# Patient Record
Sex: Male | Born: 1935
Health system: Southern US, Community
[De-identification: ages and names within clinical notes are randomized; demographics above are authoritative.]

## PROBLEM LIST (undated history)

## (undated) DIAGNOSIS — M1990 Unspecified osteoarthritis, unspecified site: Secondary | ICD-10-CM

## (undated) DIAGNOSIS — I495 Sick sinus syndrome: Secondary | ICD-10-CM

## (undated) DIAGNOSIS — IMO0002 Reserved for concepts with insufficient information to code with codable children: Secondary | ICD-10-CM

## (undated) DIAGNOSIS — I809 Phlebitis and thrombophlebitis of unspecified site: Secondary | ICD-10-CM

## (undated) DIAGNOSIS — R351 Nocturia: Secondary | ICD-10-CM

## (undated) DIAGNOSIS — I4719 Other supraventricular tachycardia: Secondary | ICD-10-CM

## (undated) DIAGNOSIS — M4800 Spinal stenosis, site unspecified: Secondary | ICD-10-CM

## (undated) DIAGNOSIS — M419 Scoliosis, unspecified: Secondary | ICD-10-CM

## (undated) DIAGNOSIS — I471 Supraventricular tachycardia: Secondary | ICD-10-CM

## (undated) DIAGNOSIS — I480 Paroxysmal atrial fibrillation: Secondary | ICD-10-CM

## (undated) DIAGNOSIS — K219 Gastro-esophageal reflux disease without esophagitis: Secondary | ICD-10-CM

## (undated) HISTORY — DX: Nocturia: R35.1

## (undated) HISTORY — DX: Phlebitis and thrombophlebitis of unspecified site: I80.9

## (undated) HISTORY — DX: Spinal stenosis, site unspecified: M48.00

## (undated) HISTORY — DX: Gastro-esophageal reflux disease without esophagitis: K21.9

## (undated) HISTORY — DX: Reserved for concepts with insufficient information to code with codable children: IMO0002

## (undated) HISTORY — DX: Scoliosis, unspecified: M41.9

## (undated) HISTORY — DX: Paroxysmal atrial fibrillation: I48.0

## (undated) HISTORY — DX: Sick sinus syndrome: I49.5

## (undated) HISTORY — DX: Other supraventricular tachycardia: I47.19

## (undated) HISTORY — DX: Supraventricular tachycardia: I47.1

## (undated) HISTORY — DX: Unspecified osteoarthritis, unspecified site: M19.90

---

## 1990-09-08 HISTORY — PX: INGUINAL HERNIA REPAIR: SHX194

## 2006-04-21 ENCOUNTER — Inpatient Hospital Stay: Payer: Self-pay | Admitting: General Practice

## 2006-04-26 ENCOUNTER — Encounter: Payer: Self-pay | Admitting: Internal Medicine

## 2006-05-01 ENCOUNTER — Emergency Department: Payer: Self-pay | Admitting: Emergency Medicine

## 2006-05-09 ENCOUNTER — Encounter: Payer: Self-pay | Admitting: Internal Medicine

## 2006-06-08 ENCOUNTER — Encounter: Payer: Self-pay | Admitting: Internal Medicine

## 2006-07-09 ENCOUNTER — Encounter: Payer: Self-pay | Admitting: Internal Medicine

## 2007-07-29 ENCOUNTER — Emergency Department (HOSPITAL_COMMUNITY): Admission: EM | Admit: 2007-07-29 | Discharge: 2007-07-29 | Payer: Self-pay | Admitting: Emergency Medicine

## 2010-04-05 ENCOUNTER — Ambulatory Visit: Payer: Self-pay | Admitting: Cardiology

## 2010-04-06 ENCOUNTER — Inpatient Hospital Stay (HOSPITAL_COMMUNITY): Admission: EM | Admit: 2010-04-06 | Discharge: 2010-04-10 | Payer: Self-pay | Admitting: Emergency Medicine

## 2010-04-07 ENCOUNTER — Encounter: Payer: Self-pay | Admitting: Cardiology

## 2010-04-08 ENCOUNTER — Ambulatory Visit: Payer: Self-pay | Admitting: Cardiology

## 2010-04-08 HISTORY — PX: PACEMAKER INSERTION: SHX728

## 2010-04-22 ENCOUNTER — Ambulatory Visit: Payer: Self-pay | Admitting: Cardiology

## 2010-05-24 ENCOUNTER — Ambulatory Visit: Payer: Self-pay | Admitting: Cardiovascular Disease

## 2010-07-06 ENCOUNTER — Encounter: Payer: Self-pay | Admitting: Internal Medicine

## 2010-08-07 DIAGNOSIS — I495 Sick sinus syndrome: Secondary | ICD-10-CM | POA: Insufficient documentation

## 2010-08-07 DIAGNOSIS — I4891 Unspecified atrial fibrillation: Secondary | ICD-10-CM | POA: Insufficient documentation

## 2010-08-27 ENCOUNTER — Ambulatory Visit: Payer: Self-pay | Admitting: Cardiology

## 2010-10-08 NOTE — Miscellaneous (Signed)
Summary: Device preload  Clinical Lists Changes  Observations: Added new observation of PPM INDICATN: Sick sinus syndrome (03-26-2010 11:57) Added new observation of MAGNET RTE: BOL 85 ERI 65 (03-26-2010 11:57) Added new observation of PPMLEADSTAT2: active (03-26-2010 11:57) Added new observation of PPMLEADSER2: [id number] (03-26-2010 11:57) Added new observation of PPMLEADMOD2: 5086  (03-26-2010 11:57) Added new observation of PPMLEADDOI2: 04/09/2010  (03-26-2010 11:57) Added new observation of PPMLEADLOC2: RV  (03-26-2010 11:57) Added new observation of PPMLEADSTAT1: active  (03-26-2010 11:57) Added new observation of PPMLEADSER1: [id number]  (03-26-2010 11:57) Added new observation of PPMLEADMOD1: 5086  (03-26-2010 11:57) Added new observation of PPMLEADDOI1: 04/09/2010  (03-26-2010 11:57) Added new observation of PPMLEADLOC1: RA  (03-26-2010 11:57) Added new observation of PPM DOI: 04/09/2010  (03-26-2010 11:57) Added new observation of PPM SERL#: [id number]  (03-26-2010 11:57) Added new observation of PPM MODL#: [id number]  (03-26-2010 02:54) Added new observation of PACEMAKERMFG: Medtronic  (03-26-2010 11:57) Added new observation of PPM IMP MD: Duffy Rhody Tennant,MD  (03-26-2010 11:57) Added new observation of PPM REFER MD: Rolla Plate  (03-26-2010 11:57) Added new observation of PACEMAKER MD: Hillis Range, MD  (03-26-2010 11:57)      PPM Specifications Following MD:  Hillis Range, MD     Referring MD:  Rolla Plate PPM Vendor:  Medtronic     PPM Model Number:  [id number]     PPM Serial Number:  [id number] PPM DOI:  04/09/2010     PPM Implanting MD:  Rolla Plate  Lead 1    Location: RA     DOI: 04/09/2010     Model #: [id number]     Serial #: [id number]     Status: active Lead 2    Location: RV     DOI: 04/09/2010     Model #: [id number]     Serial #: [id number]     Status: active  Magnet Response Rate:  BOL 85 ERI 65  Indications:  Sick sinus syndrome

## 2010-11-06 ENCOUNTER — Encounter: Payer: Self-pay | Admitting: Internal Medicine

## 2010-11-06 ENCOUNTER — Other Ambulatory Visit: Payer: Self-pay | Admitting: Internal Medicine

## 2010-11-06 ENCOUNTER — Encounter (INDEPENDENT_AMBULATORY_CARE_PROVIDER_SITE_OTHER): Payer: Medicare Other | Admitting: Internal Medicine

## 2010-11-06 ENCOUNTER — Ambulatory Visit (INDEPENDENT_AMBULATORY_CARE_PROVIDER_SITE_OTHER)
Admission: RE | Admit: 2010-11-06 | Discharge: 2010-11-06 | Disposition: A | Discharge status: Home / Self Care | Payer: Medicare Other | Source: Ambulatory Visit | Attending: Internal Medicine | Admitting: Internal Medicine

## 2010-11-06 DIAGNOSIS — T82897A Other specified complication of cardiac prosthetic devices, implants and grafts, initial encounter: Secondary | ICD-10-CM

## 2010-11-06 DIAGNOSIS — I495 Sick sinus syndrome: Secondary | ICD-10-CM

## 2010-11-06 DIAGNOSIS — I10 Essential (primary) hypertension: Secondary | ICD-10-CM | POA: Insufficient documentation

## 2010-11-06 DIAGNOSIS — I4892 Unspecified atrial flutter: Secondary | ICD-10-CM

## 2010-11-07 ENCOUNTER — Encounter: Payer: Self-pay | Admitting: Internal Medicine

## 2010-11-14 NOTE — Assessment & Plan Note (Signed)
Summary: PC2/GD/PER PT CALL/GD/RS FROM BUMPLIST/MB/KL   Visit Type:  Initial Consult Referring Provider:  Dr Deborah Chalk   History of Present Illness: [name] is a pleasant 77 yo WM with a h/o paroxysmal atrial fibrillation, atrial tachycardia, and tachy/brady syndrome s/p PPM (MDT) by Dr Deborah Chalk 04/08/10 who presents today to establish care in the EP clinic. He initially presented 8/11 with atrial tachycardia 120s with subsequent sinus rhythm with heart rates in the 50s.  It was felt that rate control for atrial tachycardia and atrial fibrillation required pacemaker backup.  He therefore underwent PPM implantation.   He was then scheduled to see me 08/07/10 but did not show up for his appointment.  He now presents without new complaints.  His primary concern remains chronic back pain which he attributes to spinal stenosis.  He denies chest pain, SOB, palpitations, dizziness, presyncope, or syncope.   Problems Prior to Update: 1)  Atrial Fibrillation  (ICD-427.31) 2)  Bradycardia-tachycardia Syndrome  (ICD-427.81)  Current Medications (verified): 1)  Multi Vtamin .... Twice Daily 2)  Vitamin C .... Twice Daily 3)  Vitamin D .... Once Daily 4)  Vitamin E .... Once Daily 5)  Colchew 10 .... Twice Daily  Allergies (verified): No Known Drug Allergies  Past History:  Past Medical History: PAROXYSMAL ATRIAL FIBRILLATION (ICD-427.31) ATRIAL TACHYCARDIA BRADYCARDIA-TACHYCARDIA SYNDROME (ICD-427.81) SPINAL STENOSIS DJD/DDD Scoliosis Phlebitis GERD Nocturia  Past Surgical History: R inguinal hernia surgery 1992 PPM (MDT Revo) implanted by Dr Deborah Chalk 04/08/10  Family History: cancer  Social History: Retired Optician, dispensing. Tobacco Use - No.  Alcohol Use - no Drug Use - no  Review of Systems       All systems are reviewed and negative except as listed in the HPI.   Vital Signs:  Patient profile:   77 year old male Height:      69 inches Weight:      167.50 pounds BMI:      24.82 Pulse rate:   60 / minute BP sitting:   142 / 70  (left arm) Cuff size:   regular  Vitals Entered By: Micki Riley CNA (November 06, 2010 12:45 PM)  Physical Exam  General:  Well developed, well nourished, in no acute distress. Head:  normocephalic and atraumatic Eyes:  PERRLA/EOM intact; conjunctiva and lids normal. Mouth:  Teeth, gums and palate normal. Oral mucosa normal. Neck:  supple Chest Wall:  R sided pacemaker pocket is well healed Lungs:  Clear bilaterally to auscultation and percussion. Heart:  RRR, no m/r/g Abdomen:  Bowel sounds positive; abdomen soft and non-tender without masses, organomegaly, or hernias noted. No hepatosplenomegaly. Msk:  Back normal, normal gait. Muscle strength and tone normal. Extremities:  No clubbing or cyanosis. Neurologic:  Alert and oriented x 3. Skin:  Intact without lesions or rashes. Psych:  Normal affect.   EKG  Procedure date:  11/06/2010  Findings:      A paced rhythm 60 bpm, PR 216, QRS 92, Qtc 460, LVH nonspecific ST/T changes  PPM Specifications Following MD:  Donald Range, MD     Referring MD:  Rolla Plate PPM Vendor:  Medtronic     PPM Model Number:  [id number]     PPM Serial Number:  [id number] PPM DOI:  04/09/2010     PPM Implanting MD:  Rolla Plate  Lead 1    Location: RA     DOI: 04/09/2010     Model #: [id number]     Serial #: [id number]     Status:  active Lead 2    Location: RV     DOI: 04/09/2010     Model #: [id number]     Serial #: [id number]     Status: active  Magnet Response Rate:  BOL 85 ERI 65  Indications:  Sick sinus syndrome  MD Comments:  see scanned report  Impression & Recommendations:  Problem # 1:  BRADYCARDIA-TACHYCARDIA SYNDROME (ICD-427.81) The patient is s/p PPM for tachy/brady syndrome.  He had atrial tachycardia (120s) with subsequent sinus rhythm and HR 50s 04/08/10 prompting pacemaker implantation.  He does not appear to have a history of AV block.  By ekg today, his AV conduction  appears to be preserved.  Unfortunately, his ventricular lead has dislodged, resulting in poor R wave sensing and an inability to capture when pacing from the V lead.  At this point, the patient would like to avoid reoperation if able.  I have therefore reprogrammed him AAIR today.  We will obtain a CXR to evaluate his RV lead position.  IF the lead position is stable, then I think that we should keep him AAIR and avoid lead revision if possible.  I have discussed the patient's situation with Dr Deborah Chalk who agrees to this initial approach.  See scanned pacemaker report  Problem # 2:  ATRIAL FIBRILLATION (ICD-427.31) maintaining sinus rhythm, he has had short episodes of atrial tachycardia (<1%) pt not felt by Dr Deborah Chalk to be a candidate for coumadin Qtc is stable on sotalol today  no change  Problem # 3:  ESSENTIAL HYPERTENSION, BENIGN (ICD-401.1) stable no changes today  Other Orders: T-2 View CXR (71020TC)  Patient Instructions: 1)  Your physician wants you to follow-up in: 2 months with Dr Jacquiline Doe will receive a reminder letter in the mail two months in advance. If you don't receive a letter, please call our office to schedule the follow-up appointment. 2)  CXR at [facility] today

## 2010-11-23 LAB — CBC
Hemoglobin: 11.8 g/dL — ABNORMAL LOW (ref 13.0–17.0)
Hemoglobin: 12.8 g/dL — ABNORMAL LOW (ref 13.0–17.0)
MCH: 34.2 pg — ABNORMAL HIGH (ref 26.0–34.0)
MCH: 34.3 pg — ABNORMAL HIGH (ref 26.0–34.0)
MCHC: 33.8 g/dL (ref 30.0–36.0)
MCV: 101.5 fL — ABNORMAL HIGH (ref 78.0–100.0)
RBC: 3.46 MIL/uL — ABNORMAL LOW (ref 4.22–5.81)
RBC: 3.73 MIL/uL — ABNORMAL LOW (ref 4.22–5.81)
RDW: 13.3 % (ref 11.5–15.5)
RDW: 13.7 % (ref 11.5–15.5)
WBC: 7.8 10*3/uL (ref 4.0–10.5)

## 2010-11-23 LAB — DIFFERENTIAL
Basophils Relative: 1 % (ref 0–1)
Eosinophils Absolute: 0.2 10*3/uL (ref 0.0–0.7)
Eosinophils Relative: 3 % (ref 0–5)
Lymphs Abs: 2.1 10*3/uL (ref 0.7–4.0)
Monocytes Absolute: 1 10*3/uL (ref 0.1–1.0)
Neutro Abs: 4.4 10*3/uL (ref 1.7–7.7)

## 2010-11-23 LAB — COMPREHENSIVE METABOLIC PANEL
Albumin: 3.5 g/dL (ref 3.5–5.2)
Alkaline Phosphatase: 68 U/L (ref 39–117)
BUN: 19 mg/dL (ref 6–23)
Calcium: 9.5 mg/dL (ref 8.4–10.5)
Creatinine, Ser: 0.82 mg/dL (ref 0.4–1.5)
GFR calc non Af Amer: >60 mL/min (ref >60)
Potassium: 3.7 mEq/L (ref 3.5–5.1)
Total Bilirubin: 0.5 mg/dL (ref 0.3–1.2)
Total Protein: 6.2 g/dL (ref 6.0–8.3)

## 2010-11-23 LAB — POCT CARDIAC MARKERS
Myoglobin, poc: 86 ng/mL (ref 12–200)
Troponin i, poc: <0.05 ng/mL (ref 0.00–0.09)

## 2010-11-23 LAB — POCT I-STAT, CHEM 8
Calcium, Ion: 1.25 mmol/L (ref 1.12–1.32)
Glucose, Bld: 106 mg/dL — ABNORMAL HIGH (ref 70–99)
Potassium: 3.9 mEq/L (ref 3.5–5.1)

## 2010-11-26 NOTE — Cardiovascular Report (Signed)
Summary: Office Visit   Office Visit   Imported By: Roderic Ovens 11/18/2010 14:31:07  _____________________________________________________________________  External Attachment:    Type:   Image     Comment:   External Document

## 2011-01-04 ENCOUNTER — Encounter: Payer: Self-pay | Admitting: Internal Medicine

## 2011-01-06 ENCOUNTER — Encounter: Payer: Self-pay | Admitting: Internal Medicine

## 2011-01-06 ENCOUNTER — Ambulatory Visit (INDEPENDENT_AMBULATORY_CARE_PROVIDER_SITE_OTHER): Payer: Medicare Other | Admitting: Internal Medicine

## 2011-01-06 DIAGNOSIS — I10 Essential (primary) hypertension: Secondary | ICD-10-CM

## 2011-01-06 DIAGNOSIS — I495 Sick sinus syndrome: Secondary | ICD-10-CM

## 2011-01-06 DIAGNOSIS — I4891 Unspecified atrial fibrillation: Secondary | ICD-10-CM

## 2011-01-06 NOTE — Assessment & Plan Note (Signed)
Controlled He takes ASA for stroke prevention.  He declines coumadin at this time.  He is willing to consider coumadin if his afib burden increases.

## 2011-01-06 NOTE — Assessment & Plan Note (Signed)
Stable s/p PPM His RV lead has dislodged, though he has no symptoms of perforation.  His pacing indication was sinus node dysfunction with tachy/brady syndrome.  His AV conduction is intact.  He has done well programmed AAIR.  We will therefore (per pts request) plan to avoid pacemaker lead revision longterm.

## 2011-01-06 NOTE — Assessment & Plan Note (Signed)
At goal. No changes.  

## 2011-01-06 NOTE — Patient Instructions (Signed)
Your physician wants you to follow-up in: 6 months in the device clinic You will receive a reminder letter in the mail two months in advance. If you don't receive a letter, please call our office to schedule the follow-up appointment.  

## 2011-01-06 NOTE — Progress Notes (Signed)
The patient presents today for routine electrophysiology followup.  Since last being seen in our clinic, the patient reports doing very well.  Today, he denies symptoms of palpitations, chest pain, shortness of breath, orthopnea, PND, lower extremity edema, dizziness, presyncope, syncope, or neurologic sequela.  The patient feels that he is tolerating medications without difficulties and is otherwise without complaint today.   Past Medical History  Diagnosis Date  . PAF (paroxysmal atrial fibrillation)   . Atrial tachycardia   . Tachycardia-bradycardia syndrome     s/p PPM by ST 04/08/10, RV lead has dislodged but  appears stable AAIR  . Spinal stenosis   . DJD (degenerative joint disease)   . DDD (degenerative disc disease)   . Scoliosis   . Phlebitis   . GERD (gastroesophageal reflux disease)   . Nocturia    Past Surgical History  Procedure Date  . Inguinal hernia repair 1992  . Pacemaker insertion 04/08/10    Medtronic - Revo - implanted by Dr. Deborah Chalk 04/08/10  RV lead has dislodged, though pt is stable AAIR    Current Outpatient Prescriptions  Medication Sig Dispense Refill  . aspirin 81 MG tablet Take 81 mg by mouth daily.        . Coenzyme Q10 (CO Q 10 PO) Take by mouth 2 (two) times daily.        . Multiple Vitamin (MULTIVITAMIN) tablet Take 1 tablet by mouth 2 (two) times daily.        . NON FORMULARY daily. Vitamin C - Twice Daily      . NON FORMULARY Vitamin D - Once Daily       . NON FORMULARY Vitamin E - Once Daily       . DISCONTD: NON FORMULARY COLCHEW 10 - Twice daily        No Known Allergies  History   Social History  . Marital Status: Married    Spouse Name: N/A    Number of Children: N/A  . Years of Education: N/A   Occupational History  . Retired Optician, dispensing    Social History Main Topics  . Smoking status: Never Smoker   . Smokeless tobacco: Not on file  . Alcohol Use: No  . Drug Use: No  . Sexually Active: Not on file   Other Topics Concern  . Not  on file   Social History Narrative   Retired Optician, dispensing.    Family History  Problem Relation Age of Onset  . Cancer     Physical Exam: Filed Vitals:   01/06/11 1442  BP: 120/70  Pulse: 76  Height: 5\' 10"  (1.778 m)  Weight: 167 lb (75.751 kg)    GEN- The patient is well appearing, alert and oriented x 3 today.   Head- normocephalic, atraumatic Eyes-  Sclera clear, conjunctiva pink Ears- hearing intact Oropharynx- clear Neck- supple, no JVP Lymph- no cervical lymphadenopathy Lungs- Clear to ausculation bilaterally, normal work of breathing Chest- pacemaker pocket is well healed Heart- Regular rate and rhythm, no murmurs, rubs or gallops, PMI not laterally displaced GI- soft, NT, ND, + BS Extremities- no clubbing, cyanosis, or edema MS- no significant deformity or atrophy Skin- no rash or lesion Psych- euthymic mood, full affect Neuro- strength and sensation are intact  Pacemaker interrogation- reviewed in detail today,  See PACEART report  Assessment and Plan:

## 2011-05-17 ENCOUNTER — Other Ambulatory Visit: Payer: Self-pay | Admitting: Cardiology

## 2011-05-19 NOTE — Telephone Encounter (Signed)
Refilled Meds from fax  

## 2011-06-12 ENCOUNTER — Other Ambulatory Visit: Payer: Self-pay | Admitting: Cardiology

## 2011-08-22 ENCOUNTER — Ambulatory Visit (INDEPENDENT_AMBULATORY_CARE_PROVIDER_SITE_OTHER): Payer: Medicare Other | Admitting: Internal Medicine

## 2011-08-22 ENCOUNTER — Encounter: Payer: Self-pay | Admitting: Internal Medicine

## 2011-08-22 DIAGNOSIS — I495 Sick sinus syndrome: Secondary | ICD-10-CM

## 2011-08-22 DIAGNOSIS — I4891 Unspecified atrial fibrillation: Secondary | ICD-10-CM

## 2011-08-22 LAB — PACEMAKER DEVICE OBSERVATION
ATRIAL PACING PM: 92.8
BATTERY VOLTAGE: 3 V
BMOD-0002RA: 7
BMOD-0003RA: 30
BMOD-0004RA: 5
BRDY-0002RA: 60 {beats}/min
BRDY-0004RA: 130 {beats}/min

## 2011-08-22 NOTE — Assessment & Plan Note (Signed)
Controlled He takes ASA for stroke prevention.  He declines coumadin, xarelto, and pradaxa. at this time.

## 2011-08-22 NOTE — Patient Instructions (Signed)
Your physician wants you to follow-up in: 6 months with Dr. Allred. You will receive a reminder letter in the mail two months in advance. If you don't receive a letter, please call our office to schedule the follow-up appointment.  

## 2011-08-22 NOTE — Progress Notes (Signed)
The patient presents today for routine electrophysiology followup.  Since last being seen in our clinic, the patient reports doing very well.  He remains very active within his church. Today, he denies symptoms of palpitations, chest pain, shortness of breath, orthopnea, PND, lower extremity edema, dizziness, presyncope, syncope, or neurologic sequela.  The patient feels that he is tolerating medications without difficulties and is otherwise without complaint today.   Past Medical History  Diagnosis Date  . PAF (paroxysmal atrial fibrillation)   . Atrial tachycardia   . Tachycardia-bradycardia syndrome     s/p PPM by ST 04/08/10, RV lead has dislodged but  appears stable AAIR  . Spinal stenosis   . DJD (degenerative joint disease)   . DDD (degenerative disc disease)   . Scoliosis   . Phlebitis   . GERD (gastroesophageal reflux disease)   . Nocturia    Past Surgical History  Procedure Date  . Inguinal hernia repair 1992  . Pacemaker insertion 04/08/10    Medtronic - Revo - implanted by Dr. Deborah Chalk 04/08/10  RV lead has dislodged, though pt is stable AAIR    Current Outpatient Prescriptions  Medication Sig Dispense Refill  . aspirin 81 MG tablet Take 81 mg by mouth daily.        . Coenzyme Q10 (CO Q 10 PO) Take 100 mg by mouth 2 (two) times daily.       . fish oil-omega-3 fatty acids 1000 MG capsule Take 2 g by mouth daily.        Marland Kitchen ibuprofen (ADVIL,MOTRIN) 800 MG tablet Take 800 mg by mouth 2 (two) times daily.        . Multiple Vitamin (MULTIVITAMIN) tablet Take 1 tablet by mouth 2 (two) times daily.        . NON FORMULARY daily. Vitamin C - Twice Daily      . NON FORMULARY Vitamin D - Once Daily       . NON FORMULARY Vitamin E - Once Daily       . sotalol (BETAPACE) 160 MG tablet TAKE 1 TABLET TWICE DAILY  180 tablet  3    No Known Allergies  History   Social History  . Marital Status: Married    Spouse Name: N/A    Number of Children: N/A  . Years of Education: N/A    Occupational History  . Retired Optician, dispensing    Social History Main Topics  . Smoking status: Never Smoker   . Smokeless tobacco: Never Used  . Alcohol Use: No  . Drug Use: No  . Sexually Active: Not on file   Other Topics Concern  . Not on file   Social History Narrative   Retired Optician, dispensing.    Family History  Problem Relation Age of Onset  . Cancer     Physical Exam: Filed Vitals:   08/22/11 1216  BP: 142/78  Pulse: 60  Height: 5\' 8"  (1.727 m)  Weight: 164 lb (74.39 kg)    GEN- The patient is well appearing, alert and oriented x 3 today.   Head- normocephalic, atraumatic Eyes-  Sclera clear, conjunctiva pink Ears- hearing intact Oropharynx- clear Neck- supple, no JVP Lymph- no cervical lymphadenopathy Lungs- Clear to ausculation bilaterally, normal work of breathing Chest- pacemaker pocket is well healed Heart- Regular rate and rhythm, no murmurs, rubs or gallops, PMI not laterally displaced GI- soft, NT, ND, + BS Extremities- no clubbing, cyanosis, or edema MS- no significant deformity or atrophy Skin- no rash or lesion  Psych- euthymic mood, full affect Neuro- strength and sensation are intact  Pacemaker interrogation- reviewed in detail today,  See PACEART report  Assessment and Plan:

## 2011-08-22 NOTE — Assessment & Plan Note (Signed)
Doing very well S/p MDT Revo PPM by Dr Tennant with RV lead perforation/ dislodgement. Programmed AAIR and having no issues.  He is clear that he does not want lead revision. Prior Echo OK. No changes today.   

## 2011-08-29 ENCOUNTER — Encounter: Payer: Self-pay | Admitting: Internal Medicine

## 2011-08-29 NOTE — Progress Notes (Signed)
Addended by: Dennis Bast F on: 08/29/2011 08:33 AM   Modules accepted: Orders

## 2012-04-07 ENCOUNTER — Ambulatory Visit (INDEPENDENT_AMBULATORY_CARE_PROVIDER_SITE_OTHER): Payer: Medicare Other | Admitting: *Deleted

## 2012-04-07 ENCOUNTER — Encounter: Payer: Self-pay | Admitting: Internal Medicine

## 2012-04-07 DIAGNOSIS — I495 Sick sinus syndrome: Secondary | ICD-10-CM

## 2012-04-07 LAB — PACEMAKER DEVICE OBSERVATION
AL AMPLITUDE: 0.4 mv
ATRIAL PACING PM: 94.6
BRDY-0002RA: 60 {beats}/min

## 2012-04-07 NOTE — Progress Notes (Signed)
PPM check 

## 2012-04-09 DIAGNOSIS — B351 Tinea unguium: Secondary | ICD-10-CM | POA: Diagnosis not present

## 2012-04-09 DIAGNOSIS — M79609 Pain in unspecified limb: Secondary | ICD-10-CM | POA: Diagnosis not present

## 2012-04-20 DIAGNOSIS — M5030 Other cervical disc degeneration, unspecified cervical region: Secondary | ICD-10-CM | POA: Diagnosis not present

## 2012-04-20 DIAGNOSIS — R51 Headache: Secondary | ICD-10-CM | POA: Diagnosis not present

## 2012-04-20 DIAGNOSIS — M542 Cervicalgia: Secondary | ICD-10-CM | POA: Diagnosis not present

## 2012-04-20 DIAGNOSIS — M9981 Other biomechanical lesions of cervical region: Secondary | ICD-10-CM | POA: Diagnosis not present

## 2012-04-29 DIAGNOSIS — M5030 Other cervical disc degeneration, unspecified cervical region: Secondary | ICD-10-CM | POA: Diagnosis not present

## 2012-04-29 DIAGNOSIS — R51 Headache: Secondary | ICD-10-CM | POA: Diagnosis not present

## 2012-04-29 DIAGNOSIS — M542 Cervicalgia: Secondary | ICD-10-CM | POA: Diagnosis not present

## 2012-04-29 DIAGNOSIS — M9981 Other biomechanical lesions of cervical region: Secondary | ICD-10-CM | POA: Diagnosis not present

## 2012-05-03 DIAGNOSIS — M5030 Other cervical disc degeneration, unspecified cervical region: Secondary | ICD-10-CM | POA: Diagnosis not present

## 2012-05-03 DIAGNOSIS — M9981 Other biomechanical lesions of cervical region: Secondary | ICD-10-CM | POA: Diagnosis not present

## 2012-05-03 DIAGNOSIS — R51 Headache: Secondary | ICD-10-CM | POA: Diagnosis not present

## 2012-05-03 DIAGNOSIS — M542 Cervicalgia: Secondary | ICD-10-CM | POA: Diagnosis not present

## 2012-05-06 DIAGNOSIS — M9981 Other biomechanical lesions of cervical region: Secondary | ICD-10-CM | POA: Diagnosis not present

## 2012-05-06 DIAGNOSIS — M5030 Other cervical disc degeneration, unspecified cervical region: Secondary | ICD-10-CM | POA: Diagnosis not present

## 2012-05-06 DIAGNOSIS — R51 Headache: Secondary | ICD-10-CM | POA: Diagnosis not present

## 2012-05-06 DIAGNOSIS — M542 Cervicalgia: Secondary | ICD-10-CM | POA: Diagnosis not present

## 2012-05-13 ENCOUNTER — Other Ambulatory Visit: Payer: Self-pay | Admitting: Cardiology

## 2012-05-13 DIAGNOSIS — M542 Cervicalgia: Secondary | ICD-10-CM | POA: Diagnosis not present

## 2012-05-13 DIAGNOSIS — M5030 Other cervical disc degeneration, unspecified cervical region: Secondary | ICD-10-CM | POA: Diagnosis not present

## 2012-05-13 DIAGNOSIS — R51 Headache: Secondary | ICD-10-CM | POA: Diagnosis not present

## 2012-05-13 DIAGNOSIS — M9981 Other biomechanical lesions of cervical region: Secondary | ICD-10-CM | POA: Diagnosis not present

## 2012-05-20 DIAGNOSIS — R51 Headache: Secondary | ICD-10-CM | POA: Diagnosis not present

## 2012-05-20 DIAGNOSIS — M542 Cervicalgia: Secondary | ICD-10-CM | POA: Diagnosis not present

## 2012-05-20 DIAGNOSIS — M5030 Other cervical disc degeneration, unspecified cervical region: Secondary | ICD-10-CM | POA: Diagnosis not present

## 2012-05-20 DIAGNOSIS — M9981 Other biomechanical lesions of cervical region: Secondary | ICD-10-CM | POA: Diagnosis not present

## 2012-05-25 DIAGNOSIS — K219 Gastro-esophageal reflux disease without esophagitis: Secondary | ICD-10-CM | POA: Diagnosis not present

## 2012-05-25 DIAGNOSIS — R197 Diarrhea, unspecified: Secondary | ICD-10-CM | POA: Diagnosis not present

## 2012-05-27 DIAGNOSIS — R51 Headache: Secondary | ICD-10-CM | POA: Diagnosis not present

## 2012-05-27 DIAGNOSIS — M542 Cervicalgia: Secondary | ICD-10-CM | POA: Diagnosis not present

## 2012-05-27 DIAGNOSIS — M9981 Other biomechanical lesions of cervical region: Secondary | ICD-10-CM | POA: Diagnosis not present

## 2012-05-27 DIAGNOSIS — M5030 Other cervical disc degeneration, unspecified cervical region: Secondary | ICD-10-CM | POA: Diagnosis not present

## 2012-06-03 DIAGNOSIS — M9981 Other biomechanical lesions of cervical region: Secondary | ICD-10-CM | POA: Diagnosis not present

## 2012-06-03 DIAGNOSIS — M542 Cervicalgia: Secondary | ICD-10-CM | POA: Diagnosis not present

## 2012-06-03 DIAGNOSIS — R51 Headache: Secondary | ICD-10-CM | POA: Diagnosis not present

## 2012-06-03 DIAGNOSIS — M5030 Other cervical disc degeneration, unspecified cervical region: Secondary | ICD-10-CM | POA: Diagnosis not present

## 2012-06-08 DIAGNOSIS — R51 Headache: Secondary | ICD-10-CM | POA: Diagnosis not present

## 2012-06-08 DIAGNOSIS — M9981 Other biomechanical lesions of cervical region: Secondary | ICD-10-CM | POA: Diagnosis not present

## 2012-06-08 DIAGNOSIS — M542 Cervicalgia: Secondary | ICD-10-CM | POA: Diagnosis not present

## 2012-06-08 DIAGNOSIS — M5030 Other cervical disc degeneration, unspecified cervical region: Secondary | ICD-10-CM | POA: Diagnosis not present

## 2012-06-15 DIAGNOSIS — M542 Cervicalgia: Secondary | ICD-10-CM | POA: Diagnosis not present

## 2012-06-15 DIAGNOSIS — M5030 Other cervical disc degeneration, unspecified cervical region: Secondary | ICD-10-CM | POA: Diagnosis not present

## 2012-06-15 DIAGNOSIS — M9981 Other biomechanical lesions of cervical region: Secondary | ICD-10-CM | POA: Diagnosis not present

## 2012-06-15 DIAGNOSIS — R51 Headache: Secondary | ICD-10-CM | POA: Diagnosis not present

## 2012-06-24 DIAGNOSIS — R51 Headache: Secondary | ICD-10-CM | POA: Diagnosis not present

## 2012-06-24 DIAGNOSIS — M5030 Other cervical disc degeneration, unspecified cervical region: Secondary | ICD-10-CM | POA: Diagnosis not present

## 2012-06-24 DIAGNOSIS — M9981 Other biomechanical lesions of cervical region: Secondary | ICD-10-CM | POA: Diagnosis not present

## 2012-06-24 DIAGNOSIS — M542 Cervicalgia: Secondary | ICD-10-CM | POA: Diagnosis not present

## 2012-07-01 DIAGNOSIS — M5030 Other cervical disc degeneration, unspecified cervical region: Secondary | ICD-10-CM | POA: Diagnosis not present

## 2012-07-01 DIAGNOSIS — M542 Cervicalgia: Secondary | ICD-10-CM | POA: Diagnosis not present

## 2012-07-01 DIAGNOSIS — M9981 Other biomechanical lesions of cervical region: Secondary | ICD-10-CM | POA: Diagnosis not present

## 2012-07-01 DIAGNOSIS — R51 Headache: Secondary | ICD-10-CM | POA: Diagnosis not present

## 2012-07-06 DIAGNOSIS — M9981 Other biomechanical lesions of cervical region: Secondary | ICD-10-CM | POA: Diagnosis not present

## 2012-07-06 DIAGNOSIS — M542 Cervicalgia: Secondary | ICD-10-CM | POA: Diagnosis not present

## 2012-07-06 DIAGNOSIS — M5030 Other cervical disc degeneration, unspecified cervical region: Secondary | ICD-10-CM | POA: Diagnosis not present

## 2012-07-06 DIAGNOSIS — R51 Headache: Secondary | ICD-10-CM | POA: Diagnosis not present

## 2012-07-15 DIAGNOSIS — M5030 Other cervical disc degeneration, unspecified cervical region: Secondary | ICD-10-CM | POA: Diagnosis not present

## 2012-07-15 DIAGNOSIS — M9981 Other biomechanical lesions of cervical region: Secondary | ICD-10-CM | POA: Diagnosis not present

## 2012-07-15 DIAGNOSIS — M542 Cervicalgia: Secondary | ICD-10-CM | POA: Diagnosis not present

## 2012-07-15 DIAGNOSIS — R51 Headache: Secondary | ICD-10-CM | POA: Diagnosis not present

## 2012-07-22 DIAGNOSIS — R51 Headache: Secondary | ICD-10-CM | POA: Diagnosis not present

## 2012-07-22 DIAGNOSIS — M542 Cervicalgia: Secondary | ICD-10-CM | POA: Diagnosis not present

## 2012-07-22 DIAGNOSIS — M5030 Other cervical disc degeneration, unspecified cervical region: Secondary | ICD-10-CM | POA: Diagnosis not present

## 2012-07-22 DIAGNOSIS — M9981 Other biomechanical lesions of cervical region: Secondary | ICD-10-CM | POA: Diagnosis not present

## 2012-07-27 DIAGNOSIS — M542 Cervicalgia: Secondary | ICD-10-CM | POA: Diagnosis not present

## 2012-07-27 DIAGNOSIS — M9981 Other biomechanical lesions of cervical region: Secondary | ICD-10-CM | POA: Diagnosis not present

## 2012-07-27 DIAGNOSIS — M5030 Other cervical disc degeneration, unspecified cervical region: Secondary | ICD-10-CM | POA: Diagnosis not present

## 2012-07-27 DIAGNOSIS — R51 Headache: Secondary | ICD-10-CM | POA: Diagnosis not present

## 2012-08-02 DIAGNOSIS — M542 Cervicalgia: Secondary | ICD-10-CM | POA: Diagnosis not present

## 2012-08-02 DIAGNOSIS — R51 Headache: Secondary | ICD-10-CM | POA: Diagnosis not present

## 2012-08-02 DIAGNOSIS — M9981 Other biomechanical lesions of cervical region: Secondary | ICD-10-CM | POA: Diagnosis not present

## 2012-08-02 DIAGNOSIS — M5030 Other cervical disc degeneration, unspecified cervical region: Secondary | ICD-10-CM | POA: Diagnosis not present

## 2012-08-04 ENCOUNTER — Other Ambulatory Visit: Payer: Self-pay

## 2012-08-04 MED ORDER — SOTALOL HCL 160 MG PO TABS: 160.0000 mg | ORAL_TABLET | Freq: Every day | ORAL | Status: DC

## 2012-08-10 DIAGNOSIS — M9981 Other biomechanical lesions of cervical region: Secondary | ICD-10-CM | POA: Diagnosis not present

## 2012-08-10 DIAGNOSIS — M5030 Other cervical disc degeneration, unspecified cervical region: Secondary | ICD-10-CM | POA: Diagnosis not present

## 2012-08-10 DIAGNOSIS — R51 Headache: Secondary | ICD-10-CM | POA: Diagnosis not present

## 2012-08-10 DIAGNOSIS — M542 Cervicalgia: Secondary | ICD-10-CM | POA: Diagnosis not present

## 2012-08-17 DIAGNOSIS — M9981 Other biomechanical lesions of cervical region: Secondary | ICD-10-CM | POA: Diagnosis not present

## 2012-08-17 DIAGNOSIS — M542 Cervicalgia: Secondary | ICD-10-CM | POA: Diagnosis not present

## 2012-08-17 DIAGNOSIS — M5030 Other cervical disc degeneration, unspecified cervical region: Secondary | ICD-10-CM | POA: Diagnosis not present

## 2012-08-17 DIAGNOSIS — R51 Headache: Secondary | ICD-10-CM | POA: Diagnosis not present

## 2012-08-24 DIAGNOSIS — M5030 Other cervical disc degeneration, unspecified cervical region: Secondary | ICD-10-CM | POA: Diagnosis not present

## 2012-08-24 DIAGNOSIS — M9981 Other biomechanical lesions of cervical region: Secondary | ICD-10-CM | POA: Diagnosis not present

## 2012-08-24 DIAGNOSIS — R51 Headache: Secondary | ICD-10-CM | POA: Diagnosis not present

## 2012-08-24 DIAGNOSIS — M542 Cervicalgia: Secondary | ICD-10-CM | POA: Diagnosis not present

## 2012-08-27 DIAGNOSIS — M542 Cervicalgia: Secondary | ICD-10-CM | POA: Diagnosis not present

## 2012-08-27 DIAGNOSIS — M5030 Other cervical disc degeneration, unspecified cervical region: Secondary | ICD-10-CM | POA: Diagnosis not present

## 2012-08-27 DIAGNOSIS — M9981 Other biomechanical lesions of cervical region: Secondary | ICD-10-CM | POA: Diagnosis not present

## 2012-08-27 DIAGNOSIS — R51 Headache: Secondary | ICD-10-CM | POA: Diagnosis not present

## 2012-09-30 DIAGNOSIS — Z1331 Encounter for screening for depression: Secondary | ICD-10-CM | POA: Diagnosis not present

## 2012-09-30 DIAGNOSIS — K219 Gastro-esophageal reflux disease without esophagitis: Secondary | ICD-10-CM | POA: Diagnosis not present

## 2012-09-30 DIAGNOSIS — R197 Diarrhea, unspecified: Secondary | ICD-10-CM | POA: Diagnosis not present

## 2012-10-07 ENCOUNTER — Ambulatory Visit (INDEPENDENT_AMBULATORY_CARE_PROVIDER_SITE_OTHER): Payer: Medicare Other | Admitting: Internal Medicine

## 2012-10-07 ENCOUNTER — Encounter: Payer: Self-pay | Admitting: Internal Medicine

## 2012-10-07 VITALS — BP 146/64 | HR 66 | Ht 68.0 in | Wt 160.1 lb

## 2012-10-07 DIAGNOSIS — I4891 Unspecified atrial fibrillation: Secondary | ICD-10-CM

## 2012-10-07 DIAGNOSIS — I495 Sick sinus syndrome: Secondary | ICD-10-CM | POA: Diagnosis not present

## 2012-10-07 LAB — PACEMAKER DEVICE OBSERVATION
AL AMPLITUDE: 0.4 mv
BMOD-0002RA: 7
BMOD-0004RA: 5
BRDY-0002RA: 60 {beats}/min
BRDY-0004RA: 130 {beats}/min
RV LEAD AMPLITUDE: 1 mv

## 2012-10-07 NOTE — Progress Notes (Signed)
The patient presents today for routine electrophysiology followup.  Since last being seen in our clinic, the patient reports doing very well.  He remains very active within his church and also enjoys watching [employer] play basketball.  he denies symptoms of palpitations, chest pain, shortness of breath, orthopnea, PND, lower extremity edema, dizziness, presyncope, syncope, or neurologic sequela.  The patient feels that he is tolerating medications without difficulties and is otherwise without complaint today.   Past Medical History  Diagnosis Date  . PAF (paroxysmal atrial fibrillation)   . Atrial tachycardia   . Tachycardia-bradycardia syndrome     s/p PPM by ST 04/08/10, RV lead has dislodged but  appears stable AAIR  . Spinal stenosis   . DJD (degenerative joint disease)   . DDD (degenerative disc disease)   . Scoliosis   . Phlebitis   . GERD (gastroesophageal reflux disease)   . Nocturia    Past Surgical History  Procedure Date  . Inguinal hernia repair 1992  . Pacemaker insertion 04/08/10    Medtronic - Revo - implanted by Dr. Deborah Chalk 04/08/10  RV lead has dislodged, though pt is stable AAIR    Current Outpatient Prescriptions  Medication Sig Dispense Refill  . aspirin 81 MG tablet Take 81 mg by mouth daily.        . Coenzyme Q10 (CO Q 10 PO) Take 100 mg by mouth 2 (two) times daily.       . fish oil-omega-3 fatty acids 1000 MG capsule Take 2 g by mouth daily.        Marland Kitchen ibuprofen (ADVIL,MOTRIN) 800 MG tablet Take 800 mg by mouth 2 (two) times daily.        . Multiple Vitamin (MULTIVITAMIN) tablet Take 1 tablet by mouth 2 (two) times daily.        . NON FORMULARY daily. Vitamin C - Twice Daily      . NON FORMULARY Vitamin D - Once Daily       . NON FORMULARY Vitamin E - Once Daily       . sotalol (BETAPACE) 160 MG tablet Take 1 tablet (160 mg total) by mouth daily.  180 tablet  0    No Known Allergies  History   Social History  . Marital Status: Married    Spouse Name: N/A   Number of Children: N/A  . Years of Education: N/A   Occupational History  . Retired Optician, dispensing    Social History Main Topics  . Smoking status: Never Smoker   . Smokeless tobacco: Never Used  . Alcohol Use: No  . Drug Use: No  . Sexually Active: Not on file   Other Topics Concern  . Not on file   Social History Narrative   Retired Optician, dispensing.    Family History  Problem Relation Age of Onset  . Cancer     Physical Exam: Filed Vitals:   10/07/12 1039  BP: 146/64  Pulse: 66  Height: 5\' 8"  (1.727 m)  Weight: 160 lb 1.9 oz (72.63 kg)    GEN- The patient is well appearing, alert and oriented x 3 today.   Head- normocephalic, atraumatic Eyes-  Sclera clear, conjunctiva pink Ears- hearing intact Oropharynx- clear Neck- supple, Lungs- Clear to ausculation bilaterally, normal work of breathing Chest- pacemaker pocket is well healed Heart- Regular rate and rhythm, no murmurs, rubs or gallops, PMI not laterally displaced GI- soft, NT, ND, + BS Extremities- no clubbing, cyanosis, or edema  Pacemaker interrogation- reviewed in detail  today,  See PACEART report  Assessment and Plan:

## 2012-10-07 NOTE — Patient Instructions (Addendum)
Your physician wants you to follow-up in: 6 months with device clinic and 12 months with Dr Allred You will receive a reminder letter in the mail two months in advance. If you don't receive a letter, please call our office to schedule the follow-up appointment.  

## 2012-10-07 NOTE — Assessment & Plan Note (Signed)
Doing very well S/p MDT Revo PPM by Dr Deborah Chalk with RV lead perforation/ dislodgement. Programmed AAIR and having no issues.  He is clear that he does not want lead revision. Prior Echo OK. No changes today.

## 2012-10-07 NOTE — Assessment & Plan Note (Signed)
Controlled He takes ASA for stroke prevention.  He declines coumadin or novel anticoagulants

## 2012-11-02 DIAGNOSIS — Z1211 Encounter for screening for malignant neoplasm of colon: Secondary | ICD-10-CM | POA: Diagnosis not present

## 2012-11-02 DIAGNOSIS — R143 Flatulence: Secondary | ICD-10-CM | POA: Diagnosis not present

## 2012-11-02 DIAGNOSIS — R198 Other specified symptoms and signs involving the digestive system and abdomen: Secondary | ICD-10-CM | POA: Diagnosis not present

## 2012-11-04 ENCOUNTER — Other Ambulatory Visit: Payer: Self-pay | Admitting: *Deleted

## 2012-11-04 MED ORDER — SOTALOL HCL 160 MG PO TABS: 160.0000 mg | ORAL_TABLET | Freq: Every day | ORAL | Status: DC

## 2012-11-16 ENCOUNTER — Telehealth: Payer: Self-pay | Admitting: Internal Medicine

## 2012-11-16 MED ORDER — SOTALOL HCL 160 MG PO TABS: 160.0000 mg | ORAL_TABLET | Freq: Two times a day (BID) | ORAL | Status: DC

## 2012-11-16 NOTE — Telephone Encounter (Signed)
Should be twice daily  Will change and correct his medication list

## 2012-11-16 NOTE — Telephone Encounter (Signed)
Pt questioning dosage of sotalol, was taking two , refill he picked up yesterday said to take one, pls advise

## 2012-12-30 ENCOUNTER — Telehealth: Payer: Self-pay | Admitting: Internal Medicine

## 2012-12-30 NOTE — Telephone Encounter (Signed)
New Problem:    Patient called in wanting to speak with you because he is concerned about having a colostomy tomorrow with his device.  Please call back.

## 2012-12-30 NOTE — Telephone Encounter (Addendum)
Ok to have colonscopy.  Just worried about the SE of the pre-op drink.  I let him know to keep himself hydrated

## 2012-12-30 NOTE — Telephone Encounter (Signed)
F/u ° ° °Pt returning your call. Please call pt. °

## 2012-12-30 NOTE — Telephone Encounter (Signed)
lmom for patient to return my call 

## 2012-12-31 DIAGNOSIS — K5289 Other specified noninfective gastroenteritis and colitis: Secondary | ICD-10-CM | POA: Diagnosis not present

## 2012-12-31 DIAGNOSIS — R198 Other specified symptoms and signs involving the digestive system and abdomen: Secondary | ICD-10-CM | POA: Diagnosis not present

## 2012-12-31 DIAGNOSIS — K6389 Other specified diseases of intestine: Secondary | ICD-10-CM | POA: Diagnosis not present

## 2012-12-31 DIAGNOSIS — Z1211 Encounter for screening for malignant neoplasm of colon: Secondary | ICD-10-CM | POA: Diagnosis not present

## 2012-12-31 DIAGNOSIS — K6289 Other specified diseases of anus and rectum: Secondary | ICD-10-CM | POA: Diagnosis not present

## 2012-12-31 DIAGNOSIS — K5730 Diverticulosis of large intestine without perforation or abscess without bleeding: Secondary | ICD-10-CM | POA: Diagnosis not present

## 2013-01-03 DIAGNOSIS — L259 Unspecified contact dermatitis, unspecified cause: Secondary | ICD-10-CM | POA: Diagnosis not present

## 2013-01-03 DIAGNOSIS — L570 Actinic keratosis: Secondary | ICD-10-CM | POA: Diagnosis not present

## 2013-01-03 DIAGNOSIS — L578 Other skin changes due to chronic exposure to nonionizing radiation: Secondary | ICD-10-CM | POA: Diagnosis not present

## 2013-02-07 ENCOUNTER — Other Ambulatory Visit: Payer: Self-pay | Admitting: Emergency Medicine

## 2013-02-07 MED ORDER — SOTALOL HCL 160 MG PO TABS: 160.0000 mg | ORAL_TABLET | Freq: Two times a day (BID) | ORAL | Status: DC

## 2013-02-08 ENCOUNTER — Other Ambulatory Visit: Payer: Self-pay

## 2013-02-08 ENCOUNTER — Telehealth: Payer: Self-pay

## 2013-02-08 ENCOUNTER — Other Ambulatory Visit: Payer: Self-pay | Admitting: *Deleted

## 2013-02-08 MED ORDER — SOTALOL HCL 160 MG PO TABS: 160.0000 mg | ORAL_TABLET | Freq: Two times a day (BID) | ORAL | Status: DC

## 2013-02-08 NOTE — Telephone Encounter (Signed)
Approved  Disp Refills Start End  sotalol (BETAPACE) 160 MG tablet 180 tablet 3 02/07/2013    Sig - Route:  Take 1 tablet (160 mg total) by mouth 2 (two) times daily. - Oral  Class:  No Print  Authorizing Provider:  Hillis Range, MD  Ordering User:  [place] N Shannon, CMA  Visit Pharmacy  [facility] [address] [place] - [address]  Created by  Armenia N Shannon, CMA on 02/07/2013 04:45 PM

## 2013-03-21 DIAGNOSIS — B351 Tinea unguium: Secondary | ICD-10-CM | POA: Diagnosis not present

## 2013-03-21 DIAGNOSIS — M79609 Pain in unspecified limb: Secondary | ICD-10-CM | POA: Diagnosis not present

## 2013-04-06 ENCOUNTER — Ambulatory Visit (INDEPENDENT_AMBULATORY_CARE_PROVIDER_SITE_OTHER): Payer: Medicare Other | Admitting: *Deleted

## 2013-04-06 ENCOUNTER — Encounter: Payer: Self-pay | Admitting: Internal Medicine

## 2013-04-06 DIAGNOSIS — Z950 Presence of cardiac pacemaker: Secondary | ICD-10-CM

## 2013-04-06 DIAGNOSIS — I495 Sick sinus syndrome: Secondary | ICD-10-CM | POA: Diagnosis not present

## 2013-04-06 NOTE — Progress Notes (Signed)
Pacer check in clinic.  All functions normal, still set to AAIR. No changes made, full details in PaceArt. ROV Jan/2015 w/ Dr. [name].

## 2013-04-07 LAB — PACEMAKER DEVICE OBSERVATION
AL IMPEDENCE PM: 400 Ohm
ATRIAL PACING PM: 94.25
BATTERY VOLTAGE: 3 V
BRDY-0002RA: 60 {beats}/min
BRDY-0004RA: 130 {beats}/min

## 2013-05-03 ENCOUNTER — Other Ambulatory Visit: Payer: Self-pay | Admitting: Cardiology

## 2013-05-12 ENCOUNTER — Encounter (INDEPENDENT_AMBULATORY_CARE_PROVIDER_SITE_OTHER): Payer: Medicare Other | Admitting: Vascular Surgery

## 2013-05-12 DIAGNOSIS — IMO0002 Reserved for concepts with insufficient information to code with codable children: Secondary | ICD-10-CM | POA: Diagnosis not present

## 2013-05-12 DIAGNOSIS — I82409 Acute embolism and thrombosis of unspecified deep veins of unspecified lower extremity: Secondary | ICD-10-CM | POA: Diagnosis not present

## 2013-05-12 DIAGNOSIS — M7989 Other specified soft tissue disorders: Secondary | ICD-10-CM | POA: Diagnosis not present

## 2013-05-12 DIAGNOSIS — Z79899 Other long term (current) drug therapy: Secondary | ICD-10-CM | POA: Diagnosis not present

## 2013-05-12 DIAGNOSIS — I4891 Unspecified atrial fibrillation: Secondary | ICD-10-CM | POA: Diagnosis not present

## 2013-05-12 DIAGNOSIS — M79609 Pain in unspecified limb: Secondary | ICD-10-CM

## 2013-05-18 DIAGNOSIS — Z79899 Other long term (current) drug therapy: Secondary | ICD-10-CM | POA: Diagnosis not present

## 2013-05-18 DIAGNOSIS — R82998 Other abnormal findings in urine: Secondary | ICD-10-CM | POA: Diagnosis not present

## 2013-05-18 DIAGNOSIS — R972 Elevated prostate specific antigen [PSA]: Secondary | ICD-10-CM | POA: Diagnosis not present

## 2013-05-18 DIAGNOSIS — Z125 Encounter for screening for malignant neoplasm of prostate: Secondary | ICD-10-CM | POA: Diagnosis not present

## 2013-05-25 DIAGNOSIS — I82409 Acute embolism and thrombosis of unspecified deep veins of unspecified lower extremity: Secondary | ICD-10-CM | POA: Diagnosis not present

## 2013-05-25 DIAGNOSIS — K589 Irritable bowel syndrome without diarrhea: Secondary | ICD-10-CM | POA: Diagnosis not present

## 2013-05-25 DIAGNOSIS — K219 Gastro-esophageal reflux disease without esophagitis: Secondary | ICD-10-CM | POA: Diagnosis not present

## 2013-05-25 DIAGNOSIS — I4891 Unspecified atrial fibrillation: Secondary | ICD-10-CM | POA: Diagnosis not present

## 2013-05-25 DIAGNOSIS — Z0000 Encounter for general adult medical examination without abnormal findings: Secondary | ICD-10-CM | POA: Diagnosis not present

## 2013-05-25 DIAGNOSIS — M1990 Unspecified osteoarthritis, unspecified site: Secondary | ICD-10-CM | POA: Diagnosis not present

## 2013-05-25 DIAGNOSIS — Z79899 Other long term (current) drug therapy: Secondary | ICD-10-CM | POA: Diagnosis not present

## 2013-05-25 DIAGNOSIS — R972 Elevated prostate specific antigen [PSA]: Secondary | ICD-10-CM | POA: Diagnosis not present

## 2013-05-25 DIAGNOSIS — Z125 Encounter for screening for malignant neoplasm of prostate: Secondary | ICD-10-CM | POA: Diagnosis not present

## 2013-05-30 DIAGNOSIS — Z1212 Encounter for screening for malignant neoplasm of rectum: Secondary | ICD-10-CM | POA: Diagnosis not present

## 2013-06-02 DIAGNOSIS — I82409 Acute embolism and thrombosis of unspecified deep veins of unspecified lower extremity: Secondary | ICD-10-CM | POA: Diagnosis not present

## 2013-06-02 DIAGNOSIS — I4891 Unspecified atrial fibrillation: Secondary | ICD-10-CM | POA: Diagnosis not present

## 2013-06-02 DIAGNOSIS — Z7901 Long term (current) use of anticoagulants: Secondary | ICD-10-CM | POA: Diagnosis not present

## 2013-06-09 DIAGNOSIS — I82409 Acute embolism and thrombosis of unspecified deep veins of unspecified lower extremity: Secondary | ICD-10-CM | POA: Diagnosis not present

## 2013-06-09 DIAGNOSIS — I4891 Unspecified atrial fibrillation: Secondary | ICD-10-CM | POA: Diagnosis not present

## 2013-06-14 DIAGNOSIS — R972 Elevated prostate specific antigen [PSA]: Secondary | ICD-10-CM | POA: Diagnosis not present

## 2013-06-16 DIAGNOSIS — I82409 Acute embolism and thrombosis of unspecified deep veins of unspecified lower extremity: Secondary | ICD-10-CM | POA: Diagnosis not present

## 2013-06-16 DIAGNOSIS — I4891 Unspecified atrial fibrillation: Secondary | ICD-10-CM | POA: Diagnosis not present

## 2013-06-29 DIAGNOSIS — I4891 Unspecified atrial fibrillation: Secondary | ICD-10-CM | POA: Diagnosis not present

## 2013-06-29 DIAGNOSIS — I82409 Acute embolism and thrombosis of unspecified deep veins of unspecified lower extremity: Secondary | ICD-10-CM | POA: Diagnosis not present

## 2013-06-29 DIAGNOSIS — Z7901 Long term (current) use of anticoagulants: Secondary | ICD-10-CM | POA: Diagnosis not present

## 2013-07-05 DIAGNOSIS — Z85828 Personal history of other malignant neoplasm of skin: Secondary | ICD-10-CM | POA: Diagnosis not present

## 2013-07-05 DIAGNOSIS — L570 Actinic keratosis: Secondary | ICD-10-CM | POA: Diagnosis not present

## 2013-07-13 DIAGNOSIS — I4891 Unspecified atrial fibrillation: Secondary | ICD-10-CM | POA: Diagnosis not present

## 2013-07-13 DIAGNOSIS — I82409 Acute embolism and thrombosis of unspecified deep veins of unspecified lower extremity: Secondary | ICD-10-CM | POA: Diagnosis not present

## 2013-07-27 DIAGNOSIS — I82409 Acute embolism and thrombosis of unspecified deep veins of unspecified lower extremity: Secondary | ICD-10-CM | POA: Diagnosis not present

## 2013-07-27 DIAGNOSIS — I4891 Unspecified atrial fibrillation: Secondary | ICD-10-CM | POA: Diagnosis not present

## 2013-07-27 DIAGNOSIS — Z7901 Long term (current) use of anticoagulants: Secondary | ICD-10-CM | POA: Diagnosis not present

## 2013-08-10 DIAGNOSIS — I82409 Acute embolism and thrombosis of unspecified deep veins of unspecified lower extremity: Secondary | ICD-10-CM | POA: Diagnosis not present

## 2013-08-10 DIAGNOSIS — I4891 Unspecified atrial fibrillation: Secondary | ICD-10-CM | POA: Diagnosis not present

## 2013-08-10 DIAGNOSIS — Z7901 Long term (current) use of anticoagulants: Secondary | ICD-10-CM | POA: Diagnosis not present

## 2013-08-19 DIAGNOSIS — I4891 Unspecified atrial fibrillation: Secondary | ICD-10-CM | POA: Diagnosis not present

## 2013-08-19 DIAGNOSIS — L0291 Cutaneous abscess, unspecified: Secondary | ICD-10-CM | POA: Diagnosis not present

## 2013-08-19 DIAGNOSIS — I82409 Acute embolism and thrombosis of unspecified deep veins of unspecified lower extremity: Secondary | ICD-10-CM | POA: Diagnosis not present

## 2013-08-19 DIAGNOSIS — Z7901 Long term (current) use of anticoagulants: Secondary | ICD-10-CM | POA: Diagnosis not present

## 2013-08-19 DIAGNOSIS — Z6825 Body mass index (BMI) 25.0-25.9, adult: Secondary | ICD-10-CM | POA: Diagnosis not present

## 2013-08-30 DIAGNOSIS — Z7901 Long term (current) use of anticoagulants: Secondary | ICD-10-CM | POA: Diagnosis not present

## 2013-08-30 DIAGNOSIS — I82409 Acute embolism and thrombosis of unspecified deep veins of unspecified lower extremity: Secondary | ICD-10-CM | POA: Diagnosis not present

## 2013-08-30 DIAGNOSIS — I4891 Unspecified atrial fibrillation: Secondary | ICD-10-CM | POA: Diagnosis not present

## 2013-08-30 DIAGNOSIS — L0291 Cutaneous abscess, unspecified: Secondary | ICD-10-CM | POA: Diagnosis not present

## 2013-09-07 ENCOUNTER — Encounter: Payer: Self-pay | Admitting: Internal Medicine

## 2013-09-09 DIAGNOSIS — R972 Elevated prostate specific antigen [PSA]: Secondary | ICD-10-CM | POA: Diagnosis not present

## 2013-09-09 DIAGNOSIS — K589 Irritable bowel syndrome without diarrhea: Secondary | ICD-10-CM | POA: Diagnosis not present

## 2013-09-09 DIAGNOSIS — Z6825 Body mass index (BMI) 25.0-25.9, adult: Secondary | ICD-10-CM | POA: Diagnosis not present

## 2013-09-09 DIAGNOSIS — M479 Spondylosis, unspecified: Secondary | ICD-10-CM | POA: Diagnosis not present

## 2013-09-09 DIAGNOSIS — I4891 Unspecified atrial fibrillation: Secondary | ICD-10-CM | POA: Diagnosis not present

## 2013-09-09 DIAGNOSIS — M1990 Unspecified osteoarthritis, unspecified site: Secondary | ICD-10-CM | POA: Diagnosis not present

## 2013-09-09 DIAGNOSIS — K219 Gastro-esophageal reflux disease without esophagitis: Secondary | ICD-10-CM | POA: Diagnosis not present

## 2013-09-26 ENCOUNTER — Ambulatory Visit (INDEPENDENT_AMBULATORY_CARE_PROVIDER_SITE_OTHER): Payer: Medicare Other | Admitting: Podiatry

## 2013-09-26 ENCOUNTER — Encounter: Payer: Self-pay | Admitting: Podiatry

## 2013-09-26 VITALS — BP 161/93 | HR 71 | Resp 18

## 2013-09-26 DIAGNOSIS — M79609 Pain in unspecified limb: Secondary | ICD-10-CM | POA: Diagnosis not present

## 2013-09-26 DIAGNOSIS — B351 Tinea unguium: Secondary | ICD-10-CM

## 2013-09-26 NOTE — Progress Notes (Signed)
   Subjective:    Patient ID: [name], male    [name]: 07/25/1929, 80 y.o.   MRN: [id number]  HPI just a trim of the big toenail on the left and the big toenail and the 2nd toe on the right   He is complaining of painful toenails as described above x3.  Review of Systems     Objective:   Physical Exam  Orientated x673 80 year old white male  Dermatological: Hypertrophic, discolored, brittle, hallux nails and second right toenail. The remaining toenails only of occasional dystrophic changes.      Assessment & Plan:   Assessment: Symptomatic onychomycoses 1 through 5 only  Plan: Hallux nails and second right toenail debrided without any bleeding. Reappoint at patient's request.

## 2013-09-29 DIAGNOSIS — I4891 Unspecified atrial fibrillation: Secondary | ICD-10-CM | POA: Diagnosis not present

## 2013-09-29 DIAGNOSIS — I82409 Acute embolism and thrombosis of unspecified deep veins of unspecified lower extremity: Secondary | ICD-10-CM | POA: Diagnosis not present

## 2013-09-29 DIAGNOSIS — Z7901 Long term (current) use of anticoagulants: Secondary | ICD-10-CM | POA: Diagnosis not present

## 2013-10-05 ENCOUNTER — Ambulatory Visit: Payer: Medicare Other | Admitting: Internal Medicine

## 2013-10-14 DIAGNOSIS — L0291 Cutaneous abscess, unspecified: Secondary | ICD-10-CM | POA: Diagnosis not present

## 2013-10-14 DIAGNOSIS — Z6825 Body mass index (BMI) 25.0-25.9, adult: Secondary | ICD-10-CM | POA: Diagnosis not present

## 2013-10-14 DIAGNOSIS — L0390 Cellulitis, unspecified: Secondary | ICD-10-CM | POA: Diagnosis not present

## 2013-10-21 DIAGNOSIS — Z7901 Long term (current) use of anticoagulants: Secondary | ICD-10-CM | POA: Diagnosis not present

## 2013-10-21 DIAGNOSIS — L0291 Cutaneous abscess, unspecified: Secondary | ICD-10-CM | POA: Diagnosis not present

## 2013-10-21 DIAGNOSIS — I82409 Acute embolism and thrombosis of unspecified deep veins of unspecified lower extremity: Secondary | ICD-10-CM | POA: Diagnosis not present

## 2013-10-21 DIAGNOSIS — L0390 Cellulitis, unspecified: Secondary | ICD-10-CM | POA: Diagnosis not present

## 2013-10-28 ENCOUNTER — Encounter: Payer: Self-pay | Admitting: Internal Medicine

## 2013-10-28 ENCOUNTER — Ambulatory Visit (INDEPENDENT_AMBULATORY_CARE_PROVIDER_SITE_OTHER): Payer: Medicare Other | Admitting: Internal Medicine

## 2013-10-28 VITALS — BP 126/70 | HR 66 | Ht 68.0 in | Wt 156.2 lb

## 2013-10-28 DIAGNOSIS — I495 Sick sinus syndrome: Secondary | ICD-10-CM

## 2013-10-28 DIAGNOSIS — I4891 Unspecified atrial fibrillation: Secondary | ICD-10-CM | POA: Diagnosis not present

## 2013-10-28 LAB — MDC_IDC_ENUM_SESS_TYPE_INCLINIC
Lead Channel Impedance Value: 400 Ohm
Lead Channel Impedance Value: 400 Ohm
Lead Channel Pacing Threshold Amplitude: 1 V
Lead Channel Pacing Threshold Pulse Width: 0.4 ms
Lead Channel Sensing Intrinsic Amplitude: 1.3798
Lead Channel Sensing Intrinsic Amplitude: 1.5154
Lead Channel Setting Pacing Amplitude: 2 V
Lead Channel Setting Sensing Sensitivity: 0.45 mV
MDC IDC MSMT BATTERY VOLTAGE: 3 V
MDC IDC SESS DTM: 20150220131053
MDC IDC SET ZONE DETECTION INTERVAL: 350 ms
MDC IDC SET ZONE DETECTION INTERVAL: 400 ms
MDC IDC STAT BRADY RA PERCENT PACED: 93.09 %

## 2013-10-28 MED ORDER — WARFARIN SODIUM 2 MG PO TABS: ORAL_TABLET | ORAL | Status: DC

## 2013-10-28 NOTE — Patient Instructions (Signed)
Your physician wants you to follow-up in: 12 months with Dr Jacquiline DoeAllred You will receive a reminder letter in the mail two months in advance. If you don't receive a letter, please call our office to schedule the follow-up appointment.    Remote monitoring is used to monitor your Pacemaker or ICD from home. This monitoring reduces the number of office visits required to check your device to one time per year. It allows us to keep an eye on the functioning of your device to ensure it is working properly. You are scheduled for a device check from home on  01/31/14 You may send your transmission at any time that day. If you have a wireless device, the transmission will be sent automatically. After your physician reviews your transmission, you will receive a postcard with your next transmission date.

## 2013-10-28 NOTE — Progress Notes (Signed)
PCP: Gaspar GarbeISOVEC,RICHARD W, MD  The patient presents today for routine electrophysiology followup.  Since last being seen in our clinic, the patient reports doing very well.  He remains very active within his church and also enjoys watching [employer] play basketball.  He is primarily limited by arthritis and spinal stenosis.   he denies symptoms of palpitations, chest pain, shortness of breath, orthopnea, PND, lower extremity edema, dizziness, presyncope, syncope, or neurologic sequela.  The patient feels that he is tolerating medications without difficulties and is otherwise without complaint today.   Past Medical History  Diagnosis Date  . PAF (paroxysmal atrial fibrillation)   . Atrial tachycardia   . Tachycardia-bradycardia syndrome     s/p PPM by ST 04/08/10, RV lead has dislodged but  appears stable AAIR  . Spinal stenosis   . DJD (degenerative joint disease)   . DDD (degenerative disc disease)   . Scoliosis   . Phlebitis   . GERD (gastroesophageal reflux disease)   . Nocturia    Past Surgical History  Procedure Laterality Date  . Inguinal hernia repair  1992  . Pacemaker insertion  04/08/10    [employer] - Revo - implanted by Dr. Deborah Chalkennant 04/08/10  RV lead has dislodged, though pt is stable AAIR    Current Outpatient Prescriptions  Medication Sig Dispense Refill  . Ascorbic Acid (VITAMIN C PO) Take 1,000 mg by mouth 2 (two) times daily.       . celecoxib (CELEBREX) 200 MG capsule Take 200 mg by mouth 2 (two) times daily.      . Coenzyme Q10 (CO Q 10 PO) Take 100 mg by mouth 2 (two) times daily.       . fish oil-omega-3 fatty acids 1000 MG capsule Take 2 g by mouth daily.        . Multiple Vitamin (MULTIVITAMIN) tablet Take 1 tablet by mouth 2 (two) times daily.        . sotalol (BETAPACE) 160 MG tablet TAKE 1 TABLET (160 MG TOTAL) BY MOUTH TWICE DAILY      . VITAMIN D, CHOLECALCIFEROL, PO Take 5,000 Units by mouth daily.       Marland Kitchen. VITAMIN E PO Take 400 Units by mouth daily.        No current  facility-administered medications for this visit.    No Known Allergies  History   Social History  . Marital Status: Married    Spouse Name: N/A    Number of Children: N/A  . Years of Education: N/A   Occupational History  . Retired Optician, dispensingMinister    Social History Main Topics  . Smoking status: Never Smoker   . Smokeless tobacco: Never Used  . Alcohol Use: No  . Drug Use: No  . Sexual Activity: Not on file   Other Topics Concern  . Not on file   Social History Narrative   Retired Optician, dispensingMinister.          Family History  Problem Relation Age of Onset  . Cancer     Physical Exam: Filed Vitals:   10/28/13 1118  BP: 126/70  Pulse: 66  Height: 5\' 8"  (1.727 m)  Weight: 156 lb 3.2 oz (70.852 kg)    GEN- The patient is well appearing, alert and oriented x 3 today.   Head- normocephalic, atraumatic Eyes-  Sclera clear, conjunctiva pink Ears- hearing intact Oropharynx- clear Neck- supple, Lungs- Clear to ausculation bilaterally, normal work of breathing Chest- pacemaker pocket is well healed Heart- Regular rate and  rhythm, no murmurs, rubs or gallops, PMI not laterally displaced GI- soft, NT, ND, + BS Extremities- no clubbing, cyanosis, or edema  Pacemaker interrogation- reviewed in detail today,  See PACEART report  Assessment and Plan:  1. Sick sinus syndrome Doing very well  S/p MDT Revo PPM by Dr Deborah Chalk with RV lead perforation/ dislodgement.  Programmed AAIR and having no issues. He is clear that he does not want lead revision.  No changes today.  2. Paroxysmal atrial fibrillation/ atrial flutter He has been appropriately anticoagulated with coumadin chads2vasc score is at least 2  carelink Return to see Nehemiah Settle in 1 year

## 2013-11-11 DIAGNOSIS — I4891 Unspecified atrial fibrillation: Secondary | ICD-10-CM | POA: Diagnosis not present

## 2013-11-11 DIAGNOSIS — I82409 Acute embolism and thrombosis of unspecified deep veins of unspecified lower extremity: Secondary | ICD-10-CM | POA: Diagnosis not present

## 2013-11-11 DIAGNOSIS — Z7901 Long term (current) use of anticoagulants: Secondary | ICD-10-CM | POA: Diagnosis not present

## 2013-11-18 DIAGNOSIS — M5137 Other intervertebral disc degeneration, lumbosacral region: Secondary | ICD-10-CM | POA: Diagnosis not present

## 2013-11-18 DIAGNOSIS — M48061 Spinal stenosis, lumbar region without neurogenic claudication: Secondary | ICD-10-CM | POA: Diagnosis not present

## 2013-11-18 DIAGNOSIS — M545 Low back pain, unspecified: Secondary | ICD-10-CM | POA: Diagnosis not present

## 2013-11-18 DIAGNOSIS — M4120 Other idiopathic scoliosis, site unspecified: Secondary | ICD-10-CM | POA: Diagnosis not present

## 2013-12-08 DIAGNOSIS — I82409 Acute embolism and thrombosis of unspecified deep veins of unspecified lower extremity: Secondary | ICD-10-CM | POA: Diagnosis not present

## 2013-12-08 DIAGNOSIS — Z79899 Other long term (current) drug therapy: Secondary | ICD-10-CM | POA: Diagnosis not present

## 2013-12-08 DIAGNOSIS — I4891 Unspecified atrial fibrillation: Secondary | ICD-10-CM | POA: Diagnosis not present

## 2013-12-23 DIAGNOSIS — L0291 Cutaneous abscess, unspecified: Secondary | ICD-10-CM | POA: Diagnosis not present

## 2013-12-23 DIAGNOSIS — I4891 Unspecified atrial fibrillation: Secondary | ICD-10-CM | POA: Diagnosis not present

## 2013-12-23 DIAGNOSIS — L0390 Cellulitis, unspecified: Secondary | ICD-10-CM | POA: Diagnosis not present

## 2013-12-23 DIAGNOSIS — L97909 Non-pressure chronic ulcer of unspecified part of unspecified lower leg with unspecified severity: Secondary | ICD-10-CM | POA: Diagnosis not present

## 2013-12-23 DIAGNOSIS — I82409 Acute embolism and thrombosis of unspecified deep veins of unspecified lower extremity: Secondary | ICD-10-CM | POA: Diagnosis not present

## 2013-12-23 DIAGNOSIS — Z7901 Long term (current) use of anticoagulants: Secondary | ICD-10-CM | POA: Diagnosis not present

## 2013-12-29 DIAGNOSIS — M545 Low back pain, unspecified: Secondary | ICD-10-CM | POA: Diagnosis not present

## 2014-01-06 ENCOUNTER — Encounter (HOSPITAL_BASED_OUTPATIENT_CLINIC_OR_DEPARTMENT_OTHER): Payer: Medicare Other | Attending: General Surgery

## 2014-01-06 DIAGNOSIS — I4891 Unspecified atrial fibrillation: Secondary | ICD-10-CM | POA: Diagnosis not present

## 2014-01-06 DIAGNOSIS — K219 Gastro-esophageal reflux disease without esophagitis: Secondary | ICD-10-CM | POA: Diagnosis not present

## 2014-01-06 DIAGNOSIS — M479 Spondylosis, unspecified: Secondary | ICD-10-CM | POA: Diagnosis not present

## 2014-01-06 DIAGNOSIS — M1990 Unspecified osteoarthritis, unspecified site: Secondary | ICD-10-CM | POA: Diagnosis not present

## 2014-01-06 DIAGNOSIS — IMO0002 Reserved for concepts with insufficient information to code with codable children: Secondary | ICD-10-CM | POA: Diagnosis not present

## 2014-01-06 DIAGNOSIS — I82409 Acute embolism and thrombosis of unspecified deep veins of unspecified lower extremity: Secondary | ICD-10-CM | POA: Diagnosis not present

## 2014-01-06 DIAGNOSIS — L97909 Non-pressure chronic ulcer of unspecified part of unspecified lower leg with unspecified severity: Secondary | ICD-10-CM | POA: Diagnosis not present

## 2014-01-06 DIAGNOSIS — Z79899 Other long term (current) drug therapy: Secondary | ICD-10-CM | POA: Diagnosis not present

## 2014-01-26 DIAGNOSIS — Z7901 Long term (current) use of anticoagulants: Secondary | ICD-10-CM | POA: Diagnosis not present

## 2014-01-26 DIAGNOSIS — I4891 Unspecified atrial fibrillation: Secondary | ICD-10-CM | POA: Diagnosis not present

## 2014-01-26 DIAGNOSIS — I82409 Acute embolism and thrombosis of unspecified deep veins of unspecified lower extremity: Secondary | ICD-10-CM | POA: Diagnosis not present

## 2014-01-31 ENCOUNTER — Ambulatory Visit (INDEPENDENT_AMBULATORY_CARE_PROVIDER_SITE_OTHER): Payer: Medicare Other | Admitting: *Deleted

## 2014-01-31 ENCOUNTER — Encounter: Payer: Self-pay | Admitting: Internal Medicine

## 2014-01-31 ENCOUNTER — Telehealth: Payer: Self-pay | Admitting: Cardiology

## 2014-01-31 DIAGNOSIS — I4891 Unspecified atrial fibrillation: Secondary | ICD-10-CM

## 2014-01-31 DIAGNOSIS — I495 Sick sinus syndrome: Secondary | ICD-10-CM | POA: Diagnosis not present

## 2014-01-31 NOTE — Telephone Encounter (Signed)
Attempted to call pt and walk pt through transmission. Pt did not answer phone.

## 2014-01-31 NOTE — Telephone Encounter (Signed)
I called pt and reminded pt of remote transmission. Pt informed me that he has not hooked monitor up yet. Pt will call back later to have someone walk him through how to set up monitor.

## 2014-01-31 NOTE — Progress Notes (Signed)
Remote pacemaker transmission.   

## 2014-02-07 LAB — MDC_IDC_ENUM_SESS_TYPE_REMOTE
Brady Statistic RA Percent Paced: 91.3 %
MDC IDC MSMT LEADCHNL RA SENSING INTR AMPL: 1.9 mV

## 2014-02-09 ENCOUNTER — Other Ambulatory Visit: Payer: Self-pay | Admitting: Internal Medicine

## 2014-02-17 ENCOUNTER — Encounter: Payer: Self-pay | Admitting: Cardiology

## 2014-02-24 DIAGNOSIS — I4891 Unspecified atrial fibrillation: Secondary | ICD-10-CM | POA: Diagnosis not present

## 2014-02-24 DIAGNOSIS — G894 Chronic pain syndrome: Secondary | ICD-10-CM | POA: Diagnosis not present

## 2014-02-24 DIAGNOSIS — M5030 Other cervical disc degeneration, unspecified cervical region: Secondary | ICD-10-CM | POA: Diagnosis not present

## 2014-02-24 DIAGNOSIS — Z7901 Long term (current) use of anticoagulants: Secondary | ICD-10-CM | POA: Diagnosis not present

## 2014-02-24 DIAGNOSIS — I82409 Acute embolism and thrombosis of unspecified deep veins of unspecified lower extremity: Secondary | ICD-10-CM | POA: Diagnosis not present

## 2014-02-24 DIAGNOSIS — R634 Abnormal weight loss: Secondary | ICD-10-CM | POA: Diagnosis not present

## 2014-02-24 DIAGNOSIS — M25579 Pain in unspecified ankle and joints of unspecified foot: Secondary | ICD-10-CM | POA: Diagnosis not present

## 2014-02-24 DIAGNOSIS — M5137 Other intervertebral disc degeneration, lumbosacral region: Secondary | ICD-10-CM | POA: Diagnosis not present

## 2014-02-27 ENCOUNTER — Encounter (HOSPITAL_BASED_OUTPATIENT_CLINIC_OR_DEPARTMENT_OTHER): Payer: Medicare Other | Attending: Plastic Surgery

## 2014-02-27 DIAGNOSIS — L97309 Non-pressure chronic ulcer of unspecified ankle with unspecified severity: Secondary | ICD-10-CM | POA: Insufficient documentation

## 2014-02-27 DIAGNOSIS — Z7901 Long term (current) use of anticoagulants: Secondary | ICD-10-CM | POA: Insufficient documentation

## 2014-02-27 DIAGNOSIS — Z950 Presence of cardiac pacemaker: Secondary | ICD-10-CM | POA: Insufficient documentation

## 2014-02-27 DIAGNOSIS — M1990 Unspecified osteoarthritis, unspecified site: Secondary | ICD-10-CM | POA: Insufficient documentation

## 2014-02-27 DIAGNOSIS — I498 Other specified cardiac arrhythmias: Secondary | ICD-10-CM | POA: Diagnosis not present

## 2014-02-27 DIAGNOSIS — Z79899 Other long term (current) drug therapy: Secondary | ICD-10-CM | POA: Insufficient documentation

## 2014-02-27 DIAGNOSIS — K219 Gastro-esophageal reflux disease without esophagitis: Secondary | ICD-10-CM | POA: Diagnosis not present

## 2014-03-01 NOTE — Consult Note (Signed)
[name]:  Donald, Potts             ACCOUNT NO.:  [id number]  MEDICAL RECORD NO.:  [id number]  LOCATION:  FOOT                         FACILITY:  [facility]  PHYSICIAN:  Glenna FellowsBrinda Thimmappa, MD   DATE OF BIRTH:  Sep 05, 1929  DATE OF CONSULTATION:  02/27/2014 DATE OF DISCHARGE:                                CONSULTATION   REFERRING PHYSICIAN:  Gaspar Garbeichard W. Tisovec, M.D.  CHIEF COMPLAINT:  Left ankle ulcer.  HISTORY OF PRESENT ILLNESS:  The patient is an 80 year old ambulatory male that presents with several week history of ulceration over the left medial ankle.  PAST MEDICAL HISTORY:  Includes atrial tachycardia, degenerative joint disease with spinal stenosis and reflux.  PAST SURGICAL HISTORY:  Includes pacemaker insertion and inguinal hernia repair.  MEDICATIONS:  Include Coumadin 5, vitamin E, vitamin D, sotalol, and Celebrex.  ALLERGIES:  NO KNOWN DRUG ALLERGIES.  SOCIAL HISTORY:  The patient denies any smoking history.  PHYSICAL EXAMINATION:  VITAL SIGNS:  Temperature 97.2, pulse 63, blood pressure is 147/75, respirations 18, height is 5 feet 8 inches, weight is 145 pounds.  ABI is calculated at 0.95. EXTREMITIES:  Over the left lower extremity, Examination of reveals no hair over the distal leg and he is sensate over all areas tested on Semmes-Weinstein test. Over the medial ankle, he has open wound measured at 0.8 x 0.6 x 0.1 cm. There is no surrounding cellulitis.  He has palpable dorsalis pedis and posterior tibialis pulses.  There is no edema present.  ASSESSMENT:  Left medial ankle ulcer after application of topical anesthetic, curette was used to remove all the superficial slough over the entirety of wound and wounds to collagen dressings to be completed 3 times weekly and the patient will follow up in 2 week's time and did order a prealbumin at this visit.  We will defer further vascular workup at this time given his normal ABI with palpable pulses.     ______________________________ Glenna FellowsBrinda Thimmappa, MD    BT/MEDQ  D:  02/28/2014  T:  03/01/2014  Job:  [id number]

## 2014-03-02 DIAGNOSIS — L97909 Non-pressure chronic ulcer of unspecified part of unspecified lower leg with unspecified severity: Secondary | ICD-10-CM | POA: Diagnosis not present

## 2014-03-13 ENCOUNTER — Encounter (HOSPITAL_BASED_OUTPATIENT_CLINIC_OR_DEPARTMENT_OTHER): Payer: Medicare Other | Attending: Plastic Surgery

## 2014-03-13 DIAGNOSIS — L97309 Non-pressure chronic ulcer of unspecified ankle with unspecified severity: Secondary | ICD-10-CM | POA: Insufficient documentation

## 2014-03-14 NOTE — Progress Notes (Signed)
[facility]  NAME:  [name], [name]             ACCOUNT NO.:  [id number]  MEDICAL RECORD NO.:  [id number]      DATE OF BIRTH:  Sep 02, 1929  PHYSICIAN:  Glenna FellowsBrinda Kaikoa Magro, MD    VISIT DATE:  03/13/2014                                  OFFICE VISIT   CHIEF COMPLAINT:  Left ankle ulcer.  HISTORY OF PRESENT ILLNESS:  The patient is an 80 year old ambulatory male with a several week history of ulceration of left medial ankle. Current wound care is collagen with foam dressing.  The patient reports that he left two messages as he nearly ran out of his wound care supplies and had to cut the pieces smaller and smaller to last until yesterday.  He did not receive any call back from his messages from this [facility].  We also ordered a prealbumin at his last visit and the request was not given to the patient.  We reviewed the patient's protein intake and discussed methods for increasing it.  PHYSICAL EXAMINATION:  VITAL SIGNS:  Blood pressure is 138/72, pulse is 73, temperature is 97.8. EXTREMITIES:  Over left medial ankle, he has remaining open wound, measured as 0.5 x 0.4 x 0.1 cm.  After application of topical anesthetic, curette was used to perform selective debridement to remove the small amount of slough that was present.  ASSESSMENT:  Contraction of chronic left medial ankle ulcer.  The patient was encouraged to keep his leg elevated to reduce edema of the leg. We will continue with collagen and foam dressing and followup in 2 week's time.  Prealbumin once again ordered.          ______________________________ Glenna FellowsBrinda Jatin Naumann, MD     BT/MEDQ  D:  03/13/2014  T:  03/14/2014  Job:  [id number]

## 2014-03-24 DIAGNOSIS — I4891 Unspecified atrial fibrillation: Secondary | ICD-10-CM | POA: Diagnosis not present

## 2014-03-24 DIAGNOSIS — Z7901 Long term (current) use of anticoagulants: Secondary | ICD-10-CM | POA: Diagnosis not present

## 2014-03-24 DIAGNOSIS — I82409 Acute embolism and thrombosis of unspecified deep veins of unspecified lower extremity: Secondary | ICD-10-CM | POA: Diagnosis not present

## 2014-03-27 DIAGNOSIS — M5137 Other intervertebral disc degeneration, lumbosacral region: Secondary | ICD-10-CM | POA: Diagnosis not present

## 2014-03-27 DIAGNOSIS — M545 Low back pain, unspecified: Secondary | ICD-10-CM | POA: Diagnosis not present

## 2014-04-04 ENCOUNTER — Ambulatory Visit (INDEPENDENT_AMBULATORY_CARE_PROVIDER_SITE_OTHER): Payer: Medicare Other | Admitting: Pharmacist Clinician (PhC)/ Clinical Pharmacy Specialist

## 2014-04-04 DIAGNOSIS — M545 Low back pain, unspecified: Secondary | ICD-10-CM | POA: Diagnosis not present

## 2014-04-04 DIAGNOSIS — I4891 Unspecified atrial fibrillation: Secondary | ICD-10-CM

## 2014-04-04 LAB — POCT INR: INR: 1.1

## 2014-04-17 ENCOUNTER — Encounter (HOSPITAL_BASED_OUTPATIENT_CLINIC_OR_DEPARTMENT_OTHER): Payer: Medicare Other | Attending: Plastic Surgery

## 2014-04-17 DIAGNOSIS — L97309 Non-pressure chronic ulcer of unspecified ankle with unspecified severity: Secondary | ICD-10-CM | POA: Diagnosis not present

## 2014-04-17 DIAGNOSIS — Z86718 Personal history of other venous thrombosis and embolism: Secondary | ICD-10-CM | POA: Diagnosis not present

## 2014-04-17 DIAGNOSIS — Z7901 Long term (current) use of anticoagulants: Secondary | ICD-10-CM | POA: Insufficient documentation

## 2014-04-17 DIAGNOSIS — I872 Venous insufficiency (chronic) (peripheral): Secondary | ICD-10-CM | POA: Diagnosis not present

## 2014-04-21 DIAGNOSIS — M5030 Other cervical disc degeneration, unspecified cervical region: Secondary | ICD-10-CM | POA: Diagnosis not present

## 2014-04-21 DIAGNOSIS — I4891 Unspecified atrial fibrillation: Secondary | ICD-10-CM | POA: Diagnosis not present

## 2014-04-21 DIAGNOSIS — M545 Low back pain, unspecified: Secondary | ICD-10-CM | POA: Diagnosis not present

## 2014-04-21 DIAGNOSIS — Z7901 Long term (current) use of anticoagulants: Secondary | ICD-10-CM | POA: Diagnosis not present

## 2014-04-21 DIAGNOSIS — G894 Chronic pain syndrome: Secondary | ICD-10-CM | POA: Diagnosis not present

## 2014-04-21 DIAGNOSIS — M5137 Other intervertebral disc degeneration, lumbosacral region: Secondary | ICD-10-CM | POA: Diagnosis not present

## 2014-04-25 DIAGNOSIS — R972 Elevated prostate specific antigen [PSA]: Secondary | ICD-10-CM | POA: Diagnosis not present

## 2014-04-26 DIAGNOSIS — E039 Hypothyroidism, unspecified: Secondary | ICD-10-CM | POA: Diagnosis not present

## 2014-04-26 DIAGNOSIS — R6889 Other general symptoms and signs: Secondary | ICD-10-CM | POA: Diagnosis not present

## 2014-04-26 DIAGNOSIS — R5381 Other malaise: Secondary | ICD-10-CM | POA: Diagnosis not present

## 2014-04-26 DIAGNOSIS — E559 Vitamin D deficiency, unspecified: Secondary | ICD-10-CM | POA: Diagnosis not present

## 2014-04-26 DIAGNOSIS — R971 Elevated cancer antigen 125 [CA 125]: Secondary | ICD-10-CM | POA: Diagnosis not present

## 2014-04-26 DIAGNOSIS — R5383 Other fatigue: Secondary | ICD-10-CM | POA: Diagnosis not present

## 2014-04-26 DIAGNOSIS — E291 Testicular hypofunction: Secondary | ICD-10-CM | POA: Diagnosis not present

## 2014-04-26 DIAGNOSIS — R7989 Other specified abnormal findings of blood chemistry: Secondary | ICD-10-CM | POA: Diagnosis not present

## 2014-05-08 DIAGNOSIS — I872 Venous insufficiency (chronic) (peripheral): Secondary | ICD-10-CM | POA: Diagnosis not present

## 2014-05-08 DIAGNOSIS — L97309 Non-pressure chronic ulcer of unspecified ankle with unspecified severity: Secondary | ICD-10-CM | POA: Diagnosis not present

## 2014-05-08 DIAGNOSIS — Z86718 Personal history of other venous thrombosis and embolism: Secondary | ICD-10-CM | POA: Diagnosis not present

## 2014-05-08 DIAGNOSIS — Z7901 Long term (current) use of anticoagulants: Secondary | ICD-10-CM | POA: Diagnosis not present

## 2014-05-11 ENCOUNTER — Telehealth: Payer: Self-pay | Admitting: Cardiology

## 2014-05-11 ENCOUNTER — Ambulatory Visit (INDEPENDENT_AMBULATORY_CARE_PROVIDER_SITE_OTHER): Payer: Medicare Other | Admitting: *Deleted

## 2014-05-11 ENCOUNTER — Encounter: Payer: Self-pay | Admitting: Internal Medicine

## 2014-05-11 DIAGNOSIS — I495 Sick sinus syndrome: Secondary | ICD-10-CM

## 2014-05-11 NOTE — Telephone Encounter (Signed)
Spoke with pt and reminded pt of remote transmission that is due today. Pt verbalized understanding.   

## 2014-05-12 ENCOUNTER — Encounter: Payer: Self-pay | Admitting: Cardiology

## 2014-05-12 DIAGNOSIS — M4800 Spinal stenosis, site unspecified: Secondary | ICD-10-CM | POA: Diagnosis not present

## 2014-05-12 DIAGNOSIS — R972 Elevated prostate specific antigen [PSA]: Secondary | ICD-10-CM | POA: Diagnosis not present

## 2014-05-12 NOTE — Progress Notes (Signed)
Remote pacemaker transmission.   

## 2014-05-22 ENCOUNTER — Encounter (HOSPITAL_BASED_OUTPATIENT_CLINIC_OR_DEPARTMENT_OTHER): Payer: Medicare Other | Attending: Plastic Surgery

## 2014-05-22 DIAGNOSIS — L97309 Non-pressure chronic ulcer of unspecified ankle with unspecified severity: Secondary | ICD-10-CM | POA: Insufficient documentation

## 2014-05-22 DIAGNOSIS — I872 Venous insufficiency (chronic) (peripheral): Secondary | ICD-10-CM | POA: Insufficient documentation

## 2014-05-22 LAB — MDC_IDC_ENUM_SESS_TYPE_REMOTE
Brady Statistic RA Percent Paced: 94.65 %
Lead Channel Impedance Value: 352 Ohm
Lead Channel Sensing Intrinsic Amplitude: 0.6927
Lead Channel Setting Pacing Amplitude: 2 V
Lead Channel Setting Sensing Sensitivity: 0.45 mV
MDC IDC MSMT BATTERY VOLTAGE: 2.99 V
MDC IDC MSMT LEADCHNL RA IMPEDANCE VALUE: 384 Ohm
MDC IDC SESS DTM: 20150904140005
MDC IDC SET ZONE DETECTION INTERVAL: 350 ms
Zone Setting Detection Interval: 400 ms

## 2014-05-24 ENCOUNTER — Encounter: Payer: Self-pay | Admitting: Cardiology

## 2014-05-26 DIAGNOSIS — I4891 Unspecified atrial fibrillation: Secondary | ICD-10-CM | POA: Diagnosis not present

## 2014-05-26 DIAGNOSIS — Z79899 Other long term (current) drug therapy: Secondary | ICD-10-CM | POA: Diagnosis not present

## 2014-05-26 DIAGNOSIS — R972 Elevated prostate specific antigen [PSA]: Secondary | ICD-10-CM | POA: Diagnosis not present

## 2014-05-26 DIAGNOSIS — K219 Gastro-esophageal reflux disease without esophagitis: Secondary | ICD-10-CM | POA: Diagnosis not present

## 2014-05-26 DIAGNOSIS — Z7901 Long term (current) use of anticoagulants: Secondary | ICD-10-CM | POA: Diagnosis not present

## 2014-05-26 DIAGNOSIS — M1990 Unspecified osteoarthritis, unspecified site: Secondary | ICD-10-CM | POA: Diagnosis not present

## 2014-05-26 DIAGNOSIS — IMO0002 Reserved for concepts with insufficient information to code with codable children: Secondary | ICD-10-CM | POA: Diagnosis not present

## 2014-05-26 DIAGNOSIS — K589 Irritable bowel syndrome without diarrhea: Secondary | ICD-10-CM | POA: Diagnosis not present

## 2014-05-26 DIAGNOSIS — Z1331 Encounter for screening for depression: Secondary | ICD-10-CM | POA: Diagnosis not present

## 2014-06-23 DIAGNOSIS — I482 Chronic atrial fibrillation: Secondary | ICD-10-CM | POA: Diagnosis not present

## 2014-06-23 DIAGNOSIS — Z7901 Long term (current) use of anticoagulants: Secondary | ICD-10-CM | POA: Diagnosis not present

## 2014-06-23 DIAGNOSIS — I8290 Acute embolism and thrombosis of unspecified vein: Secondary | ICD-10-CM | POA: Diagnosis not present

## 2014-07-07 DIAGNOSIS — M25571 Pain in right ankle and joints of right foot: Secondary | ICD-10-CM | POA: Diagnosis not present

## 2014-07-07 DIAGNOSIS — M5442 Lumbago with sciatica, left side: Secondary | ICD-10-CM | POA: Diagnosis not present

## 2014-07-07 DIAGNOSIS — M5030 Other cervical disc degeneration, unspecified cervical region: Secondary | ICD-10-CM | POA: Diagnosis not present

## 2014-07-07 DIAGNOSIS — M5441 Lumbago with sciatica, right side: Secondary | ICD-10-CM | POA: Diagnosis not present

## 2014-07-21 DIAGNOSIS — M25561 Pain in right knee: Secondary | ICD-10-CM | POA: Diagnosis not present

## 2014-07-21 DIAGNOSIS — M545 Low back pain: Secondary | ICD-10-CM | POA: Diagnosis not present

## 2014-07-26 DIAGNOSIS — M25561 Pain in right knee: Secondary | ICD-10-CM | POA: Diagnosis not present

## 2014-07-26 DIAGNOSIS — M545 Low back pain: Secondary | ICD-10-CM | POA: Diagnosis not present

## 2014-07-28 DIAGNOSIS — M545 Low back pain: Secondary | ICD-10-CM | POA: Diagnosis not present

## 2014-07-28 DIAGNOSIS — M25561 Pain in right knee: Secondary | ICD-10-CM | POA: Diagnosis not present

## 2014-07-31 DIAGNOSIS — Z9849 Cataract extraction status, unspecified eye: Secondary | ICD-10-CM | POA: Diagnosis not present

## 2014-07-31 DIAGNOSIS — I8290 Acute embolism and thrombosis of unspecified vein: Secondary | ICD-10-CM | POA: Diagnosis not present

## 2014-07-31 DIAGNOSIS — Z7901 Long term (current) use of anticoagulants: Secondary | ICD-10-CM | POA: Diagnosis not present

## 2014-07-31 DIAGNOSIS — H01009 Unspecified blepharitis unspecified eye, unspecified eyelid: Secondary | ICD-10-CM | POA: Diagnosis not present

## 2014-07-31 DIAGNOSIS — H35371 Puckering of macula, right eye: Secondary | ICD-10-CM | POA: Diagnosis not present

## 2014-07-31 DIAGNOSIS — H52223 Regular astigmatism, bilateral: Secondary | ICD-10-CM | POA: Diagnosis not present

## 2014-08-01 DIAGNOSIS — M25561 Pain in right knee: Secondary | ICD-10-CM | POA: Diagnosis not present

## 2014-08-01 DIAGNOSIS — M545 Low back pain: Secondary | ICD-10-CM | POA: Diagnosis not present

## 2014-08-04 DIAGNOSIS — M25561 Pain in right knee: Secondary | ICD-10-CM | POA: Diagnosis not present

## 2014-08-04 DIAGNOSIS — M545 Low back pain: Secondary | ICD-10-CM | POA: Diagnosis not present

## 2014-08-08 DIAGNOSIS — M25561 Pain in right knee: Secondary | ICD-10-CM | POA: Diagnosis not present

## 2014-08-08 DIAGNOSIS — M545 Low back pain: Secondary | ICD-10-CM | POA: Diagnosis not present

## 2014-08-11 DIAGNOSIS — M25561 Pain in right knee: Secondary | ICD-10-CM | POA: Diagnosis not present

## 2014-08-11 DIAGNOSIS — M545 Low back pain: Secondary | ICD-10-CM | POA: Diagnosis not present

## 2014-08-14 ENCOUNTER — Telehealth: Payer: Self-pay | Admitting: Cardiology

## 2014-08-14 ENCOUNTER — Encounter: Payer: Medicare Other | Admitting: *Deleted

## 2014-08-14 NOTE — Telephone Encounter (Signed)
Spoke with pt and reminded pt of remote transmission that is due today. Pt verbalized understanding.   

## 2014-08-15 ENCOUNTER — Encounter: Payer: Self-pay | Admitting: Cardiology

## 2014-08-15 DIAGNOSIS — M25561 Pain in right knee: Secondary | ICD-10-CM | POA: Diagnosis not present

## 2014-08-15 DIAGNOSIS — M545 Low back pain: Secondary | ICD-10-CM | POA: Diagnosis not present

## 2014-08-18 ENCOUNTER — Ambulatory Visit (INDEPENDENT_AMBULATORY_CARE_PROVIDER_SITE_OTHER): Payer: Medicare Other | Admitting: *Deleted

## 2014-08-18 ENCOUNTER — Encounter: Payer: Self-pay | Admitting: Internal Medicine

## 2014-08-18 DIAGNOSIS — I495 Sick sinus syndrome: Secondary | ICD-10-CM | POA: Diagnosis not present

## 2014-08-18 DIAGNOSIS — M545 Low back pain: Secondary | ICD-10-CM | POA: Diagnosis not present

## 2014-08-18 DIAGNOSIS — M25561 Pain in right knee: Secondary | ICD-10-CM | POA: Diagnosis not present

## 2014-08-18 LAB — MDC_IDC_ENUM_SESS_TYPE_REMOTE
Battery Voltage: 2.99 V
Brady Statistic RA Percent Paced: 95.13 %
Date Time Interrogation Session: 20151211153734
Lead Channel Impedance Value: 324 Ohm
Lead Channel Impedance Value: 360 Ohm
Lead Channel Sensing Intrinsic Amplitude: 1.2123
Lead Channel Sensing Intrinsic Amplitude: 1.2123
Lead Channel Setting Pacing Amplitude: 2 V
Lead Channel Setting Sensing Sensitivity: 0.45 mV
MDC IDC MSMT BATTERY VOLTAGE: 2.99 V
MDC IDC MSMT LEADCHNL RA IMPEDANCE VALUE: 360 Ohm
MDC IDC MSMT LEADCHNL RV IMPEDANCE VALUE: 324 Ohm
MDC IDC SESS DTM: 20151211153734
MDC IDC SET LEADCHNL RA PACING AMPLITUDE: 2 V
MDC IDC SET LEADCHNL RV SENSING SENSITIVITY: 0.45 mV
MDC IDC SET ZONE DETECTION INTERVAL: 400 ms
MDC IDC STAT BRADY RA PERCENT PACED: 95.13 %
Zone Setting Detection Interval: 350 ms
Zone Setting Detection Interval: 350 ms
Zone Setting Detection Interval: 400 ms

## 2014-08-18 NOTE — Progress Notes (Signed)
Remote pacemaker transmission.   

## 2014-08-22 DIAGNOSIS — M545 Low back pain: Secondary | ICD-10-CM | POA: Diagnosis not present

## 2014-08-22 DIAGNOSIS — M25561 Pain in right knee: Secondary | ICD-10-CM | POA: Diagnosis not present

## 2014-08-25 DIAGNOSIS — M545 Low back pain: Secondary | ICD-10-CM | POA: Diagnosis not present

## 2014-08-25 DIAGNOSIS — M25561 Pain in right knee: Secondary | ICD-10-CM | POA: Diagnosis not present

## 2014-08-28 ENCOUNTER — Encounter: Payer: Self-pay | Admitting: Cardiology

## 2014-08-28 DIAGNOSIS — Z7901 Long term (current) use of anticoagulants: Secondary | ICD-10-CM | POA: Diagnosis not present

## 2014-08-28 DIAGNOSIS — I8290 Acute embolism and thrombosis of unspecified vein: Secondary | ICD-10-CM | POA: Diagnosis not present

## 2014-08-28 DIAGNOSIS — L0390 Cellulitis, unspecified: Secondary | ICD-10-CM | POA: Diagnosis not present

## 2014-09-05 DIAGNOSIS — M25561 Pain in right knee: Secondary | ICD-10-CM | POA: Diagnosis not present

## 2014-09-05 DIAGNOSIS — M545 Low back pain: Secondary | ICD-10-CM | POA: Diagnosis not present

## 2014-09-08 DIAGNOSIS — M545 Low back pain: Secondary | ICD-10-CM | POA: Diagnosis not present

## 2014-09-08 DIAGNOSIS — M25561 Pain in right knee: Secondary | ICD-10-CM | POA: Diagnosis not present

## 2014-09-12 DIAGNOSIS — M545 Low back pain: Secondary | ICD-10-CM | POA: Diagnosis not present

## 2014-09-12 DIAGNOSIS — M25561 Pain in right knee: Secondary | ICD-10-CM | POA: Diagnosis not present

## 2014-09-15 DIAGNOSIS — M25561 Pain in right knee: Secondary | ICD-10-CM | POA: Diagnosis not present

## 2014-09-15 DIAGNOSIS — M545 Low back pain: Secondary | ICD-10-CM | POA: Diagnosis not present

## 2014-09-19 DIAGNOSIS — M25561 Pain in right knee: Secondary | ICD-10-CM | POA: Diagnosis not present

## 2014-09-19 DIAGNOSIS — M545 Low back pain: Secondary | ICD-10-CM | POA: Diagnosis not present

## 2014-09-22 DIAGNOSIS — M25561 Pain in right knee: Secondary | ICD-10-CM | POA: Diagnosis not present

## 2014-09-22 DIAGNOSIS — M545 Low back pain: Secondary | ICD-10-CM | POA: Diagnosis not present

## 2014-09-26 DIAGNOSIS — I8290 Acute embolism and thrombosis of unspecified vein: Secondary | ICD-10-CM | POA: Diagnosis not present

## 2014-09-26 DIAGNOSIS — L0390 Cellulitis, unspecified: Secondary | ICD-10-CM | POA: Diagnosis not present

## 2014-09-26 DIAGNOSIS — Z7901 Long term (current) use of anticoagulants: Secondary | ICD-10-CM | POA: Diagnosis not present

## 2014-09-26 DIAGNOSIS — I482 Chronic atrial fibrillation: Secondary | ICD-10-CM | POA: Diagnosis not present

## 2014-10-06 DIAGNOSIS — M5030 Other cervical disc degeneration, unspecified cervical region: Secondary | ICD-10-CM | POA: Diagnosis not present

## 2014-10-06 DIAGNOSIS — M5441 Lumbago with sciatica, right side: Secondary | ICD-10-CM | POA: Diagnosis not present

## 2014-10-06 DIAGNOSIS — M5442 Lumbago with sciatica, left side: Secondary | ICD-10-CM | POA: Diagnosis not present

## 2014-10-06 DIAGNOSIS — G894 Chronic pain syndrome: Secondary | ICD-10-CM | POA: Diagnosis not present

## 2014-10-17 DIAGNOSIS — I8290 Acute embolism and thrombosis of unspecified vein: Secondary | ICD-10-CM | POA: Diagnosis not present

## 2014-10-17 DIAGNOSIS — Z7901 Long term (current) use of anticoagulants: Secondary | ICD-10-CM | POA: Diagnosis not present

## 2014-10-17 DIAGNOSIS — I482 Chronic atrial fibrillation: Secondary | ICD-10-CM | POA: Diagnosis not present

## 2014-10-17 DIAGNOSIS — L97909 Non-pressure chronic ulcer of unspecified part of unspecified lower leg with unspecified severity: Secondary | ICD-10-CM | POA: Diagnosis not present

## 2014-10-20 ENCOUNTER — Encounter (HOSPITAL_BASED_OUTPATIENT_CLINIC_OR_DEPARTMENT_OTHER): Payer: Medicare Other | Attending: Internal Medicine

## 2014-11-02 ENCOUNTER — Encounter: Payer: Self-pay | Admitting: *Deleted

## 2014-11-03 DIAGNOSIS — I482 Chronic atrial fibrillation: Secondary | ICD-10-CM | POA: Diagnosis not present

## 2014-11-03 DIAGNOSIS — Z7901 Long term (current) use of anticoagulants: Secondary | ICD-10-CM | POA: Diagnosis not present

## 2014-11-03 DIAGNOSIS — I8290 Acute embolism and thrombosis of unspecified vein: Secondary | ICD-10-CM | POA: Diagnosis not present

## 2014-11-17 DIAGNOSIS — K589 Irritable bowel syndrome without diarrhea: Secondary | ICD-10-CM | POA: Diagnosis not present

## 2014-11-17 DIAGNOSIS — Z7901 Long term (current) use of anticoagulants: Secondary | ICD-10-CM | POA: Diagnosis not present

## 2014-11-17 DIAGNOSIS — R972 Elevated prostate specific antigen [PSA]: Secondary | ICD-10-CM | POA: Diagnosis not present

## 2014-11-17 DIAGNOSIS — M1990 Unspecified osteoarthritis, unspecified site: Secondary | ICD-10-CM | POA: Diagnosis not present

## 2014-11-17 DIAGNOSIS — Z6823 Body mass index (BMI) 23.0-23.9, adult: Secondary | ICD-10-CM | POA: Diagnosis not present

## 2014-11-17 DIAGNOSIS — M47896 Other spondylosis, lumbar region: Secondary | ICD-10-CM | POA: Diagnosis not present

## 2014-11-17 DIAGNOSIS — I482 Chronic atrial fibrillation: Secondary | ICD-10-CM | POA: Diagnosis not present

## 2014-12-15 DIAGNOSIS — I8290 Acute embolism and thrombosis of unspecified vein: Secondary | ICD-10-CM | POA: Diagnosis not present

## 2014-12-15 DIAGNOSIS — I482 Chronic atrial fibrillation: Secondary | ICD-10-CM | POA: Diagnosis not present

## 2014-12-15 DIAGNOSIS — Z7901 Long term (current) use of anticoagulants: Secondary | ICD-10-CM | POA: Diagnosis not present

## 2015-01-02 DIAGNOSIS — M2550 Pain in unspecified joint: Secondary | ICD-10-CM | POA: Diagnosis not present

## 2015-01-08 ENCOUNTER — Encounter: Payer: No Typology Code available for payment source | Admitting: Internal Medicine

## 2015-01-12 DIAGNOSIS — I8290 Acute embolism and thrombosis of unspecified vein: Secondary | ICD-10-CM | POA: Diagnosis not present

## 2015-01-12 DIAGNOSIS — Z7901 Long term (current) use of anticoagulants: Secondary | ICD-10-CM | POA: Diagnosis not present

## 2015-01-26 DIAGNOSIS — R5383 Other fatigue: Secondary | ICD-10-CM | POA: Diagnosis not present

## 2015-01-26 DIAGNOSIS — R7989 Other specified abnormal findings of blood chemistry: Secondary | ICD-10-CM | POA: Diagnosis not present

## 2015-01-26 DIAGNOSIS — R972 Elevated prostate specific antigen [PSA]: Secondary | ICD-10-CM | POA: Diagnosis not present

## 2015-01-26 DIAGNOSIS — E559 Vitamin D deficiency, unspecified: Secondary | ICD-10-CM | POA: Diagnosis not present

## 2015-01-26 DIAGNOSIS — E889 Metabolic disorder, unspecified: Secondary | ICD-10-CM | POA: Diagnosis not present

## 2015-01-26 DIAGNOSIS — R6889 Other general symptoms and signs: Secondary | ICD-10-CM | POA: Diagnosis not present

## 2015-02-02 DIAGNOSIS — M791 Myalgia: Secondary | ICD-10-CM | POA: Diagnosis not present

## 2015-02-02 DIAGNOSIS — R5382 Chronic fatigue, unspecified: Secondary | ICD-10-CM | POA: Diagnosis not present

## 2015-02-02 DIAGNOSIS — R7989 Other specified abnormal findings of blood chemistry: Secondary | ICD-10-CM | POA: Diagnosis not present

## 2015-02-02 DIAGNOSIS — R972 Elevated prostate specific antigen [PSA]: Secondary | ICD-10-CM | POA: Diagnosis not present

## 2015-02-09 DIAGNOSIS — I482 Chronic atrial fibrillation: Secondary | ICD-10-CM | POA: Diagnosis not present

## 2015-02-09 DIAGNOSIS — I8290 Acute embolism and thrombosis of unspecified vein: Secondary | ICD-10-CM | POA: Diagnosis not present

## 2015-02-09 DIAGNOSIS — Z7901 Long term (current) use of anticoagulants: Secondary | ICD-10-CM | POA: Diagnosis not present

## 2015-03-02 DIAGNOSIS — M5136 Other intervertebral disc degeneration, lumbar region: Secondary | ICD-10-CM | POA: Diagnosis not present

## 2015-03-02 DIAGNOSIS — M5030 Other cervical disc degeneration, unspecified cervical region: Secondary | ICD-10-CM | POA: Diagnosis not present

## 2015-03-02 DIAGNOSIS — G894 Chronic pain syndrome: Secondary | ICD-10-CM | POA: Diagnosis not present

## 2015-03-16 DIAGNOSIS — I8290 Acute embolism and thrombosis of unspecified vein: Secondary | ICD-10-CM | POA: Diagnosis not present

## 2015-03-16 DIAGNOSIS — Z7901 Long term (current) use of anticoagulants: Secondary | ICD-10-CM | POA: Diagnosis not present

## 2015-03-16 DIAGNOSIS — I482 Chronic atrial fibrillation: Secondary | ICD-10-CM | POA: Diagnosis not present

## 2015-04-20 DIAGNOSIS — Z7901 Long term (current) use of anticoagulants: Secondary | ICD-10-CM | POA: Diagnosis not present

## 2015-04-20 DIAGNOSIS — I482 Chronic atrial fibrillation: Secondary | ICD-10-CM | POA: Diagnosis not present

## 2015-05-18 DIAGNOSIS — Z7901 Long term (current) use of anticoagulants: Secondary | ICD-10-CM | POA: Diagnosis not present

## 2015-05-18 DIAGNOSIS — I482 Chronic atrial fibrillation: Secondary | ICD-10-CM | POA: Diagnosis not present

## 2015-05-18 DIAGNOSIS — I8290 Acute embolism and thrombosis of unspecified vein: Secondary | ICD-10-CM | POA: Diagnosis not present

## 2015-06-01 DIAGNOSIS — Z79899 Other long term (current) drug therapy: Secondary | ICD-10-CM | POA: Diagnosis not present

## 2015-06-01 DIAGNOSIS — M1990 Unspecified osteoarthritis, unspecified site: Secondary | ICD-10-CM | POA: Diagnosis not present

## 2015-06-01 DIAGNOSIS — M47896 Other spondylosis, lumbar region: Secondary | ICD-10-CM | POA: Diagnosis not present

## 2015-06-01 DIAGNOSIS — Z1389 Encounter for screening for other disorder: Secondary | ICD-10-CM | POA: Diagnosis not present

## 2015-06-01 DIAGNOSIS — R634 Abnormal weight loss: Secondary | ICD-10-CM | POA: Diagnosis not present

## 2015-06-01 DIAGNOSIS — Z7901 Long term (current) use of anticoagulants: Secondary | ICD-10-CM | POA: Diagnosis not present

## 2015-06-01 DIAGNOSIS — Z6820 Body mass index (BMI) 20.0-20.9, adult: Secondary | ICD-10-CM | POA: Diagnosis not present

## 2015-06-01 DIAGNOSIS — K589 Irritable bowel syndrome without diarrhea: Secondary | ICD-10-CM | POA: Diagnosis not present

## 2015-06-01 DIAGNOSIS — I482 Chronic atrial fibrillation: Secondary | ICD-10-CM | POA: Diagnosis not present

## 2015-06-01 DIAGNOSIS — K219 Gastro-esophageal reflux disease without esophagitis: Secondary | ICD-10-CM | POA: Diagnosis not present

## 2015-06-13 DIAGNOSIS — M5442 Lumbago with sciatica, left side: Secondary | ICD-10-CM | POA: Diagnosis not present

## 2015-06-13 DIAGNOSIS — M50322 Other cervical disc degeneration at C5-C6 level: Secondary | ICD-10-CM | POA: Diagnosis not present

## 2015-06-13 DIAGNOSIS — M5136 Other intervertebral disc degeneration, lumbar region: Secondary | ICD-10-CM | POA: Diagnosis not present

## 2015-06-13 DIAGNOSIS — M5441 Lumbago with sciatica, right side: Secondary | ICD-10-CM | POA: Diagnosis not present

## 2015-06-29 DIAGNOSIS — I482 Chronic atrial fibrillation: Secondary | ICD-10-CM | POA: Diagnosis not present

## 2015-06-29 DIAGNOSIS — Z7901 Long term (current) use of anticoagulants: Secondary | ICD-10-CM | POA: Diagnosis not present

## 2015-06-29 DIAGNOSIS — I8290 Acute embolism and thrombosis of unspecified vein: Secondary | ICD-10-CM | POA: Diagnosis not present

## 2015-07-12 DIAGNOSIS — I872 Venous insufficiency (chronic) (peripheral): Secondary | ICD-10-CM | POA: Diagnosis not present

## 2015-07-12 DIAGNOSIS — L03818 Cellulitis of other sites: Secondary | ICD-10-CM | POA: Diagnosis not present

## 2015-07-12 DIAGNOSIS — S81802A Unspecified open wound, left lower leg, initial encounter: Secondary | ICD-10-CM | POA: Diagnosis not present

## 2015-07-13 DIAGNOSIS — L97909 Non-pressure chronic ulcer of unspecified part of unspecified lower leg with unspecified severity: Secondary | ICD-10-CM | POA: Diagnosis not present

## 2015-07-13 DIAGNOSIS — Z7901 Long term (current) use of anticoagulants: Secondary | ICD-10-CM | POA: Diagnosis not present

## 2015-07-13 DIAGNOSIS — I482 Chronic atrial fibrillation: Secondary | ICD-10-CM | POA: Diagnosis not present

## 2015-07-13 DIAGNOSIS — I8290 Acute embolism and thrombosis of unspecified vein: Secondary | ICD-10-CM | POA: Diagnosis not present

## 2015-07-14 DIAGNOSIS — L03818 Cellulitis of other sites: Secondary | ICD-10-CM | POA: Diagnosis not present

## 2015-07-14 DIAGNOSIS — I872 Venous insufficiency (chronic) (peripheral): Secondary | ICD-10-CM | POA: Diagnosis not present

## 2015-07-14 DIAGNOSIS — S81802D Unspecified open wound, left lower leg, subsequent encounter: Secondary | ICD-10-CM | POA: Diagnosis not present

## 2015-07-18 ENCOUNTER — Encounter: Payer: Medicare Other | Attending: Surgery | Admitting: Surgery

## 2015-07-18 DIAGNOSIS — Z7901 Long term (current) use of anticoagulants: Secondary | ICD-10-CM | POA: Diagnosis not present

## 2015-07-18 DIAGNOSIS — L97321 Non-pressure chronic ulcer of left ankle limited to breakdown of skin: Secondary | ICD-10-CM | POA: Insufficient documentation

## 2015-07-18 DIAGNOSIS — I482 Chronic atrial fibrillation: Secondary | ICD-10-CM | POA: Diagnosis not present

## 2015-07-18 DIAGNOSIS — I872 Venous insufficiency (chronic) (peripheral): Secondary | ICD-10-CM | POA: Diagnosis not present

## 2015-07-18 DIAGNOSIS — I83222 Varicose veins of left lower extremity with both ulcer of calf and inflammation: Secondary | ICD-10-CM | POA: Diagnosis present

## 2015-07-18 DIAGNOSIS — Z950 Presence of cardiac pacemaker: Secondary | ICD-10-CM | POA: Insufficient documentation

## 2015-07-18 DIAGNOSIS — M1990 Unspecified osteoarthritis, unspecified site: Secondary | ICD-10-CM | POA: Diagnosis not present

## 2015-07-18 DIAGNOSIS — L03116 Cellulitis of left lower limb: Secondary | ICD-10-CM | POA: Insufficient documentation

## 2015-07-18 DIAGNOSIS — L97221 Non-pressure chronic ulcer of left calf limited to breakdown of skin: Secondary | ICD-10-CM | POA: Insufficient documentation

## 2015-07-18 DIAGNOSIS — I890 Lymphedema, not elsewhere classified: Secondary | ICD-10-CM | POA: Insufficient documentation

## 2015-07-19 NOTE — Progress Notes (Signed)
Potts, Donald ([id number]) Visit Report for 07/18/2015 Allergy List Details Patient Name: [name], [name]. Date of Service: 07/18/2015 1:30 PM Medical Record Number: [id number] Patient Account Number: [id number] Date of Birth/Sex: Apr 17, 1929 (81 y.o. Male) Treating RN: Huel Coventry Primary Care Physician: Guerry Bruin Other Clinician: Referring Physician: Treating Physician/Extender: BURNS III, Regis Bill in Treatment: 0 Allergies Active Allergies No Known Drug Allergies Allergy Notes Electronic Signature(s) Signed: 07/18/2015 4:57:45 PM By: Elliot Gurney, RN, [name], [name], [name] Entered By: Elliot Gurney, RN, [name], Kim on 07/18/2015 14:32:19 [name] ([id number]) -------------------------------------------------------------------------------- Arrival Information Details Patient Name: [name] Date of Service: 07/18/2015 1:30 PM Medical Record Number: [id number] Patient Account Number: [id number] Date of Birth/Sex: [birth year] (81 y.o. Male) Treating RN: Huel Coventry Primary Care Physician: Guerry Bruin Other Clinician: Referring Physician: Treating Physician/Extender: BURNS III, Regis Bill in Treatment: 0 Visit Information Patient Arrived: Wheel Chair Arrival Time: 14:18 Accompanied By: wife Transfer Assistance: Manual Patient Identification Verified: Yes Secondary Verification Process Yes Completed: Patient Has Alerts: Yes Patient Alerts: Patient on Blood Thinner warfarin NOT Diabetic Electronic Signature(s) Signed: 07/18/2015 4:57:45 PM By: Elliot Gurney, RN, [name], [name], [name] Entered By: Elliot Gurney, RN, [name], Kim on 07/18/2015 14:49:06 [name] ([id number]) -------------------------------------------------------------------------------- Clinic Level of Care Assessment Details Patient Name: [name]. Date of Service: 07/18/2015 1:30 PM Medical Record Number: [id number] Patient Account Number: [id number] Date of Birth/Sex: 1929/07/11 (81 y.o.  Male) Treating RN: Huel Coventry Primary Care Physician: Guerry Bruin Other Clinician: Referring Physician: Treating Physician/Extender: BURNS III, Regis Bill in Treatment: 0 Clinic Level of Care Assessment Items TOOL 2 Quantity Score []  - Use when only an EandM is performed on the INITIAL visit 0 ASSESSMENTS - Nursing Assessment / Reassessment X - General Physical Exam (combine w/ comprehensive assessment (listed just 1 20 below) when performed on new pt. evals) X - Comprehensive Assessment (HX, ROS, Risk Assessments, Wounds Hx, etc.) 1 25 ASSESSMENTS - Wound and Skin Assessment / Reassessment X - Simple Wound Assessment / Reassessment - one wound 1 5 []  - Complex Wound Assessment / Reassessment - multiple wounds 0 []  - Dermatologic / Skin Assessment (not related to wound area) 0 ASSESSMENTS - Ostomy and/or Continence Assessment and Care []  - Incontinence Assessment and Management 0 []  - Ostomy Care Assessment and Management (repouching, etc.) 0 PROCESS - Coordination of Care X - Simple Patient / Family Education for ongoing care 1 15 []  - Complex (extensive) Patient / Family Education for ongoing care 0 X - Staff obtains Chiropractor, Records, Test Results / Process Orders 1 10 []  - Staff telephones HHA, Nursing Homes / Clarify orders / etc 0 []  - Routine Transfer to another Facility (non-emergent condition) 0 []  - Routine Hospital Admission (non-emergent condition) 0 []  - New Admissions / Manufacturing engineer / Ordering NPWT, Apligraf, etc. 0 []  - Emergency Hospital Admission (emergent condition) 0 X - Simple Discharge Coordination 1 10 Fiddler, Willman C. ([id number]) []  - Complex (extensive) Discharge Coordination 0 PROCESS - Special Needs []  - Pediatric / Minor Patient Management 0 []  - Isolation Patient Management 0 []  - Hearing / Language / Visual special needs 0 []  - Assessment of Community assistance (transportation, D/C planning, etc.) 0 []  - Additional assistance  / Altered mentation 0 []  - Support Surface(s) Assessment (bed, cushion, seat, etc.) 0 INTERVENTIONS - Wound Cleansing / Measurement X - Wound Imaging (photographs - any number of wounds) 1 5 []  - Wound Tracing (instead of photographs) 0 X - Simple Wound Measurement - one wound  1 5 []  - Complex Wound Measurement - multiple wounds 0 X - Simple Wound Cleansing - one wound 1 5 []  - Complex Wound Cleansing - multiple wounds 0 INTERVENTIONS - Wound Dressings X - Small Wound Dressing one or multiple wounds 1 10 []  - Medium Wound Dressing one or multiple wounds 0 []  - Large Wound Dressing one or multiple wounds 0 []  - Application of Medications - injection 0 INTERVENTIONS - Miscellaneous []  - External ear exam 0 []  - Specimen Collection (cultures, biopsies, blood, body fluids, etc.) 0 []  - Specimen(s) / Culture(s) sent or taken to Lab for analysis 0 []  - Patient Transfer (multiple staff / Nurse, adult / Similar devices) 0 []  - Simple Staple / Suture removal (25 or less) 0 []  - Complex Staple / Suture removal (26 or more) 0 Kleinpeter, Caelen C. ([id number]) []  - Hypo / Hyperglycemic Management (close monitor of Blood Glucose) 0 []  - Ankle / Brachial Index (ABI) - do not check if billed separately 0 Has the patient been seen at the hospital within the last three years: Yes Total Score: 110 Level Of Care: New/Established - Level 3 Electronic Signature(s) Signed: 07/18/2015 4:57:45 PM By: Elliot Gurney, RN, [name], [name], [name] Entered By: Elliot Gurney, RN, [name], Kim on 07/18/2015 15:10:30 [name] ([id number]) -------------------------------------------------------------------------------- Encounter Discharge Information Details Patient Name: Donald Musca C. Date of Service: 07/18/2015 1:30 PM Medical Record Number: [id number] Patient Account Number: [id number] Date of Birth/Sex: Feb 05, 1929 (81 y.o. Male) Treating RN: Clover Mealy, RN, [name], [place] Sink Primary Care Physician: Guerry Bruin Other  Clinician: Referring Physician: Treating Physician/Extender: BURNS III, Regis Bill in Treatment: 0 Encounter Discharge Information Items Discharge Pain Level: 0 Discharge Condition: Stable Ambulatory Status: Wheelchair Discharge Destination: Home Transportation: Private Auto Accompanied By: wife Schedule Follow-up Appointment: Yes Medication Reconciliation completed and provided to Patient/Care Yes Mattia Liford: Provided on Clinical Summary of Care: 07/18/2015 Form Type Recipient Paper Patient RW Electronic Signature(s) Signed: 07/18/2015 4:57:45 PM By: Elliot Gurney, RN, [name], [name], [name] Previous Signature: 07/18/2015 3:07:53 PM Version By: Gwenlyn Perking Entered By: Elliot Gurney RN, [name], Kim on 07/18/2015 15:14:12 [name] ([id number]) -------------------------------------------------------------------------------- Lower Extremity Assessment Details Patient Name: Donald Musca C. Date of Service: 07/18/2015 1:30 PM Medical Record Number: [id number] Patient Account Number: [id number] Date of Birth/Sex: [birth year] (81 y.o. Male) Treating RN: Huel Coventry Primary Care Physician: Guerry Bruin Other Clinician: Referring Physician: Treating Physician/Extender: BURNS III, Regis Bill in Treatment: 0 Edema Assessment Assessed: [Left: No] [Right: No] Edema: [Left: Ye] [Right: s] Calf Left: Right: Point of Measurement: 33 cm From Medial Instep 37.5 cm cm Ankle Left: Right: Point of Measurement: 12 cm From Medial Instep 25.2 cm cm Vascular Assessment Pulses: Posterior Tibial Palpable: [Left:Yes] Doppler: [Left:Monophasic] Dorsalis Pedis Palpable: [Left:Yes] Doppler: [Left:Monophasic] Extremity colors, hair growth, and conditions: Extremity Color: [Left:Hyperpigmented] Hair Growth on Extremity: [Left:No] Temperature of Extremity: [Left:Warm] Capillary Refill: [Left:< 3 seconds] Toe Nail Assessment Left: Right: Thick: Yes Discolored: Yes Deformed: Yes Improper Length and  Hygiene: No Notes No ABI due to wound location SAFI, CULOTTA ([id number]) Electronic Signature(s) Signed: 07/18/2015 4:57:45 PM By: Elliot Gurney, RN, [name], [name], [name] Entered By: Elliot Gurney, RN, [name], Kim on 07/18/2015 14:28:56 Mangual, [name] ([id number]) -------------------------------------------------------------------------------- Multi Wound Chart Details Patient Name: Donald Musca C. Date of Service: 07/18/2015 1:30 PM Medical Record Number: [id number] Patient Account Number: [id number] Date of Birth/Sex: 09/02/1929 (81 y.o. Male) Treating RN: Huel Coventry Primary Care Physician: Guerry Bruin Other Clinician: Referring Physician: Treating Physician/Extender: BURNS III, WALTER Weeks in Treatment: 0  Vital Signs Height(in): 70 Pulse(bpm): 63 Weight(lbs): 144 Blood Pressure 129/50 (mmHg): Body Mass Index(BMI): 21 Temperature(F): 98.0 Respiratory Rate 16 (breaths/min): Photos: [1:No Photos] [2:No Photos] [N/A:N/A] Wound Location: [1:Left Lower Leg - Anterior] [2:Left Lower Leg - Medial] [N/A:N/A] Wounding Event: [1:Blister] [2:Blister] [N/A:N/A] Primary Etiology: [1:Venous Leg Ulcer] [2:Venous Leg Ulcer] [N/A:N/A] Date Acquired: [1:07/04/2015] [2:07/04/2015] [N/A:N/A] Weeks of Treatment: [1:0] [2:0] [N/A:N/A] Wound Status: [1:Open] [2:Open] [N/A:N/A] Measurements L x W x D 6x5x0.1 [2:2x2.4x0.1] [N/A:N/A] (cm) Area (cm) : [1:23.562] [2:3.77] [N/A:N/A] Volume (cm) : [1:2.356] [2:0.377] [N/A:N/A] Classification: [1:Full Thickness Without Exposed Support Structures] [2:Full Thickness Without Exposed Support Structures] [N/A:N/A] Exudate Amount: [1:Large] [2:Large] [N/A:N/A] Exudate Type: [1:Serous] [2:Serous] [N/A:N/A] Exudate Color: [1:amber] [2:amber] [N/A:N/A] Wound Margin: [1:Indistinct, nonvisible] [2:Flat and Intact] [N/A:N/A] Granulation Amount: [1:Small (1-33%)] [2:None Present (0%)] [N/A:N/A] Granulation Quality: [1:Red] [2:N/A] [N/A:N/A] Necrotic Amount:  [1:Large (67-100%)] [2:Large (67-100%)] [N/A:N/A] Exposed Structures: [1:Fascia: No Fat: No Tendon: No Muscle: No Joint: No Bone: No Limited to Skin Breakdown] [2:Fascia: No Fat: No Tendon: No Muscle: No Joint: No Bone: No Limited to Skin Breakdown] [N/A:N/A] Periwound Skin Texture: [N/A:N/A] Edema: No Edema: No Excoriation: No Excoriation: No Induration: No Induration: No Callus: No Callus: No Crepitus: No Crepitus: No Fluctuance: No Fluctuance: No Friable: No Friable: No Rash: No Rash: No Scarring: No Scarring: No Periwound Skin Moist: Yes Moist: Yes N/A Moisture: Maceration: No Maceration: No Dry/Scaly: No Dry/Scaly: No Periwound Skin Color: Atrophie Blanche: No Atrophie Blanche: No N/A Cyanosis: No Cyanosis: No Ecchymosis: No Ecchymosis: No Erythema: No Erythema: No Hemosiderin Staining: No Hemosiderin Staining: No Mottled: No Mottled: No Pallor: No Pallor: No Rubor: No Rubor: No Tenderness on No No N/A Palpation: Wound Preparation: Ulcer Cleansing: Ulcer Cleansing: N/A Rinsed/Irrigated with Rinsed/Irrigated with Saline Saline Topical Anesthetic Topical Anesthetic Applied: Other: lidocaione Applied: Other: lidociane 4% 4% Treatment Notes Electronic Signature(s) Signed: 07/18/2015 4:57:45 PM By: Elliot Gurney, RN, [name], [name], [name] Entered By: Elliot Gurney, RN, [name], Kim on 07/18/2015 14:55:07 [name] ([id number]) -------------------------------------------------------------------------------- Multi-Disciplinary Care Plan Details Patient Name: [name] Date of Service: 07/18/2015 1:30 PM Medical Record Number: [id number] Patient Account Number: [id number] Date of Birth/Sex: 02-22-1929 (81 y.o. Male) Treating RN: Huel Coventry Primary Care Physician: Guerry Bruin Other Clinician: Referring Physician: Treating Physician/Extender: BURNS III, Regis Bill in Treatment: 0 Active Inactive Orientation to the Wound Care Program Nursing  Diagnoses: Knowledge deficit related to the wound healing center program Goals: Patient/caregiver will verbalize understanding of the Wound Healing Center Program Date Initiated: 07/18/2015 Goal Status: Active Interventions: Provide education on orientation to the wound center Notes: Venous Leg Ulcer Nursing Diagnoses: Actual venous Insuffiency (use after diagnosis is confirmed) Goals: Non-invasive venous studies are completed as ordered Date Initiated: 07/18/2015 Goal Status: Active Interventions: Assess peripheral edema status every visit. Treatment Activities: Non-invasive vascular studies : 07/18/2015 Notes: Wound/Skin Impairment Nursing Diagnoses: Knowledge deficit related to ulceration/compromised skin integrity Koral, Tunis C. ([id number]) Goals: Ulcer/skin breakdown will heal within 14 weeks Date Initiated: 07/18/2015 Goal Status: Active Interventions: Assess patient/caregiver ability to obtain necessary supplies Notes: Electronic Signature(s) Signed: 07/18/2015 4:57:45 PM By: Elliot Gurney, RN, [name], [name], [name] Entered By: Elliot Gurney, RN, [name], Kim on 07/18/2015 14:54:48 Jeanie Sewer, [name] ([id number]) -------------------------------------------------------------------------------- Pain Assessment Details Patient Name: Donald Musca C. Date of Service: 07/18/2015 1:30 PM Medical Record Number: [id number] Patient Account Number: [id number] Date of Birth/Sex: 30-Mar-1929 (81 y.o. Male) Treating RN: Huel Coventry Primary Care Physician: Guerry Bruin Other Clinician: Referring Physician: Treating Physician/Extender: BURNS III, WALTER Weeks in Treatment: 0 Active  Problems Location of Pain Severity and Description of Pain Patient Has Paino No Site Locations Pain Management and Medication Current Pain Management: Electronic Signature(s) Signed: 07/18/2015 4:57:45 PM By: Elliot Gurney, RN, [name], [name], [name] Entered By: Elliot Gurney, RN, [name], Kim on 07/18/2015 14:21:09 [name]  ([id number]) -------------------------------------------------------------------------------- Patient/Caregiver Education Details Patient Name: [name]. Date of Service: 07/18/2015 1:30 PM Medical Record Number: [id number] Patient Account Number: [id number] Date of Birth/Gender: 1929-03-02 (81 y.o. Male) Treating RN: Huel Coventry Primary Care Physician: Guerry Bruin Other Clinician: Referring Physician: Treating Physician/Extender: BURNS III, Regis Bill in Treatment: 0 Education Assessment Education Provided To: Patient Education Topics Provided Wound/Skin Impairment: Handouts: Caring for Your Ulcer, Other: wound care as prescribed Methods: Demonstration, Explain/Verbal Responses: State content correctly Electronic Signature(s) Signed: 07/18/2015 4:57:45 PM By: Elliot Gurney, RN, [name], [name], [name] Entered By: Elliot Gurney, RN, [name], Kim on 07/18/2015 15:14:30 [name] ([id number]) -------------------------------------------------------------------------------- Wound Assessment Details Patient Name: Donald Musca C. Date of Service: 07/18/2015 1:30 PM Medical Record Number: [id number] Patient Account Number: [id number] Date of Birth/Sex: [birth year] (81 y.o. Male) Treating RN: Huel Coventry Primary Care Physician: Guerry Bruin Other Clinician: Referring Physician: Treating Physician/Extender: BURNS III, WALTER Weeks in Treatment: 0 Wound Status Wound Number: 1 Primary Etiology: Venous Leg Ulcer Wound Location: Left Lower Leg - Anterior Wound Status: Open Wounding Event: Blister Date Acquired: 07/04/2015 Weeks Of Treatment: 0 Clustered Wound: No Photos Photo Uploaded By: Elliot Gurney, RN, [name], Kim on 07/18/2015 16:01:50 Wound Measurements Length: (cm) 6 % Reduction i Width: (cm) 5 % Reduction i Depth: (cm) 0.1 Area: (cm) 23.562 Volume: (cm) 2.356 n Area: n Volume: Wound Description Full Thickness Without Exposed Classification: Support Structures Wound  Margin: Indistinct, nonvisible Exudate Large Amount: Exudate Type: Serous Exudate Color: amber Wound Bed Granulation Amount: Small (1-33%) Exposed Structure Granulation Quality: Red Fascia Exposed: No Necrotic Amount: Large (67-100%) Fat Layer Exposed: No Obara, Rodolphe C. ([id number]) Necrotic Quality: Adherent Slough Tendon Exposed: No Muscle Exposed: No Joint Exposed: No Bone Exposed: No Limited to Skin Breakdown Periwound Skin Texture Texture Color No Abnormalities Noted: No No Abnormalities Noted: No Callus: No Atrophie Blanche: No Crepitus: No Cyanosis: No Excoriation: No Ecchymosis: No Fluctuance: No Erythema: No Friable: No Hemosiderin Staining: No Induration: No Mottled: No Localized Edema: No Pallor: No Rash: No Rubor: No Scarring: No Moisture No Abnormalities Noted: No Dry / Scaly: No Maceration: No Moist: Yes Wound Preparation Ulcer Cleansing: Rinsed/Irrigated with Saline Topical Anesthetic Applied: Other: lidocaione 4%, Treatment Notes Wound #1 (Left, Anterior Lower Leg) 1. Cleansed with: Clean wound with Normal Saline 2. Anesthetic Topical Lidocaine 4% cream to wound bed prior to debridement 4. Dressing Applied: Aquacel Ag 5. Secondary Dressing Applied Bordered Foam Dressing ABD and Kerlix/Conform 7. Secured with Museum/gallery exhibitions officer) Signed: 07/18/2015 4:57:45 PM By: Elliot Gurney, RN, [name], [name], [name] Entered By: Elliot Gurney, RN, [name], Kim on 07/18/2015 14:30:33 [name] ([id number]) Jeanie Sewer, [name] ([id number]) -------------------------------------------------------------------------------- Wound Assessment Details Patient Name: Donald Musca C. Date of Service: 07/18/2015 1:30 PM Medical Record Number: [id number] Patient Account Number: [id number] Date of Birth/Sex: Sep 21, 1928 (81 y.o. Male) Treating RN: Huel Coventry Primary Care Physician: Guerry Bruin Other Clinician: Referring Physician: Treating  Physician/Extender: BURNS III, WALTER Weeks in Treatment: 0 Wound Status Wound Number: 2 Primary Etiology: Venous Leg Ulcer Wound Location: Left Lower Leg - Medial Wound Status: Open Wounding Event: Blister Date Acquired: 07/04/2015 Weeks Of Treatment: 0 Clustered Wound: No Photos Photo Uploaded By: Elliot Gurney, RN, [name], Kim on 07/18/2015 16:01:50 Wound  Measurements Length: (cm) 2 % Reduction i Width: (cm) 2.4 % Reduction i Depth: (cm) 0.1 Area: (cm) 3.77 Volume: (cm) 0.377 n Area: n Volume: Wound Description Full Thickness Without Exposed Classification: Support Structures Wound Margin: Flat and Intact Exudate Large Amount: Exudate Type: Serous Exudate Color: amber Wound Bed Granulation Amount: None Present (0%) Exposed Structure Necrotic Amount: Large (67-100%) Fascia Exposed: No Necrotic Quality: Adherent Slough Fat Layer Exposed: No Fenley, Gregori C. ([id number]) Tendon Exposed: No Muscle Exposed: No Joint Exposed: No Bone Exposed: No Limited to Skin Breakdown Periwound Skin Texture Texture Color No Abnormalities Noted: No No Abnormalities Noted: No Callus: No Atrophie Blanche: No Crepitus: No Cyanosis: No Excoriation: No Ecchymosis: No Fluctuance: No Erythema: No Friable: No Hemosiderin Staining: No Induration: No Mottled: No Localized Edema: No Pallor: No Rash: No Rubor: No Scarring: No Moisture No Abnormalities Noted: No Dry / Scaly: No Maceration: No Moist: Yes Wound Preparation Ulcer Cleansing: Rinsed/Irrigated with Saline Topical Anesthetic Applied: Other: lidociane 4%, Treatment Notes Wound #2 (Left, Medial Lower Leg) 1. Cleansed with: Clean wound with Normal Saline 2. Anesthetic Topical Lidocaine 4% cream to wound bed prior to debridement 4. Dressing Applied: Aquacel Ag 5. Secondary Dressing Applied Bordered Foam Dressing ABD and Kerlix/Conform 7. Secured with Museum/gallery exhibitions officeraper tape Tubigrip Electronic Signature(s) Signed:  07/18/2015 4:57:45 PM By: Elliot GurneyWoody, RN, [name], [name], [name] Entered By: Elliot GurneyWoody, RN, [name], Kim on 07/18/2015 14:31:59 Donald MouseWILLIFORD, Harvir C. ([id number]) Jeanie SewerWILLIFORD, [name] ([id number]) -------------------------------------------------------------------------------- Wound Assessment Details Patient Name: Donald MuscaWILLIFORD, Broox C. Date of Service: 07/18/2015 1:30 PM Medical Record Number: [id number] Patient Account Number: [id number] Date of Birth/Sex: 09/19/1928 78(81 y.o. Male) Treating RN: Huel CoventryWoody, Kim Primary Care Physician: Guerry BruinISOVEC, RICHARD Other Clinician: Referring Physician: Treating Physician/Extender: BURNS III, WALTER Weeks in Treatment: 0 Wound Status Wound Number: 3 Primary Venous Leg Ulcer Etiology: Wound Location: Left Malleolus - Medial Wound Open Wounding Event: Gradually Appeared Status: Date Acquired: 05/16/2015 Comorbid Cataracts, Arrhythmia, Deep Vein Weeks Of Treatment: 0 History: Thrombosis, Osteoarthritis Clustered Wound: No Wound Measurements Length: (cm) 0.3 % Reduction i Width: (cm) 0.4 % Reduction i Depth: (cm) 0.1 Area: (cm) 0.094 Volume: (cm) 0.009 n Area: n Volume: Wound Description Classification: Partial Thickness Wound Margin: Flat and Intact Exudate Amount: Large Exudate Type: Serous Exudate Color: amber Wound Bed Granulation Amount: Small (1-33%) Exposed Structure Granulation Quality: Pink Fascia Exposed: No Necrotic Amount: Medium (34-66%) Fat Layer Exposed: No Necrotic Quality: Adherent Slough Tendon Exposed: No Muscle Exposed: No Joint Exposed: No Bone Exposed: No Limited to Skin Breakdown Periwound Skin Texture Texture Color No Abnormalities Noted: No No Abnormalities Noted: No Callus: No Atrophie Blanche: No Crepitus: No Cyanosis: No Excoriation: No Ecchymosis: No Fluctuance: No Erythema: No Sobczak, Derren C. ([id number]) Friable: No Hemosiderin Staining: No Induration: No Mottled: No Localized Edema: No Pallor: No Rash:  No Rubor: No Scarring: No Moisture No Abnormalities Noted: No Dry / Scaly: No Maceration: No Moist: Yes Wound Preparation Ulcer Cleansing: Rinsed/Irrigated with Saline Topical Anesthetic Applied: None Treatment Notes Wound #3 (Left, Medial Malleolus) 1. Cleansed with: Clean wound with Normal Saline 2. Anesthetic Topical Lidocaine 4% cream to wound bed prior to debridement 4. Dressing Applied: Aquacel Ag 5. Secondary Dressing Applied Bordered Foam Dressing ABD and Kerlix/Conform 7. Secured with Museum/gallery exhibitions officeraper tape Tubigrip Electronic Signature(s) Signed: 07/18/2015 4:57:45 PM By: Elliot GurneyWoody, RN, [name], [name], [name] Entered By: Elliot GurneyWoody, RN, [name], Kim on 07/18/2015 15:12:00 Donald MouseWILLIFORD, Omari C. ([id number]) -------------------------------------------------------------------------------- Vitals Details Patient Name: Donald MuscaWILLIFORD, [place] C. Date of Service: 07/18/2015 1:30 PM Medical Record Number:  [id number] Patient Account Number: [id number] Date of Birth/Sex: Jan 09, 1929 (81 y.o. Male) Treating RN: Huel Coventry Primary Care Physician: Guerry Bruin Other Clinician: Referring Physician: Treating Physician/Extender: BURNS III, Regis Bill in Treatment: 0 Vital Signs Time Taken: 14:21 Temperature (F): 98.0 Height (in): 70 Pulse (bpm): 63 Source: Stated Respiratory Rate (breaths/min): 16 Weight (lbs): 144 Blood Pressure (mmHg): 129/50 Source: Stated Reference Range: 80 - 120 mg / dl Body Mass Index (BMI): 20.7 Electronic Signature(s) Signed: 07/18/2015 4:57:45 PM By: Elliot Gurney, RN, [name], [name], [name] Entered By: Elliot Gurney, RN, [name], Kim on 07/18/2015 14:22:11

## 2015-07-19 NOTE — Progress Notes (Signed)
[name], [name] ([id number]) Visit Report for 07/18/2015 Abuse/Suicide Risk Screen Details Patient Name: [name], [name]. Date of Service: 07/18/2015 1:30 PM Medical Record Number: [id number] Patient Account Number: [id number] Date of Birth/Sex: [birth year] (81 y.o. Male) Treating RN: [name] Primary Care Physician: Guerry Bruin Other Clinician: Referring Physician: Treating Physician/Extender: BURNS III, WALTER Weeks in Treatment: 0 Abuse/Suicide Risk Screen Items Answer ABUSE/SUICIDE RISK SCREEN: Has anyone close to you tried to hurt or harm you recentlyo No Do you feel uncomfortable with anyone in your familyo No Has anyone forced you do things that you didnot want to doo No Do you have any thoughts of harming yourselfo No Patient displays signs or symptoms of abuse and/or neglect. No Electronic Signature(s) Signed: 07/18/2015 4:57:45 PM By: Donald Gurney, RN, [name], [name], [name] Entered By: [name] on 07/18/2015 14:38:42 [name] ([id number]) -------------------------------------------------------------------------------- Activities of Daily Living Details Patient Name: [name], [name] Date of Service: 07/18/2015 1:30 PM Medical Record Number: [id number] Patient Account Number: [id number] Date of Birth/Sex: Aug 23, 1929 (81 y.o. Male) Treating RN: [name] Primary Care Physician: Guerry Bruin Other Clinician: Referring Physician: Treating Physician/Extender: BURNS III, Regis Bill in Treatment: 0 Activities of Daily Living Items Answer Activities of Daily Living (Please select one for each item) Drive Automobile Not Able Take Medications Completely Able Use Telephone Completely Able Care for Appearance Completely Able Use Toilet Completely Able Bath / Shower Completely Able Dress Self Completely Able Feed Self Completely Able Walk Completely Able Get In / Out Bed Completely Able Housework Completely Able Prepare Meals Completely  Able Handle Money Completely Able Shop for Self Completely Able Electronic Signature(s) Signed: 07/18/2015 4:57:45 PM By: Donald Gurney, RN, [name], [name], [name] Entered By: [name] on 07/18/2015 14:39:01 [name] ([id number]) -------------------------------------------------------------------------------- Education Assessment Details Patient Name: Donald Musca C. Date of Service: 07/18/2015 1:30 PM Medical Record Number: [id number] Patient Account Number: [id number] Date of Birth/Sex: [birth year] (81 y.o. Male) Treating RN: [name] Primary Care Physician: Guerry Bruin Other Clinician: Referring Physician: Treating Physician/Extender: BURNS III, Regis Bill in Treatment: 0 Learning Preferences/Education Level/Primary Language Learning Preference: Explanation Highest Education Level: College or Above Preferred Language: English Cognitive Barrier Assessment/Beliefs Language Barrier: No Translator Needed: No Memory Deficit: Yes Emotional Barrier: No Physical Barrier Assessment Impaired Vision: Yes Glasses Impaired Hearing: No Decreased Hand dexterity: No Knowledge/Comprehension Assessment Knowledge Level: Medium Comprehension Level: Medium Ability to understand written Medium instructions: Ability to understand verbal Medium instructions: Motivation Assessment Anxiety Level: Calm Cooperation: Cooperative Education Importance: Acknowledges Need Interest in Health Problems: Asks Questions Perception: Coherent Willingness to Engage in Self- High Management Activities: Readiness to Engage in Self- High Management Activities: Electronic Signature(s) Signed: 07/18/2015 4:57:45 PM By: Donald Gurney, RN, [name], [name], [name] Entered By: [name] on 07/18/2015 14:39:50 [name] ([id number]) [name] ([id number]) -------------------------------------------------------------------------------- Fall Risk Assessment Details Patient Name:  Donald Musca C. Date of Service: 07/18/2015 1:30 PM Medical Record Number: [id number] Patient Account Number: [id number] Date of Birth/Sex: Feb 01, 1929 (81 y.o. Male) Treating RN: [name] Primary Care Physician: Guerry Bruin Other Clinician: Referring Physician: Treating Physician/Extender: BURNS III, Regis Bill in Treatment: 0 Fall Risk Assessment Items FALL RISK ASSESSMENT: History of falling - immediate or within 3 months 0 No Secondary diagnosis 0 No Ambulatory aid None/bed rest/wheelchair/nurse 0 Yes Crutches/cane/walker 0 No Furniture 0 No IV Access/Saline Lock 0 No Gait/Training Normal/bed rest/immobile 0 Yes Weak 0 No Impaired 0 No Mental Status Oriented to own ability 0 Yes  Electronic Signature(s) Signed: 07/18/2015 4:57:45 PM By: Donald GurneyWoody, RN, [name], [name], [name] Entered By: Donald GurneyWoody, RN, [name], Potts on 07/18/2015 14:40:00 Donald MouseWILLIFORD, [name] ([id number]) -------------------------------------------------------------------------------- Foot Assessment Details Patient Name: Donald MuscaWILLIFORD, [name] Date of Service: 07/18/2015 1:30 PM Medical Record Number: [id number] Patient Account Number: [id number] Date of Birth/Sex: 05/01/1929 30(81 y.o. Male) Treating RN: Donald CoventryWoody, Potts Primary Care Physician: Guerry BruinISOVEC, RICHARD Other Clinician: Referring Physician: Treating Physician/Extender: BURNS III, WALTER Weeks in Treatment: 0 Foot Assessment Items Site Locations + = Sensation present, - = Sensation absent, C = Callus, U = Ulcer R = Redness, W = Warmth, M = Maceration, PU = Pre-ulcerative lesion F = Fissure, S = Swelling, D = Dryness Assessment Right: Left: Other Deformity: No No Prior Foot Ulcer: No No Prior Amputation: No No Charcot Joint: No No Ambulatory Status: Ambulatory With Help Assistance Device: Walker Gait: Surveyor, miningUnsteady Electronic Signature(s) Signed: 07/18/2015 4:57:45 PM By: Donald GurneyWoody, RN, [name], [name], [name] Entered By: Donald GurneyWoody, RN, [name], Potts on 07/18/2015 14:40:42 [name],  [name] ([id number]) -------------------------------------------------------------------------------- Nutrition Risk Assessment Details Patient Name: Donald MuscaWILLIFORD, Burdett C. Date of Service: 07/18/2015 1:30 PM Medical Record Number: [id number] Patient Account Number: [id number] Date of Birth/Sex: 03/01/1929 [age] y.o. Male) Treating RN: Donald CoventryWoody, Potts Primary Care Physician: Guerry BruinISOVEC, RICHARD Other Clinician: Referring Physician: Treating Physician/Extender: BURNS III, WALTER Weeks in Treatment: 0 Height (in): 70 Weight (lbs): 144 Body Mass Index (BMI): 20.7 Nutrition Risk Assessment Items NUTRITION RISK SCREEN: I have an illness or condition that made me change the kind and/or 0 No amount of food I eat I eat fewer than two meals per day 0 No I eat few fruits and vegetables, or milk products 0 No I have three or more drinks of beer, liquor or wine almost every day 0 No I have tooth or mouth problems that make it hard for me to eat 0 No I don't always have enough money to buy the food I need 0 No I eat alone most of the time 0 No I take three or more different prescribed or over-the-counter drugs a 0 No day Without wanting to, I have lost or gained 10 pounds in the last six 0 No months I am not always physically able to shop, cook and/or feed myself 0 No Nutrition Protocols Good Risk Protocol 0 No interventions needed Moderate Risk Protocol Electronic Signature(s) Signed: 07/18/2015 4:57:45 PM By: Donald GurneyWoody, RN, [name], [name], [name] Entered By: Donald GurneyWoody, RN, [name], Potts on 07/18/2015 14:40:07

## 2015-07-19 NOTE — Progress Notes (Signed)
[name], [name] ([id number]) Visit Report for 07/18/2015 Chief Complaint Document Details Patient Name: [name], [name] Date of Service: 07/18/2015 1:30 PM Medical Record Number: [id number] Patient Account Number: [id number] Date of Birth/Sex: 09/13/1928 [age] y.o. Male) Treating RN: Clover MealyAfful, RN, [name], [place] Primary Care Physician: Guerry BruinISOVEC, RICHARD Other Clinician: Referring Physician: Treating Physician/Extender: BURNS III, Regis BillWALTER Weeks in Treatment: 0 Information Obtained from: Patient Chief Complaint Left calf ulcerations and cellulitis. Electronic Signature(s) Signed: 07/18/2015 4:26:05 PM By: Madelaine BhatBurns, III, Walter MD Entered By: Madelaine BhatBurns, III, Walter on 07/18/2015 16:20:06 [name], [name] ([id number]) -------------------------------------------------------------------------------- HPI Details Patient Name: [name], [name] Date of Service: 07/18/2015 1:30 PM Medical Record Number: [id number] Patient Account Number: [id number] Date of Birth/Sex: 05/14/1929 [age] y.o. Male) Treating RN: Clover MealyAfful, RN, [name], [place] Sinkita Primary Care Physician: Guerry BruinISOVEC, RICHARD Other Clinician: Referring Physician: Treating Physician/Extender: BURNS III, WALTER Weeks in Treatment: 0 History of Present Illness HPI Description: Pleasant 81 year old with history of chronic venous insufficiency, remote history of DVT, atrial fibrillation (on Coumadin). No history of diabetes. He says that he developed worsening left lower extremity swelling, blisters, and subsequent ulcerations in late October 2016. He was started on Augmentin for left lower extremity cellulitis, which he says has improved. He denies any significant pain. No claudication or rest pain. No ABI obtained today. Ambulating per his baseline with a walker. No fever or chills. Moderate drainage. Electronic Signature(s) Signed: 07/18/2015 4:26:05 PM By: Madelaine BhatBurns, III, Walter MD Entered By: Madelaine BhatBurns, III, Walter on 07/18/2015 16:22:14 [name], [name]  ([id number]) -------------------------------------------------------------------------------- Physical Exam Details Patient Name: [name], [name] Date of Service: 07/18/2015 1:30 PM Medical Record Number: [id number] Patient Account Number: [id number] Date of Birth/Sex: 06/06/1929 [age] y.o. Male) Treating RN: Clover MealyAfful, RN, [name], [place] Primary Care Physician: Guerry BruinISOVEC, RICHARD Other Clinician: Referring Physician: Treating Physician/Extender: BURNS III, WALTER Weeks in Treatment: 0 Constitutional . Pulse regular. Respirations normal and unlabored. Afebrile. Marland Kitchen. Respiratory WNL. No retractions.. Cardiovascular . Integumentary (Hair, Skin) .Marland Kitchen. Neurological Sensation normal to touch, pin,and vibration. Psychiatric Judgement and insight Intact.. Oriented times 3.. No evidence of depression, anxiety, or agitation.. Notes Multiple left calf ulcerations. Partial full-thickness. Biofilm wiped free with gauze. No surgical debridement required. Moderate cellulitis. 2+ pitting edema. No palpable pedal pulses. Dopplerable DP and PT. Electronic Signature(s) Signed: 07/18/2015 4:26:05 PM By: Madelaine BhatBurns, III, Walter MD Entered By: Madelaine BhatBurns, III, Walter on 07/18/2015 16:23:16 [name], [name] ([id number]) -------------------------------------------------------------------------------- Physician Orders Details Patient Name: [name], [name] Date of Service: 07/18/2015 1:30 PM Medical Record Number: [id number] Patient Account Number: [id number] Date of Birth/Sex: 12/03/1928 [age] y.o. Male) Treating RN: Huel CoventryWoody, Kim Primary Care Physician: Guerry BruinISOVEC, RICHARD Other Clinician: Referring Physician: Treating Physician/Extender: BURNS III, Regis BillWALTER Weeks in Treatment: 0 Verbal / Phone Orders: Yes Clinician: Huel CoventryWoody, Kim Read Back and Verified: Yes Diagnosis Coding Wound Cleansing Wound #1 Left,Anterior Lower Leg o Clean wound with Normal Saline. o May Shower, gently pat wound dry prior to applying new  dressing. Wound #2 Left,Medial Lower Leg o Clean wound with Normal Saline. o May Shower, gently pat wound dry prior to applying new dressing. Wound #3 Left,Medial Malleolus o Clean wound with Normal Saline. o May Shower, gently pat wound dry prior to applying new dressing. Anesthetic Wound #1 Left,Anterior Lower Leg o Topical Lidocaine 4% cream applied to wound bed prior to debridement Wound #2 Left,Medial Lower Leg o Topical Lidocaine 4% cream applied to wound bed prior to debridement Wound #3 Left,Medial Malleolus o Topical Lidocaine 4% cream applied to wound bed prior to debridement Primary Wound Dressing Wound #  1 Left,Anterior Lower Leg o Aquacel Ag Wound #2 Left,Medial Lower Leg o Aquacel Ag Wound #3 Left,Medial Malleolus o Aquacel Ag Secondary Dressing Wound #1 Left,Anterior Lower Leg o ABD and Kerlix/Conform [name], [name] ([id number]) Wound #2 Left,Medial Lower Leg o ABD and Kerlix/Conform Wound #3 Left,Medial Malleolus o Boardered Foam Dressing Dressing Change Frequency Wound #1 Left,Anterior Lower Leg o Change dressing every other day. Wound #2 Left,Medial Lower Leg o Change dressing every other day. Follow-up Appointments Wound #1 Left,Anterior Lower Leg o Return Appointment in 1 week. Wound #2 Left,Medial Lower Leg o Return Appointment in 1 week. Edema Control Wound #1 Left,Anterior Lower Leg o Elevate legs to the level of the heart and pump ankles as often as possible o Tubigrip Wound #2 Left,Medial Lower Leg o Elevate legs to the level of the heart and pump ankles as often as possible o Tubigrip Additional Orders / Instructions Wound #1 Left,Anterior Lower Leg o Increase protein intake. Wound #2 Left,Medial Lower Leg o Increase protein intake. Services and Therapies o Venous Studies -Unilateral - left oooo TYVON, EGGENBERGER ([id number]) o Arterial Studies- Unilateral - left oooo Electronic  Signature(s) Signed: 07/18/2015 4:26:05 PM By: Madelaine Bhat MD Signed: 07/18/2015 4:57:45 PM By: Elliot Gurney, RN, [name], [name], [name] Entered By: Elliot Gurney, RN, [name], Kim on 07/18/2015 15:23:34 Donald Mouse ([id number]) -------------------------------------------------------------------------------- Problem List Details Patient Name: AVINASH, MALTOS C. Date of Service: 07/18/2015 1:30 PM Medical Record Number: [id number] Patient Account Number: [id number] Date of Birth/Sex: September 15, 1928 (81 y.o. 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 07/18/2015 Yes calf and inflammation I89.0 Lymphedema, not elsewhere classified 07/18/2015 Yes I87.2 Venous insufficiency (chronic) (peripheral) 07/18/2015 Yes L03.116 Cellulitis of left lower limb 07/18/2015 Yes I48.2 Chronic atrial fibrillation 07/18/2015 Yes Z79.01 Long term (current) use of anticoagulants 07/18/2015 Yes Inactive Problems Resolved Problems Electronic Signature(s) Signed: 07/18/2015 4:26:05 PM By: Madelaine Bhat MD Entered By: Madelaine Bhat on 07/18/2015 16:18:57 Bayer, [name] ([id number]) -------------------------------------------------------------------------------- Progress Note/History and Physical Details Patient Name: Donald Musca C. Date of Service: 07/18/2015 1:30 PM Medical Record Number: [id number] Patient Account Number: [id number] Date of Birth/Sex: [birth year] (81 y.o. Male) Treating RN: Clover Mealy, RN, [name], [place] Primary Care Physician: Guerry Bruin Other Clinician: Referring Physician: Treating Physician/Extender: BURNS III, Regis Bill in Treatment: 0 Subjective Chief Complaint Information obtained from Patient Left calf  ulcerations and cellulitis. History of Present Illness (HPI) Pleasant 81 year old with history of chronic venous insufficiency, remote history of DVT, atrial fibrillation (on Coumadin). No history of diabetes. He says that he developed worsening left lower extremity swelling, blisters, and subsequent ulcerations in late October 2016. He was started on Augmentin for left lower extremity cellulitis, which he says has improved. He denies any significant pain. No claudication or rest pain. No ABI obtained today. Ambulating per his baseline with a walker. No fever or chills. Moderate drainage. Wound History Patient presents with 2 open wounds that have been present for approximately 2weeks. Patient has been treating wounds in the following manner: vasaline gauze; wound cleaner. Laboratory tests have not been performed in the last month. Patient reportedly has not tested positive for an antibiotic resistant organism. Patient reportedly has not tested positive for osteomyelitis. Patient reportedly has had testing performed to evaluate circulation in the legs. Patient experiences the following  problems associated with their wounds: swelling. Patient History Information obtained from Patient. Allergies No Known Drug Allergies Family History Cancer - Father, No family history of Diabetes, Heart Disease, Hypertension, Kidney Disease, Lung Disease, Seizures, Stroke, Thyroid Problems. Social History Never smoker, Marital Status - Married, Alcohol Use - Never, Drug Use - No History, Caffeine Use - Daily. ANDRAE, CLAUNCH ([id number]) Medical History Eyes Patient has history of Cataracts - years ago Denies history of Glaucoma, Optic Neuritis Ear/Nose/Mouth/Throat Denies history of Chronic sinus problems/congestion, Middle ear problems Hematologic/Lymphatic Denies history of Anemia, Hemophilia, Human Immunodeficiency Virus, Lymphedema, Sickle Cell Disease Respiratory Denies history of  Aspiration, Asthma, Chronic Obstructive Pulmonary Disease (COPD), Pneumothorax, Sleep Apnea, Tuberculosis Cardiovascular Patient has history of Arrhythmia - pacemaker, Deep Vein Thrombosis - both legs 2003 Denies history of Angina, Congestive Heart Failure, Coronary Artery Disease, Hypertension, Hypotension, Myocardial Infarction, Peripheral Arterial Disease, Peripheral Venous Disease, Phlebitis, Vasculitis Gastrointestinal Denies history of Cirrhosis , Colitis, Crohn s, Hepatitis A, Hepatitis B, Hepatitis C Endocrine Denies history of Type I Diabetes, Type II Diabetes Genitourinary Denies history of End Stage Renal Disease Immunological Denies history of Lupus Erythematosus, Raynaud s, Scleroderma Integumentary (Skin) Denies history of History of Burn, History of pressure wounds Musculoskeletal Patient has history of Osteoarthritis Denies history of Gout, Rheumatoid Arthritis, Osteomyelitis Neurologic Denies history of Dementia, Neuropathy, Quadriplegia, Paraplegia, Seizure Disorder Oncologic Denies history of Received Chemotherapy, Received Radiation Psychiatric Denies history of Anorexia/bulimia, Confinement Anxiety Medical And Surgical History Notes Constitutional Symptoms (General Health) A-fib; blood clots in legs; arthritis; leg, back and hip pain Review of Systems (ROS) Constitutional Symptoms (General Health) The patient has no complaints or symptoms. Eyes The patient has no complaints or symptoms. Ear/Nose/Mouth/Throat The patient has no complaints or symptoms. Hematologic/Lymphatic The patient has no complaints or symptoms. Respiratory The patient has no complaints or symptoms. Cardiovascular Turney, Trebor C. ([id number]) Complains or has symptoms of LE edema. Gastrointestinal The patient has no complaints or symptoms. Endocrine Denies complaints or symptoms of Hepatitis, Thyroid disease, Polydypsia (Excessive Thirst). Genitourinary Denies complaints or  symptoms of Kidney failure/ Dialysis, Incontinence/dribbling. Immunological Denies complaints or symptoms of Hives, Itching. Integumentary (Skin) Complains or has symptoms of Wounds, Bleeding or bruising tendency. Denies complaints or symptoms of Breakdown, Swelling. Musculoskeletal Complains or has symptoms of Muscle Pain - legs, Muscle Weakness - legs. Neurologic The patient has no complaints or symptoms. Oncologic The patient has no complaints or symptoms. Psychiatric The patient has no complaints or symptoms. Objective Constitutional Pulse regular. Respirations normal and unlabored. Afebrile. Vitals Time Taken: 2:21 PM, Height: 70 in, Source: Stated, Weight: 144 lbs, Source: Stated, BMI: 20.7, Temperature: 98.0 F, Pulse: 63 bpm, Respiratory Rate: 16 breaths/min, Blood Pressure: 129/50 mmHg. Respiratory WNL. No retractions.. Neurological Sensation normal to touch, pin,and vibration. Psychiatric Judgement and insight Intact.. Oriented times 3.. No evidence of depression, anxiety, or agitation.. General Notes: Multiple left calf ulcerations. Partial full-thickness. Biofilm wiped free with gauze. No surgical debridement required. Moderate cellulitis. 2+ pitting edema. No palpable pedal pulses. Dopplerable DP and PT. Integumentary (Hair, Skin) Veiga, Constantin C. ([id number]) Wound #1 status is Open. Original cause of wound was Blister. The wound is located on the Left,Anterior Lower Leg. The wound measures 6cm length x 5cm width x 0.1cm depth; 23.562cm^2 area and 2.356cm^3 volume. The wound is limited to skin breakdown. There is a large amount of serous drainage noted. The wound margin is indistinct and nonvisible. There is small (1-33%) red granulation within the wound bed. There is a large (67-100%)  amount of necrotic tissue within the wound bed including Adherent Slough. The periwound skin appearance exhibited: Moist. The periwound skin appearance did not exhibit:  Callus, Crepitus, Excoriation, Fluctuance, Friable, Induration, Localized Edema, Rash, Scarring, Dry/Scaly, Maceration, Atrophie Blanche, Cyanosis, Ecchymosis, Hemosiderin Staining, Mottled, Pallor, Rubor, Erythema. Wound #2 status is Open. Original cause of wound was Blister. The wound is located on the Left,Medial Lower Leg. The wound measures 2cm length x 2.4cm width x 0.1cm depth; 3.77cm^2 area and 0.377cm^3 volume. The wound is limited to skin breakdown. There is a large amount of serous drainage noted. The wound margin is flat and intact. There is no granulation within the wound bed. There is a large (67-100%) amount of necrotic tissue within the wound bed including Adherent Slough. The periwound skin appearance exhibited: Moist. The periwound skin appearance did not exhibit: Callus, Crepitus, Excoriation, Fluctuance, Friable, Induration, Localized Edema, Rash, Scarring, Dry/Scaly, Maceration, Atrophie Blanche, Cyanosis, Ecchymosis, Hemosiderin Staining, Mottled, Pallor, Rubor, Erythema. Wound #3 status is Open. Original cause of wound was Gradually Appeared. The wound is located on the Left,Medial Malleolus. The wound measures 0.3cm length x 0.4cm width x 0.1cm depth; 0.094cm^2 area and 0.009cm^3 volume. The wound is limited to skin breakdown. There is a large amount of serous drainage noted. The wound margin is flat and intact. There is small (1-33%) pink granulation within the wound bed. There is a medium (34-66%) amount of necrotic tissue within the wound bed including Adherent Slough. The periwound skin appearance exhibited: Moist. The periwound skin appearance did not exhibit: Callus, Crepitus, Excoriation, Fluctuance, Friable, Induration, Localized Edema, Rash, Scarring, Dry/Scaly, Maceration, Atrophie Blanche, Cyanosis, Ecchymosis, Hemosiderin Staining, Mottled, Pallor, Rubor, Erythema. Assessment Active Problems ICD-10 I83.222 - Varicose veins of left lower extremity with both  ulcer of calf and inflammation I89.0 - Lymphedema, not elsewhere classified I87.2 - Venous insufficiency (chronic) (peripheral) L03.116 - Cellulitis of left lower limb I48.2 - Chronic atrial fibrillation Z79.01 - Long term (current) use of anticoagulants Bruening, Roxy C. ([id number]) Left calf ulcers and cellulitis. Venous insufficiency. Lymphedema. Plan Wound Cleansing: Wound #1 Left,Anterior Lower Leg: Clean wound with Normal Saline. May Shower, gently pat wound dry prior to applying new dressing. Wound #2 Left,Medial Lower Leg: Clean wound with Normal Saline. May Shower, gently pat wound dry prior to applying new dressing. Wound #3 Left,Medial Malleolus: Clean wound with Normal Saline. May Shower, gently pat wound dry prior to applying new dressing. Anesthetic: Wound #1 Left,Anterior Lower Leg: Topical Lidocaine 4% cream applied to wound bed prior to debridement Wound #2 Left,Medial Lower Leg: Topical Lidocaine 4% cream applied to wound bed prior to debridement Wound #3 Left,Medial Malleolus: Topical Lidocaine 4% cream applied to wound bed prior to debridement Primary Wound Dressing: Wound #1 Left,Anterior Lower Leg: Aquacel Ag Wound #2 Left,Medial Lower Leg: Aquacel Ag Wound #3 Left,Medial Malleolus: Aquacel Ag Secondary Dressing: Wound #1 Left,Anterior Lower Leg: ABD and Kerlix/Conform Wound #2 Left,Medial Lower Leg: ABD and Kerlix/Conform Wound #3 Left,Medial Malleolus: Boardered Foam Dressing Dressing Change Frequency: Wound #1 Left,Anterior Lower Leg: Change dressing every other day. Wound #2 Left,Medial Lower Leg: Change dressing every other day. Follow-up Appointments: Wound #1 Left,Anterior Lower Leg: Return Appointment in 1 week. Wound #2 Left,Medial Lower Leg: Return Appointment in 1 week. Edema Control: Wound #1 Left,Anterior Lower Leg: Elevate legs to the level of the heart and pump ankles as often as possible Tubigrip Struble, [place] C.  ([id number]) Wound #2 Left,Medial Lower Leg: Elevate legs to the level of the heart and pump ankles as  often as possible Tubigrip Additional Orders / Instructions: Wound #1 Left,Anterior Lower Leg: Increase protein intake. Wound #2 Left,Medial Lower Leg: Increase protein intake. Services and Therapies ordered were: Venous Studies -Unilateral - left, Arterial Studies- Unilateral - left Silver alginate. Tubigrip for edema control. Continue Augmentin. Once cellulitis is resolved we'll recommend compression bandages. Vascular surgery consultation for arterial and venous ultrasound evaluation. Electronic Signature(s) Signed: 07/18/2015 4:26:05 PM By: Madelaine Bhat MD Entered By: Madelaine Bhat on 07/18/2015 16:25:27 Donald Mouse ([id number]) -------------------------------------------------------------------------------- ROS/PFSH Details Patient Name: Donald Musca C. Date of Service: 07/18/2015 1:30 PM Medical Record Number: [id number] Patient Account Number: [id number] Date of Birth/Sex: May 10, 1929 (81 y.o. Male) Treating RN: Huel Coventry Primary Care Physician: Guerry Bruin Other Clinician: Referring Physician: Treating Physician/Extender: BURNS III, Regis Bill in Treatment: 0 Label Progress Note Print Version as History and Physical for this encounter Information Obtained From Patient Wound History Do you currently have one or more open woundso Yes How many open wounds do you currently haveo 2 Approximately how long have you had your woundso 2weeks How have you been treating your wound(s) until nowo vasaline gauze; wound cleaner Has your wound(s) ever healed and then re-openedo No Have you had any lab work done in the past montho No Have you tested positive for an antibiotic resistant organism (MRSA, No VRE)o Have you tested positive for osteomyelitis (bone infection)o No Have you had any tests for circulation on your legso Yes Who ordered the testo 10  plus years Have you had other problems associated with your woundso Swelling Constitutional Symptoms (General Health) Complaints and Symptoms: No Complaints or Symptoms Complaints and Symptoms: Negative for: Fatigue; Fever; Chills Medical History: Past Medical History Notes: A-fib; blood clots in legs; arthritis; leg, back and hip pain Cardiovascular Complaints and Symptoms: Positive for: LE edema Medical History: Positive for: Arrhythmia - pacemaker; Deep Vein Thrombosis - both legs 2003 Negative for: Angina; Congestive Heart Failure; Coronary Artery Disease; Hypertension; Hypotension; Myocardial Infarction; Peripheral Arterial Disease; Peripheral Venous Disease; Phlebitis; Vasculitis Endocrine JAVI, BOLLMAN C. ([id number]) Complaints and Symptoms: Negative for: Hepatitis; Thyroid disease; Polydypsia (Excessive Thirst) Medical History: Negative for: Type I Diabetes; Type II Diabetes Genitourinary Complaints and Symptoms: Negative for: Kidney failure/ Dialysis; Incontinence/dribbling Medical History: Negative for: End Stage Renal Disease Immunological Complaints and Symptoms: Negative for: Hives; Itching Medical History: Negative for: Lupus Erythematosus; Raynaudos; Scleroderma Integumentary (Skin) Complaints and Symptoms: Positive for: Wounds; Bleeding or bruising tendency Negative for: Breakdown; Swelling Medical History: Negative for: History of Burn; History of pressure wounds Musculoskeletal Complaints and Symptoms: Positive for: Muscle Pain - legs; Muscle Weakness - legs Medical History: Positive for: Osteoarthritis Negative for: Gout; Rheumatoid Arthritis; Osteomyelitis Eyes Complaints and Symptoms: No Complaints or Symptoms Medical History: Positive for: Cataracts - years ago Negative for: Glaucoma; Optic Neuritis Ear/Nose/Mouth/Throat RULON, ABDALLA ([id number]) Complaints and Symptoms: No Complaints or Symptoms Medical History: Negative for:  Chronic sinus problems/congestion; Middle ear problems Hematologic/Lymphatic Complaints and Symptoms: No Complaints or Symptoms Medical History: Negative for: Anemia; Hemophilia; Human Immunodeficiency Virus; Lymphedema; Sickle Cell Disease Respiratory Complaints and Symptoms: No Complaints or Symptoms Medical History: Negative for: Aspiration; Asthma; Chronic Obstructive Pulmonary Disease (COPD); Pneumothorax; Sleep Apnea; Tuberculosis Gastrointestinal Complaints and Symptoms: No Complaints or Symptoms Medical History: Negative for: Cirrhosis ; Colitis; Crohnos; Hepatitis A; Hepatitis B; Hepatitis C Neurologic Complaints and Symptoms: No Complaints or Symptoms Medical History: Negative for: Dementia; Neuropathy; Quadriplegia; Paraplegia; Seizure Disorder Oncologic Complaints and Symptoms: No Complaints or Symptoms Medical History: Negative for: Received  Chemotherapy; Received Radiation Psychiatric Complaints and Symptoms: No Complaints or Symptoms Dirr, Dejour C. ([id number]) Medical History: Negative for: Anorexia/bulimia; Confinement Anxiety HBO Extended History Items Eyes: Cataracts Family and Social History Cancer: Yes - Father; Diabetes: No; Heart Disease: No; Hypertension: No; Kidney Disease: No; Lung Disease: No; Seizures: No; Stroke: No; Thyroid Problems: No; Never smoker; Marital Status - Married; Alcohol Use: Never; Drug Use: No History; Caffeine Use: Daily; Living Will: No; Medical Power of Attorney: No Physician Affirmation I have reviewed and agree with the above information. Electronic Signature(s) Signed: 07/18/2015 4:26:05 PM By: Madelaine Bhat MD Signed: 07/18/2015 4:57:45 PM By: Elliot Gurney RN, [name], [name], [name] Entered By: Madelaine Bhat on 07/18/2015 16:25:10 Donald Mouse ([id number]) -------------------------------------------------------------------------------- SuperBill Details Patient Name: Donald Musca C. Date of Service:  07/18/2015 Medical Record Number: [id number] Patient Account Number: [id number] Date of Birth/Sex: 03/22/1929 (81 y.o. Male) Treating RN: Clover Mealy, RN, [name], Rita Primary Care Physician: Guerry Bruin Other Clinician: Referring Physician: Treating Physician/Extender: BURNS III, Regis Bill in Treatment: 0 Diagnosis Coding ICD-10 Codes Code Description [id number] Varicose veins of left lower extremity with both ulcer of calf and inflammation I89.0 Lymphedema, not elsewhere classified I87.2 Venous insufficiency (chronic) (peripheral) L03.116 Cellulitis of left lower limb I48.2 Chronic atrial fibrillation Z79.01 Long term (current) use of anticoagulants Facility Procedures CPT4 Code: [id number] Description: 99213 - WOUND CARE VISIT-LEV 3 EST PT Modifier: Quantity: 1 Physician Procedures CPT4: Description Modifier Quantity Code [id number] - WC PHYS LEVEL 4 - NEW PT 1 ICD-10 Description Diagnosis 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

## 2015-07-20 DIAGNOSIS — I482 Chronic atrial fibrillation: Secondary | ICD-10-CM | POA: Diagnosis not present

## 2015-07-20 DIAGNOSIS — I8290 Acute embolism and thrombosis of unspecified vein: Secondary | ICD-10-CM | POA: Diagnosis not present

## 2015-07-20 DIAGNOSIS — Z7901 Long term (current) use of anticoagulants: Secondary | ICD-10-CM | POA: Diagnosis not present

## 2015-07-20 DIAGNOSIS — Z6822 Body mass index (BMI) 22.0-22.9, adult: Secondary | ICD-10-CM | POA: Diagnosis not present

## 2015-07-25 ENCOUNTER — Encounter: Payer: Medicare Other | Admitting: Surgery

## 2015-07-25 DIAGNOSIS — L03116 Cellulitis of left lower limb: Secondary | ICD-10-CM | POA: Diagnosis not present

## 2015-07-25 DIAGNOSIS — L97321 Non-pressure chronic ulcer of left ankle limited to breakdown of skin: Secondary | ICD-10-CM | POA: Diagnosis not present

## 2015-07-25 DIAGNOSIS — I83222 Varicose veins of left lower extremity with both ulcer of calf and inflammation: Secondary | ICD-10-CM | POA: Diagnosis not present

## 2015-07-25 DIAGNOSIS — I890 Lymphedema, not elsewhere classified: Secondary | ICD-10-CM | POA: Diagnosis not present

## 2015-07-25 DIAGNOSIS — L97221 Non-pressure chronic ulcer of left calf limited to breakdown of skin: Secondary | ICD-10-CM | POA: Diagnosis not present

## 2015-07-25 DIAGNOSIS — I872 Venous insufficiency (chronic) (peripheral): Secondary | ICD-10-CM | POA: Diagnosis not present

## 2015-07-26 NOTE — Progress Notes (Signed)
[name], Thatcher C. ([id number]) Visit Report for 07/25/2015 Chief Complaint Document Details Patient Name: [name], [name] Date of Service: 07/25/2015 3:30 PM Medical Record Number: [id number] Patient Account Number: [id number] Date of Birth/Sex: 11/26/1928 81(81 y.o. Male) Treating RN: Donald Sitesorthy, Joanna Primary Care Physician: Guerry BruinISOVEC, RICHARD Other Clinician: Referring Physician: Guerry BruinISOVEC, RICHARD Treating Physician/Extender: BURNS III, Regis BillWALTER Weeks in Treatment: 1 Information Obtained from: Patient Chief Complaint Left calf ulcerations. Electronic Signature(s) Signed: 07/25/2015 4:30:30 PM By: Madelaine BhatBurns, III, Bayani Renteria MD Entered By: Madelaine BhatBurns, III, Treena Cosman on 07/25/2015 16:15:35 [name], [name] ([id number]) -------------------------------------------------------------------------------- HPI Details Patient Name: [name], [name] Date of Service: 07/25/2015 3:30 PM Medical Record Number: [id number] Patient Account Number: [id number] Date of Birth/Sex: 05/12/1929 11(81 y.o. Male) Treating RN: Donald Sitesorthy, Joanna Primary Care Physician: Guerry BruinISOVEC, RICHARD Other Clinician: Referring Physician: Guerry BruinISOVEC, RICHARD Treating Physician/Extender: BURNS III, Regis BillWALTER Weeks in Treatment: 1 History of Present Illness HPI Description: Pleasant 81 year old with history of chronic venous insufficiency, remote history of DVT, atrial fibrillation (on Coumadin). No history of diabetes. He says that he developed worsening left lower extremity swelling, blisters, and subsequent ulcerations in late October 2016. He was started on Augmentin for left lower extremity cellulitis, which has improved. Performing dressing changes with silver alginate and using a Tubigrip for edema control. He denies any significant pain. No claudication or rest pain. No ABI obtained today. Ambulating per his baseline with a walker. No fever or chills. Moderate drainage. Electronic Signature(s) Signed: 07/25/2015 4:30:30 PM By: Madelaine BhatBurns, III,  Liann Spaeth MD Entered By: Madelaine BhatBurns, III, Enrika Aguado on 07/25/2015 16:16:27 [name], Taevion C. ([id number]) -------------------------------------------------------------------------------- Physical Exam Details Patient Name: Donald MuscaWILLIFORD, [name] Date of Service: 07/25/2015 3:30 PM Medical Record Number: [id number] Patient Account Number: [id number] Date of Birth/Sex: 05/19/1929 3(81 y.o. Male) Treating RN: Donald Sitesorthy, Joanna Primary Care Physician: Guerry BruinISOVEC, RICHARD Other Clinician: Referring Physician: Guerry BruinISOVEC, RICHARD Treating Physician/Extender: BURNS III, Kenedy Haisley Weeks in Treatment: 1 Constitutional . Pulse regular. Respirations normal and unlabored. Afebrile. Marland Kitchen. Respiratory WNL. No retractions.. Cardiovascular . Integumentary (Hair, Skin) .Marland Kitchen. Neurological Sensation normal to touch, pin,and vibration. Psychiatric Judgement and insight Intact.. Oriented times 3.. No evidence of depression, anxiety, or agitation.. Notes Multiple left calf ulcerations. Partial and full-thickness. Biofilm wiped free with gauze. No surgical debridement required. Minimal residual cellulitis. 1+ pitting edema. No palpable pedal pulses. Dopplerable DP and PT. Electronic Signature(s) Signed: 07/25/2015 4:30:30 PM By: Madelaine BhatBurns, III, Aneesa Romey MD Entered By: Madelaine BhatBurns, III, Itzayana Pardy on 07/25/2015 16:17:38 [name], [name] ([id number]) -------------------------------------------------------------------------------- Physician Orders Details Patient Name: [name], [name] Date of Service: 07/25/2015 3:30 PM Medical Record Number: [id number] Patient Account Number: [id number] Date of Birth/Sex: 10/12/1928 [age] y.o. Male) Treating RN: Donald Sitesorthy, Joanna Primary Care Physician: Guerry BruinISOVEC, RICHARD Other Clinician: Referring Physician: Guerry BruinISOVEC, RICHARD Treating Physician/Extender: BURNS III, Regis BillWALTER Weeks in Treatment: 1 Verbal / Phone Orders: Yes Clinician: Curtis Sitesorthy, Joanna Read Back and Verified: Yes Diagnosis Coding Wound  Cleansing Wound #1 Left,Anterior Lower Leg o Clean wound with Normal Saline. o May Shower, gently pat wound dry prior to applying new dressing. Wound #2 Left,Medial Lower Leg o Clean wound with Normal Saline. o May Shower, gently pat wound dry prior to applying new dressing. Wound #3 Left,Medial Malleolus o Clean wound with Normal Saline. o May Shower, gently pat wound dry prior to applying new dressing. Wound #4 Left,Proximal,Anterior Lower Leg o Clean wound with Normal Saline. o May Shower, gently pat wound dry prior to applying new dressing. Anesthetic Wound #1 Left,Anterior Lower Leg o Topical Lidocaine 4% cream applied to wound bed prior to debridement Wound #  2 Left,Medial Lower Leg o Topical Lidocaine 4% cream applied to wound bed prior to debridement Wound #3 Left,Medial Malleolus o Topical Lidocaine 4% cream applied to wound bed prior to debridement Wound #4 Left,Proximal,Anterior Lower Leg o Topical Lidocaine 4% cream applied to wound bed prior to debridement Primary Wound Dressing Wound #1 Left,Anterior Lower Leg o Aquacel Ag Wound #2 Left,Medial Lower Leg o Aquacel Ag Leichter, [name] ([id number]) Wound #3 Left,Medial Malleolus o Aquacel Ag Wound #4 Left,Proximal,Anterior Lower Leg o Aquacel Ag Secondary Dressing Wound #1 Left,Anterior Lower Leg o ABD and Kerlix/Conform Wound #2 Left,Medial Lower Leg o ABD and Kerlix/Conform Wound #4 Left,Proximal,Anterior Lower Leg o ABD and Kerlix/Conform Wound #3 Left,Medial Malleolus o Boardered Foam Dressing Dressing Change Frequency Wound #1 Left,Anterior Lower Leg o Change dressing every other day. Wound #2 Left,Medial Lower Leg o Change dressing every other day. Wound #3 Left,Medial Malleolus o Change dressing every other day. Wound #4 Left,Proximal,Anterior Lower Leg o Change dressing every other day. Follow-up Appointments Wound #1 Left,Anterior Lower Leg o  Return Appointment in 1 week. Wound #2 Left,Medial Lower Leg o Return Appointment in 1 week. Wound #3 Left,Medial Malleolus o Return Appointment in 1 week. Wound #4 Left,Proximal,Anterior Lower Leg o Return Appointment in 1 week. Edema Control Wound #1 Left,Anterior Lower Leg [name], [name] ([id number]) o Elevate legs to the level of the heart and pump ankles as often as possible o Tubigrip Wound #2 Left,Medial Lower Leg o Elevate legs to the level of the heart and pump ankles as often as possible o Tubigrip Wound #3 Left,Medial Malleolus o Elevate legs to the level of the heart and pump ankles as often as possible o Tubigrip Wound #4 Left,Proximal,Anterior Lower Leg o Elevate legs to the level of the heart and pump ankles as often as possible o Tubigrip Additional Orders / Instructions Wound #1 Left,Anterior Lower Leg o Increase protein intake. Wound #2 Left,Medial Lower Leg o Increase protein intake. Wound #3 Left,Medial Malleolus o Increase protein intake. Wound #4 Left,Proximal,Anterior Lower Leg o Increase protein intake. Electronic Signature(s) Signed: 07/25/2015 4:30:30 PM By: Madelaine Bhat MD Signed: 07/25/2015 5:05:32 PM By: Donald Sites Entered By: Donald Sites on 07/25/2015 16:12:21 Donald Mouse ([id number]) -------------------------------------------------------------------------------- Problem List Details Patient Name: CORINTHIAN, MIZRAHI C. Date of Service: 07/25/2015 3:30 PM Medical Record Number: [id number] Patient Account Number: [id number] Date of Birth/Sex: [birth year] (81 y.o. Male) Treating RN: Donald Sites Primary Care Physician: Guerry Bruin Other Clinician: Referring Physician: Guerry Bruin Treating Physician/Extender: BURNS III, Regis Bill in Treatment: 1 Active Problems ICD-10 Encounter Code Description Active Date Diagnosis I83.222 Varicose veins of left lower extremity with both  ulcer of 07/18/2015 Yes calf and inflammation I89.0 Lymphedema, not elsewhere classified 07/18/2015 Yes I87.2 Venous insufficiency (chronic) (peripheral) 07/18/2015 Yes L03.116 Cellulitis of left lower limb 07/18/2015 Yes I48.2 Chronic atrial fibrillation 07/18/2015 Yes Z79.01 Long term (current) use of anticoagulants 07/18/2015 Yes Inactive Problems Resolved Problems Electronic Signature(s) Signed: 07/25/2015 4:30:30 PM By: Madelaine Bhat MD Entered By: Madelaine Bhat on 07/25/2015 16:15:17 Sunde, [name] ([id number]) -------------------------------------------------------------------------------- Progress Note Details Patient Name: Donald Musca C. Date of Service: 07/25/2015 3:30 PM Medical Record Number: [id number] Patient Account Number: [id number] Date of Birth/Sex: Jul 13, 1929 (81 y.o. Male) Treating RN: Donald Sites Primary Care Physician: Guerry Bruin Other Clinician: Referring Physician: Guerry Bruin Treating Physician/Extender: BURNS III, Regis Bill in Treatment: 1 Subjective Chief Complaint Information obtained from Patient Left calf ulcerations. History of Present Illness (HPI) Pleasant 81 year old with history of chronic  venous insufficiency, remote history of DVT, atrial fibrillation (on Coumadin). No history of diabetes. He says that he developed worsening left lower extremity swelling, blisters, and subsequent ulcerations in late October 2016. He was started on Augmentin for left lower extremity cellulitis, which has improved. Performing dressing changes with silver alginate and using a Tubigrip for edema control. He denies any significant pain. No claudication or rest pain. No ABI obtained today. Ambulating per his baseline with a walker. No fever or chills. Moderate drainage. Objective Constitutional Pulse regular. Respirations normal and unlabored. Afebrile. Vitals Time Taken: 3:44 PM, Height: 70 in, Weight: 144 lbs, BMI: 20.7,  Temperature: 98.1 F, Pulse: 63 bpm, Respiratory Rate: 18 breaths/min, Blood Pressure: 80/42 mmHg. Respiratory WNL. No retractions.. Neurological Sensation normal to touch, pin,and vibration. Psychiatric Judgement and insight Intact.. Oriented times 3.. No evidence of depression, anxiety, or agitation.Jeanie Sewer, [name] ([id number]) General Notes: Multiple left calf ulcerations. Partial and full-thickness. Biofilm wiped free with gauze. No surgical debridement required. Minimal residual cellulitis. 1+ pitting edema. No palpable pedal pulses. Dopplerable DP and PT. Integumentary (Hair, Skin) Wound #1 status is Open. Original cause of wound was Blister. The wound is located on the Left,Anterior Lower Leg. The wound measures 0.6cm length x 0.8cm width x 0.1cm depth; 0.377cm^2 area and 0.038cm^3 volume. The wound is limited to skin breakdown. There is no tunneling or undermining noted. There is a small amount of serous drainage noted. The wound margin is indistinct and nonvisible. There is large (67-100%) red granulation within the wound bed. There is no necrotic tissue within the wound bed. The periwound skin appearance exhibited: Moist. The periwound skin appearance did not exhibit: Callus, Crepitus, Excoriation, Fluctuance, Friable, Induration, Localized Edema, Rash, Scarring, Dry/Scaly, Maceration, Atrophie Blanche, Cyanosis, Ecchymosis, Hemosiderin Staining, Mottled, Pallor, Rubor, Erythema. Wound #2 status is Open. Original cause of wound was Blister. The wound is located on the Left,Medial Lower Leg. The wound measures 1.7cm length x 2.5cm width x 0.1cm depth; 3.338cm^2 area and 0.334cm^3 volume. The wound is limited to skin breakdown. There is no tunneling or undermining noted. There is a large amount of serous drainage noted. The wound margin is flat and intact. There is small (1-33%) pink granulation within the wound bed. There is a large (67-100%) amount of necrotic tissue  within the wound bed including Adherent Slough. The periwound skin appearance exhibited: Moist. The periwound skin appearance did not exhibit: Callus, Crepitus, Excoriation, Fluctuance, Friable, Induration, Localized Edema, Rash, Scarring, Dry/Scaly, Maceration, Atrophie Blanche, Cyanosis, Ecchymosis, Hemosiderin Staining, Mottled, Pallor, Rubor, Erythema. The periwound has tenderness on palpation. Wound #3 status is Open. Original cause of wound was Gradually Appeared. The wound is located on the Left,Medial Malleolus. The wound measures 0.6cm length x 0.5cm width x 0.1cm depth; 0.236cm^2 area and 0.024cm^3 volume. The wound is limited to skin breakdown. There is no tunneling or undermining noted. There is a large amount of serous drainage noted. The wound margin is flat and intact. There is large (67-100%) pink granulation within the wound bed. There is no necrotic tissue within the wound bed. The periwound skin appearance exhibited: Moist. The periwound skin appearance did not exhibit: Callus, Crepitus, Excoriation, Fluctuance, Friable, Induration, Localized Edema, Rash, Scarring, Dry/Scaly, Maceration, Atrophie Blanche, Cyanosis, Ecchymosis, Hemosiderin Staining, Mottled, Pallor, Rubor, Erythema. Wound #4 status is Open. Original cause of wound was Trauma. The wound is located on the Left,Proximal,Anterior Lower Leg. The wound measures 1.6cm length x 1.3cm width x 0.1cm depth; 1.634cm^2 area and 0.163cm^3 volume. The wound is  limited to skin breakdown. There is no tunneling or undermining noted. There is a small amount of serous drainage noted. The wound margin is flat and intact. There is large (67-100%) red granulation within the wound bed. There is no necrotic tissue within the wound bed. The periwound skin appearance exhibited: Moist. The periwound skin appearance did not exhibit: Callus, Crepitus, Excoriation, Fluctuance, Friable, Induration, Localized Edema, Rash, Scarring,  Dry/Scaly, Maceration, Atrophie Blanche, Cyanosis, Ecchymosis, Hemosiderin Staining, Mottled, Pallor, Rubor, Erythema. 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 Resolving cellulitis. Plan Wound Cleansing: Wound #1 Left,Anterior Lower Leg: Clean wound with Normal Saline. May Shower, gently pat wound dry prior to applying new dressing. Wound #2 Left,Medial Lower Leg: Clean wound with Normal Saline. May Shower, gently pat wound dry prior to applying new dressing. Wound #3 Left,Medial Malleolus: Clean wound with Normal Saline. May Shower, gently pat wound dry prior to applying new dressing. Wound #4 Left,Proximal,Anterior Lower Leg: Clean wound with Normal Saline. May Shower, gently pat wound dry prior to applying new dressing. Anesthetic: Wound #1 Left,Anterior Lower Leg: Topical Lidocaine 4% cream applied to wound bed prior to debridement Wound #2 Left,Medial Lower Leg: Topical Lidocaine 4% cream applied to wound bed prior to debridement Wound #3 Left,Medial Malleolus: Topical Lidocaine 4% cream applied to wound bed prior to debridement Wound #4 Left,Proximal,Anterior Lower Leg: Topical Lidocaine 4% cream applied to wound bed prior to debridement Primary Wound Dressing: Wound #1 Left,Anterior Lower Leg: Aquacel Ag Wound #2 Left,Medial Lower Leg: Aquacel Ag Wound #3 Left,Medial Malleolus: Aquacel Ag Kozma, Miller CMarland Kitchen ([id number]) Wound #4 Left,Proximal,Anterior Lower Leg: Aquacel Ag Secondary Dressing: Wound #1 Left,Anterior Lower Leg: ABD and Kerlix/Conform Wound #2 Left,Medial Lower Leg: ABD and  Kerlix/Conform Wound #4 Left,Proximal,Anterior Lower Leg: ABD and Kerlix/Conform Wound #3 Left,Medial Malleolus: Boardered Foam Dressing Dressing Change Frequency: Wound #1 Left,Anterior Lower Leg: Change dressing every other day. Wound #2 Left,Medial Lower Leg: Change dressing every other day. Wound #3 Left,Medial Malleolus: Change dressing every other day. Wound #4 Left,Proximal,Anterior Lower Leg: Change dressing every other day. Follow-up Appointments: Wound #1 Left,Anterior Lower Leg: Return Appointment in 1 week. Wound #2 Left,Medial Lower Leg: Return Appointment in 1 week. Wound #3 Left,Medial Malleolus: Return Appointment in 1 week. Wound #4 Left,Proximal,Anterior Lower Leg: Return Appointment in 1 week. Edema Control: Wound #1 Left,Anterior Lower Leg: Elevate legs to the level of the heart and pump ankles as often as possible Tubigrip Wound #2 Left,Medial Lower Leg: Elevate legs to the level of the heart and pump ankles as often as possible Tubigrip Wound #3 Left,Medial Malleolus: Elevate legs to the level of the heart and pump ankles as often as possible Tubigrip Wound #4 Left,Proximal,Anterior Lower Leg: Elevate legs to the level of the heart and pump ankles as often as possible Tubigrip Additional Orders / Instructions: Wound #1 Left,Anterior Lower Leg: Increase protein intake. Wound #2 Left,Medial Lower Leg: Increase protein intake. Wound #3 Left,Medial Malleolus: Increase protein intake. Wound #4 Left,Proximal,Anterior Lower Leg: Increase protein intake. Strothman, Cambren CMarland Kitchen ([id number]) Continue silver alginate and Tubigrip for edema control. Complete Augmentin. Arterial and venous ultrasound pending. Electronic Signature(s) Signed: 07/25/2015 4:30:30 PM By: Madelaine Bhat MD Entered By: Madelaine Bhat on 07/25/2015  16:18:14 ALWALEED, OBESO  ([id number]) -------------------------------------------------------------------------------- SuperBill Details Patient Name: NIL, XIONG C. Date of Service: 07/25/2015 Medical Record Number: [id number] Patient Account Number: [id number] Date of Birth/Sex: [birth year] (81 y.o. 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 I89.0 Lymphedema, not elsewhere classified I87.2 Venous insufficiency (chronic) (peripheral) L03.116 Cellulitis of left lower limb I48.2 Chronic atrial fibrillation Z79.01 Long term (current) use of anticoagulants Facility Procedures CPT4 Code: [id number] Description: [phone] - WOUND CARE VISIT-LEV 5 EST PT Modifier: Quantity: 1 Physician Procedures CPT4: Description Modifier Quantity Code [id number] - WC PHYS LEVEL 3 - EST PT 1 ICD-10 Description Diagnosis 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

## 2015-07-26 NOTE — Progress Notes (Signed)
[name] ([id number]) Visit Report for 07/25/2015 Arrival Information Details Patient Name: [name], [name]. Date of Service: 07/25/2015 3:30 PM Medical Record Number: [id number] Patient Account Number: [id number] Date of Birth/Sex: 1928/12/08 (81 y.o. Male) Treating RN: Curtis Sites Primary Care Physician: Guerry Bruin Other Clinician: Referring Physician: Guerry Bruin Treating Physician/Extender: BURNS III, Regis Bill in Treatment: 1 Visit Information History Since Last Visit Added or deleted any medications: No Patient Arrived: Wheel Chair Any new allergies or adverse reactions: No Arrival Time: 15:42 Had a fall or experienced change in No Accompanied By: spouse activities of daily living that may affect Transfer Assistance: Manual risk of falls: Patient Identification Verified: Yes Signs or symptoms of abuse/neglect since last No Secondary Verification Process Yes visito Completed: Hospitalized since last visit: No Patient Has Alerts: Yes Pain Present Now: No Patient Alerts: Patient on Blood Thinner warfarin NOT Diabetic Electronic Signature(s) Signed: 07/25/2015 5:05:32 PM By: Curtis Sites Entered By: Curtis Sites on 07/25/2015 15:43:12 Koors, [name] ([id number]) -------------------------------------------------------------------------------- Clinic Level of Care Assessment Details Patient Name: [name]. Date of Service: 07/25/2015 3:30 PM Medical Record Number: [id number] Patient Account Number: [id number] Date of Birth/Sex: [birth year] (82 y.o. Male) Treating RN: Curtis Sites Primary Care Physician: Guerry Bruin Other Clinician: Referring Physician: Guerry Bruin Treating Physician/Extender: BURNS III, Regis Bill in Treatment: 1 Clinic Level of Care Assessment Items TOOL 4 Quantity Score []  - Use when only an EandM is performed on FOLLOW-UP visit 0 ASSESSMENTS - Nursing Assessment / Reassessment X -  Reassessment of Co-morbidities (includes updates in patient status) 1 10 X - Reassessment of Adherence to Treatment Plan 1 5 ASSESSMENTS - Wound and Skin Assessment / Reassessment []  - Simple Wound Assessment / Reassessment - one wound 0 X - Complex Wound Assessment / Reassessment - multiple wounds 4 5 []  - Dermatologic / Skin Assessment (not related to wound area) 0 ASSESSMENTS - Focused Assessment X - Circumferential Edema Measurements - multi extremities 1 5 []  - Nutritional Assessment / Counseling / Intervention 0 X - Lower Extremity Assessment (monofilament, tuning fork, pulses) 1 5 []  - Peripheral Arterial Disease Assessment (using hand held doppler) 0 ASSESSMENTS - Ostomy and/or Continence Assessment and Care []  - Incontinence Assessment and Management 0 []  - Ostomy Care Assessment and Management (repouching, etc.) 0 PROCESS - Coordination of Care X - Simple Patient / Family Education for ongoing care 1 15 []  - Complex (extensive) Patient / Family Education for ongoing care 0 []  - Staff obtains Chiropractor, Records, Test Results / Process Orders 0 []  - Staff telephones HHA, Nursing Homes / Clarify orders / etc 0 []  - Routine Transfer to another Facility (non-emergent condition) 0 Borntreger, Jacobey C. ([id number]) []  - Routine Hospital Admission (non-emergent condition) 0 []  - New Admissions / Manufacturing engineer / Ordering NPWT, Apligraf, etc. 0 []  - Emergency Hospital Admission (emergent condition) 0 X - Simple Discharge Coordination 1 10 []  - Complex (extensive) Discharge Coordination 0 PROCESS - Special Needs []  - Pediatric / Minor Patient Management 0 []  - Isolation Patient Management 0 []  - Hearing / Language / Visual special needs 0 []  - Assessment of Community assistance (transportation, D/C planning, etc.) 0 []  - Additional assistance / Altered mentation 0 []  - Support Surface(s) Assessment (bed, cushion, seat, etc.) 0 INTERVENTIONS - Wound Cleansing / Measurement []   - Simple Wound Cleansing - one wound 0 X - Complex Wound Cleansing - multiple wounds 4 5 X - Wound Imaging (photographs - any number of wounds) 1 5 []  -  Wound Tracing (instead of photographs) 0 []  - Simple Wound Measurement - one wound 0 X - Complex Wound Measurement - multiple wounds 4 5 INTERVENTIONS - Wound Dressings X - Small Wound Dressing one or multiple wounds 4 10 []  - Medium Wound Dressing one or multiple wounds 0 []  - Large Wound Dressing one or multiple wounds 0 []  - Application of Medications - topical 0 []  - Application of Medications - injection 0 INTERVENTIONS - Miscellaneous []  - External ear exam 0 Brucato, Dino C. ([id number]) []  - Specimen Collection (cultures, biopsies, blood, body fluids, etc.) 0 []  - Specimen(s) / Culture(s) sent or taken to Lab for analysis 0 []  - Patient Transfer (multiple staff / Michiel Sites Lift / Similar devices) 0 []  - Simple Staple / Suture removal (25 or less) 0 []  - Complex Staple / Suture removal (26 or more) 0 []  - Hypo / Hyperglycemic Management (close monitor of Blood Glucose) 0 []  - Ankle / Brachial Index (ABI) - do not check if billed separately 0 X - Vital Signs 1 5 Has the patient been seen at the hospital within the last three years: Yes Total Score: 160 Level Of Care: New/Established - Level 5 Electronic Signature(s) Signed: 07/25/2015 5:05:32 PM By: Curtis Sites Entered By: Curtis Sites on 07/25/2015 16:13:06 [name] ([id number]) -------------------------------------------------------------------------------- Encounter Discharge Information Details Patient Name: Donald Musca C. Date of Service: 07/25/2015 3:30 PM Medical Record Number: [id number] Patient Account Number: [id number] Date of Birth/Sex: 02-01-1929 (81 y.o. Male) Treating RN: Curtis Sites Primary Care Physician: Guerry Bruin Other Clinician: Referring Physician: Guerry Bruin Treating Physician/Extender: BURNS III, Regis Bill in  Treatment: 1 Encounter Discharge Information Items Discharge Pain Level: 0 Discharge Condition: Stable Ambulatory Status: Wheelchair Discharge Destination: Home Transportation: Private Auto Accompanied By: spouse Schedule Follow-up Appointment: Yes Medication Reconciliation completed and provided to Patient/Care No Namari Breton: Provided on Clinical Summary of Care: 07/25/2015 Form Type Recipient Paper Patient RW Electronic Signature(s) Signed: 07/25/2015 5:05:32 PM By: Curtis Sites Previous Signature: 07/25/2015 4:29:04 PM Version By: Gwenlyn Perking Entered By: Curtis Sites on 07/25/2015 16:29:31 [name] ([id number]) -------------------------------------------------------------------------------- Lower Extremity Assessment Details Patient Name: Donald Musca C. Date of Service: 07/25/2015 3:30 PM Medical Record Number: [id number] Patient Account Number: [id number] Date of Birth/Sex: 1929-01-04 (81 y.o. Male) Treating RN: Curtis Sites Primary Care Physician: Guerry Bruin Other Clinician: Referring Physician: Guerry Bruin Treating Physician/Extender: BURNS III, Regis Bill in Treatment: 1 Edema Assessment Assessed: [Left: No] [Right: No] Edema: [Left: Ye] [Right: s] Calf Left: Right: Point of Measurement: 33 cm From Medial Instep 32.5 cm cm Ankle Left: Right: Point of Measurement: 12 cm From Medial Instep 23.3 cm cm Vascular Assessment Pulses: Posterior Tibial Dorsalis Pedis Palpable: [Left:Yes] Extremity colors, hair growth, and conditions: Extremity Color: [Left:Hyperpigmented] Hair Growth on Extremity: [Left:No] Temperature of Extremity: [Left:Warm] Capillary Refill: [Left:< 3 seconds] Toe Nail Assessment Left: Right: Thick: Yes Discolored: Yes Deformed: No Improper Length and Hygiene: No Electronic Signature(s) Signed: 07/25/2015 5:05:32 PM By: Curtis Sites Entered By: Curtis Sites on 07/25/2015 15:53:25 Bunner, [name]  ([id number]) -------------------------------------------------------------------------------- Multi Wound Chart Details Patient Name: Donald Musca C. Date of Service: 07/25/2015 3:30 PM Medical Record Number: [id number] Patient Account Number: [id number] Date of Birth/Sex: 1929/06/14 (81 y.o. Male) Treating RN: Curtis Sites Primary Care Physician: Guerry Bruin Other Clinician: Referring Physician: Guerry Bruin Treating Physician/Extender: BURNS III, WALTER Weeks in Treatment: 1 Vital Signs Height(in): 70 Pulse(bpm): 63 Weight(lbs): 144 Blood Pressure 80/42 (mmHg): Body Mass Index(BMI): 21 Temperature(F): 98.1 Respiratory Rate 18 (breaths/min):  Photos: [1:No Photos] [2:No Photos] [3:No Photos] Wound Location: [1:Left Lower Leg - Anterior] [2:Left Lower Leg - Medial] [3:Left Malleolus - Medial] Wounding Event: [1:Blister] [2:Blister] [3:Gradually Appeared] Primary Etiology: [1:Venous Leg Ulcer] [2:Venous Leg Ulcer] [3:Venous Leg Ulcer] Comorbid History: [1:Cataracts, Arrhythmia, Deep Vein Thrombosis, Osteoarthritis] [2:Cataracts, Arrhythmia, Deep Vein Thrombosis, Osteoarthritis] [3:Cataracts, Arrhythmia, Deep Vein Thrombosis, Osteoarthritis] Date Acquired: [1:07/04/2015] [2:07/04/2015] [3:05/16/2015] Weeks of Treatment: [1:1] [2:1] [3:1] Wound Status: [1:Open] [2:Open] [3:Open] Measurements L x W x D 0.6x0.8x0.1 [2:1.7x2.5x0.1] [3:0.6x0.5x0.1] (cm) Area (cm) : [1:0.377] [2:3.338] [3:0.236] Volume (cm) : [1:0.038] [2:0.334] [3:0.024] % Reduction in Area: [1:98.40%] [2:11.50%] [3:-151.10%] % Reduction in Volume: 98.40% [2:11.40%] [3:-166.70%] Classification: [1:Full Thickness Without Exposed Support Structures] [2:Full Thickness Without Exposed Support Structures] [3:Partial Thickness] Exudate Amount: [1:Small] [2:Large] [3:Large] Exudate Type: [1:Serous] [2:Serous] [3:Serous] Exudate Color: [1:amber] [2:amber] [3:amber] Wound Margin: [1:Indistinct, nonvisible]  [2:Flat and Intact] [3:Flat and Intact] Granulation Amount: [1:Large (67-100%)] [2:Small (1-33%)] [3:Large (67-100%)] Granulation Quality: [1:Red] [2:Pink] [3:Pink] Necrotic Amount: [1:None Present (0%)] [2:Large (67-100%)] [3:None Present (0%)] Exposed Structures: [1:Fascia: No Fat: No Tendon: No Muscle: No] [2:Fascia: No Fat: No Tendon: No Muscle: No] [3:Fascia: No Fat: No Tendon: No Muscle: No] Joint: No Joint: No Joint: No Bone: No Bone: No Bone: No Limited to Skin Limited to Skin Limited to Skin Breakdown Breakdown Breakdown Epithelialization: None None Small (1-33%) Periwound Skin Texture: Edema: No Edema: No Edema: No Excoriation: No Excoriation: No Excoriation: No Induration: No Induration: No Induration: No Callus: No Callus: No Callus: No Crepitus: No Crepitus: No Crepitus: No Fluctuance: No Fluctuance: No Fluctuance: No Friable: No Friable: No Friable: No Rash: No Rash: No Rash: No Scarring: No Scarring: No Scarring: No Periwound Skin Moist: Yes Moist: Yes Moist: Yes Moisture: Maceration: No Maceration: No Maceration: No Dry/Scaly: No Dry/Scaly: No Dry/Scaly: No Periwound Skin Color: Atrophie Blanche: No Atrophie Blanche: No Atrophie Blanche: No Cyanosis: No Cyanosis: No Cyanosis: No Ecchymosis: No Ecchymosis: No Ecchymosis: No Erythema: No Erythema: No Erythema: No Hemosiderin Staining: No Hemosiderin Staining: No Hemosiderin Staining: No Mottled: No Mottled: No Mottled: No Pallor: No Pallor: No Pallor: No Rubor: No Rubor: No Rubor: No Tenderness on No Yes No Palpation: Wound Preparation: Ulcer Cleansing: Ulcer Cleansing: Ulcer Cleansing: Rinsed/Irrigated with Rinsed/Irrigated with Rinsed/Irrigated with Saline Saline Saline Topical Anesthetic Topical Anesthetic Topical Anesthetic Applied: None Applied: Other: lidociane Applied: None 4% Wound Number: 4 N/A N/A Photos: No Photos N/A N/A Wound Location: Left Lower Leg -  Anterior, N/A N/A Proximal Wounding Event: Trauma N/A N/A Primary Etiology: Skin Tear N/A N/A Comorbid History: Cataracts, Arrhythmia, N/A N/A Deep Vein Thrombosis, Osteoarthritis Date Acquired: 07/23/2015 N/A N/A Weeks of Treatment: 0 N/A N/A Wound Status: Open N/A N/A Measurements L x W x D 1.6x1.3x0.1 N/A N/A (cm) Area (cm) : 1.634 N/A N/A Covalt, Brycin C. ([id number]) Volume (cm) : 0.163 N/A N/A % Reduction in Area: N/A N/A N/A % Reduction in Volume: N/A N/A N/A Classification: Partial Thickness N/A N/A Exudate Amount: Small N/A N/A Exudate Type: Serous N/A N/A Exudate Color: amber N/A N/A Wound Margin: Flat and Intact N/A N/A Granulation Amount: Large (67-100%) N/A N/A Granulation Quality: Red N/A N/A Necrotic Amount: None Present (0%) N/A N/A Exposed Structures: Fascia: No N/A N/A Fat: No Tendon: No Muscle: No Joint: No Bone: No Limited to Skin Breakdown Epithelialization: None N/A N/A Periwound Skin Texture: Edema: No N/A N/A Excoriation: No Induration: No Callus: No Crepitus: No Fluctuance: No Friable: No Rash: No Scarring: No Periwound Skin Moist: Yes N/A N/A Moisture:  Maceration: No Dry/Scaly: No Periwound Skin Color: Atrophie Blanche: No N/A N/A Cyanosis: No Ecchymosis: No Erythema: No Hemosiderin Staining: No Mottled: No Pallor: No Rubor: No Tenderness on No N/A N/A Palpation: Wound Preparation: Ulcer Cleansing: N/A N/A Rinsed/Irrigated with Saline Topical Anesthetic Applied: None Treatment Notes LARRY, ALCOCK ([id number]) Electronic Signature(s) Signed: 07/25/2015 5:05:32 PM By: Curtis Sites Entered By: Curtis Sites on 07/25/2015 16:01:21 [name] ([id number]) -------------------------------------------------------------------------------- Multi-Disciplinary Care Plan Details Patient Name: [name]. Date of Service: 07/25/2015 3:30 PM Medical Record Number: [id number] Patient Account Number:  [id number] Date of Birth/Sex: 1929/06/09 (81 y.o. Male) Treating RN: Curtis Sites Primary Care Physician: Guerry Bruin Other Clinician: Referring Physician: Guerry Bruin Treating Physician/Extender: BURNS III, Regis Bill in Treatment: 1 Active Inactive Orientation to the Wound Care Program Nursing Diagnoses: Knowledge deficit related to the wound healing center program Goals: Patient/caregiver will verbalize understanding of the Wound Healing Center Program Date Initiated: 07/18/2015 Goal Status: Active Interventions: Provide education on orientation to the wound center Notes: Venous Leg Ulcer Nursing Diagnoses: Actual venous Insuffiency (use after diagnosis is confirmed) Goals: Non-invasive venous studies are completed as ordered Date Initiated: 07/18/2015 Goal Status: Active Interventions: Assess peripheral edema status every visit. Treatment Activities: Non-invasive vascular studies : 07/25/2015 Notes: Wound/Skin Impairment Nursing Diagnoses: Knowledge deficit related to ulceration/compromised skin integrity Dimock, Kalan C. ([id number]) Goals: Ulcer/skin breakdown will heal within 14 weeks Date Initiated: 07/18/2015 Goal Status: Active Interventions: Assess patient/caregiver ability to obtain necessary supplies Notes: Electronic Signature(s) Signed: 07/25/2015 5:05:32 PM By: Curtis Sites Entered By: Curtis Sites on 07/25/2015 15:59:29 [name] ([id number]) -------------------------------------------------------------------------------- Patient/Caregiver Education Details Patient Name: [name]. Date of Service: 07/25/2015 3:30 PM Medical Record Number: [id number] Patient Account Number: [id number] Date of Birth/Gender: [birth year] (81 y.o. Male) Treating RN: Curtis Sites Primary Care Physician: Guerry Bruin Other Clinician: Referring Physician: Guerry Bruin Treating Physician/Extender: BURNS III, Regis Bill in  Treatment: 1 Education Assessment Education Provided To: Patient and Caregiver Education Topics Provided Wound/Skin Impairment: Handouts: Other: wound care as ordered Methods: Demonstration, Explain/Verbal Responses: State content correctly Electronic Signature(s) Signed: 07/25/2015 5:05:32 PM By: Curtis Sites Entered By: Curtis Sites on 07/25/2015 16:29:46 [name] ([id number]) -------------------------------------------------------------------------------- Wound Assessment Details Patient Name: Donald Musca C. Date of Service: 07/25/2015 3:30 PM Medical Record Number: [id number] Patient Account Number: [id number] Date of Birth/Sex: 1928-11-03 (81 y.o. Male) Treating RN: Curtis Sites Primary Care Physician: Guerry Bruin Other Clinician: Referring Physician: Guerry Bruin Treating Physician/Extender: BURNS III, WALTER Weeks in Treatment: 1 Wound Status Wound Number: 1 Primary Venous Leg Ulcer Etiology: Wound Location: Left Lower Leg - Anterior Wound Open Wounding Event: Blister Status: Date Acquired: 07/04/2015 Comorbid Cataracts, Arrhythmia, Deep Vein Weeks Of Treatment: 1 History: Thrombosis, Osteoarthritis Clustered Wound: No Photos Photo Uploaded By: Curtis Sites on 07/25/2015 16:57:01 Wound Measurements Length: (cm) 0.6 Width: (cm) 0.8 Depth: (cm) 0.1 Area: (cm) 0.377 Volume: (cm) 0.038 % Reduction in Area: 98.4% % Reduction in Volume: 98.4% Epithelialization: None Tunneling: No Undermining: No Wound Description Full Thickness Without Exposed Classification: Support Structures Wound Margin: Indistinct, nonvisible Exudate Small Amount: Exudate Type: Serous Exudate Color: amber Wound Bed Granulation Amount: Large (67-100%) Exposed Structure Granulation Quality: Red Fascia Exposed: No Necrotic Amount: None Present (0%) Fat Layer Exposed: No Klima, Madex C. ([id number]) Tendon Exposed: No Muscle Exposed: No Joint  Exposed: No Bone Exposed: No Limited to Skin Breakdown Periwound Skin Texture Texture Color No Abnormalities Noted: No No Abnormalities Noted: No Callus: No Atrophie Blanche: No Crepitus: No Cyanosis: No  Excoriation: No Ecchymosis: No Fluctuance: No Erythema: No Friable: No Hemosiderin Staining: No Induration: No Mottled: No Localized Edema: No Pallor: No Rash: No Rubor: No Scarring: No Moisture No Abnormalities Noted: No Dry / Scaly: No Maceration: No Moist: Yes Wound Preparation Ulcer Cleansing: Rinsed/Irrigated with Saline Topical Anesthetic Applied: None Treatment Notes Wound #1 (Left, Anterior Lower Leg) 1. Cleansed with: Clean wound with Normal Saline 2. Anesthetic Topical Lidocaine 4% cream to wound bed prior to debridement 4. Dressing Applied: Aquacel Ag 5. Secondary Dressing Applied ABD and Kerlix/Conform 6. Footwear/Offloading device applied Tubigrip 7. Secured with Secretary/administrator) Signed: 07/25/2015 5:05:32 PM By: Curtis Sites Entered By: Curtis Sites on 07/25/2015 15:58:47 [name] ([id number]) Jeanie Sewer, [name] ([id number]) -------------------------------------------------------------------------------- Wound Assessment Details Patient Name: Donald Musca C. Date of Service: 07/25/2015 3:30 PM Medical Record Number: [id number] Patient Account Number: [id number] Date of Birth/Sex: [birth year] (81 y.o. Male) Treating RN: Curtis Sites Primary Care Physician: Guerry Bruin Other Clinician: Referring Physician: Guerry Bruin Treating Physician/Extender: BURNS III, WALTER Weeks in Treatment: 1 Wound Status Wound Number: 2 Primary Venous Leg Ulcer Etiology: Wound Location: Left Lower Leg - Medial Wound Open Wounding Event: Blister Status: Date Acquired: 07/04/2015 Comorbid Cataracts, Arrhythmia, Deep Vein Weeks Of Treatment: 1 History: Thrombosis, Osteoarthritis Clustered Wound: No Photos Photo  Uploaded By: Curtis Sites on 07/25/2015 16:56:41 Wound Measurements Length: (cm) 1.7 Width: (cm) 2.5 Depth: (cm) 0.1 Area: (cm) 3.338 Volume: (cm) 0.334 % Reduction in Area: 11.5% % Reduction in Volume: 11.4% Epithelialization: None Tunneling: No Undermining: No Wound Description Full Thickness Without Exposed Classification: Support Structures Wound Margin: Flat and Intact Exudate Large Amount: Exudate Type: Serous Exudate Color: amber Wound Bed Granulation Amount: Small (1-33%) Exposed Structure Granulation Quality: Pink Fascia Exposed: No Necrotic Amount: Large (67-100%) Fat Layer Exposed: No Cu[place]ras, Woodford C. ([id number]) Necrotic Quality: Adherent Slough Tendon Exposed: No Muscle Exposed: No Joint Exposed: No Bone Exposed: No Limited to Skin Breakdown Periwound Skin Texture Texture Color No Abnormalities Noted: No No Abnormalities Noted: No Callus: No Atrophie Blanche: No Crepitus: No Cyanosis: No Excoriation: No Ecchymosis: No Fluctuance: No Erythema: No Friable: No Hemosiderin Staining: No Induration: No Mottled: No Localized Edema: No Pallor: No Rash: No Rubor: No Scarring: No Temperature / Pain Moisture Tenderness on Palpation: Yes No Abnormalities Noted: No Dry / Scaly: No Maceration: No Moist: Yes Wound Preparation Ulcer Cleansing: Rinsed/Irrigated with Saline Topical Anesthetic Applied: Other: lidociane 4%, Treatment Notes Wound #2 (Left, Medial Lower Leg) 1. Cleansed with: Clean wound with Normal Saline 2. Anesthetic Topical Lidocaine 4% cream to wound bed prior to debridement 4. Dressing Applied: Aquacel Ag 5. Secondary Dressing Applied ABD and Kerlix/Conform 6. Footwear/Offloading device applied Tubigrip 7. Secured with Secretary/administrator) Signed: 07/25/2015 5:05:32 PM By: Curtis Sites Entered By: Curtis Sites on 07/25/2015 15:59:06 [name] ([id number]) Jeanie Sewer, [name]  ([id number]) -------------------------------------------------------------------------------- Wound Assessment Details Patient Name: Donald Musca C. Date of Service: 07/25/2015 3:30 PM Medical Record Number: [id number] Patient Account Number: [id number] Date of Birth/Sex: [birth year] (81 y.o. Male) Treating RN: Curtis Sites Primary Care Physician: Guerry Bruin Other Clinician: Referring Physician: Guerry Bruin Treating Physician/Extender: BURNS III, WALTER Weeks in Treatment: 1 Wound Status Wound Number: 3 Primary Venous Leg Ulcer Etiology: Wound Location: Left Malleolus - Medial Wound Open Wounding Event: Gradually Appeared Status: Date Acquired: 05/16/2015 Comorbid Cataracts, Arrhythmia, Deep Vein Weeks Of Treatment: 1 History: Thrombosis, Osteoarthritis Clustered Wound: No Photos Photo Uploaded By: Curtis Sites on 07/25/2015 16:56:20 Wound Measurements Length: (cm) 0.6 Width: (cm)  0.5 Depth: (cm) 0.1 Area: (cm) 0.236 Volume: (cm) 0.024 % Reduction in Area: -151.1% % Reduction in Volume: -166.7% Epithelialization: Small (1-33%) Tunneling: No Undermining: No Wound Description Classification: Partial Thickness Wound Margin: Flat and Intact Exudate Amount: Large Exudate Type: Serous Exudate Color: amber Wound Bed Granulation Amount: Large (67-100%) Exposed Structure Granulation Quality: Pink Fascia Exposed: No Necrotic Amount: None Present (0%) Fat Layer Exposed: No Tendon Exposed: No Grieb, Cassady C. ([id number]) Muscle Exposed: No Joint Exposed: No Bone Exposed: No Limited to Skin Breakdown Periwound Skin Texture Texture Color No Abnormalities Noted: No No Abnormalities Noted: No Callus: No Atrophie Blanche: No Crepitus: No Cyanosis: No Excoriation: No Ecchymosis: No Fluctuance: No Erythema: No Friable: No Hemosiderin Staining: No Induration: No Mottled: No Localized Edema: No Pallor: No Rash: No Rubor: No Scarring:  No Moisture No Abnormalities Noted: No Dry / Scaly: No Maceration: No Moist: Yes Wound Preparation Ulcer Cleansing: Rinsed/Irrigated with Saline Topical Anesthetic Applied: None Treatment Notes Wound #3 (Left, Medial Malleolus) 1. Cleansed with: Clean wound with Normal Saline 3. Peri-wound Care: Skin Prep 4. Dressing Applied: Aquacel Ag 5. Secondary Dressing Applied Bordered Foam Dressing Electronic Signature(s) Signed: 07/25/2015 5:05:32 PM By: Curtis Sites Entered By: Curtis Sites on 07/25/2015 15:59:22 [name] ([id number]) -------------------------------------------------------------------------------- Wound Assessment Details Patient Name: Donald Musca C. Date of Service: 07/25/2015 3:30 PM Medical Record Number: [id number] Patient Account Number: [id number] Date of Birth/Sex: Mar 09, 1929 (81 y.o. Male) Treating RN: Curtis Sites Primary Care Physician: Guerry Bruin Other Clinician: Referring Physician: Guerry Bruin Treating Physician/Extender: BURNS III, WALTER Weeks in Treatment: 1 Wound Status Wound Number: 4 Primary Skin Tear Etiology: Wound Location: Left Lower Leg - Anterior, Proximal Wound Open Status: Wounding Event: Trauma Comorbid Cataracts, Arrhythmia, Deep Vein Date Acquired: 07/23/2015 History: Thrombosis, Osteoarthritis Weeks Of Treatment: 0 Clustered Wound: No Photos Photo Uploaded By: Curtis Sites on 07/25/2015 16:56:20 Wound Measurements Length: (cm) 1.6 Width: (cm) 1.3 Depth: (cm) 0.1 Area: (cm) 1.634 Volume: (cm) 0.163 % Reduction in Area: % Reduction in Volume: Epithelialization: None Tunneling: No Undermining: No Wound Description Classification: Partial Thickness Wound Margin: Flat and Intact Exudate Amount: Small Exudate Type: Serous Exudate Color: amber Foul Odor After Cleansing: No Wound Bed Granulation Amount: Large (67-100%) Exposed Structure Granulation Quality: Red Fascia Exposed:  No Necrotic Amount: None Present (0%) Fat Layer Exposed: No Andes, Jaskarn C. ([id number]) Tendon Exposed: No Muscle Exposed: No Joint Exposed: No Bone Exposed: No Limited to Skin Breakdown Periwound Skin Texture Texture Color No Abnormalities Noted: No No Abnormalities Noted: No Callus: No Atrophie Blanche: No Crepitus: No Cyanosis: No Excoriation: No Ecchymosis: No Fluctuance: No Erythema: No Friable: No Hemosiderin Staining: No Induration: No Mottled: No Localized Edema: No Pallor: No Rash: No Rubor: No Scarring: No Moisture No Abnormalities Noted: No Dry / Scaly: No Maceration: No Moist: Yes Wound Preparation Ulcer Cleansing: Rinsed/Irrigated with Saline Topical Anesthetic Applied: None Treatment Notes Wound #4 (Left, Proximal, Anterior Lower Leg) 1. Cleansed with: Clean wound with Normal Saline 2. Anesthetic Topical Lidocaine 4% cream to wound bed prior to debridement 4. Dressing Applied: Aquacel Ag 5. Secondary Dressing Applied ABD and Kerlix/Conform 6. Footwear/Offloading device applied Tubigrip 7. Secured with Secretary/administrator) Signed: 07/25/2015 5:05:32 PM By: Curtis Sites Entered By: Curtis Sites on 07/25/2015 15:58:21 [name] ([id number]) Jeanie Sewer, [name] ([id number]) -------------------------------------------------------------------------------- Vitals Details Patient Name: Donald Musca C. Date of Service: 07/25/2015 3:30 PM Medical Record Number: [id number] Patient Account Number: [id number] Date of Birth/Sex: Oct 05, 1928 (81 y.o. Male) Treating RN: Curtis Sites  Primary Care Physician: Guerry BruinISOVEC, RICHARD Other Clinician: Referring Physician: Guerry BruinISOVEC, RICHARD Treating Physician/Extender: BURNS III, WALTER Weeks in Treatment: 1 Vital Signs Time Taken: 15:44 Temperature (F): 98.1 Height (in): 70 Pulse (bpm): 63 Weight (lbs): 144 Respiratory Rate (breaths/min): 18 Body Mass Index (BMI): 20.7 Blood  Pressure (mmHg): 80/42 Reference Range: 80 - 120 mg / dl Electronic Signature(s) Signed: 07/25/2015 5:05:32 PM By: Curtis Sitesorthy, Joanna Entered By: Curtis Sitesorthy, Joanna on 07/25/2015 15:45:24

## 2015-07-27 DIAGNOSIS — L97929 Non-pressure chronic ulcer of unspecified part of left lower leg with unspecified severity: Secondary | ICD-10-CM | POA: Diagnosis not present

## 2015-08-01 ENCOUNTER — Encounter: Payer: Medicare Other | Admitting: Surgery

## 2015-08-01 DIAGNOSIS — L97221 Non-pressure chronic ulcer of left calf limited to breakdown of skin: Secondary | ICD-10-CM | POA: Diagnosis not present

## 2015-08-01 DIAGNOSIS — I872 Venous insufficiency (chronic) (peripheral): Secondary | ICD-10-CM | POA: Diagnosis not present

## 2015-08-01 DIAGNOSIS — I890 Lymphedema, not elsewhere classified: Secondary | ICD-10-CM | POA: Diagnosis not present

## 2015-08-01 DIAGNOSIS — I83222 Varicose veins of left lower extremity with both ulcer of calf and inflammation: Secondary | ICD-10-CM | POA: Diagnosis not present

## 2015-08-01 DIAGNOSIS — L03116 Cellulitis of left lower limb: Secondary | ICD-10-CM | POA: Diagnosis not present

## 2015-08-01 DIAGNOSIS — L97321 Non-pressure chronic ulcer of left ankle limited to breakdown of skin: Secondary | ICD-10-CM | POA: Diagnosis not present

## 2015-08-02 NOTE — Progress Notes (Addendum)
[name], [name] ([id number]) Visit Report for 08/01/2015 Arrival Information Details Patient Name: [name], [name]. Date of Service: 08/01/2015 2:15 PM Medical Record Number: [id number] Patient Account Number: [id number] Date of Birth/Sex: 01/30/1929 (81 y.o. Male) Treating RN: Clover Mealy, RN, [name], [place] Sink Primary Care Physician: Guerry Bruin Other Clinician: Referring Physician: Guerry Bruin Treating Physician/Extender: BURNS III, Regis Bill in Treatment: 2 Visit Information History Since Last Visit Any new allergies or adverse reactions: No Patient Arrived: Wheel Chair Had a fall or experienced change in No Arrival Time: 14:24 activities of daily living that may affect Accompanied By: wife risk of falls: Transfer Assistance: None Signs or symptoms of abuse/neglect since last No Patient Identification Verified: Yes visito Secondary Verification Process Yes Has Dressing in Place as Prescribed: Yes Completed: Pain Present Now: No Patient Has Alerts: Yes Patient Alerts: Patient on Blood Thinner warfarin NOT Diabetic Electronic Signature(s) Signed: 08/01/2015 2:24:44 PM By: Elpidio Eric [name], RN Entered By: Elpidio Eric on 08/01/2015 14:24:44 [name] ([id number]) -------------------------------------------------------------------------------- Encounter Discharge Information Details Patient Name: Donald Musca C. Date of Service: 08/01/2015 2:15 PM Medical Record Number: [id number] Patient Account Number: [id number] Date of Birth/Sex: [birth year] (81 y.o. Male) Treating RN: Clover Mealy, RN, [name], [place] Sink Primary Care Physician: Guerry Bruin Other Clinician: Referring Physician: Guerry Bruin Treating Physician/Extender: BURNS III, Regis Bill in Treatment: 2 Encounter Discharge Information Items Schedule Follow-up Appointment: No Medication Reconciliation completed and provided to Patient/Care No Terrie Haring: Provided on Clinical Summary of  Care: 08/01/2015 Form Type Recipient Paper Patient RW Electronic Signature(s) Signed: 08/01/2015 2:50:54 PM By: Gwenlyn Perking Entered By: Gwenlyn Perking on 08/01/2015 14:50:54 [name] ([id number]) -------------------------------------------------------------------------------- Lower Extremity Assessment Details Patient Name: Donald Musca C. Date of Service: 08/01/2015 2:15 PM Medical Record Number: [id number] Patient Account Number: [id number] Date of Birth/Sex: [birth year] (81 y.o. Male) Treating RN: Clover Mealy, RN, [name], [place] Sink Primary Care Physician: Guerry Bruin Other Clinician: Referring Physician: Guerry Bruin Treating Physician/Extender: BURNS III, Regis Bill in Treatment: 2 Edema Assessment Assessed: [Left: No] [Right: No] E[Left: dema] [Right: :] Calf Left: Right: Point of Measurement: 33 cm From Medial Instep 32.5 cm cm Ankle Left: Right: Point of Measurement: 12 cm From Medial Instep 23.3 cm cm Vascular Assessment Claudication: Claudication Assessment [Left:None] [Right:None] Pulses: Posterior Tibial Dorsalis Pedis Palpable: [Left:Yes] [Right:Yes] Extremity colors, hair growth, and conditions: Extremity Color: [Left:Mottled] [Right:Mottled] Hair Growth on Extremity: [Left:Yes] [Right:Yes] Temperature of Extremity: [Left:Warm] [Right:Warm] Capillary Refill: [Left:< 3 seconds] [Right:< 3 seconds] Electronic Signature(s) Signed: 08/01/2015 2:28:41 PM By: Elpidio Eric [name], RN Entered By: Elpidio Eric on 08/01/2015 14:28:41 [name] ([id number]) -------------------------------------------------------------------------------- Multi Wound Chart Details Patient Name: Donald Musca C. Date of Service: 08/01/2015 2:15 PM Medical Record Number: [id number] Patient Account Number: [id number] Date of Birth/Sex: 06/02/1929 (81 y.o. Male) Treating RN: Clover Mealy, RN, [name], [place] Sink Primary Care Physician: Guerry Bruin Other Clinician: Referring  Physician: Guerry Bruin Treating Physician/Extender: BURNS III, WALTER Weeks in Treatment: 2 Vital Signs Height(in): 70 Pulse(bpm): 65 Weight(lbs): 144 Blood Pressure 106/41 (mmHg): Body Mass Index(BMI): 21 Temperature(F): 98.1 Respiratory Rate 18 (breaths/min): Photos: [1:No Photos] [2:No Photos] [3:No Photos] Wound Location: [1:Left Lower Leg - Anterior] [2:Left Lower Leg - Medial] [3:Left Malleolus - Medial] Wounding Event: [1:Blister] [2:Blister] [3:Gradually Appeared] Primary Etiology: [1:Venous Leg Ulcer] [2:Venous Leg Ulcer] [3:Venous Leg Ulcer] Comorbid History: [1:Cataracts, Arrhythmia, Deep Vein Thrombosis, Osteoarthritis] [2:Cataracts, Arrhythmia, Deep Vein Thrombosis, Osteoarthritis] [3:Cataracts, Arrhythmia, Deep Vein Thrombosis, Osteoarthritis] Date Acquired: [1:07/04/2015] [2:07/04/2015] [3:05/16/2015] Weeks of Treatment: [1:2] [2:2] [3:2] Wound Status: [1:Healed - Epithelialized] [2:Open] [3:Open] Measurements  L x W x D 0x0x0 [2:2x3x0.1] [3:0.5x0.5x0.1] (cm) Area (cm) : [1:0] [2:4.712] [3:0.196] Volume (cm) : [1:0] [2:0.471] [3:0.02] % Reduction in Area: [1:100.00%] [2:-25.00%] [3:-108.50%] % Reduction in Volume: 100.00% [2:-24.90%] [3:-122.20%] Classification: [1:Full Thickness Without Exposed Support Structures] [2:Full Thickness Without Exposed Support Structures] [3:Partial Thickness] Exudate Amount: [1:None Present] [2:Small] [3:Small] Exudate Type: [1:N/A] [2:Serous] [3:Serous] Exudate Color: [1:N/A] [2:amber] [3:amber] Wound Margin: [1:Indistinct, nonvisible] [2:Flat and Intact] [3:Flat and Intact] Granulation Amount: [1:Large (67-100%)] [2:Small (1-33%)] [3:Large (67-100%)] Granulation Quality: [1:Red] [2:Pink] [3:Pink] Necrotic Amount: [1:None Present (0%)] [2:None Present (0%)] [3:None Present (0%)] Exposed Structures: [1:Fascia: No Fat: No Tendon: No Muscle: No] [2:Fascia: No Fat: No Tendon: No Muscle: No] [3:Fascia: No Fat: No Tendon: No  Muscle: No] Joint: No Joint: No Joint: No Bone: No Bone: No Bone: No Limited to Skin Limited to Skin Limited to Skin Breakdown Breakdown Breakdown Epithelialization: Large (67-100%) None Small (1-33%) Periwound Skin Texture: Edema: Yes Edema: Yes Edema: Yes Excoriation: No Excoriation: No Excoriation: No Induration: No Induration: No Induration: No Callus: No Callus: No Callus: No Crepitus: No Crepitus: No Crepitus: No Fluctuance: No Fluctuance: No Fluctuance: No Friable: No Friable: No Friable: No Rash: No Rash: No Rash: No Scarring: No Scarring: No Scarring: No Periwound Skin Dry/Scaly: Yes Moist: Yes Moist: Yes Moisture: Maceration: No Dry/Scaly: Yes Dry/Scaly: Yes Moist: No Maceration: No Maceration: No Periwound Skin Color: Atrophie Blanche: No Atrophie Blanche: No Atrophie Blanche: No Cyanosis: No Cyanosis: No Cyanosis: No Ecchymosis: No Ecchymosis: No Ecchymosis: No Erythema: No Erythema: No Erythema: No Hemosiderin Staining: No Hemosiderin Staining: No Hemosiderin Staining: No Mottled: No Mottled: No Mottled: No Pallor: No Pallor: No Pallor: No Rubor: No Rubor: No Rubor: No Temperature: No Abnormality N/A No Abnormality Tenderness on No Yes Yes Palpation: Wound Preparation: Ulcer Cleansing: Ulcer Cleansing: Ulcer Cleansing: Rinsed/Irrigated with Rinsed/Irrigated with Rinsed/Irrigated with Saline Saline Saline Topical Anesthetic Topical Anesthetic Topical Anesthetic Applied: None Applied: Other: lidociane Applied: None 4% Wound Number: 4 N/A N/A Photos: No Photos N/A N/A Wound Location: Left Lower Leg - Anterior, N/A N/A Proximal Wounding Event: Trauma N/A N/A Primary Etiology: Skin Tear N/A N/A Comorbid History: Cataracts, Arrhythmia, N/A N/A Deep Vein Thrombosis, Osteoarthritis Date Acquired: 07/23/2015 N/A N/A Weeks of Treatment: 1 N/A N/A Wound Status: Open N/A N/A Measurements L x W x D 4.5x2x0.1 N/A  N/A (cm) Gervin, Xavien C. ([id number]) Area (cm) : 7.069 N/A N/A Volume (cm) : 0.707 N/A N/A % Reduction in Area: -332.60% N/A N/A % Reduction in Volume: -333.70% N/A N/A Classification: Partial Thickness N/A N/A Exudate Amount: Small N/A N/A Exudate Type: Serous N/A N/A Exudate Color: amber N/A N/A Wound Margin: Flat and Intact N/A N/A Granulation Amount: Large (67-100%) N/A N/A Granulation Quality: Red N/A N/A Necrotic Amount: None Present (0%) N/A N/A Exposed Structures: Fascia: No N/A N/A Fat: No Tendon: No Muscle: No Joint: No Bone: No Limited to Skin Breakdown Epithelialization: None N/A N/A Periwound Skin Texture: Edema: Yes N/A N/A Excoriation: No Induration: No Callus: No Crepitus: No Fluctuance: No Friable: No Rash: No Scarring: No Periwound Skin Moist: Yes N/A N/A Moisture: Maceration: No Dry/Scaly: No Periwound Skin Color: Atrophie Blanche: No N/A N/A Cyanosis: No Ecchymosis: No Erythema: No Hemosiderin Staining: No Mottled: No Pallor: No Rubor: No Temperature: No Abnormality N/A N/A Tenderness on Yes N/A N/A Palpation: Wound Preparation: Ulcer Cleansing: N/A N/A Rinsed/Irrigated with Saline Topical Anesthetic Applied: None EZEKIEL, MENZER ([id number]) Treatment Notes Electronic Signature(s) Signed: 08/01/2015 2:41:45 PM By: Elpidio Eric [name], RN  Entered By: Elpidio Eric on 08/01/2015 14:41:45 [name] ([id number]) -------------------------------------------------------------------------------- Multi-Disciplinary Care Plan Details Patient Name: SCOTT, VANDERVEER C. Date of Service: 08/01/2015 2:15 PM Medical Record Number: [id number] Patient Account Number: [id number] Date of Birth/Sex: 08-27-1929 (81 y.o. Male) Treating RN: Clover Mealy, RN, [name], [place] Sink Primary Care Physician: Guerry Bruin Other Clinician: Referring Physician: Guerry Bruin Treating Physician/Extender: BURNS III, Regis Bill in Treatment: 2 Active  Inactive Orientation to the Wound Care Program Nursing Diagnoses: Knowledge deficit related to the wound healing center program Goals: Patient/caregiver will verbalize understanding of the Wound Healing Center Program Date Initiated: 07/18/2015 Goal Status: Active Interventions: Provide education on orientation to the wound center Notes: Venous Leg Ulcer Nursing Diagnoses: Actual venous Insuffiency (use after diagnosis is confirmed) Goals: Non-invasive venous studies are completed as ordered Date Initiated: 07/18/2015 Goal Status: Active Interventions: Assess peripheral edema status every visit. Treatment Activities: Non-invasive vascular studies : 08/01/2015 Notes: Wound/Skin Impairment Nursing Diagnoses: Knowledge deficit related to ulceration/compromised skin integrity Hooley, Newell C. ([id number]) Goals: Ulcer/skin breakdown will heal within 14 weeks Date Initiated: 07/18/2015 Goal Status: Active Interventions: Assess patient/caregiver ability to obtain necessary supplies Notes: Electronic Signature(s) Signed: 08/01/2015 2:41:35 PM By: Elpidio Eric [name], RN Entered By: Elpidio Eric on 08/01/2015 14:41:34 Pedone, [name] ([id number]) -------------------------------------------------------------------------------- Pain Assessment Details Patient Name: Donald Musca C. Date of Service: 08/01/2015 2:15 PM Medical Record Number: [id number] Patient Account Number: [id number] Date of Birth/Sex: [birth year] (81 y.o. Male) Treating RN: Clover Mealy, RN, [name], [place] Sink Primary Care Physician: Guerry Bruin Other Clinician: Referring Physician: Guerry Bruin Treating Physician/Extender: BURNS III, Regis Bill in Treatment: 2 Active Problems Location of Pain Severity and Description of Pain Patient Has Paino No Site Locations Pain Management and Medication Current Pain Management: Electronic Signature(s) Signed: 08/01/2015 2:24:50 PM By: Elpidio Eric [name], RN Entered By:  Elpidio Eric on 08/01/2015 14:24:50 [name] ([id number]) -------------------------------------------------------------------------------- Wound Assessment Details Patient Name: Donald Musca C. Date of Service: 08/01/2015 2:15 PM Medical Record Number: [id number] Patient Account Number: [id number] Date of Birth/Sex: 02/15/1929 (81 y.o. Male) Treating RN: Clover Mealy, RN, [name], [place] Sink Primary Care Physician: Guerry Bruin Other Clinician: Referring Physician: Guerry Bruin Treating Physician/Extender: BURNS III, WALTER Weeks in Treatment: 2 Wound Status Wound Number: 1 Primary Venous Leg Ulcer Etiology: Wound Location: Left Lower Leg - Anterior Wound Healed - Epithelialized Wounding Event: Blister Status: Date Acquired: 07/04/2015 Comorbid Cataracts, Arrhythmia, Deep Vein Weeks Of Treatment: 2 History: Thrombosis, Osteoarthritis Clustered Wound: No Photos Photo Uploaded By: Elpidio Eric on 08/01/2015 16:57:57 Wound Measurements Length: (cm) 0 % Reduction i Width: (cm) 0 % Reduction i Depth: (cm) 0 Epithelializa Area: (cm) 0 Tunneling: Volume: (cm) 0 Undermining: n Area: 100% n Volume: 100% tion: Large (67-100%) No No Wound Description Full Thickness Without Exposed Classification: Support Structures Wound Margin: Indistinct, nonvisible Exudate None Present Amount: Wound Bed Granulation Amount: Large (67-100%) Exposed Structure Granulation Quality: Red Fascia Exposed: No Necrotic Amount: None Present (0%) Fat Layer Exposed: No Tendon Exposed: No Muscle Exposed: No Bloodsaw, Garner C. ([id number]) Joint Exposed: No Bone Exposed: No Limited to Skin Breakdown Periwound Skin Texture Texture Color No Abnormalities Noted: No No Abnormalities Noted: No Callus: No Atrophie Blanche: No Crepitus: No Cyanosis: No Excoriation: No Ecchymosis: No Fluctuance: No Erythema: No Friable: No Hemosiderin Staining: No Induration: No Mottled: No Localized  Edema: Yes Pallor: No Rash: No Rubor: No Scarring: No Temperature / Pain Moisture Temperature: No Abnormality No Abnormalities Noted: No Dry / Scaly: Yes Maceration: No Moist: No Wound Preparation Ulcer Cleansing: Rinsed/Irrigated with  Saline Topical Anesthetic Applied: None Electronic Signature(s) Signed: 08/01/2015 2:37:30 PM By: Elpidio Eric [name], RN Entered By: Elpidio Eric on 08/01/2015 14:37:30 [name] ([id number]) -------------------------------------------------------------------------------- Wound Assessment Details Patient Name: Donald Musca C. Date of Service: 08/01/2015 2:15 PM Medical Record Number: [id number] Patient Account Number: [id number] Date of Birth/Sex: August 03, 1929 (81 y.o. Male) Treating RN: Clover Mealy, RN, [name], [place] Sink Primary Care Physician: Guerry Bruin Other Clinician: Referring Physician: Guerry Bruin Treating Physician/Extender: BURNS III, WALTER Weeks in Treatment: 2 Wound Status Wound Number: 2 Primary Venous Leg Ulcer Etiology: Wound Location: Left Lower Leg - Medial Wound Open Wounding Event: Blister Status: Date Acquired: 07/04/2015 Comorbid Cataracts, Arrhythmia, Deep Vein Weeks Of Treatment: 2 History: Thrombosis, Osteoarthritis Clustered Wound: No Photos Photo Uploaded By: Elpidio Eric on 08/01/2015 16:57:57 Wound Measurements Length: (cm) 2 Width: (cm) 3 Depth: (cm) 0.1 Area: (cm) 4.712 Volume: (cm) 0.471 % Reduction in Area: -25% % Reduction in Volume: -24.9% Epithelialization: None Undermining: No Wound Description Full Thickness Without Exposed Classification: Support Structures Wound Margin: Flat and Intact Exudate Small Amount: Leven, Kiyoshi C. ([id number]) Exudate Type: Serous Exudate Color: amber Wound Bed Granulation Amount: Small (1-33%) Exposed Structure Granulation Quality: Pink Fascia Exposed: No Necrotic Amount: None Present (0%) Fat Layer Exposed: No Tendon Exposed: No Muscle  Exposed: No Joint Exposed: No Bone Exposed: No Limited to Skin Breakdown Periwound Skin Texture Texture Color No Abnormalities Noted: No No Abnormalities Noted: No Callus: No Atrophie Blanche: No Crepitus: No Cyanosis: No Excoriation: No Ecchymosis: No Fluctuance: No Erythema: No Friable: No Hemosiderin Staining: No Induration: No Mottled: No Localized Edema: Yes Pallor: No Rash: No Rubor: No Scarring: No Temperature / Pain Moisture Tenderness on Palpation: Yes No Abnormalities Noted: No Dry / Scaly: Yes Maceration: No Moist: Yes Wound Preparation Ulcer Cleansing: Rinsed/Irrigated with Saline Topical Anesthetic Applied: Other: lidociane 4%, Electronic Signature(s) Signed: 08/01/2015 2:36:16 PM By: Elpidio Eric [name], RN Entered By: Elpidio Eric on 08/01/2015 14:36:16 [name] ([id number]) -------------------------------------------------------------------------------- Wound Assessment Details Patient Name: Donald Musca C. Date of Service: 08/01/2015 2:15 PM Medical Record Number: [id number] Patient Account Number: [id number] Date of Birth/Sex: [birth year] (81 y.o. Male) Treating RN: Clover Mealy, RN, [name], [place] Sink Primary Care Physician: Guerry Bruin Other Clinician: Referring Physician: Guerry Bruin Treating Physician/Extender: BURNS III, WALTER Weeks in Treatment: 2 Wound Status Wound Number: 3 Primary Venous Leg Ulcer Etiology: Wound Location: Left Malleolus - Medial Wound Open Wounding Event: Gradually Appeared Status: Date Acquired: 05/16/2015 Comorbid Cataracts, Arrhythmia, Deep Vein Weeks Of Treatment: 2 History: Thrombosis, Osteoarthritis Clustered Wound: No Photos Photo Uploaded By: Elpidio Eric on 08/01/2015 16:57:57 Wound Measurements Length: (cm) 0.5 Width: (cm) 0.5 Depth: (cm) 0.1 Area: (cm) 0.196 Volume: (cm) 0.02 % Reduction in Area: -108.5% % Reduction in Volume: -122.2% Epithelialization: Small (1-33%) Tunneling:  No Undermining: No Wound Description Classification: Partial Thickness Wound Margin: Flat and Intact Exudate Amount: Small Exudate Type: Serous Exudate Color: amber Bruna, Corneilus C. ([id number]) Wound Bed Granulation Amount: Large (67-100%) Exposed Structure Granulation Quality: Pink Fascia Exposed: No Necrotic Amount: None Present (0%) Fat Layer Exposed: No Tendon Exposed: No Muscle Exposed: No Joint Exposed: No Bone Exposed: No Limited to Skin Breakdown Periwound Skin Texture Texture Color No Abnormalities Noted: No No Abnormalities Noted: No Callus: No Atrophie Blanche: No Crepitus: No Cyanosis: No Excoriation: No Ecchymosis: No Fluctuance: No Erythema: No Friable: No Hemosiderin Staining: No Induration: No Mottled: No Localized Edema: Yes Pallor: No Rash: No Rubor: No Scarring: No Temperature / Pain Moisture Temperature: No Abnormality No Abnormalities Noted: No  Tenderness on Palpation: Yes Dry / Scaly: Yes Maceration: No Moist: Yes Wound Preparation Ulcer Cleansing: Rinsed/Irrigated with Saline Topical Anesthetic Applied: None Electronic Signature(s) Signed: 08/01/2015 2:36:39 PM By: Elpidio EricAfful, Rita [name], RN Entered By: Elpidio EricAfful, Rita on 08/01/2015 14:36:39 [place], [name] ([id number]) -------------------------------------------------------------------------------- Wound Assessment Details Patient Name: Donald MuscaWILLIFORD, [name] Date of Service: 08/01/2015 2:15 PM Medical Record Number: [id number] Patient Account Number: [id number] Date of Birth/Sex: 01/07/1929 [age] y.o. Male) Treating RN: Clover MealyAfful, RN, [name], [place] Primary Care Physician: Guerry BruinISOVEC, RICHARD Other Clinician: Referring Physician: Guerry BruinISOVEC, RICHARD Treating Physician/Extender: BURNS III, WALTER Weeks in Treatment: 2 Wound Status Wound Number: 4 Primary Skin Tear Etiology: Wound Location: Left Lower Leg - Anterior, Proximal Wound Open Status: Wounding Event: Trauma Comorbid Cataracts, Arrhythmia,  Deep Vein Date Acquired: 07/23/2015 History: Thrombosis, Osteoarthritis Weeks Of Treatment: 1 Clustered Wound: No Photos Photo Uploaded By: Elpidio EricAfful, Rita on 08/01/2015 16:58:32 Wound Measurements Length: (cm) 4.5 Width: (cm) 2 Depth: (cm) 0.1 Area: (cm) 7.069 Volume: (cm) 0.707 % Reduction in Area: -332.6% % Reduction in Volume: -333.7% Epithelialization: None Tunneling: No Undermining: No Wound Description Classification: Partial Thickness Wound Margin: Flat and Intact Exudate Amount: Small Exudate Type: Serous Exudate Color: amber Foul Odor After Cleansing: No Wound Bed Granulation Amount: Large (67-100%) Exposed Structure Granulation Quality: Red Fascia Exposed: No Necrotic Amount: None Present (0%) Fat Layer Exposed: No Klepacki, Prentis C. ([id number]) Tendon Exposed: No Muscle Exposed: No Joint Exposed: No Bone Exposed: No Limited to Skin Breakdown Periwound Skin Texture Texture Color No Abnormalities Noted: No No Abnormalities Noted: No Callus: No Atrophie Blanche: No Crepitus: No Cyanosis: No Excoriation: No Ecchymosis: No Fluctuance: No Erythema: No Friable: No Hemosiderin Staining: No Induration: No Mottled: No Localized Edema: Yes Pallor: No Rash: No Rubor: No Scarring: No Temperature / Pain Moisture Temperature: No Abnormality No Abnormalities Noted: No Tenderness on Palpation: Yes Dry / Scaly: No Maceration: No Moist: Yes Wound Preparation Ulcer Cleansing: Rinsed/Irrigated with Saline Topical Anesthetic Applied: None Electronic Signature(s) Signed: 08/01/2015 2:37:01 PM By: Elpidio EricAfful, Rita [name], RN Entered By: Elpidio EricAfful, Rita on 08/01/2015 14:37:01 Georges MouseWILLIFORD, [name] ([id number]) -------------------------------------------------------------------------------- Vitals Details Patient Name: Donald MuscaWILLIFORD, [name] Date of Service: 08/01/2015 2:15 PM Medical Record Number: [id number] Patient Account Number: [id number] Date of Birth/Sex: 03/24/1929  [age] y.o. Male) Treating RN: Clover MealyAfful, RN, [name], Rita Primary Care Physician: Guerry BruinISOVEC, RICHARD Other Clinician: Referring Physician: Guerry BruinISOVEC, RICHARD Treating Physician/Extender: BURNS III, WALTER Weeks in Treatment: 2 Vital Signs Time Taken: 14:29 Temperature (F): 98.1 Height (in): 70 Pulse (bpm): 65 Weight (lbs): 144 Respiratory Rate (breaths/min): 18 Body Mass Index (BMI): 20.7 Blood Pressure (mmHg): 106/41 Reference Range: 80 - 120 mg / dl Electronic Signature(s) Signed: 08/01/2015 2:29:02 PM By: Elpidio EricAfful, Rita [name], RN Entered By: Elpidio EricAfful, Rita on 08/01/2015 14:29:02

## 2015-08-03 NOTE — Progress Notes (Signed)
TAJEE, SAVANT ([id number]) Visit Report for 08/01/2015 Chief Complaint Document Details Patient Name: [name], [name]. Date of Service: 08/01/2015 2:15 PM Medical Record Number: [id number] Patient Account Number: [id number] Date of Birth/Sex: 28-Aug-1929 (81 y.o. Male) Treating RN: Clover Mealy, RN, [name], [place] Sink Primary Care Physician: Guerry Bruin Other Clinician: Referring Physician: Guerry Bruin Treating Physician/Extender: BURNS III, Regis Bill in Treatment: 2 Information Obtained from: Patient Chief Complaint Left calf and ankle ulcerations. Electronic Signature(s) Signed: 08/01/2015 3:24:44 PM By: Madelaine Bhat MD Entered By: Madelaine Bhat on 08/01/2015 14:49:53 [name] ([id number]) -------------------------------------------------------------------------------- Debridement Details Patient Name: Donald Musca C. Date of Service: 08/01/2015 2:15 PM Medical Record Number: [id number] Patient Account Number: [id number] Date of Birth/Sex: 06-23-1929 (81 y.o. Male) Treating RN: Afful, RN, [name], Rita Primary Care Physician: Guerry Bruin Other Clinician: Referring Physician: Guerry Bruin Treating Physician/Extender: BURNS III, Regis Bill in Treatment: 2 Debridement Performed for Wound #3 Left,Medial Malleolus Assessment: Performed By: Physician BURNS III, Melanie Crazier., MD Debridement: Open Wound/Selective Debridement Selective Description: Pre-procedure Yes Verification/Time Out Taken: Start Time: 14:42 Pain Control: Lidocaine 4% Topical Solution Level: Non-Viable Tissue Total Area Debrided (L x 0.5 (cm) x 0.5 (cm) = 0.25 (cm) W): Tissue and other Viable, Non-Viable, Fibrin/Slough, Skin, Subcutaneous material debrided: Instrument: Curette Bleeding: Minimum Hemostasis Achieved: Pressure End Time: 14:44 Procedural Pain: 0 Post Procedural Pain: 0 Response to Treatment: Procedure was tolerated well Post Debridement Measurements of  Total Wound Length: (cm) 0.5 Width: (cm) 0.5 Depth: (cm) 0.2 Volume: (cm) 0.039 Post Procedure Diagnosis Same as Pre-procedure Electronic Signature(s) Signed: 08/01/2015 3:24:44 PM By: Madelaine Bhat MD Signed: 08/01/2015 5:00:53 PM By: Elpidio Eric [name], RN Previous Signature: 08/01/2015 2:43:44 PM Version By: Elpidio Eric [name], RN Entered By: Madelaine Bhat on 08/01/2015 14:49:16 [name], [name] ([id number]) [name], [name] ([id number]) -------------------------------------------------------------------------------- Debridement Details Patient Name: Donald Musca C. Date of Service: 08/01/2015 2:15 PM Medical Record Number: [id number] Patient Account Number: [id number] Date of Birth/Sex: 1929/01/05 (81 y.o. Male) Treating RN: Clover Mealy, RN, [name], Rita Primary Care Physician: Guerry Bruin Other Clinician: Referring Physician: Guerry Bruin Treating Physician/Extender: BURNS III, Regis Bill in Treatment: 2 Debridement Performed for Wound #4 Left,Proximal,Anterior Lower Leg Assessment: Performed By: Physician BURNS III, Melanie Crazier., MD Debridement: Open Wound/Selective Debridement Selective Description: Pre-procedure Yes Verification/Time Out Taken: Start Time: 14:40 Pain Control: Lidocaine 4% Topical Solution Level: Non-Viable Tissue Total Area Debrided (L x 4.5 (cm) x 2 (cm) = 9 (cm) W): Tissue and other Non-Viable, Skin, Subcutaneous material debrided: Instrument: Curette Bleeding: Minimum Hemostasis Achieved: Pressure End Time: 14:42 Procedural Pain: 0 Post Procedural Pain: 0 Response to Treatment: Procedure was tolerated well Post Debridement Measurements of Total Wound Length: (cm) 4.5 Width: (cm) 2 Depth: (cm) 0.2 Volume: (cm) 1.414 Post Procedure Diagnosis Same as Pre-procedure Electronic Signature(s) Signed: 08/01/2015 3:24:44 PM By: Madelaine Bhat MD Signed: 08/01/2015 5:00:53 PM By: Elpidio Eric [name], RN Previous Signature:  08/01/2015 2:43:06 PM Version By: Elpidio Eric [name], RN Entered By: Madelaine Bhat on 08/01/2015 14:49:39 [name], [name] ([id number]) [name], [name] ([id number]) -------------------------------------------------------------------------------- HPI Details Patient Name: Donald Musca C. Date of Service: 08/01/2015 2:15 PM Medical Record Number: [id number] Patient Account Number: [id number] Date of Birth/Sex: 02/08/1929 (81 y.o. Male) Treating RN: Clover Mealy, RN, [name], [place] Sink Primary Care Physician: Guerry Bruin Other Clinician: Referring Physician: Guerry Bruin Treating Physician/Extender: BURNS III, Regis Bill in Treatment: 2 History of Present Illness HPI Description: Pleasant 81 year old with history of chronic venous insufficiency, remote history of DVT, atrial fibrillation (  on Coumadin). No history of diabetes. He says that he developed worsening left lower extremity swelling, blisters, and subsequent ulcerations in late October 2016. He was started on Augmentin for left lower extremity cellulitis, which has improved. Arterial ultrasound 07/27/2015 showed no significant peripheral arterial disease. Awaiting venous ultrasound. Performing dressing changes with silver alginate and using a Tubigrip for edema control. He denies any significant pain. No claudication or rest pain. Ambulating per his baseline with a walker. No fever or chills. Moderate drainage. Electronic Signature(s) Signed: 08/01/2015 3:24:44 PM By: Madelaine Bhat MD Entered By: Madelaine Bhat on 08/01/2015 14:50:51 [name] ([id number]) -------------------------------------------------------------------------------- Physical Exam Details Patient Name: Donald Musca C. Date of Service: 08/01/2015 2:15 PM Medical Record Number: [id number] Patient Account Number: [id number] Date of Birth/Sex: 05-30-1929 (81 y.o. Male) Treating RN: Clover Mealy, RN, [name], [place] Sink Primary Care Physician: Guerry Bruin Other Clinician: Referring Physician: Guerry Bruin Treating Physician/Extender: BURNS III, Pru[place] Taboada Weeks in Treatment: 2 Constitutional . Pulse regular. Respirations normal and unlabored. Afebrile. . Notes Multiple left calf and left ankle ulcerations. Partial and full-thickness. Minimal residual cellulitis. 1+ pitting edema. No palpable pedal pulses. Dopplerable DP and PT. Electronic Signature(s) Signed: 08/01/2015 3:24:44 PM By: Madelaine Bhat MD Entered By: Madelaine Bhat on 08/01/2015 14:51:29 [name] ([id number]) -------------------------------------------------------------------------------- Physician Orders Details Patient Name: [name] Date of Service: 08/01/2015 2:15 PM Medical Record Number: [id number] Patient Account Number: [id number] Date of Birth/Sex: 02-27-1929 (81 y.o. 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 o May Shower, gently pat wound dry prior to applying new dressing. Wound #3 Left,Medial Malleolus o Clean wound with Normal Saline. o May Shower, gently pat wound dry prior to applying new dressing. Wound #4 Left,Proximal,Anterior Lower Leg o Clean wound with Normal Saline. o May Shower, gently pat wound dry prior to applying new  dressing. Anesthetic Wound #2 Left,Medial Lower Leg o Topical Lidocaine 4% cream applied to wound bed prior to debridement Wound #3 Left,Medial Malleolus o Topical Lidocaine 4% cream applied to wound bed prior to debridement Wound #4 Left,Proximal,Anterior Lower Leg o Topical Lidocaine 4% cream applied to wound bed prior to debridement Primary Wound Dressing Wound #2 Left,Medial Lower Leg o Aquacel Ag JALYNN, BETZOLD C. ([id number]) Wound #3 Left,Medial Malleolus o Aquacel Ag Wound #4 Left,Proximal,Anterior Lower Leg o Aquacel Ag Secondary Dressing Wound #2 Left,Medial Lower Leg o ABD and Kerlix/Conform Wound #3 Left,Medial Malleolus o Boardered Foam Dressing Wound #4 Left,Proximal,Anterior Lower Leg o ABD and Kerlix/Conform Dressing Change Frequency Wound #2 Left,Medial Lower Leg o Change dressing every other day. Wound #3 Left,Medial Malleolus o Change dressing every other day. Wound #4 Left,Proximal,Anterior Lower Leg o Change dressing every other day. Follow-up Appointments Wound #2 Left,Medial Lower Leg o Return Appointment in 1 week. Wound #3 Left,Medial Malleolus o Return Appointment in 1 week. Wound #4 Left,Proximal,Anterior Lower Leg o Return Appointment in 1 week. Edema Control Wound #2 Left,Medial Lower Leg o Elevate legs  to the level of the heart and pump ankles as often as possible o Tubigrip Wound #3 Left,Medial Malleolus o Elevate legs to the level of the heart and pump ankles as often as possible o Tubigrip Wound #4 Left,Proximal,Anterior Lower Leg o Elevate legs to the level of the heart and pump ankles as often as possible Oliveri, Satish C. ([id number]) o Tubigrip Additional Orders / Instructions Wound #2 Left,Medial Lower Leg o Increase protein intake. Wound #3 Left,Medial Malleolus o Increase protein intake. Wound #4 Left,Proximal,Anterior Lower Leg o Increase protein intake. Electronic  Signature(s) Signed: 08/01/2015 2:50:08 PM By: Elpidio Eric [name], RN Signed: 08/01/2015 3:24:44 PM By: Madelaine Bhat MD Entered By: Elpidio Eric on 08/01/2015 14:50:07 [name] ([id number]) -------------------------------------------------------------------------------- Problem List Details Patient Name: [name], [name] Date of Service: 08/01/2015 2:15 PM Medical Record Number: [id number] Patient Account Number: [id number] Date of Birth/Sex: [birth year] (81 y.o. Male) Treating RN: Clover Mealy, RN, [name], [place] Sink Primary Care Physician: Guerry Bruin Other Clinician: Referring Physician: Guerry Bruin Treating Physician/Extender: BURNS III, Regis Bill in Treatment: 2 Active Problems ICD-10 Encounter Code Description Active Date Diagnosis I83.222 Varicose veins of left lower extremity with both ulcer of 07/18/2015 Yes calf and inflammation I89.0 Lymphedema, not elsewhere classified 07/18/2015 Yes I87.2 Venous insufficiency (chronic) (peripheral) 07/18/2015 Yes L03.116 Cellulitis of left lower limb 07/18/2015 Yes I48.2 Chronic atrial fibrillation 07/18/2015 Yes Z79.01 Long term (current) use of anticoagulants 07/18/2015 Yes Inactive Problems Resolved Problems Electronic Signature(s) Signed: 08/01/2015 3:24:44 PM By: Madelaine Bhat MD Entered By: Madelaine Bhat on 08/01/2015 14:48:55 [name], [name] ([id number]) -------------------------------------------------------------------------------- Progress Note Details Patient Name: Donald Musca C. Date of Service: 08/01/2015 2:15 PM Medical Record Number: [id number] Patient Account Number: [id number] Date of Birth/Sex: 11/30/1928 (81 y.o. Male) Treating RN: Clover Mealy, RN, [name], [place] Sink Primary Care Physician: Guerry Bruin Other Clinician: Referring Physician: Guerry Bruin Treating Physician/Extender: BURNS III, Regis Bill in Treatment: 2 Subjective Chief Complaint Information obtained from Patient Left calf  and ankle ulcerations. History of Present Illness (HPI) Pleasant 81 year old with history of chronic venous insufficiency, remote history of DVT, atrial fibrillation (on Coumadin). No history of diabetes. He says that he developed worsening left lower extremity swelling, blisters, and subsequent ulcerations in late October 2016. He was started on Augmentin for left lower extremity cellulitis, which has improved. Arterial ultrasound 07/27/2015 showed no significant peripheral arterial disease. Awaiting venous ultrasound. Performing dressing changes with silver alginate and using a Tubigrip for edema control. He denies any significant pain. No claudication or rest pain. Ambulating per his baseline with a walker. No fever or chills. Moderate drainage. Objective Constitutional Pulse regular. Respirations normal and unlabored. Afebrile. Vitals Time Taken: 2:29 PM, Height: 70 in, Weight: 144 lbs, BMI: 20.7, Temperature: 98.1 F, Pulse: 65 bpm, Respiratory Rate: 18 breaths/min, Blood Pressure: 106/41 mmHg. General Notes: Multiple left calf and left ankle ulcerations. Partial and full-thickness. Minimal residual cellulitis. 1+ pitting edema. No palpable pedal pulses. Dopplerable DP and PT. Integumentary (Hair, Skin) Wound #1 status is Healed - Epithelialized. Original cause of wound was Blister. The wound is located on the Left,Anterior Lower Leg. The wound measures 0cm length x 0cm width x 0cm depth; 0cm^2 area and 0cm^3 volume. The wound is limited to skin breakdown. There is no tunneling or undermining noted. There is a none present amount of drainage noted. The wound margin is indistinct and nonvisible. There is large Stigler, Jontay C. ([id number]) (67-100%) red granulation within the wound bed. There is no necrotic tissue within the wound bed.  The periwound skin appearance exhibited: Localized Edema, Dry/Scaly. The periwound skin appearance did not exhibit: Callus, Crepitus, Excoriation,  Fluctuance, Friable, Induration, Rash, Scarring, Maceration, Moist, Atrophie Blanche, Cyanosis, Ecchymosis, Hemosiderin Staining, Mottled, Pallor, Rubor, Erythema. Periwound temperature was noted as No Abnormality. Wound #2 status is Open. Original cause of wound was Blister. The wound is located on the Left,Medial Lower Leg. The wound measures 2cm length x 3cm width x 0.1cm depth; 4.712cm^2 area and 0.471cm^3 volume. The wound is limited to skin breakdown. There is no undermining noted. There is a small amount of serous drainage noted. The wound margin is flat and intact. There is small (1-33%) pink granulation within the wound bed. There is no necrotic tissue within the wound bed. The periwound skin appearance exhibited: Localized Edema, Dry/Scaly, Moist. The periwound skin appearance did not exhibit: Callus, Crepitus, Excoriation, Fluctuance, Friable, Induration, Rash, Scarring, Maceration, Atrophie Blanche, Cyanosis, Ecchymosis, Hemosiderin Staining, Mottled, Pallor, Rubor, Erythema. The periwound has tenderness on palpation. Wound #3 status is Open. Original cause of wound was Gradually Appeared. The wound is located on the Left,Medial Malleolus. The wound measures 0.5cm length x 0.5cm width x 0.1cm depth; 0.196cm^2 area and 0.02cm^3 volume. The wound is limited to skin breakdown. There is no tunneling or undermining noted. There is a small amount of serous drainage noted. The wound margin is flat and intact. There is large (67- 100%) pink granulation within the wound bed. There is no necrotic tissue within the wound bed. The periwound skin appearance exhibited: Localized Edema, Dry/Scaly, Moist. The periwound skin appearance did not exhibit: Callus, Crepitus, Excoriation, Fluctuance, Friable, Induration, Rash, Scarring, Maceration, Atrophie Blanche, Cyanosis, Ecchymosis, Hemosiderin Staining, Mottled, Pallor, Rubor, Erythema. Periwound temperature was noted as No Abnormality. The periwound  has tenderness on palpation. Wound #4 status is Open. Original cause of wound was Trauma. The wound is located on the Left,Proximal,Anterior Lower Leg. The wound measures 4.5cm length x 2cm width x 0.1cm depth; 7.069cm^2 area and 0.707cm^3 volume. The wound is limited to skin breakdown. There is no tunneling or undermining noted. There is a small amount of serous drainage noted. The wound margin is flat and intact. There is large (67-100%) red granulation within the wound bed. There is no necrotic tissue within the wound bed. The periwound skin appearance exhibited: Localized Edema, Moist. The periwound skin appearance did not exhibit: Callus, Crepitus, Excoriation, Fluctuance, Friable, Induration, Rash, Scarring, Dry/Scaly, Maceration, Atrophie Blanche, Cyanosis, Ecchymosis, Hemosiderin Staining, Mottled, Pallor, Rubor, Erythema. Periwound temperature was noted as No Abnormality. The periwound has tenderness on palpation. 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 ([id number]) Z79.01 - Long term (current) use of anticoagulants Left calf and ankle ulcerations, improving. Procedures Wound #3 Wound #3 is a Venous Leg Ulcer located on the Left,Medial Malleolus . There was a Non-Viable Tissue Open Wound/Selective [phone]) debridement with total area of 0.25 sq cm performed by BURNS III, Melanie Crazier., MD. with the following instrument(s): Curette to remove Viable and Non-Viable tissue/material including Fibrin/Slough, Skin, and Subcutaneous after achieving pain control using Lidocaine 4% Topical Solution. A time out was conducted prior to the start of the procedure. A Minimum amount of bleeding was controlled with  Pressure. The procedure was tolerated well with a pain level of 0 throughout and a pain level of 0  following the procedure. Post Debridement Measurements: 0.5cm length x 0.5cm width x 0.2cm depth; 0.039cm^3 volume. Post procedure Diagnosis Wound #3: Same as Pre-Procedure Wound #4 Wound #4 is a Skin Tear located on the Left,Proximal,Anterior Lower Leg . There was a Non-Viable Tissue Open Wound/Selective [phone]([id number]) debridement with total area of 9 sq cm performed by BURNS III, Melanie CrazierWALTER W., MD. with the following instrument(s): Curette to remove Non-Viable tissue/material including Skin and Subcutaneous after achieving pain control using Lidocaine 4% Topical Solution. A time out was conducted prior to the start of the procedure. A Minimum amount of bleeding was controlled with Pressure. The procedure was tolerated well with a pain level of 0 throughout and a pain level of 0 following the procedure. Post Debridement Measurements: 4.5cm length x 2cm width x 0.2cm depth; 1.414cm^3 volume. Post procedure Diagnosis Wound #4: Same as Pre-Procedure Plan Wound Cleansing: Wound #2 Left,Medial Lower Leg: Clean wound with Normal Saline. May Shower, gently pat wound dry prior to applying new dressing. Wound #3 Left,Medial Malleolus: Clean wound with Normal Saline. May Shower, gently pat wound dry prior to applying new dressing. Wound #4 Left,Proximal,Anterior Lower Leg: Clean wound with Normal Saline. [name], [name] ([id number]) May Shower, gently pat wound dry prior to applying new dressing. Anesthetic: Wound #2 Left,Medial Lower Leg: Topical Lidocaine 4% cream applied to wound bed prior to debridement Wound #3 Left,Medial Malleolus: Topical Lidocaine 4% cream applied to wound bed prior to debridement Wound #4 Left,Proximal,Anterior Lower Leg: Topical Lidocaine 4% cream applied to wound bed prior to debridement Primary Wound Dressing: Wound #2 Left,Medial Lower Leg: Aquacel Ag Wound #3  Left,Medial Malleolus: Aquacel Ag Wound #4 Left,Proximal,Anterior Lower Leg: Aquacel Ag Secondary Dressing: Wound #2 Left,Medial Lower Leg: ABD and Kerlix/Conform Wound #3 Left,Medial Malleolus: Boardered Foam Dressing Wound #4 Left,Proximal,Anterior Lower Leg: ABD and Kerlix/Conform Dressing Change Frequency: Wound #2 Left,Medial Lower Leg: Change dressing every other day. Wound #3 Left,Medial Malleolus: Change dressing every other day. Wound #4 Left,Proximal,Anterior Lower Leg: Change dressing every other day. Follow-up Appointments: Wound #2 Left,Medial Lower Leg: Return Appointment in 1 week. Wound #3 Left,Medial Malleolus: Return Appointment in 1 week. Wound #4 Left,Proximal,Anterior Lower Leg: Return Appointment in 1 week. Edema Control: Wound #2 Left,Medial Lower Leg: Elevate legs to the level of the heart and pump ankles as often as possible Tubigrip Wound #3 Left,Medial Malleolus: Elevate legs to the level of the heart and pump ankles as often as possible Tubigrip Wound #4 Left,Proximal,Anterior Lower Leg: Elevate legs to the level of the heart and pump ankles as often as possible Tubigrip Additional Orders / Instructions: Wound #2 Left,Medial Lower Leg: Increase protein intake. Wound #3 Left,Medial Malleolus: Increase protein intake. Donald MuscaWILLIFORD, Manny Marland Kitchen. ([id number]) Wound #4 Left,Proximal,Anterior Lower Leg: Increase protein intake. Arterial ultrasound results reviewed with patient. Awaiting venous ultrasound. Continue with silver alginate and Tubigrip. Electronic Signature(s) Signed: 08/01/2015 3:24:44 PM By: Madelaine BhatBurns, III, Mirtie Bastyr MD Entered By: Madelaine BhatBurns, III, Ilsa Bonello on 08/01/2015 14:52:07 [name], Deloy C. ([id number]) -------------------------------------------------------------------------------- SuperBill Details Patient Name: [name], [name] Date of Service: 08/01/2015 Medical Record Number: [id number] Patient Account Number: [id number] Date of  Birth/Sex: 05/01/1929 [age] y.o. 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 calf and inflammation I89.0 Lymphedema, not elsewhere classified I87.2 Venous insufficiency (chronic) (peripheral) L03.116 Cellulitis of left lower limb I48.2  Chronic atrial fibrillation Z79.01 Long term (current) use of anticoagulants Facility Procedures CPT4: Description Modifier Quantity Code [id number] - DEBRIDE WOUND 1ST 20 SQ CM OR < 1 ICD-10 Description Diagnosis I83.222 Varicose 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

## 2015-08-08 ENCOUNTER — Ambulatory Visit: Payer: No Typology Code available for payment source | Admitting: Surgery

## 2015-08-15 ENCOUNTER — Encounter: Payer: Medicare Other | Attending: Surgery | Admitting: Surgery

## 2015-08-15 DIAGNOSIS — I890 Lymphedema, not elsewhere classified: Secondary | ICD-10-CM | POA: Insufficient documentation

## 2015-08-15 DIAGNOSIS — L97221 Non-pressure chronic ulcer of left calf limited to breakdown of skin: Secondary | ICD-10-CM | POA: Diagnosis not present

## 2015-08-15 DIAGNOSIS — I872 Venous insufficiency (chronic) (peripheral): Secondary | ICD-10-CM | POA: Diagnosis not present

## 2015-08-15 DIAGNOSIS — L97321 Non-pressure chronic ulcer of left ankle limited to breakdown of skin: Secondary | ICD-10-CM | POA: Diagnosis not present

## 2015-08-15 DIAGNOSIS — Z7901 Long term (current) use of anticoagulants: Secondary | ICD-10-CM | POA: Diagnosis not present

## 2015-08-15 DIAGNOSIS — M1990 Unspecified osteoarthritis, unspecified site: Secondary | ICD-10-CM | POA: Diagnosis not present

## 2015-08-15 DIAGNOSIS — I83222 Varicose veins of left lower extremity with both ulcer of calf and inflammation: Secondary | ICD-10-CM | POA: Diagnosis not present

## 2015-08-15 DIAGNOSIS — Z950 Presence of cardiac pacemaker: Secondary | ICD-10-CM | POA: Insufficient documentation

## 2015-08-15 DIAGNOSIS — I482 Chronic atrial fibrillation: Secondary | ICD-10-CM | POA: Insufficient documentation

## 2015-08-16 NOTE — Progress Notes (Signed)
[name] ([id number]) Visit Report for 08/15/2015 Arrival Information Details Patient Name: [name], [name]. Date of Service: 08/15/2015 2:00 PM Medical Record Number: [id number] Patient Account Number: [id number] Date of Birth/Sex: 1929/07/08 (81 y.o. Male) Treating RN: Phillis Haggis Primary Care Physician: Guerry Bruin Other Clinician: Referring Physician: Guerry Bruin Treating Physician/Extender: BURNS III, Regis Bill in Treatment: 4 Visit Information History Since Last Visit All ordered tests and consults were completed: No Patient Arrived: Wheel Chair Added or deleted any medications: No Arrival Time: 14:13 Any new allergies or adverse reactions: No Accompanied By: wife Had a fall or experienced change in No Transfer Assistance: EasyPivot Patient activities of daily living that may affect Lift risk of falls: Patient Identification Verified: Yes Signs or symptoms of abuse/neglect since last No Secondary Verification Process Yes visito Completed: Hospitalized since last visit: No Patient Requires Transmission- No Pain Present Now: No Based Precautions: Patient Has Alerts: Yes Patient Alerts: Patient on Blood Thinner warfarin NOT Diabetic Electronic Signature(s) Signed: 08/15/2015 4:30:52 PM By: Alejandro Mulling Entered By: Alejandro Mulling on 08/15/2015 14:14:14 [name] ([id number]) -------------------------------------------------------------------------------- Encounter Discharge Information Details Patient Name: Donald Musca C. Date of Service: 08/15/2015 2:00 PM Medical Record Number: [id number] Patient Account Number: [id number] Date of Birth/Sex: 10/19/1928 (81 y.o. Male) Treating RN: Phillis Haggis Primary Care Physician: Guerry Bruin Other Clinician: Referring Physician: Guerry Bruin Treating Physician/Extender: BURNS III, Regis Bill in Treatment: 4 Encounter Discharge Information Items Discharge Pain Level:  0 Discharge Condition: Stable Ambulatory Status: Wheelchair Discharge Destination: Home Private Transportation: Auto Accompanied By: wife Schedule Follow-up Appointment: Yes Medication Reconciliation completed and Yes provided to Patient/Care Bryant Lipps: Clinical Summary of Care: Electronic Signature(s) Signed: 08/15/2015 4:30:52 PM By: Alejandro Mulling Entered By: Alejandro Mulling on 08/15/2015 14:55:01 [name] ([id number]) -------------------------------------------------------------------------------- Lower Extremity Assessment Details Patient Name: [name]. Date of Service: 08/15/2015 2:00 PM Medical Record Number: [id number] Patient Account Number: [id number] Date of Birth/Sex: [birth year] (81 y.o. Male) Treating RN: Phillis Haggis Primary Care Physician: Guerry Bruin Other Clinician: Referring Physician: Guerry Bruin Treating Physician/Extender: BURNS III, WALTER Weeks in Treatment: 4 Edema Assessment Assessed: [Left: No] [Right: No] E[Left: dema] [Right: :] Calf Left: Right: Point of Measurement: cm From Medial Instep 31.4 cm cm Ankle Left: Right: Point of Measurement: cm From Medial Instep 21.5 cm cm Vascular Assessment Pulses: Posterior Tibial Dorsalis Pedis Palpable: [Left:Yes] Extremity colors, hair growth, and conditions: Extremity Color: [Left:Mottled] Hair Growth on Extremity: [Left:No] Temperature of Extremity: [Left:Warm] Capillary Refill: [Left:< 3 seconds] Toe Nail Assessment Left: Right: Thick: Yes Discolored: Yes Deformed: Yes Improper Length and Hygiene: Yes Electronic Signature(s) Signed: 08/15/2015 4:30:52 PM By: Alejandro Mulling Entered By: Alejandro Mulling on 08/15/2015 14:22:56 Chamorro, [name] ([id number]) -------------------------------------------------------------------------------- Multi Wound Chart Details Patient Name: Donald Musca C. Date of Service: 08/15/2015 2:00 PM Medical Record Number:  [id number] Patient Account Number: [id number] Date of Birth/Sex: November 21, 1928 (81 y.o. Male) Treating RN: Phillis Haggis Primary Care Physician: Guerry Bruin Other Clinician: Referring Physician: Guerry Bruin Treating Physician/Extender: BURNS III, WALTER Weeks in Treatment: 4 Vital Signs Height(in): 70 Pulse(bpm): 63 Weight(lbs): 144 Blood Pressure 130/48 (mmHg): Body Mass Index(BMI): 21 Temperature(F): 98.1 Respiratory Rate 18 (breaths/min): Photos: [2:No Photos] [3:No Photos] [4:No Photos] Wound Location: [2:Left Lower Leg - Medial] [3:Left Malleolus - Medial] [4:Left Lower Leg - Anterior, Proximal] Wounding Event: [2:Blister] [3:Gradually Appeared] [4:Trauma] Primary Etiology: [2:Venous Leg Ulcer] [3:Venous Leg Ulcer] [4:Skin Tear] Comorbid History: [2:Cataracts, Arrhythmia, Deep Vein Thrombosis, Osteoarthritis] [3:Cataracts, Arrhythmia, Deep Vein Thrombosis, Osteoarthritis] [4:Cataracts, Arrhythmia, Deep Vein Thrombosis,  Osteoarthritis] Date Acquired: [2:07/04/2015] [3:05/16/2015] [4:07/23/2015] Weeks of Treatment: [2:4] [3:4] [4:3] Wound Status: [2:Open] [3:Open] [4:Open] Measurements L x W x D 2.5x1.2x0.1 [3:0.5x0.5x0.1] [4:2x3x0.1] (cm) Area (cm) : [2:2.356] [3:0.196] [4:4.712] Volume (cm) : [2:0.236] [3:0.02] [4:0.471] % Reduction in Area: [2:37.50%] [3:-108.50%] [4:-188.40%] % Reduction in Volume: 37.40% [3:-122.20%] [4:-189.00%] Classification: [2:Full Thickness Without Exposed Support Structures] [3:Partial Thickness] [4:Partial Thickness] Exudate Amount: [2:Small] [3:Small] [4:Medium] Exudate Type: [2:Serous] [3:Serous] [4:Serous] Exudate Color: [2:amber] [3:amber] [4:amber] Wound Margin: [2:Flat and Intact] [3:Flat and Intact] [4:Flat and Intact] Granulation Amount: [2:None Present (0%)] [3:Large (67-100%)] [4:Large (67-100%)] Granulation Quality: [2:N/A] [3:Pink] [4:Red] Necrotic Amount: [2:Large (67-100%)] [3:None Present (0%)] [4:Small  (1-33%)] Exposed Structures: [2:Fascia: No Fat: No Tendon: No] [3:Fascia: No Fat: No Tendon: No] [4:Fascia: No Fat: No Tendon: No] Muscle: No Muscle: No Muscle: No Joint: No Joint: No Joint: No Bone: No Bone: No Bone: No Limited to Skin Limited to Skin Limited to Skin Breakdown Breakdown Breakdown Epithelialization: None None None Periwound Skin Texture: Edema: Yes Edema: Yes Edema: Yes Excoriation: No Excoriation: No Excoriation: No Induration: No Induration: No Induration: No Callus: No Callus: No Callus: No Crepitus: No Crepitus: No Crepitus: No Fluctuance: No Fluctuance: No Fluctuance: No Friable: No Friable: No Friable: No Rash: No Rash: No Rash: No Scarring: No Scarring: No Scarring: No Periwound Skin Moist: Yes Moist: Yes Moist: Yes Moisture: Dry/Scaly: Yes Dry/Scaly: Yes Maceration: No Maceration: No Maceration: No Dry/Scaly: No Periwound Skin Color: Atrophie Blanche: No Atrophie Blanche: No Atrophie Blanche: No Cyanosis: No Cyanosis: No Cyanosis: No Ecchymosis: No Ecchymosis: No Ecchymosis: No Erythema: No Erythema: No Erythema: No Hemosiderin Staining: No Hemosiderin Staining: No Hemosiderin Staining: No Mottled: No Mottled: No Mottled: No Pallor: No Pallor: No Pallor: No Rubor: No Rubor: No Rubor: No Temperature: N/A No Abnormality No Abnormality Tenderness on Yes Yes Yes Palpation: Wound Preparation: Ulcer Cleansing: Ulcer Cleansing: Ulcer Cleansing: Rinsed/Irrigated with Rinsed/Irrigated with Rinsed/Irrigated with Saline Saline Saline Topical Anesthetic Topical Anesthetic Topical Anesthetic Applied: Other: lidociane Applied: Other: lidocaine Applied: None, Other 4% 4% Treatment Notes Electronic Signature(s) Signed: 08/15/2015 4:30:52 PM By: Alejandro MullingPinkerton, Debra Entered By: Alejandro MullingPinkerton, Debra on 08/15/2015 14:31:12 Donald MouseWILLIFORD, Cylas C.  ([id number]) -------------------------------------------------------------------------------- Multi-Disciplinary Care Plan Details Patient Name: Donald MouseWILLIFORD, Cayson C. Date of Service: 08/15/2015 2:00 PM Medical Record Number: [id number] Patient Account Number: [id number] Date of Birth/Sex: 05/07/1929 16(81 y.o. Male) Treating RN: Phillis HaggisPinkerton, Debi Primary Care Physician: Guerry BruinISOVEC, RICHARD Other Clinician: Referring Physician: Guerry BruinISOVEC, RICHARD Treating Physician/Extender: BURNS III, Regis BillWALTER Weeks in Treatment: 4 Active Inactive Orientation to the Wound Care Program Nursing Diagnoses: Knowledge deficit related to the wound healing center program Goals: Patient/caregiver will verbalize understanding of the Wound Healing Center Program Date Initiated: 07/18/2015 Goal Status: Active Interventions: Provide education on orientation to the wound center Notes: Venous Leg Ulcer Nursing Diagnoses: Actual venous Insuffiency (use after diagnosis is confirmed) Goals: Non-invasive venous studies are completed as ordered Date Initiated: 07/18/2015 Goal Status: Active Interventions: Assess peripheral edema status every visit. Treatment Activities: Non-invasive vascular studies : 08/15/2015 Notes: Wound/Skin Impairment Nursing Diagnoses: Knowledge deficit related to ulceration/compromised skin integrity Ganger, Jayan C. ([id number]) Goals: Ulcer/skin breakdown will heal within 14 weeks Date Initiated: 07/18/2015 Goal Status: Active Interventions: Assess patient/caregiver ability to obtain necessary supplies Notes: Electronic Signature(s) Signed: 08/15/2015 4:30:52 PM By: Alejandro MullingPinkerton, Debra Entered By: Alejandro MullingPinkerton, Debra on 08/15/2015 14:30:28 Donald MouseWILLIFORD, Rain C. ([id number]) -------------------------------------------------------------------------------- Pain Assessment Details Patient Name: Donald MuscaWILLIFORD, Doroteo C. Date of Service: 08/15/2015 2:00 PM Medical Record Number: [id number] Patient Account  Number: [id number] Date of Birth/Sex:  [birth year] (81 y.o. Male) Treating RN: Phillis Haggis Primary Care Physician: Guerry Bruin Other Clinician: Referring Physician: Guerry Bruin Treating Physician/Extender: BURNS III, WALTER Weeks in Treatment: 4 Active Problems Location of Pain Severity and Description of Pain Patient Has Paino No Site Locations Pain Management and Medication Current Pain Management: Electronic Signature(s) Signed: 08/15/2015 4:30:52 PM By: Alejandro Mulling Entered By: Alejandro Mulling on 08/15/2015 14:14:21 [name] ([id number]) -------------------------------------------------------------------------------- Patient/Caregiver Education Details Patient Name: [name]. Date of Service: 08/15/2015 2:00 PM Medical Record Number: [id number] Patient Account Number: [id number] Date of Birth/Gender: 1929/05/26 (81 y.o. Male) Treating RN: Phillis Haggis Primary Care Physician: Guerry Bruin Other Clinician: Referring Physician: Guerry Bruin Treating Physician/Extender: BURNS III, Regis Bill in Treatment: 4 Education Assessment Education Provided To: Patient and Caregiver Education Topics Provided Wound/Skin Impairment: Handouts: Other: change dressing as ordered Methods: Demonstration, Explain/Verbal Responses: State content correctly Electronic Signature(s) Signed: 08/15/2015 4:30:52 PM By: Alejandro Mulling Entered By: Alejandro Mulling on 08/15/2015 14:55:20 Hentz, [name] ([id number]) -------------------------------------------------------------------------------- Wound Assessment Details Patient Name: Donald Musca C. Date of Service: 08/15/2015 2:00 PM Medical Record Number: [id number] Patient Account Number: [id number] Date of Birth/Sex: 01/08/1929 (81 y.o. Male) Treating RN: Phillis Haggis Primary Care Physician: Guerry Bruin Other Clinician: Referring Physician: Guerry Bruin Treating  Physician/Extender: BURNS III, WALTER Weeks in Treatment: 4 Wound Status Wound Number: 2 Primary Venous Leg Ulcer Etiology: Wound Location: Left Lower Leg - Medial Wound Open Wounding Event: Blister Status: Date Acquired: 07/04/2015 Comorbid Cataracts, Arrhythmia, Deep Vein Weeks Of Treatment: 4 History: Thrombosis, Osteoarthritis Clustered Wound: No Photos Photo Uploaded By: Alejandro Mulling on 08/15/2015 16:01:45 Wound Measurements Length: (cm) 2.5 Width: (cm) 1.2 Depth: (cm) 0.1 Area: (cm) 2.356 Volume: (cm) 0.236 % Reduction in Area: 37.5% % Reduction in Volume: 37.4% Epithelialization: None Tunneling: No Undermining: No Wound Description Full Thickness Without Exposed Classification: Support Structures Wound Margin: Flat and Intact Exudate Small Amount: Exudate Type: Serous Exudate Color: amber Foul Odor After Cleansing: No Wound Bed Granulation Amount: None Present (0%) Exposed Structure Necrotic Amount: Large (67-100%) Fascia Exposed: No Necrotic Quality: Adherent Slough Fat Layer Exposed: No Cosgriff, Jaylend C. ([id number]) Tendon Exposed: No Muscle Exposed: No Joint Exposed: No Bone Exposed: No Limited to Skin Breakdown Periwound Skin Texture Texture Color No Abnormalities Noted: No No Abnormalities Noted: No Callus: No Atrophie Blanche: No Crepitus: No Cyanosis: No Excoriation: No Ecchymosis: No Fluctuance: No Erythema: No Friable: No Hemosiderin Staining: No Induration: No Mottled: No Localized Edema: Yes Pallor: No Rash: No Rubor: No Scarring: No Temperature / Pain Moisture Tenderness on Palpation: Yes No Abnormalities Noted: No Dry / Scaly: Yes Maceration: No Moist: Yes Wound Preparation Ulcer Cleansing: Rinsed/Irrigated with Saline Topical Anesthetic Applied: Other: lidociane 4%, Treatment Notes Wound #2 (Left, Medial Lower Leg) 1. Cleansed with: Clean wound with Normal Saline 2. Anesthetic Topical Lidocaine 4%  cream to wound bed prior to debridement 4. Dressing Applied: Aquacel Ag 5. Secondary Dressing Applied Bordered Foam Dressing ABD and Kerlix/Conform 7. Secured with Tape Tubigrip Electronic Signature(s) Signed: 08/15/2015 4:30:52 PM By: Alejandro Mulling Entered By: Alejandro Mulling on 08/15/2015 14:32:37 [name] ([id number]) Jeanie Sewer, [name] ([id number]) -------------------------------------------------------------------------------- Wound Assessment Details Patient Name: Donald Musca C. Date of Service: 08/15/2015 2:00 PM Medical Record Number: [id number] Patient Account Number: [id number] Date of Birth/Sex: November 07, 1928 (81 y.o. Male) Treating RN: Phillis Haggis Primary Care Physician: Guerry Bruin Other Clinician: Referring Physician: Guerry Bruin Treating Physician/Extender: BURNS III, WALTER Weeks in Treatment: 4 Wound Status Wound Number: 3 Primary Venous Leg Ulcer  Etiology: Wound Location: Left Malleolus - Medial Wound Open Wounding Event: Gradually Appeared Status: Date Acquired: 05/16/2015 Comorbid Cataracts, Arrhythmia, Deep Vein Weeks Of Treatment: 4 History: Thrombosis, Osteoarthritis Clustered Wound: No Photos Photo Uploaded By: Alejandro Mulling on 08/15/2015 16:00:59 Wound Measurements Length: (cm) 0.5 Width: (cm) 0.5 Depth: (cm) 0.1 Area: (cm) 0.196 Volume: (cm) 0.02 % Reduction in Area: -108.5% % Reduction in Volume: -122.2% Epithelialization: None Tunneling: No Undermining: No Wound Description Classification: Partial Thickness Wound Margin: Flat and Intact Exudate Amount: Small Exudate Type: Serous Exudate Color: amber Wound Bed Granulation Amount: Large (67-100%) Exposed Structure Granulation Quality: Pink Fascia Exposed: No Necrotic Amount: None Present (0%) Fat Layer Exposed: No Tendon Exposed: No Inzunza, Nashid C. ([id number]) Muscle Exposed: No Joint Exposed: No Bone Exposed: No Limited to Skin  Breakdown Periwound Skin Texture Texture Color No Abnormalities Noted: No No Abnormalities Noted: No Callus: No Atrophie Blanche: No Crepitus: No Cyanosis: No Excoriation: No Ecchymosis: No Fluctuance: No Erythema: No Friable: No Hemosiderin Staining: No Induration: No Mottled: No Localized Edema: Yes Pallor: No Rash: No Rubor: No Scarring: No Temperature / Pain Moisture Temperature: No Abnormality No Abnormalities Noted: No Tenderness on Palpation: Yes Dry / Scaly: Yes Maceration: No Moist: Yes Wound Preparation Ulcer Cleansing: Rinsed/Irrigated with Saline Topical Anesthetic Applied: Other: lidocaine 4%, Treatment Notes Wound #3 (Left, Medial Malleolus) 1. Cleansed with: Clean wound with Normal Saline 2. Anesthetic Topical Lidocaine 4% cream to wound bed prior to debridement 4. Dressing Applied: Aquacel Ag 5. Secondary Dressing Applied Bordered Foam Dressing ABD and Kerlix/Conform 7. Secured with Tape Tubigrip Electronic Signature(s) Signed: 08/15/2015 4:30:52 PM By: Alejandro Mulling Entered By: Alejandro Mulling on 08/15/2015 14:32:46 [name] ([id number]) Jeanie Sewer, [name] ([id number]) -------------------------------------------------------------------------------- Wound Assessment Details Patient Name: Donald Musca C. Date of Service: 08/15/2015 2:00 PM Medical Record Number: [id number] Patient Account Number: [id number] Date of Birth/Sex: 1929/07/24 (81 y.o. Male) Treating RN: Phillis Haggis Primary Care Physician: Guerry Bruin Other Clinician: Referring Physician: Guerry Bruin Treating Physician/Extender: BURNS III, WALTER Weeks in Treatment: 4 Wound Status Wound Number: 4 Primary Skin Tear Etiology: Wound Location: Left Lower Leg - Anterior, Proximal Wound Open Status: Wounding Event: Trauma Comorbid Cataracts, Arrhythmia, Deep Vein Date Acquired: 07/23/2015 History: Thrombosis, Osteoarthritis Weeks Of Treatment:  3 Clustered Wound: No Photos Photo Uploaded By: Alejandro Mulling on 08/15/2015 16:00:59 Wound Measurements Length: (cm) 2 Width: (cm) 3 Depth: (cm) 0.1 Area: (cm) 4.712 Volume: (cm) 0.471 % Reduction in Area: -188.4% % Reduction in Volume: -189% Epithelialization: None Tunneling: No Undermining: No Wound Description Classification: Partial Thickness Wound Margin: Flat and Intact Exudate Amount: Medium Exudate Type: Serous Exudate Color: amber Foul Odor After Cleansing: No Wound Bed Granulation Amount: Large (67-100%) Exposed Structure Granulation Quality: Red Fascia Exposed: No Necrotic Amount: Small (1-33%) Fat Layer Exposed: No Guarisco, Deejay C. ([id number]) Necrotic Quality: Adherent Slough Tendon Exposed: No Muscle Exposed: No Joint Exposed: No Bone Exposed: No Limited to Skin Breakdown Periwound Skin Texture Texture Color No Abnormalities Noted: No No Abnormalities Noted: No Callus: No Atrophie Blanche: No Crepitus: No Cyanosis: No Excoriation: No Ecchymosis: No Fluctuance: No Erythema: No Friable: No Hemosiderin Staining: No Induration: No Mottled: No Localized Edema: Yes Pallor: No Rash: No Rubor: No Scarring: No Temperature / Pain Moisture Temperature: No Abnormality No Abnormalities Noted: No Tenderness on Palpation: Yes Dry / Scaly: No Maceration: No Moist: Yes Wound Preparation Ulcer Cleansing: Rinsed/Irrigated with Saline Topical Anesthetic Applied: Other: lidocaine 4%, Treatment Notes Wound #4 (Left, Proximal, Anterior Lower Leg) 1. Cleansed with:  Clean wound with Normal Saline 2. Anesthetic Topical Lidocaine 4% cream to wound bed prior to debridement 4. Dressing Applied: Aquacel Ag 5. Secondary Dressing Applied Bordered Foam Dressing ABD and Kerlix/Conform 7. Secured with Tape Tubigrip Electronic Signature(s) Signed: 08/15/2015 4:30:52 PM By: Alejandro Mulling Entered By: Alejandro Mulling on 08/15/2015  14:33:01 [name] ([id number]) Jeanie Sewer, [name] ([id number]) -------------------------------------------------------------------------------- Vitals Details Patient Name: Donald Musca C. Date of Service: 08/15/2015 2:00 PM Medical Record Number: [id number] Patient Account Number: [id number] Date of Birth/Sex: 06/07/1929 (81 y.o. Male) Treating RN: Phillis Haggis Primary Care Physician: Guerry Bruin Other Clinician: Referring Physician: Guerry Bruin Treating Physician/Extender: BURNS III, WALTER Weeks in Treatment: 4 Vital Signs Time Taken: 14:14 Temperature (F): 98.1 Height (in): 70 Pulse (bpm): 63 Weight (lbs): 144 Respiratory Rate (breaths/min): 18 Body Mass Index (BMI): 20.7 Blood Pressure (mmHg): 130/48 Reference Range: 80 - 120 mg / dl Electronic Signature(s) Signed: 08/15/2015 4:30:52 PM By: Alejandro Mulling Entered By: Alejandro Mulling on 08/15/2015 14:16:28

## 2015-08-16 NOTE — Progress Notes (Signed)
SUKHRAJ, ESQUIVIAS ([id number]) Visit Report for 08/15/2015 Chief Complaint Document Details Patient Name: [name], [name]. Date of Service: 08/15/2015 2:00 PM Medical Record Number: [id number] Patient Account Number: [id number] Date of Birth/Sex: 06/12/1929 (81 y.o. Male) Treating RN: Phillis Haggis Primary Care Physician: Guerry Bruin Other Clinician: Referring Physician: Guerry Bruin Treating Physician/Extender: BURNS III, Regis Bill in Treatment: 4 Information Obtained from: Patient Chief Complaint Left calf and ankle ulcerations. Electronic Signature(s) Signed: 08/15/2015 2:46:32 PM By: Madelaine Bhat MD Entered By: Madelaine Bhat on 08/15/2015 14:45:00 [name] ([id number]) -------------------------------------------------------------------------------- Debridement Details Patient Name: Donald Musca C. Date of Service: 08/15/2015 2:00 PM Medical Record Number: [id number] Patient Account Number: [id number] Date of Birth/Sex: [birth year] (81 y.o. Male) Treating RN: Phillis Haggis Primary Care Physician: Guerry Bruin Other Clinician: Referring Physician: Guerry Bruin Treating Physician/Extender: BURNS III, Regis Bill in Treatment: 4 Debridement Performed for Wound #2 Left,Medial Lower Leg Assessment: Performed By: Physician BURNS III, Melanie Crazier., MD Debridement: Debridement Pre-procedure Yes Verification/Time Out Taken: Start Time: 14:36 Pain Control: Other : lidocaine 4% Level: Skin/Subcutaneous Tissue Total Area Debrided (L x 2.5 (cm) x 1.2 (cm) = 3 (cm) W): Tissue and other Viable, Non-Viable, Exudate, Fat, Fibrin/Slough, Subcutaneous material debrided: Instrument: Curette Bleeding: None End Time: 14:37 Procedural Pain: 0 Post Procedural Pain: 0 Response to Treatment: Procedure was tolerated well Post Debridement Measurements of Total Wound Length: (cm) 2.5 Width: (cm) 1.2 Depth: (cm) 0.2 Volume: (cm) 0.471 Post  Procedure Diagnosis Same as Pre-procedure Electronic Signature(s) Signed: 08/15/2015 2:46:32 PM By: Madelaine Bhat MD Signed: 08/15/2015 4:30:52 PM By: Alejandro Mulling Entered By: Madelaine Bhat on 08/15/2015 14:44:53 [name], [name] ([id number]) -------------------------------------------------------------------------------- HPI Details Patient Name: Donald Musca C. Date of Service: 08/15/2015 2:00 PM Medical Record Number: [id number] Patient Account Number: [id number] Date of Birth/Sex: [birth year] (81 y.o. Male) Treating RN: Phillis Haggis Primary Care Physician: Guerry Bruin Other Clinician: Referring Physician: Guerry Bruin Treating Physician/Extender: BURNS III, Dock Baccam Weeks in Treatment: 4 History of Present Illness HPI Description: Pleasant 81 year old with history of chronic venous insufficiency, remote history of DVT, atrial fibrillation (on Coumadin). No history of diabetes. He says that he developed worsening left lower extremity swelling, blisters, and subsequent ulcerations in late October 2016. He was started on Augmentin for left lower extremity cellulitis, which has improved. Arterial ultrasound 07/27/2015 showed no significant peripheral arterial disease. Awaiting venous ultrasound. Performing dressing changes with silver alginate and using a Tubigrip for edema control. He denies any significant pain. No claudication or rest pain. Ambulating per his baseline with a walker. No fever or chills. Moderate drainage. Electronic Signature(s) Signed: 08/15/2015 2:46:32 PM By: Madelaine Bhat MD Entered By: Madelaine Bhat on 08/15/2015 14:45:10 [name] ([id number]) -------------------------------------------------------------------------------- Physical Exam Details Patient Name: Donald Musca C. Date of Service: 08/15/2015 2:00 PM Medical Record Number: [id number] Patient Account Number: [id number] Date of Birth/Sex: 06-Jul-1929 (81  y.o. Male) Treating RN: Phillis Haggis Primary Care Physician: Guerry Bruin Other Clinician: Referring Physician: Guerry Bruin Treating Physician/Extender: BURNS III, Maclin Guerrette Weeks in Treatment: 4 Constitutional . Pulse regular. Respirations normal and unlabored. Afebrile. . Notes Multiple left calf and left ankle ulcerations. Partial and full-thickness. Cellulitis resolved. 1+ pitting edema. No palpable pedal pulses. Dopplerable DP and PT. Electronic Signature(s) Signed: 08/15/2015 2:46:32 PM By: Madelaine Bhat MD Entered By: Madelaine Bhat on 08/15/2015 14:45:45 [name] ([id number]) -------------------------------------------------------------------------------- Physician Orders Details Patient Name: [name] Date of Service: 08/15/2015 2:00 PM Medical Record Number: [id number] Patient Account  Number: [id number] Date of Birth/Sex: 01/18/1929 [age] y.o. Male) Treating RN: Ashok CordiaPinkerton, Debi Primary Care Physician: Guerry BruinISOVEC, RICHARD Other Clinician: Referring Physician: Guerry BruinISOVEC, RICHARD Treating Physician/Extender: BURNS III, Regis BillWALTER Weeks in Treatment: 4 Verbal / Phone Orders: Yes Clinician: Pinkerton, Debi Read Back and Verified: Yes Diagnosis Coding Wound Cleansing Wound #2 Left,Medial Lower Leg o Clean wound with Normal Saline. o May Shower, gently pat wound dry prior to applying new dressing. Wound #3 Left,Medial Malleolus o Clean wound with Normal Saline. o May Shower, gently pat wound dry prior to applying new dressing. Wound #4 Left,Proximal,Anterior Lower Leg o Clean wound with Normal Saline. o May Shower, gently pat wound dry prior to applying new dressing. Anesthetic Wound #2 Left,Medial Lower Leg o Topical Lidocaine 4% cream applied to wound bed prior to debridement Wound #3 Left,Medial Malleolus o Topical Lidocaine 4% cream applied to wound bed prior to debridement Wound #4 Left,Proximal,Anterior Lower Leg o  Topical Lidocaine 4% cream applied to wound bed prior to debridement Primary Wound Dressing Wound #2 Left,Medial Lower Leg o Aquacel Ag Wound #3 Left,Medial Malleolus o Aquacel Ag Wound #4 Left,Proximal,Anterior Lower Leg o Aquacel Ag Secondary Dressing Wound #2 Left,Medial Lower Leg o ABD and Kerlix/Conform Briggs, Marcquis C. ([id number]) Wound #3 Left,Medial Malleolus o Boardered Foam Dressing Wound #4 Left,Proximal,Anterior Lower Leg o ABD and Kerlix/Conform Dressing Change Frequency Wound #2 Left,Medial Lower Leg o Change dressing every other day. Wound #3 Left,Medial Malleolus o Change dressing every other day. Wound #4 Left,Proximal,Anterior Lower Leg o Change dressing every other day. Follow-up Appointments Wound #2 Left,Medial Lower Leg o Return Appointment in 1 week. Wound #3 Left,Medial Malleolus o Return Appointment in 1 week. Wound #4 Left,Proximal,Anterior Lower Leg o Return Appointment in 1 week. Edema Control Wound #2 Left,Medial Lower Leg o Elevate legs to the level of the heart and pump ankles as often as possible o Tubigrip Wound #3 Left,Medial Malleolus o Elevate legs to the level of the heart and pump ankles as often as possible o Tubigrip Wound #4 Left,Proximal,Anterior Lower Leg o Elevate legs to the level of the heart and pump ankles as often as possible o Tubigrip Additional Orders / Instructions Wound #2 Left,Medial Lower Leg o Increase protein intake. Wound #3 Left,Medial Malleolus o Increase protein intake. Wound #4 Left,Proximal,Anterior Lower Leg [name], [name] ([id number]) o Increase protein intake. Electronic Signature(s) Signed: 08/15/2015 2:46:32 PM By: Madelaine BhatBurns, III, Osker Ayoub MD Signed: 08/15/2015 4:30:52 PM By: Alejandro MullingPinkerton, Debra Entered By: Alejandro MullingPinkerton, Debra on 08/15/2015 14:44:01 Donald MouseWILLIFORD, [name]  ([id number]) -------------------------------------------------------------------------------- Problem List Details Patient Name: Donald MuscaWILLIFORD, [name] Date of Service: 08/15/2015 2:00 PM Medical Record Number: [id number] Patient Account Number: [id number] Date of Birth/Sex: 12/15/1928 [age] y.o. Male) Treating RN: Phillis HaggisPinkerton, Debi Primary Care Physician: Guerry BruinISOVEC, RICHARD Other Clinician: Referring Physician: Guerry BruinISOVEC, RICHARD Treating Physician/Extender: BURNS III, Regis BillWALTER Weeks in Treatment: 4 Active Problems ICD-10 Encounter Code Description Active Date Diagnosis I83.222 Varicose veins of left lower extremity with both ulcer of 07/18/2015 Yes calf and inflammation I89.0 Lymphedema, not elsewhere classified 07/18/2015 Yes I87.2 Venous insufficiency (chronic) (peripheral) 07/18/2015 Yes I48.2 Chronic atrial fibrillation 07/18/2015 Yes Z79.01 Long term (current) use of anticoagulants 07/18/2015 Yes Inactive Problems Resolved Problems ICD-10 Code Description Active Date Resolved Date L03.116 Cellulitis of left lower limb 07/18/2015 07/18/2015 Electronic Signature(s) Signed: 08/15/2015 2:46:32 PM By: Madelaine BhatBurns, III, Dorella Laster MD Entered By: Madelaine BhatBurns, III, Archibald Marchetta on 08/15/2015 14:44:37 [name], Donald HowellsALPH C. ([id number]) -------------------------------------------------------------------------------- Progress Note Details Patient Name: Donald MuscaWILLIFORD, [name] Date of Service: 08/15/2015 2:00 PM Medical Record Number: [id number]  Patient Account Number: [id number] Date of Birth/Sex: 10/01/1928 (81 y.o. Male) Treating RN: Phillis Haggis Primary Care Physician: Guerry Bruin Other Clinician: Referring Physician: Guerry Bruin Treating Physician/Extender: BURNS III, Regis Bill in Treatment: 4 Subjective Chief Complaint Information obtained from Patient Left calf and ankle ulcerations. History of Present Illness (HPI) Pleasant 81 year old with history of chronic venous insufficiency, remote history of DVT,  atrial fibrillation (on Coumadin). No history of diabetes. He says that he developed worsening left lower extremity swelling, blisters, and subsequent ulcerations in late October 2016. He was started on Augmentin for left lower extremity cellulitis, which has improved. Arterial ultrasound 07/27/2015 showed no significant peripheral arterial disease. Awaiting venous ultrasound. Performing dressing changes with silver alginate and using a Tubigrip for edema control. He denies any significant pain. No claudication or rest pain. Ambulating per his baseline with a walker. No fever or chills. Moderate drainage. Objective Constitutional Pulse regular. Respirations normal and unlabored. Afebrile. Vitals Time Taken: 2:14 PM, Height: 70 in, Weight: 144 lbs, BMI: 20.7, Temperature: 98.1 F, Pulse: 63 bpm, Respiratory Rate: 18 breaths/min, Blood Pressure: 130/48 mmHg. General Notes: Multiple left calf and left ankle ulcerations. Partial and full-thickness. Cellulitis resolved. 1+ pitting edema. No palpable pedal pulses. Dopplerable DP and PT. Integumentary (Hair, Skin) Wound #2 status is Open. Original cause of wound was Blister. The wound is located on the Left,Medial Lower Leg. The wound measures 2.5cm length x 1.2cm width x 0.1cm depth; 2.356cm^2 area and 0.236cm^3 volume. The wound is limited to skin breakdown. There is no tunneling or undermining noted. There is a small amount of serous drainage noted. The wound margin is flat and intact. There is no Mormile, Mycah C. ([id number]) granulation within the wound bed. There is a large (67-100%) amount of necrotic tissue within the wound bed including Adherent Slough. The periwound skin appearance exhibited: Localized Edema, Dry/Scaly, Moist. The periwound skin appearance did not exhibit: Callus, Crepitus, Excoriation, Fluctuance, Friable, Induration, Rash, Scarring, Maceration, Atrophie Blanche, Cyanosis, Ecchymosis, Hemosiderin Staining, Mottled,  Pallor, Rubor, Erythema. The periwound has tenderness on palpation. Wound #3 status is Open. Original cause of wound was Gradually Appeared. The wound is located on the Left,Medial Malleolus. The wound measures 0.5cm length x 0.5cm width x 0.1cm depth; 0.196cm^2 area and 0.02cm^3 volume. The wound is limited to skin breakdown. There is no tunneling or undermining noted. There is a small amount of serous drainage noted. The wound margin is flat and intact. There is large (67- 100%) pink granulation within the wound bed. There is no necrotic tissue within the wound bed. The periwound skin appearance exhibited: Localized Edema, Dry/Scaly, Moist. The periwound skin appearance did not exhibit: Callus, Crepitus, Excoriation, Fluctuance, Friable, Induration, Rash, Scarring, Maceration, Atrophie Blanche, Cyanosis, Ecchymosis, Hemosiderin Staining, Mottled, Pallor, Rubor, Erythema. Periwound temperature was noted as No Abnormality. The periwound has tenderness on palpation. Wound #4 status is Open. Original cause of wound was Trauma. The wound is located on the Left,Proximal,Anterior Lower Leg. The wound measures 2cm length x 3cm width x 0.1cm depth; 4.712cm^2 area and 0.471cm^3 volume. The wound is limited to skin breakdown. There is no tunneling or undermining noted. There is a medium amount of serous drainage noted. The wound margin is flat and intact. There is large (67-100%) red granulation within the wound bed. There is a small (1-33%) amount of necrotic tissue within the wound bed including Adherent Slough. The periwound skin appearance exhibited: Localized Edema, Moist. The periwound skin appearance did not exhibit: Callus, Crepitus, Excoriation, Fluctuance, Friable, Induration, Rash,  Scarring, Dry/Scaly, Maceration, Atrophie Blanche, Cyanosis, Ecchymosis, Hemosiderin Staining, Mottled, Pallor, Rubor, Erythema. Periwound temperature was noted as No Abnormality. The periwound has tenderness on  palpation. Assessment Active Problems ICD-10 I83.222 - Varicose veins of left lower extremity with both ulcer of calf and inflammation I89.0 - Lymphedema, not elsewhere classified I87.2 - Venous insufficiency (chronic) (peripheral) I48.2 - Chronic atrial fibrillation Z79.01 - Long term (current) use of anticoagulants Left calf and ankle ulcerations. Procedures [name], [name] ([id number]) Wound #2 Wound #2 is a Venous Leg Ulcer located on the Left,Medial Lower Leg . There was a Skin/Subcutaneous Tissue Debridement (40981-19147) debridement with total area of 3 sq cm performed by BURNS III, Melanie Crazier., MD. with the following instrument(s): Curette to remove Viable and Non-Viable tissue/material including Exudate, Fat, Fibrin/Slough, and Subcutaneous after achieving pain control using Other (lidocaine 4%). A time out was conducted prior to the start of the procedure. There was no bleeding. The procedure was tolerated well with a pain level of 0 throughout and a pain level of 0 following the procedure. Post Debridement Measurements: 2.5cm length x 1.2cm width x 0.2cm depth; 0.471cm^3 volume. Post procedure Diagnosis Wound #2: Same as Pre-Procedure Plan Wound Cleansing: Wound #2 Left,Medial Lower Leg: Clean wound with Normal Saline. May Shower, gently pat wound dry prior to applying new dressing. Wound #3 Left,Medial Malleolus: Clean wound with Normal Saline. May Shower, gently pat wound dry prior to applying new dressing. Wound #4 Left,Proximal,Anterior Lower Leg: Clean wound with Normal Saline. May Shower, gently pat wound dry prior to applying new dressing. Anesthetic: Wound #2 Left,Medial Lower Leg: Topical Lidocaine 4% cream applied to wound bed prior to debridement Wound #3 Left,Medial Malleolus: Topical Lidocaine 4% cream applied to wound bed prior to debridement Wound #4 Left,Proximal,Anterior Lower Leg: Topical Lidocaine 4% cream applied to wound bed prior to  debridement Primary Wound Dressing: Wound #2 Left,Medial Lower Leg: Aquacel Ag Wound #3 Left,Medial Malleolus: Aquacel Ag Wound #4 Left,Proximal,Anterior Lower Leg: Aquacel Ag Secondary Dressing: Wound #2 Left,Medial Lower Leg: ABD and Kerlix/Conform Wound #3 Left,Medial Malleolus: Boardered Foam Dressing Wound #4 Left,Proximal,Anterior Lower Leg: ABD and Kerlix/Conform Dressing Change Frequency: [name], [name] ([id number]) Wound #2 Left,Medial Lower Leg: Change dressing every other day. Wound #3 Left,Medial Malleolus: Change dressing every other day. Wound #4 Left,Proximal,Anterior Lower Leg: Change dressing every other day. Follow-up Appointments: Wound #2 Left,Medial Lower Leg: Return Appointment in 1 week. Wound #3 Left,Medial Malleolus: Return Appointment in 1 week. Wound #4 Left,Proximal,Anterior Lower Leg: Return Appointment in 1 week. Edema Control: Wound #2 Left,Medial Lower Leg: Elevate legs to the level of the heart and pump ankles as often as possible Tubigrip Wound #3 Left,Medial Malleolus: Elevate legs to the level of the heart and pump ankles as often as possible Tubigrip Wound #4 Left,Proximal,Anterior Lower Leg: Elevate legs to the level of the heart and pump ankles as often as possible Tubigrip Additional Orders / Instructions: Wound #2 Left,Medial Lower Leg: Increase protein intake. Wound #3 Left,Medial Malleolus: Increase protein intake. Wound #4 Left,Proximal,Anterior Lower Leg: Increase protein intake. silver alginate. Tubigrip to knee. Electronic Signature(s) Signed: 08/15/2015 2:46:32 PM By: Madelaine Bhat MD Entered By: Madelaine Bhat on 08/15/2015 14:46:05 [name] ([id number]) -------------------------------------------------------------------------------- SuperBill Details Patient Name: Donald Musca C. Date of Service: 08/15/2015 Medical Record Number: [id number] Patient Account Number: [id number] Date of  Birth/Sex: [birth year] (81 y.o. Male) Treating RN: Phillis Haggis Primary Care Physician: Guerry Bruin Other Clinician: Referring Physician: Guerry Bruin Treating Physician/Extender: BURNS III, Onesimo Lingard Weeks in  Treatment: 4 Diagnosis Coding ICD-10 Codes Code Description I83.222 Varicose veins of left lower extremity with both ulcer of calf and inflammation I89.0 Lymphedema, not elsewhere classified I87.2 Venous insufficiency (chronic) (peripheral) I48.2 Chronic atrial fibrillation Z79.01 Long term (current) use of anticoagulants Facility Procedures CPT4: Description Modifier Quantity Code [id number] 11042 - DEB SUBQ TISSUE 20 SQ CM/< 1 ICD-10 Description Diagnosis I83.222 Varicose veins of left lower extremity with both ulcer of calf and inflammation Physician Procedures CPT4: Description Modifier Quantity Code [id number] 11042 - WC PHYS SUBQ TISS 20 SQ CM 1 ICD-10 Description Diagnosis I83.222 Varicose veins of left lower extremity with both ulcer of calf and inflammation Electronic Signature(s) Signed: 08/15/2015 2:46:32 PM By: Madelaine Bhat MD Entered By: Madelaine Bhat on 08/15/2015 14:46:13

## 2015-08-28 DIAGNOSIS — I482 Chronic atrial fibrillation: Secondary | ICD-10-CM | POA: Diagnosis not present

## 2015-08-28 DIAGNOSIS — Z7901 Long term (current) use of anticoagulants: Secondary | ICD-10-CM | POA: Diagnosis not present

## 2015-08-29 ENCOUNTER — Ambulatory Visit: Payer: No Typology Code available for payment source | Admitting: Surgery

## 2015-09-05 ENCOUNTER — Ambulatory Visit: Payer: No Typology Code available for payment source | Admitting: Surgery

## 2015-09-14 DIAGNOSIS — I8290 Acute embolism and thrombosis of unspecified vein: Secondary | ICD-10-CM | POA: Diagnosis not present

## 2015-09-14 DIAGNOSIS — Z7901 Long term (current) use of anticoagulants: Secondary | ICD-10-CM | POA: Diagnosis not present

## 2015-09-21 DIAGNOSIS — M7989 Other specified soft tissue disorders: Secondary | ICD-10-CM | POA: Diagnosis not present

## 2015-09-21 DIAGNOSIS — I890 Lymphedema, not elsewhere classified: Secondary | ICD-10-CM | POA: Diagnosis not present

## 2015-09-21 DIAGNOSIS — L97929 Non-pressure chronic ulcer of unspecified part of left lower leg with unspecified severity: Secondary | ICD-10-CM | POA: Diagnosis not present

## 2015-09-27 ENCOUNTER — Ambulatory Visit: Payer: No Typology Code available for payment source | Admitting: Surgery

## 2015-10-02 ENCOUNTER — Encounter: Payer: Self-pay | Admitting: *Deleted

## 2015-10-03 ENCOUNTER — Ambulatory Visit: Payer: No Typology Code available for payment source | Admitting: Surgery

## 2015-10-03 DIAGNOSIS — I890 Lymphedema, not elsewhere classified: Secondary | ICD-10-CM | POA: Diagnosis not present

## 2015-10-03 DIAGNOSIS — L97929 Non-pressure chronic ulcer of unspecified part of left lower leg with unspecified severity: Secondary | ICD-10-CM | POA: Diagnosis not present

## 2015-10-03 DIAGNOSIS — I82509 Chronic embolism and thrombosis of unspecified deep veins of unspecified lower extremity: Secondary | ICD-10-CM | POA: Diagnosis not present

## 2015-10-03 DIAGNOSIS — M7989 Other specified soft tissue disorders: Secondary | ICD-10-CM | POA: Diagnosis not present

## 2015-10-10 ENCOUNTER — Encounter: Payer: Medicare Other | Attending: Internal Medicine | Admitting: Internal Medicine

## 2015-10-10 DIAGNOSIS — L97821 Non-pressure chronic ulcer of other part of left lower leg limited to breakdown of skin: Secondary | ICD-10-CM | POA: Diagnosis not present

## 2015-10-10 DIAGNOSIS — I872 Venous insufficiency (chronic) (peripheral): Secondary | ICD-10-CM | POA: Diagnosis not present

## 2015-10-10 DIAGNOSIS — Z86718 Personal history of other venous thrombosis and embolism: Secondary | ICD-10-CM | POA: Diagnosis not present

## 2015-10-10 DIAGNOSIS — M1990 Unspecified osteoarthritis, unspecified site: Secondary | ICD-10-CM | POA: Diagnosis not present

## 2015-10-10 DIAGNOSIS — Z7901 Long term (current) use of anticoagulants: Secondary | ICD-10-CM | POA: Diagnosis not present

## 2015-10-10 DIAGNOSIS — I482 Chronic atrial fibrillation: Secondary | ICD-10-CM | POA: Diagnosis not present

## 2015-10-10 DIAGNOSIS — I890 Lymphedema, not elsewhere classified: Secondary | ICD-10-CM | POA: Insufficient documentation

## 2015-10-10 DIAGNOSIS — L97321 Non-pressure chronic ulcer of left ankle limited to breakdown of skin: Secondary | ICD-10-CM | POA: Diagnosis not present

## 2015-10-10 DIAGNOSIS — L97221 Non-pressure chronic ulcer of left calf limited to breakdown of skin: Secondary | ICD-10-CM | POA: Insufficient documentation

## 2015-10-10 DIAGNOSIS — I83222 Varicose veins of left lower extremity with both ulcer of calf and inflammation: Secondary | ICD-10-CM | POA: Insufficient documentation

## 2015-10-12 DIAGNOSIS — Z7901 Long term (current) use of anticoagulants: Secondary | ICD-10-CM | POA: Diagnosis not present

## 2015-10-12 DIAGNOSIS — I8290 Acute embolism and thrombosis of unspecified vein: Secondary | ICD-10-CM | POA: Diagnosis not present

## 2015-10-12 NOTE — Progress Notes (Signed)
[name], [name] ([id number]) Visit Report for 10/10/2015 Chief Complaint Document Details Patient Name: [name], [name]. Date of Service: 10/10/2015 2:45 PM Medical Record Patient Account Number: [id number] Number: Treating RN: Clover Mealy, RN, [name], Rita 1929/03/13 [phone] y.o. Other Clinician: Date of Birth/Sex: Male) Treating ROBSON, MICHAEL Primary Care Physician: Guerry Bruin Physician/Extender: G Referring Physician: Demaris Callander in Treatment: 12 Information Obtained from: Patient Chief Complaint Left calf and ankle ulcerations. Electronic Signature(s) Signed: 10/10/2015 4:36:57 PM By: Baltazar Najjar MD Entered By: Baltazar Najjar on 10/10/2015 16:27:27 [name] ([id number]) -------------------------------------------------------------------------------- HPI Details Patient Name: [name]. Date of Service: 10/10/2015 2:45 PM Medical Record Patient Account Number: [id number] Number: Treating RN: Clover Mealy, RN, [name], Rita Jan 16, 1929 [phone] y.o. Other Clinician: Date of Birth/Sex: Male) Treating ROBSON, MICHAEL Primary Care Physician: Guerry Bruin Physician/Extender: G Referring Physician: Demaris Callander in Treatment: 12 History of Present Illness HPI Description: Pleasant 82 year old with history of chronic venous insufficiency, remote history of DVT, atrial fibrillation (on Coumadin). No history of diabetes. He says that he developed worsening left lower extremity swelling, blisters, and subsequent ulcerations in late October 2016. He was started on Augmentin for left lower extremity cellulitis, which has improved. Arterial ultrasound 07/27/2015 showed no significant peripheral arterial disease. Awaiting venous ultrasound. Performing dressing changes with silver alginate and using a Tubigrip for edema control. He denies any significant pain. No claudication or rest pain. Ambulating per his baseline with a walker. No fever  or chills. Moderate drainage. 10/10/15; the patient arrives back today having not been seen in almost 2 months. He has no open wounds on his legs. He has a history of venous insufficiency, arterial insufficiency. He has chronic Coumadin use secondary to atrial fibrillation and a remote history of DVT. Fortunately as he has not been here in 2 months we were not able to order him stockings. He is not able to get on standard compression hose. Juxtalite stockings would cost him probably about $100 appear, the couple was not able to afford this. Electronic Signature(s) Signed: 10/10/2015 4:36:57 PM By: Baltazar Najjar MD Entered By: Baltazar Najjar on 10/10/2015 16:28:49 [name] ([id number]) -------------------------------------------------------------------------------- Physical Exam Details Patient Name: Donald Musca C. Date of Service: 10/10/2015 2:45 PM Medical Record Patient Account Number: [id number] Number: Treating RN: Clover Mealy, RN, [name], Rita Mar 02, 1929 [phone] y.o. Other Clinician: Date of Birth/Sex: Male) Treating ROBSON, MICHAEL Primary Care Physician: Guerry Bruin Physician/Extender: G Referring Physician: Demaris Callander in Treatment: 12 Notes Wound exam; amazingly nearly all the wounds described previously of healed. He has what looks to be a combination of venous insufficiency and arterial insufficiency. Electronic Signature(s) Signed: 10/10/2015 4:36:57 PM By: Baltazar Najjar MD Entered By: Baltazar Najjar on 10/10/2015 16:32:25 [name] ([id number]) -------------------------------------------------------------------------------- Physician Orders Details Patient Name: [name] Date of Service: 10/10/2015 2:45 PM Medical Record Patient Account Number: [id number] Number: Treating RN: Clover Mealy, RN, [name], Rita 08-May-1929 [phone] y.o. Other Clinician: Date of Birth/Sex: Male) Treating ROBSON, MICHAEL Primary Care Physician:  Guerry Bruin Physician/Extender: G Referring Physician: Demaris Callander in Treatment: 43 Verbal / Phone Orders: Yes Clinician: Afful, RN, [name], Rita Read Back and Verified: Yes Diagnosis Coding Edema Control o Patient to wear own compression stockings Discharge From [facility] Services o [facility] - Treatment Completed. Wounds Healed. Patient instructed by MD to go to any pharmacy retail store to purchase TEDS hose. Patient/wife verbalized understanding. Will see patient on as needed basis Electronic Signature(s) Signed: 10/10/2015 4:36:57  PM By: Baltazar Najjar MD Signed: 10/11/2015 4:11:51 PM By: Elpidio Eric [name], RN Entered By: Elpidio Eric on 10/10/2015 15:10:21 [name] ([id number]) -------------------------------------------------------------------------------- Problem List Details Patient Name: [name], [name] Date of Service: 10/10/2015 2:45 PM Medical Record Patient Account Number: [id number] Number: Treating RN: Clover Mealy, RN, [name], Rita [birth year] [phone] y.o. Other Clinician: Date of Birth/Sex: Male) Treating ROBSON, MICHAEL Primary Care Physician: Guerry Bruin Physician/Extender: G Referring Physician: Demaris Callander in Treatment: 12 Active Problems ICD-10 Encounter Code Description Active Date Diagnosis I83.222 Varicose veins of left lower extremity with both ulcer of 07/18/2015 Yes calf and inflammation I89.0 Lymphedema, not elsewhere classified 07/18/2015 Yes I87.2 Venous insufficiency (chronic) (peripheral) 07/18/2015 Yes I48.2 Chronic atrial fibrillation 07/18/2015 Yes Z79.01 Long term (current) use of anticoagulants 07/18/2015 Yes Inactive Problems Resolved Problems ICD-10 Code Description Active Date Resolved Date L03.116 Cellulitis of left lower limb 07/18/2015 07/18/2015 Electronic Signature(s) Signed: 10/10/2015 4:36:57 PM By: Baltazar Najjar MD Entered By: Baltazar Najjar on 10/10/2015  16:27:13 [name] ([id number]) -------------------------------------------------------------------------------- Progress Note Details Patient Name: Donald Musca C. Date of Service: 10/10/2015 2:45 PM Medical Record Patient Account Number: [id number] Number: Treating RN: Clover Mealy, RN, [name], Rita 1929-09-07 [phone] y.o. Other Clinician: Date of Birth/Sex: Male) Treating ROBSON, MICHAEL Primary Care Physician: Guerry Bruin Physician/Extender: G Referring Physician: Demaris Callander in Treatment: 12 Subjective Chief Complaint Information obtained from Patient Left calf and ankle ulcerations. History of Present Illness (HPI) Pleasant 82 year old with history of chronic venous insufficiency, remote history of DVT, atrial fibrillation (on Coumadin). No history of diabetes. He says that he developed worsening left lower extremity swelling, blisters, and subsequent ulcerations in late October 2016. He was started on Augmentin for left lower extremity cellulitis, which has improved. Arterial ultrasound 07/27/2015 showed no significant peripheral arterial disease. Awaiting venous ultrasound. Performing dressing changes with silver alginate and using a Tubigrip for edema control. He denies any significant pain. No claudication or rest pain. Ambulating per his baseline with a walker. No fever or chills. Moderate drainage. 10/10/15; the patient arrives back today having not been seen in almost 2 months. He has no open wounds on his legs. He has a history of venous insufficiency, arterial insufficiency. He has chronic Coumadin use secondary to atrial fibrillation and a remote history of DVT. Fortunately as he has not been here in 2 months we were not able to order him stockings. He is not able to get on standard compression hose. Juxtalite stockings would cost him probably about $100 appear, the couple was not able to afford this. Objective Constitutional Vitals Time Taken:  2:51 PM, Height: 70 in, Weight: 144 lbs, BMI: 20.7, Temperature: 97.7 F, Pulse: 64 bpm, Respiratory Rate: 18 breaths/min, Blood Pressure: 130/54 mmHg. Integumentary (Hair, Skin) Wound #2 status is Open. Original cause of wound was Blister. The wound is located on the Left,Medial Lower Leg. The wound measures 0cm length x 0cm width x 0cm depth; 0cm^2 area and 0cm^3 volume. The wound is limited to skin breakdown. There is no tunneling or undermining noted. There is a none Yeoman, Jermell C. ([id number]) present amount of drainage noted. The wound margin is flat and intact. There is no granulation within the wound bed. There is no necrotic tissue within the wound bed. The periwound skin appearance exhibited: Dry/Scaly. The periwound skin appearance did not exhibit: Callus, Crepitus, Excoriation, Fluctuance, Friable, Induration, Localized Edema, Rash, Scarring, Maceration, Moist, Atrophie Blanche, Cyanosis, Ecchymosis, Hemosiderin Staining, Mottled, Pallor, Rubor, Erythema. Periwound temperature was noted as No  Abnormality. Wound #3 status is Healed - Epithelialized. Original cause of wound was Gradually Appeared. The wound is located on the Left,Medial Malleolus. The wound measures 0cm length x 0cm width x 0cm depth; 0cm^2 area and 0cm^3 volume. The wound is limited to skin breakdown. There is no tunneling or undermining noted. There is a none present amount of drainage noted. The wound margin is distinct with the outline attached to the wound base. There is no granulation within the wound bed. There is no necrotic tissue within the wound bed. The periwound skin appearance exhibited: Dry/Scaly. The periwound skin appearance did not exhibit: Callus, Crepitus, Excoriation, Fluctuance, Friable, Induration, Localized Edema, Rash, Scarring, Maceration, Moist, Atrophie Blanche, Cyanosis, Ecchymosis, Hemosiderin Staining, Mottled, Pallor, Rubor, Erythema. Periwound temperature was noted as No  Abnormality. Wound #4 status is Healed - Epithelialized. Original cause of wound was Trauma. The wound is located on the Left,Proximal,Anterior Lower Leg. The wound measures 0cm length x 0cm width x 0cm depth; 0cm^2 area and 0cm^3 volume. The wound is limited to skin breakdown. There is no tunneling or undermining noted. There is a none present amount of drainage noted. The wound margin is flat and intact. There is large (67-100%) red granulation within the wound bed. There is no necrotic tissue within the wound bed. The periwound skin appearance exhibited: Localized Edema, Dry/Scaly. The periwound skin appearance did not exhibit: Callus, Crepitus, Excoriation, Fluctuance, Friable, Induration, Rash, Scarring, Maceration, Moist, Atrophie Blanche, Cyanosis, Ecchymosis, Hemosiderin Staining, Mottled, Pallor, Rubor, Erythema. Periwound temperature was noted as No Abnormality. Assessment Active Problems ICD-10 I83.222 - Varicose veins of left lower extremity with both ulcer of calf and inflammation I89.0 - Lymphedema, not elsewhere classified I87.2 - Venous insufficiency (chronic) (peripheral) I48.2 - Chronic atrial fibrillation Z79.01 - Long term (current) use of anticoagulants Plan Edema Control: Patient to wear own compression stockings MADIX, BLOWE ([id number]) Discharge From [facility] Services: Discharge from [facility] Center - Treatment Completed. Wounds Healed. Patient instructed by MD to go to any pharmacy retail store to purchase TEDS hose. Patient/wife verbalized understanding. Will see patient on as needed basis Unfortunatley the patient cannot apply stockings or juxtalites. recommended TED hose. I suspect we may be seeing hime back in the future. Electronic Signature(s) Signed: 10/10/2015 4:36:57 PM By: Baltazar Najjar MD Entered By: Baltazar Najjar on 10/10/2015 16:34:27 [name]  ([id number]) -------------------------------------------------------------------------------- SuperBill Details Patient Name: Donald Musca C. Date of Service: 10/10/2015 Medical Record Patient Account Number: [id number] Number: Treating RN: Clover Mealy, RN, [name], Rita 19-Nov-1928 [phone] y.o. Other Clinician: Date of Birth/Sex: Male) Treating ROBSON, MICHAEL Primary Care Physician: Guerry Bruin Physician/Extender: G Referring Physician: Demaris Callander in Treatment: 12 Diagnosis Coding ICD-10 Codes Code Description [phone] Varicose veins of left lower extremity with both ulcer of calf and inflammation I89.0 Lymphedema, not elsewhere classified I87.2 Venous insufficiency (chronic) (peripheral) I48.2 Chronic atrial fibrillation Z79.01 Long term (current) use of anticoagulants Facility Procedures CPT4 Code: [id number] Description: [phone] - WOUND CARE VISIT-LEV 1 EST PT Modifier: Quantity: 1 Physician Procedures CPT4: Description Modifier Quantity Code [id number] - WC PHYS LEVEL 2 - EST PT 1 ICD-10 Description Diagnosis I83.222 Varicose veins of left lower extremity with both ulcer of calf and inflammation Electronic Signature(s) Signed: 10/10/2015 4:36:57 PM By: Baltazar Najjar MD Entered By: Baltazar Najjar on 10/10/2015 16:34:51

## 2015-10-12 NOTE — Progress Notes (Signed)
CHER, EGNOR ([id number]) Visit Report for 10/10/2015 Arrival Information Details Patient Name: [name], [name]. Date of Service: 10/10/2015 2:45 PM Medical Record Number: [id number] Patient Account Number: [id number] Date of Birth/Sex: Jul 05, 1929 (82 y.o. Male) Treating RN: Clover Mealy, RN, [name], [place] Sink Primary Care Physician: Guerry Bruin Other Clinician: Referring Physician: Guerry Bruin Treating Physician/Extender: Rudene Re in Treatment: 12 Visit Information History Since Last Visit Added or deleted any medications: No Patient Arrived: Wheel Chair Any new allergies or adverse reactions: No Arrival Time: 14:48 Had a fall or experienced change in No Accompanied By: wife activities of daily living that may affect Transfer Assistance: EasyPivot Patient risk of falls: Lift Signs or symptoms of abuse/neglect since last No Patient Identification Verified: Yes visito Secondary Verification Process Yes Hospitalized since last visit: No Completed: Pain Present Now: No Patient Requires Transmission- No Based Precautions: Patient Has Alerts: Yes Patient Alerts: Patient on Blood Thinner warfarin NOT Diabetic Electronic Signature(s) Signed: 10/11/2015 4:11:51 PM By: Elpidio Eric [name], RN Entered By: Elpidio Eric on 10/10/2015 14:48:43 [name] ([id number]) -------------------------------------------------------------------------------- Clinic Level of Care Assessment Details Patient Name: [name]. Date of Service: 10/10/2015 2:45 PM Medical Record Patient Account Number: [id number] Number: Treating RN: Clover Mealy, RN, [name], Rita 1928-09-26 [phone] y.o. Other Clinician: Date of Birth/Sex: Male) Treating ROBSON, MICHAEL Primary Care Physician: Guerry Bruin Physician/Extender: G Referring Physician: Demaris Callander in Treatment: 12 Clinic Level of Care Assessment Items TOOL 4 Quantity Score  - Use when only an EandM is performed on  FOLLOW-UP visit 0 ASSESSMENTS - Nursing Assessment / Reassessment  - Reassessment of Co-morbidities (includes updates in patient status) 0  - Reassessment of Adherence to Treatment Plan 0 ASSESSMENTS - Wound and Skin Assessment / Reassessment  - Simple Wound Assessment / Reassessment - one wound 0  - Complex Wound Assessment / Reassessment - multiple wounds 0  - Dermatologic / Skin Assessment (not related to wound area) 0 ASSESSMENTS - Focused Assessment  - Circumferential Edema Measurements - multi extremities 0  - Nutritional Assessment / Counseling / Intervention 0 X - Lower Extremity Assessment (monofilament, tuning fork, pulses) 1 5  - Peripheral Arterial Disease Assessment (using hand held doppler) 0 ASSESSMENTS - Ostomy and/or Continence Assessment and Care  - Incontinence Assessment and Management 0  - Ostomy Care Assessment and Management (repouching, etc.) 0 PROCESS - Coordination of Care X - Simple Patient / Family Education for ongoing care 1 15  - Complex (extensive) Patient / Family Education for ongoing care 0  - Staff obtains Chiropractor, Records, Test Results / Process Orders 0  - Staff telephones HHA, Nursing Homes / Clarify orders / etc 0 [name], [name] ([id number])  - Routine Transfer to another Facility (non-emergent condition) 0  - Routine Hospital Admission (non-emergent condition) 0  - New Admissions / Manufacturing engineer / Ordering NPWT, Apligraf, etc. 0  - Emergency Hospital Admission (emergent condition) 0  - Simple Discharge Coordination 0  - Complex (extensive) Discharge Coordination 0 PROCESS - Special Needs  - Pediatric / Minor Patient Management 0  - Isolation Patient Management 0  - Hearing / Language / Visual special needs 0  - Assessment of Community assistance (transportation, D/C planning, etc.) 0  - Additional assistance / Altered mentation 0  - Support Surface(s) Assessment (bed, cushion,  seat, etc.) 0 INTERVENTIONS - Wound Cleansing / Measurement  - Simple Wound Cleansing - one wound 0  - Complex Wound Cleansing - multiple wounds 0 X - Wound Imaging (photographs -  any number of wounds) 1 5 []  - Wound Tracing (instead of photographs) 0 []  - Simple Wound Measurement - one wound 0 []  - Complex Wound Measurement - multiple wounds 0 INTERVENTIONS - Wound Dressings []  - Small Wound Dressing one or multiple wounds 0 []  - Medium Wound Dressing one or multiple wounds 0 []  - Large Wound Dressing one or multiple wounds 0 []  - Application of Medications - topical 0 []  - Application of Medications - injection 0 [name], [name] ([id number]) INTERVENTIONS - Miscellaneous []  - External ear exam 0 []  - Specimen Collection (cultures, biopsies, blood, body fluids, etc.) 0 []  - Specimen(s) / Culture(s) sent or taken to Lab for analysis 0 []  - Patient Transfer (multiple staff / Michiel Sites Lift / Similar devices) 0 []  - Simple Staple / Suture removal (25 or less) 0 []  - Complex Staple / Suture removal (26 or more) 0 []  - Hypo / Hyperglycemic Management (close monitor of Blood Glucose) 0 []  - Ankle / Brachial Index (ABI) - do not check if billed separately 0 X - Vital Signs 1 5 Has the patient been seen at the hospital within the last three years: Yes Total Score: 30 Level Of Care: New/Established - Level 1 Electronic Signature(s) Signed: 10/11/2015 4:11:51 PM By: Elpidio Eric [name], RN Entered By: Elpidio Eric on 10/10/2015 15:10:45 [name] ([id number]) -------------------------------------------------------------------------------- Encounter Discharge Information Details Patient Name: Donald Musca C. Date of Service: 10/10/2015 2:45 PM Medical Record Patient Account Number: [id number] Number: Treating RN: Clover Mealy, RN, [name], Rita 1929/01/02 [phone] y.o. Other Clinician: Date of Birth/Sex: Male) Treating ROBSON, MICHAEL Primary Care Physician: Guerry Bruin Physician/Extender: G Referring Physician: Demaris Callander in Treatment: 12 Encounter Discharge Information Items Discharge Pain Level: 0 Discharge Condition: Stable Ambulatory Status: Wheelchair Discharge Destination: Home Transportation: Private Auto Accompanied By: wife Schedule Follow-up Appointment: No Medication Reconciliation completed and provided to Patient/Care No Tamyia Minich: Provided on Clinical Summary of Care: 10/10/2015 Form Type Recipient Paper Patient RW Electronic Signature(s) Signed: 10/10/2015 3:13:43 PM By: Gwenlyn Perking Entered By: Gwenlyn Perking on 10/10/2015 15:13:43 [name] ([id number]) -------------------------------------------------------------------------------- Lower Extremity Assessment Details Patient Name: Donald Musca C. Date of Service: 10/10/2015 2:45 PM Medical Record Number: [id number] Patient Account Number: [id number] Date of Birth/Sex: Feb 26, 1929 (82 y.o. Male) Treating RN: Clover Mealy, RN, [name], [place] Sink Primary Care Physician: Guerry Bruin Other Clinician: Referring Physician: Guerry Bruin Treating Physician/Extender: Rudene Re in Treatment: 12 Vascular Assessment Pulses: Posterior Tibial Dorsalis Pedis Palpable: [Left:Yes] Extremity colors, hair growth, and conditions: Extremity Color: [Left:Mottled] Hair Growth on Extremity: [Left:No] Temperature of Extremity: [Left:Warm] Capillary Refill: [Left:< 3 seconds] Toe Nail Assessment Left: Right: Thick: Yes Discolored: Yes Deformed: Yes Improper Length and Hygiene: Yes Electronic Signature(s) Signed: 10/11/2015 4:11:51 PM By: Elpidio Eric [name], RN Entered By: Elpidio Eric on 10/10/2015 14:46:57 [name] ([id number]) -------------------------------------------------------------------------------- Multi-Disciplinary Care Plan Details Patient Name: [name]. Date of Service: 10/10/2015 2:45 PM Medical Record Number: [id number] Patient  Account Number: [id number] Date of Birth/Sex: 1929/03/22 (81 y.o. Male) Treating RN: Clover Mealy, RN, [name], [place] Sink Primary Care Physician: Guerry Bruin Other Clinician: Referring Physician: Guerry Bruin Treating Physician/Extender: Rudene Re in Treatment: 12 Active Inactive Electronic Signature(s) Signed: 10/11/2015 4:11:51 PM By: Elpidio Eric [name], RN Entered By: Elpidio Eric on 10/10/2015 15:06:21 [name] ([id number]) -------------------------------------------------------------------------------- Pain Assessment Details Patient Name: Donald Musca C. Date of Service: 10/10/2015 2:45 PM Medical Record Patient Account Number: [id number] Number: Treating RN: Clover Mealy, RN, [name], Rita 02-Aug-1929 [phone] y.o. Other  Clinician: Date of Birth/Sex: Male) Treating ROBSON, MICHAEL Primary Care Physician: Guerry Bruin Physician/Extender: G Referring Physician: Demaris Callander in Treatment: 12 Active Problems Location of Pain Severity and Description of Pain Patient Has Paino No Site Locations Pain Management and Medication Current Pain Management: Electronic Signature(s) Signed: 10/11/2015 4:11:51 PM By: Elpidio Eric [name], RN Entered By: Elpidio Eric on 10/10/2015 14:48:54 [name] ([id number]) -------------------------------------------------------------------------------- Patient/Caregiver Education Details Patient Name: [name]. Date of Service: 10/10/2015 2:45 PM Medical Record Patient Account Number: [id number] Number: Treating RN: Clover Mealy, RN, [name], Rita September 07, 1929 [phone] y.o. Other Clinician: Date of Birth/Gender: Male) Treating Leanord Hawking MICHAEL Primary Care Physician: Guerry Bruin Physician/Extender: G Referring Physician: Demaris Callander in Treatment: 12 Education Assessment Education Provided To: Patient Education Topics Provided Basic Hygiene: Methods: Explain/Verbal Responses: State content  correctly Electronic Signature(s) Signed: 10/11/2015 4:11:51 PM By: Elpidio Eric [name], RN Entered By: Elpidio Eric on 10/10/2015 15:13:25 [name] ([id number]) -------------------------------------------------------------------------------- Wound Assessment Details Patient Name: Donald Musca C. Date of Service: 10/10/2015 2:45 PM Medical Record Number: [id number] Patient Account Number: [id number] Date of Birth/Sex: Sep 01, 1929 (82 y.o. Male) Treating RN: Clover Mealy, RN, [name], [employer] Sink Primary Care Physician: Guerry Bruin Other Clinician: Referring Physician: Guerry Bruin Treating Physician/Extender: Rudene Re in Treatment: 12 Wound Status Wound Number: 2 Primary Venous Leg Ulcer Etiology: Wound Location: Left Lower Leg - Medial Wound Open Wounding Event: Blister Status: Date Acquired: 07/04/2015 Comorbid Cataracts, Arrhythmia, Deep Vein Weeks Of Treatment: 12 History: Thrombosis, Osteoarthritis Clustered Wound: No Photos Photo Uploaded By: Elpidio Eric on 10/10/2015 17:07:25 Wound Measurements Length: (cm) 0 % Reduction in Width: (cm) 0 % Reduction in Depth: (cm) 0 Epithelializat Area: (cm) 0 Tunneling: Volume: (cm) 0 Undermining: Area: 100% Volume: 100% ion: Large (67-100%) No No Wound Description Full Thickness Without Exposed Classification: Support Structures Wound Margin: Flat and Intact Exudate None Present Amount: Foul Odor After Cleansing: No Wound Bed Granulation Amount: None Present (0%) Exposed Structure Necrotic Amount: None Present (0%) Fascia Exposed: No Fat Layer Exposed: No Tendon Exposed: No Muscle Exposed: No [name], [name] ([id number]) Joint Exposed: No Bone Exposed: No Limited to Skin Breakdown Periwound Skin Texture Texture Color No Abnormalities Noted: No No Abnormalities Noted: No Callus: No Atrophie Blanche: No Crepitus: No Cyanosis: No Excoriation: No Ecchymosis: No Fluctuance: No Erythema:  No Friable: No Hemosiderin Staining: No Induration: No Mottled: No Localized Edema: No Pallor: No Rash: No Rubor: No Scarring: No Temperature / Pain Moisture Temperature: No Abnormality No Abnormalities Noted: No Dry / Scaly: Yes Maceration: No Moist: No Wound Preparation Ulcer Cleansing: Rinsed/Irrigated with Saline Topical Anesthetic Applied: None Electronic Signature(s) Signed: 10/11/2015 4:11:51 PM By: Elpidio Eric [name], RN Entered By: Elpidio Eric on 10/10/2015 15:03:31 [name] ([id number]) -------------------------------------------------------------------------------- Wound Assessment Details Patient Name: Donald Musca C. Date of Service: 10/10/2015 2:45 PM Medical Record Number: [id number] Patient Account Number: [id number] Date of Birth/Sex: 1929/03/24 (82 y.o. Male) Treating RN: Clover Mealy, RN, [name], [place] Sink Primary Care Physician: Guerry Bruin Other Clinician: Referring Physician: Guerry Bruin Treating Physician/Extender: Rudene Re in Treatment: 12 Wound Status Wound Number: 3 Primary Venous Leg Ulcer Etiology: Wound Location: Left Malleolus - Medial Wound Healed - Epithelialized Wounding Event: Gradually Appeared Status: Date Acquired: 05/16/2015 Comorbid Cataracts, Arrhythmia, Deep Vein Weeks Of Treatment: 12 History: Thrombosis, Osteoarthritis Clustered Wound: No Photos Photo Uploaded By: Elpidio Eric on 10/10/2015 17:08:41 Wound Measurements Length: (cm) 0 % Reduction Width: (cm) 0 % Reduction Depth: (cm) 0 Epitheliali Area: (cm) 0 Tunneling: Volume: (cm)  0 Underminin in Area: 100% in Volume: 100% zation: Large (67-100%) No g: No Wound Description Classification: Partial Thickness Wound Margin: Distinct, outline attached Exudate Amount: None Present Foul Odor After Cleansing: No Wound Bed [name], [name] ([id number]) Granulation Amount: None Present (0%) Exposed Structure Necrotic Amount: None Present (0%) Fascia  Exposed: No Fat Layer Exposed: No Tendon Exposed: No Muscle Exposed: No Joint Exposed: No Bone Exposed: No Limited to Skin Breakdown Periwound Skin Texture Texture Color No Abnormalities Noted: No No Abnormalities Noted: No Callus: No Atrophie Blanche: No Crepitus: No Cyanosis: No Excoriation: No Ecchymosis: No Fluctuance: No Erythema: No Friable: No Hemosiderin Staining: No Induration: No Mottled: No Localized Edema: No Pallor: No Rash: No Rubor: No Scarring: No Temperature / Pain Moisture Temperature: No Abnormality No Abnormalities Noted: No Dry / Scaly: Yes Maceration: No Moist: No Wound Preparation Ulcer Cleansing: Rinsed/Irrigated with Saline Electronic Signature(s) Signed: 10/11/2015 4:11:51 PM By: Elpidio Eric [name], RN Entered By: Elpidio Eric on 10/10/2015 15:04:41 [name] ([id number]) -------------------------------------------------------------------------------- Wound Assessment Details Patient Name: Donald Musca C. Date of Service: 10/10/2015 2:45 PM Medical Record Number: [id number] Patient Account Number: [id number] Date of Birth/Sex: 1929/05/21 (82 y.o. Male) Treating RN: Clover Mealy, RN, [name], [place] Sink Primary Care Physician: Guerry Bruin Other Clinician: Referring Physician: Guerry Bruin Treating Physician/Extender: Rudene Re in Treatment: 12 Wound Status Wound Number: 4 Primary Skin Tear Etiology: Wound Location: Left Lower Leg - Anterior, Proximal Wound Healed - Epithelialized Status: Wounding Event: Trauma Comorbid Cataracts, Arrhythmia, Deep Vein Date Acquired: 07/23/2015 History: Thrombosis, Osteoarthritis Weeks Of Treatment: 11 Clustered Wound: No Photos Photo Uploaded By: Elpidio Eric on 10/10/2015 17:08:05 Wound Measurements Length: (cm) 0 % Reduction Width: (cm) 0 % Reduction Depth: (cm) 0 Epithelializ Area: (cm) 0 Tunneling: Volume: (cm) 0 Undermining in Area: 100% in Volume: 100% ation: None No :  No Wound Description Classification: Partial Thickness Wound Margin: Flat and Intact Exudate Amount: None Present Foul Odor After Cleansing: No Wound Bed Granulation Amount: Large (67-100%) Exposed Structure Granulation Quality: Red Fascia Exposed: No Necrotic Amount: None Present (0%) Fat Layer Exposed: No Tendon Exposed: No Muscle Exposed: No [name], [name] ([id number]) Joint Exposed: No Bone Exposed: No Limited to Skin Breakdown Periwound Skin Texture Texture Color No Abnormalities Noted: No No Abnormalities Noted: No Callus: No Atrophie Blanche: No Crepitus: No Cyanosis: No Excoriation: No Ecchymosis: No Fluctuance: No Erythema: No Friable: No Hemosiderin Staining: No Induration: No Mottled: No Localized Edema: Yes Pallor: No Rash: No Rubor: No Scarring: No Temperature / Pain Moisture Temperature: No Abnormality No Abnormalities Noted: No Dry / Scaly: Yes Maceration: No Moist: No Wound Preparation Ulcer Cleansing: Rinsed/Irrigated with Saline Topical Anesthetic Applied: None Electronic Signature(s) Signed: 10/11/2015 4:11:51 PM By: Elpidio Eric [name], RN Entered By: Elpidio Eric on 10/10/2015 15:05:51 [name] ([id number]) -------------------------------------------------------------------------------- Vitals Details Patient Name: Donald Musca C. Date of Service: 10/10/2015 2:45 PM Medical Record Number: [id number] Patient Account Number: [id number] Date of Birth/Sex: 1928-11-05 (82 y.o. Male) Treating RN: Clover Mealy, RN, [name], Rita Primary Care Physician: Guerry Bruin Other Clinician: Referring Physician: Guerry Bruin Treating Physician/Extender: Rudene Re in Treatment: 12 Vital Signs Time Taken: 14:51 Temperature (F): 97.7 Height (in): 70 Pulse (bpm): 64 Weight (lbs): 144 Respiratory Rate (breaths/min): 18 Body Mass Index (BMI): 20.7 Blood Pressure (mmHg): 130/54 Reference Range: 80 - 120 mg / dl Electronic  Signature(s) Signed: 10/11/2015 4:11:51 PM By: Elpidio Eric [name], RN Entered By: Elpidio Eric on 10/10/2015 14:52:38

## 2015-10-23 ENCOUNTER — Encounter: Payer: Self-pay | Admitting: Physician Assistant

## 2015-10-29 NOTE — Progress Notes (Signed)
Cardiology Office Note Date:  10/30/2015  Patient ID:  [name], [name] [birth year], MRN [id number] PCP:  [name], MD  Cardiologist/Electrophysiologist: Dr. [name]   Chief Complaint: Past due in-clinic pacer check/OV  History of Present Illness: [name] is a 82 y.o. male with history of PAFib, DDD, DJD, spinal stenosis, tachy-brady syndrome with PPM, comes to the office today feeling well outside of his arthritis and chronic pain.  He denies any palpitations, no CP or SOB, sleeps well, is here today in a wheelchair usually with an electric scooter, will use a walker in the house to get in/out of the BR, no falls reported, very limited ambulation with his spinal stenosis.    He was last seen in the office Feb 2015, at that time doing well.  They come today realizing it had been a couple years.  He is accompanied by his wife, they report he sees his PMD routinely who checks monitors and manages his warfarin and does his routine labs as well, all reported to be stable.  He denies any bleeding or signs of bleeding, says his INRs have been stable with little changing of his warfarin doses.  The patient has not been making transmissions from home, though report they have been sending them, we have not received any, [employer] industry has reviewed with them the equipment and how to make a transmission again.  Past Medical History  Diagnosis Date  . PAF (paroxysmal atrial fibrillation) (HCC)   . Atrial tachycardia (HCC)   . Tachycardia-bradycardia syndrome (HCC)     s/p PPM by ST 04/08/10, RV lead has dislodged but  appears stable AAIR  . Spinal stenosis   . DJD (degenerative joint disease)   . DDD (degenerative disc disease)   . Scoliosis   . Phlebitis   . GERD (gastroesophageal reflux disease)   . Nocturia     Past Surgical History  Procedure Laterality Date  . Inguinal hernia repair  1992  . Pacemaker insertion  04/08/10    [employer] - Revo - implanted by Dr. Deborah Chalk  04/08/10  RV lead has dislodged, though pt is stable AAIR    Current Outpatient Prescriptions  Medication Sig Dispense Refill  . Ascorbic Acid 500 MG CAPS Take 500 mg by mouth 2 (two) times daily.    . Coenzyme Q10 (CO Q 10 PO) Take 100 mg by mouth 2 (two) times daily.     . fish oil-omega-3 fatty acids 1000 MG capsule Take 2 g by mouth daily.      . Multiple Vitamin (MULTIVITAMIN) tablet Take 1 tablet by mouth 2 (two) times daily.      . sotalol (BETAPACE) 160 MG tablet TAKE 1 TABLET BY MOUTH TWICE DAILY 180 tablet 3  . VITAMIN D, CHOLECALCIFEROL, PO Take 5,000 Units by mouth daily.     Marland Kitchen VITAMIN E PO Take 400 Units by mouth daily.     Marland Kitchen warfarin (COUMADIN) 2 MG tablet Take as directed bu Dr Wylene Simmer 90 tablet 3   No current facility-administered medications for this visit.    Allergies:   Review of patient's allergies indicates no known allergies.   Social History:  The patient  reports that he has never smoked. He has never used smokeless tobacco. He reports that he does not drink alcohol or use illicit drugs.   Family History:  The patient's family history includes Cancer in his father.  ROS:  Please see the history of present illness.  All other systems are reviewed and otherwise negative.   PHYSICAL EXAM:  VS:  BP 142/74 mmHg  Pulse 72  Ht  (1.727 m)  Wt 126 lb (57.153 kg)  BMI 19.16 kg/m2 BMI: Body mass index is 19.16 kg/(m^2). Well nourished, thin, elderly WM,  in no acute distress HEENT: normocephalic, atraumatic Neck: no JVD, carotid bruits or masses Cardiac:  normal S1, S2; RRR; no significant murmurs, no rubs, or gallops Lungs:  clear to auscultation bilaterally, no wheezing, rhonchi or rales Abd: soft, nontender MS: no deformity , he has age appropriate atrophy Ext: no edema Skin: warm and dry, no rash Neuro:  No gross deficits appreciated Psych: euthymic mood, full affect  PPM site is stable, no tethering or discomfort   EKG:  Done today shows A  pacing, APC's, QTc appears stable PPM interrogation today: functioning as programmed AAIR, known RV lead programmed off He has had multiple PAFin episodes, there are VT episodes in the device history, reviewed with industry felt to be AF, he A paces 91.5%.  Battery is OK  04/07/10: Echocardiogram Study Conclusions - Left ventricle: The cavity size was normal. Systolic function was  normal. The estimated ejection fraction was in the range of 55% to  60%. Wall motion was normal; there were no regional wall motion  abnormalities. - Left atrium: The atrium was mildly dilated. - Pulmonary arteries: PA peak pressure: 35mm Hg (S).  Recent Labs: No results found for requested labs within last 365 days.  No results found for requested labs within last 365 days.   CrCl cannot be calculated (Patient has no serum creatinine result on file.).   Wt Readings from Last 3 Encounters:  10/30/15 126 lb (57.153 kg)  10/28/13 156 lb 3.2 oz (70.852 kg)  10/07/12 160 lb 1.9 oz (72.63 kg)     Other studies reviewed: Additional studies/records reviewed today include: summarized above  DEVICE information: MDT PPM implanted Aug 2011, Dr. Deborah Chalk                                    Known RV lead perforation/dislodgement, patient has declined lead revision                                    Programmed AAI  ASSESSMENT AND PLAN:  1. Sick SInus Syndrome     PPM, MDT, patient has declined lead revision, re-dicussed today     q 3 month [facility] transmissions, they will call when they make their transmission ensure we have received it   2. PAFib     asymptomatic    CHADS2Vasc is at least 2 on warfarin, monitored and managed with his PMD    Sotalol, QTc is stable  Disposition: F/u with q 3 month [facility] transmissions, he follows routinely with his PMD, we will see him back in 6 months, sooner if needed for EP.  Current medicines are reviewed at length with the patient today.  The patient did not have  any concerns regarding medicines.  Judith Blonder, PA-C 10/30/2015 9:01 AM     [facility] [address] [place] [address] [phone] (office)  [phone] (fax)

## 2015-10-30 ENCOUNTER — Ambulatory Visit (INDEPENDENT_AMBULATORY_CARE_PROVIDER_SITE_OTHER): Payer: Medicare Other | Admitting: Physician Assistant

## 2015-10-30 ENCOUNTER — Encounter: Payer: Self-pay | Admitting: Physician Assistant

## 2015-10-30 VITALS — BP 142/74 | HR 72 | Ht 68.0 in | Wt 126.0 lb

## 2015-10-30 DIAGNOSIS — I10 Essential (primary) hypertension: Secondary | ICD-10-CM

## 2015-10-30 DIAGNOSIS — I495 Sick sinus syndrome: Secondary | ICD-10-CM | POA: Diagnosis not present

## 2015-10-30 DIAGNOSIS — I4891 Unspecified atrial fibrillation: Secondary | ICD-10-CM | POA: Diagnosis not present

## 2015-10-30 NOTE — Patient Instructions (Addendum)
Medication Instructions:   NONE ORDER TODAY   If you need a refill on your cardiac medications before your next appointment, please call your pharmacy.  Labwork: NONE ORDER TODAY    Testing/Procedures: NONE ORDER TODAY    Follow-Up:  Remote monitoring is used to monitor your Pacemaker of ICD from home. This monitoring reduces the number of office visits required to check your device to one time per year. It allows [place] to keep an eye on the functioning of your device to ensure it is working properly. You are scheduled for a device check from home on .01/28/16..You may send your transmission at any time that day. If you have a wireless device, the transmission will be sent automatically. After your physician reviews your transmission, you will receive a postcard with your next transmission date.  Your physician wants you to follow-up in:  IN 6   MONTHS WITH Keitha Butte  PA-C You will receive a reminder letter in the mail two months in advance. If you don't receive a letter, please call our office to schedule the follow-up appointment.      Any Other Special Instructions Will Be Listed Below (If Applicable).

## 2015-11-23 DIAGNOSIS — Z7901 Long term (current) use of anticoagulants: Secondary | ICD-10-CM | POA: Diagnosis not present

## 2015-11-23 DIAGNOSIS — I8290 Acute embolism and thrombosis of unspecified vein: Secondary | ICD-10-CM | POA: Diagnosis not present

## 2015-12-14 DIAGNOSIS — L97909 Non-pressure chronic ulcer of unspecified part of unspecified lower leg with unspecified severity: Secondary | ICD-10-CM | POA: Diagnosis not present

## 2015-12-14 DIAGNOSIS — I482 Chronic atrial fibrillation: Secondary | ICD-10-CM | POA: Diagnosis not present

## 2015-12-14 DIAGNOSIS — K219 Gastro-esophageal reflux disease without esophagitis: Secondary | ICD-10-CM | POA: Diagnosis not present

## 2015-12-14 DIAGNOSIS — L309 Dermatitis, unspecified: Secondary | ICD-10-CM | POA: Diagnosis not present

## 2015-12-14 DIAGNOSIS — Z6822 Body mass index (BMI) 22.0-22.9, adult: Secondary | ICD-10-CM | POA: Diagnosis not present

## 2015-12-14 DIAGNOSIS — M79609 Pain in unspecified limb: Secondary | ICD-10-CM | POA: Diagnosis not present

## 2015-12-14 DIAGNOSIS — R634 Abnormal weight loss: Secondary | ICD-10-CM | POA: Diagnosis not present

## 2015-12-14 DIAGNOSIS — K589 Irritable bowel syndrome without diarrhea: Secondary | ICD-10-CM | POA: Diagnosis not present

## 2015-12-14 DIAGNOSIS — M47896 Other spondylosis, lumbar region: Secondary | ICD-10-CM | POA: Diagnosis not present

## 2015-12-14 DIAGNOSIS — Z1389 Encounter for screening for other disorder: Secondary | ICD-10-CM | POA: Diagnosis not present

## 2015-12-14 DIAGNOSIS — Z79899 Other long term (current) drug therapy: Secondary | ICD-10-CM | POA: Diagnosis not present

## 2015-12-14 DIAGNOSIS — Z7901 Long term (current) use of anticoagulants: Secondary | ICD-10-CM | POA: Diagnosis not present

## 2016-01-11 DIAGNOSIS — M5441 Lumbago with sciatica, right side: Secondary | ICD-10-CM | POA: Diagnosis not present

## 2016-01-11 DIAGNOSIS — G8929 Other chronic pain: Secondary | ICD-10-CM | POA: Diagnosis not present

## 2016-01-11 DIAGNOSIS — M5442 Lumbago with sciatica, left side: Secondary | ICD-10-CM | POA: Diagnosis not present

## 2016-01-11 DIAGNOSIS — M5136 Other intervertebral disc degeneration, lumbar region: Secondary | ICD-10-CM | POA: Diagnosis not present

## 2016-01-25 DIAGNOSIS — I8290 Acute embolism and thrombosis of unspecified vein: Secondary | ICD-10-CM | POA: Diagnosis not present

## 2016-01-25 DIAGNOSIS — Z7901 Long term (current) use of anticoagulants: Secondary | ICD-10-CM | POA: Diagnosis not present

## 2016-01-28 ENCOUNTER — Ambulatory Visit (INDEPENDENT_AMBULATORY_CARE_PROVIDER_SITE_OTHER): Payer: Medicare Other | Admitting: *Deleted

## 2016-01-28 ENCOUNTER — Telehealth: Payer: Self-pay | Admitting: Cardiology

## 2016-01-28 DIAGNOSIS — I495 Sick sinus syndrome: Secondary | ICD-10-CM | POA: Diagnosis not present

## 2016-01-28 NOTE — Telephone Encounter (Signed)
Attempted to confirm remote transmission with pt. No answer and was unable to leave a message.   

## 2016-02-01 ENCOUNTER — Encounter: Payer: Self-pay | Admitting: Cardiology

## 2016-02-01 NOTE — Progress Notes (Signed)
Remote pacemaker transmission.   

## 2016-02-15 LAB — CUP PACEART REMOTE DEVICE CHECK
Brady Statistic RA Percent Paced: 88.8 %
Date Time Interrogation Session: 20170609164351
Implantable Lead Implant Date: 20110802
Implantable Lead Implant Date: 20110802
Implantable Lead Location: 753859
Implantable Lead Model: 5086
Implantable Lead Model: 5086
Lead Channel Sensing Intrinsic Amplitude: 0.9 mV
MDC IDC LEAD LOCATION: 753860
MDC IDC MSMT LEADCHNL RA IMPEDANCE VALUE: 360 Ohm
MDC IDC MSMT LEADCHNL RV IMPEDANCE VALUE: 360 Ohm
MDC IDC SET LEADCHNL RA PACING AMPLITUDE: 2 V

## 2016-02-21 ENCOUNTER — Encounter: Payer: Self-pay | Admitting: Cardiology

## 2016-02-25 DIAGNOSIS — I8290 Acute embolism and thrombosis of unspecified vein: Secondary | ICD-10-CM | POA: Diagnosis not present

## 2016-02-25 DIAGNOSIS — Z7901 Long term (current) use of anticoagulants: Secondary | ICD-10-CM | POA: Diagnosis not present

## 2016-04-02 DIAGNOSIS — I482 Chronic atrial fibrillation: Secondary | ICD-10-CM | POA: Diagnosis not present

## 2016-04-02 DIAGNOSIS — Z7901 Long term (current) use of anticoagulants: Secondary | ICD-10-CM | POA: Diagnosis not present

## 2016-04-28 ENCOUNTER — Encounter: Payer: Medicare Other | Admitting: Physician Assistant

## 2016-05-06 DIAGNOSIS — I8290 Acute embolism and thrombosis of unspecified vein: Secondary | ICD-10-CM | POA: Diagnosis not present

## 2016-05-06 DIAGNOSIS — Z7901 Long term (current) use of anticoagulants: Secondary | ICD-10-CM | POA: Diagnosis not present

## 2016-05-06 DIAGNOSIS — I482 Chronic atrial fibrillation: Secondary | ICD-10-CM | POA: Diagnosis not present

## 2016-05-08 DIAGNOSIS — M5441 Lumbago with sciatica, right side: Secondary | ICD-10-CM | POA: Diagnosis not present

## 2016-05-08 DIAGNOSIS — M50322 Other cervical disc degeneration at C5-C6 level: Secondary | ICD-10-CM | POA: Diagnosis not present

## 2016-05-08 DIAGNOSIS — M5442 Lumbago with sciatica, left side: Secondary | ICD-10-CM | POA: Diagnosis not present

## 2016-05-08 DIAGNOSIS — G894 Chronic pain syndrome: Secondary | ICD-10-CM | POA: Diagnosis not present

## 2016-05-14 ENCOUNTER — Encounter: Payer: Medicare Other | Admitting: Physician Assistant

## 2016-05-14 NOTE — Progress Notes (Deleted)
Cardiology Office Note Date:  05/14/2016  Patient ID:  [name], [name] Jun 06, 1929, MRN [id number] PCP:  [name], MD  Cardiologist/Electrophysiologist: Dr. [name]   Chief Complaint: routine in-clinic pacer check/OV  History of Present Illness: [name] is a 82 y.o. male with history of PAFib, DDD, DJD, spinal stenosis, tachy-brady syndrome with PPM, comes to the office today feeling well outside of his arthritis and chronic pain.  He denies any palpitations, no CP or SOB, sleeps well, is here today in a wheelchair usually with an electric scooter, will use a walker in the house to get in/out of the BR, no falls reported, very limited ambulation with his spinal stenosis.    He comes in today to be seen for Dr. [name], last seen by EP service my myself in Feb, at that time, doing well, remote transmissions were discussed and he has been successful in making a transmission in June.    ***He is accompanied by his wife, they report he sees his PMD routinely who checks monitors and manages his warfarin and does his routine labs as well, all reported to be stable.  He denies any bleeding or signs of bleeding, says his INRs have been stable with little changing of his warfarin doses.    *** bleeding *** QTc sotalol *** next visit is with JA *** falls?  DEVICE information: MDT PPM implanted Aug 2011, Dr. Deborah Chalk                                    Known RV lead perforation/dislodgement, patient has declined lead revision                                    Programmed AAI   Past Medical History:  Diagnosis Date  . Atrial tachycardia (HCC)   . DDD (degenerative disc disease)   . DJD (degenerative joint disease)   . GERD (gastroesophageal reflux disease)   . Nocturia   . PAF (paroxysmal atrial fibrillation) (HCC)   . Phlebitis   . Scoliosis   . Spinal stenosis   . Tachycardia-bradycardia syndrome (HCC)    s/p PPM by ST 04/08/10, RV lead has dislodged but  appears  stable AAIR    Past Surgical History:  Procedure Laterality Date  . INGUINAL HERNIA REPAIR  1992  . PACEMAKER INSERTION  04/08/10   [employer] - Revo - implanted by Dr. Deborah Chalk 04/08/10  RV lead has dislodged, though pt is stable AAIR    Current Outpatient Prescriptions  Medication Sig Dispense Refill  . Ascorbic Acid 500 MG CAPS Take 500 mg by mouth 2 (two) times daily.    . Coenzyme Q10 (CO Q 10 PO) Take 100 mg by mouth 2 (two) times daily.     . fish oil-omega-3 fatty acids 1000 MG capsule Take 2 g by mouth daily.      . Multiple Vitamin (MULTIVITAMIN) tablet Take 1 tablet by mouth 2 (two) times daily.      . sotalol (BETAPACE) 160 MG tablet TAKE 1 TABLET BY MOUTH TWICE DAILY 180 tablet 3  . VITAMIN D, CHOLECALCIFEROL, PO Take 5,000 Units by mouth daily.     Marland Kitchen VITAMIN E PO Take 400 Units by mouth daily.     Marland Kitchen warfarin (COUMADIN) 2 MG tablet Take as directed bu Dr Wylene Simmer 90  tablet 3   No current facility-administered medications for this visit.     Allergies:   Review of patient's allergies indicates no known allergies.   Social History:  The patient  reports that he has never smoked. He has never used smokeless tobacco. He reports that he does not drink alcohol or use drugs.   Family History:  The patient's family history includes Cancer in his father.  ROS:  Please see the history of present illness.    All other systems are reviewed and otherwise negative.   PHYSICAL EXAM:  VS:  There were no vitals taken for this visit. BMI: There is no height or weight on file to calculate BMI. Well nourished, thin, elderly WM,  in no acute distress  HEENT: normocephalic, atraumatic  Neck: no JVD, carotid bruits or masses Cardiac:  normal S1, S2; RRR; no significant murmurs, no rubs, or gallops Lungs:  clear to auscultation bilaterally, no wheezing, rhonchi or rales  Abd: soft, nontender MS: no deformity , he has age appropriate atrophy Ext: no edema  Skin: warm and dry, no rash Neuro:   No gross deficits appreciated Psych: euthymic mood, full affect  PPM site is stable, no tethering or discomfort   EKG:  Done today shows A pacing, APC's, QTc appears stable PPM interrogation today: *** functioning as programmed AAIR, known RV lead programmed off He has had multiple PAFin episodes, there are VT episodes in the device history, reviewed with industry felt to be AF, he A paces 91.5%.  Battery is OK  04/07/10: Echocardiogram Study Conclusions - Left ventricle: The cavity size was normal. Systolic function was  normal. The estimated ejection fraction was in the range of 55% to  60%. Wall motion was normal; there were no regional wall motion  abnormalities. - Left atrium: The atrium was mildly dilated. - Pulmonary arteries: PA peak pressure: 35mm Hg (S).  Recent Labs: No results found for requested labs within last 8760 hours.  No results found for requested labs within last 8760 hours.   CrCl cannot be calculated (Unknown ideal weight.).   Wt Readings from Last 3 Encounters:  10/30/15 126 lb (57.2 kg)  10/28/13 156 lb 3.2 oz (70.9 kg)  10/07/12 160 lb 1.9 oz (72.6 kg)     Other studies reviewed: Additional studies/records reviewed today include: summarized above    ASSESSMENT AND PLAN:  1. Sick SInus Syndrome     ***PPM, MDT, patient has declined lead revision, re-dicussed today     q 3 month [facility] transmissions, they will call when they make their transmission ensure we have received it   2. PAFib     ***asymptomatic    CHADS2Vasc is at least 2 on warfarin, monitored and managed with his PMD    ***Sotalol, QTc is stable  Disposition:   Current medicines are reviewed at length with the patient today.  The patient did not have any concerns regarding medicines.  Judith BlonderSigned, Renee Ursy, PA-C 05/14/2016 5:39 AM     [facility] [address] [place] [address] [phone] (office)  [phone]) [phone] (fax)

## 2016-05-16 ENCOUNTER — Ambulatory Visit (INDEPENDENT_AMBULATORY_CARE_PROVIDER_SITE_OTHER): Payer: Medicare Other | Admitting: Physician Assistant

## 2016-05-16 ENCOUNTER — Encounter: Payer: Self-pay | Admitting: Physician Assistant

## 2016-05-16 VITALS — BP 138/72 | HR 66 | Ht 66.0 in | Wt 153.0 lb

## 2016-05-16 DIAGNOSIS — I495 Sick sinus syndrome: Secondary | ICD-10-CM | POA: Diagnosis not present

## 2016-05-16 DIAGNOSIS — I480 Paroxysmal atrial fibrillation: Secondary | ICD-10-CM | POA: Diagnosis not present

## 2016-05-16 DIAGNOSIS — Z79899 Other long term (current) drug therapy: Secondary | ICD-10-CM

## 2016-05-16 LAB — CUP PACEART INCLINIC DEVICE CHECK
Battery Voltage: 2.96 V
Brady Statistic RA Percent Paced: 80.21 %
Implantable Lead Implant Date: 20110802
Implantable Lead Location: 753859
Implantable Lead Location: 753860
Implantable Lead Model: 5086
Implantable Lead Model: 5086
Lead Channel Impedance Value: 344 Ohm
Lead Channel Pacing Threshold Amplitude: 1 V
Lead Channel Pacing Threshold Pulse Width: 0.4 ms
MDC IDC LEAD IMPLANT DT: 20110802
MDC IDC MSMT LEADCHNL RA IMPEDANCE VALUE: 352 Ohm
MDC IDC MSMT LEADCHNL RA SENSING INTR AMPL: 1.126 mV
MDC IDC MSMT LEADCHNL RV SENSING INTR AMPL: 1.38 mV
MDC IDC SESS DTM: 20170908140149
MDC IDC SET LEADCHNL RA PACING AMPLITUDE: 2 V

## 2016-05-16 NOTE — Patient Instructions (Signed)
Medication Instructions:   Your physician recommends that you continue on your current medications as directed. Please refer to the Current Medication list given to you today.   If you need a refill on your cardiac medications before your next appointment, please call your pharmacy.  Labwork: NONE ORDER TODAY    Testing/Procedures:NONE ORDER TODAY    Follow-Up: Your physician wants you to follow-up in:  IN  6  MONTHS WITH DR TAYLOR  You will receive a reminder letter in the mail two months in advance. If you don't receive a letter, please call our office to schedule the follow-up appointment.      Any Other Special Instructions Will Be Listed Below (If Applicable).                                                                                                                                                   

## 2016-05-16 NOTE — Progress Notes (Signed)
Cardiology Office Note Date:  05/16/16  Patient ID:  [name], [name] June 22, 1929, MRN [id number] PCP:  [name], MD                      Cardiologist/Electrophysiologist: Dr. [name]   Chief Complaint: routine in-clinic pacer check/OV  History of Present Illness: [name] is a 82 y.o. male with history of PAFib, DDD, DJD, spinal stenosis, tachy-brady syndrome with PPM, comes to the office today feeling well outside of his arthritis and chronic pain.  He denies any palpitations, no CP or SOB, sleeps well, is here today in a wheelchair usually with an electric scooter, will use a walker in the house to get in/out of the BR, no falls reported, very limited ambulation with his spinal stenosis.    He comes in today to be seen for Dr. [name], last seen by EP service my myself in Feb, at that time, doing well, remote transmissions were discussed and he has been successful in making a transmission in June.    He is accompanied by his wife, they again report he sees his PMD routinely who checks monitors and manages his warfarin and does his routine labs as well, all reported to continue to be stable.  He denies any bleeding or signs of bleeding, says his INRs have been stable with little changing of his warfarin doses.  He denies any cardiac concerns, states his heart is the least of his troubles reporting significant musculoskeletal pains/problems.  He denies any kind of CP, palpitations, is unaware of his PAFib, no SOB, no dizziness, near syncope or syncope.   DEVICE information: MDT PPM implanted Aug 2011, Dr. Deborah Chalk                                    Known RV lead perforation/dislodgement, patient has declined lead revision                                    Programmed AAI       Past Medical History:  Diagnosis Date  . Atrial tachycardia (HCC)   . DDD (degenerative disc disease)   . DJD (degenerative joint disease)   . GERD (gastroesophageal reflux  disease)   . Nocturia   . PAF (paroxysmal atrial fibrillation) (HCC)   . Phlebitis   . Scoliosis   . Spinal stenosis   . Tachycardia-bradycardia syndrome (HCC)    s/p PPM by ST 04/08/10, RV lead has dislodged but  appears stable AAIR         Past Surgical History:  Procedure Laterality Date  . INGUINAL HERNIA REPAIR  1992  . PACEMAKER INSERTION  04/08/10   [employer] - Revo - implanted by Dr. Deborah Chalk 04/08/10  RV lead has dislodged, though pt is stable AAIR          Current Outpatient Prescriptions  Medication Sig Dispense Refill  . Ascorbic Acid 500 MG CAPS Take 500 mg by mouth 2 (two) times daily.    . Coenzyme Q10 (CO Q 10 PO) Take 100 mg by mouth 2 (two) times daily.     . fish oil-omega-3 fatty acids 1000 MG capsule Take 2 g by mouth daily.      . Multiple Vitamin (MULTIVITAMIN) tablet Take 1 tablet by mouth 2 (two) times  daily.      . sotalol (BETAPACE) 160 MG tablet TAKE 1 TABLET BY MOUTH TWICE DAILY 180 tablet 3  . VITAMIN D, CHOLECALCIFEROL, PO Take 5,000 Units by mouth daily.     Marland Kitchen. VITAMIN E PO Take 400 Units by mouth daily.     Marland Kitchen. warfarin (COUMADIN) 2 MG tablet Take as directed bu Dr Wylene Simmerisovec 90 tablet 3   No current facility-administered medications for this visit.     Allergies:   Review of patient's allergies indicates no known allergies.   Social History:  The patient  reports that he has never smoked. He has never used smokeless tobacco. He reports that he does not drink alcohol or use drugs.   Family History:  The patient's family history includes Cancer in his father.  ROS:  Please see the history of present illness.    All other systems are reviewed and otherwise negative.   PHYSICAL EXAM:  VS:  Well nourished, thin, elderly WM,  in no acute distress  HEENT: normocephalic, atraumatic  Neck: no JVD, carotid bruits or masses Cardiac:   RRR; no significant murmurs, no rubs, or gallops Lungs:  clear to auscultation bilaterally,  no wheezing, rhonchi or rales  Abd: soft, nontender MS: no deformity , he has age appropriate atrophy Ext: no edema, chronic looking skin changes Skin: warm and dry, no rash Neuro:  No gross deficits appreciated Psych: euthymic mood, full affect  PPM site is stable, no tethering or discomfort (R chest)   EKG:  Done today and reviewed by myself shows A pacing, QTc stable PPM interrogation today: functioning as programmed AAIR, known RV lead programmed off He has had multiple PAFin episodes, the longest 3 hours, AF burden is 11.8%, he A paces 80.2%.  Battery A lead status is OK  04/07/10: Echocardiogram Study Conclusions - Left ventricle: The cavity size was normal. Systolic function was  normal. The estimated ejection fraction was in the range of 55% to  60%. Wall motion was normal; there were no regional wall motion  abnormalities. - Left atrium: The atrium was mildly dilated. - Pulmonary arteries: PA peak pressure: 35mm Hg (S).  Recent Labs: No results found for requested labs within last 8760 hours.  No results found for requested labs within last 8760 hours.   CrCl cannot be calculated (Unknown ideal weight.).      Wt Readings from Last 3 Encounters:  10/30/15 126 lb (57.2 kg)  10/28/13 156 lb 3.2 oz (70.9 kg)  10/07/12 160 lb 1.9 oz (72.6 kg)     Other studies reviewed: Additional studies/records reviewed today include: summarized above    ASSESSMENT AND PLAN:  1. Sick SInus Syndrome     PPM, MDT, patient has declined lead revision, this has been discussed on a number of visits     q 3 month [facility] transmissions, they will call when they make their transmission ensure we have received it   2. PAFib     asymptomatic    CHADS2Vasc is at least 2 on warfarin, monitored and managed with his PMD    Sotalol, QTc is stable  Disposition: 6 months with Dr. Johney FrameAllred, sooner if needed.  Current medicines are reviewed at length with the patient  today.  The patient did not have any concerns regarding medicines.  Signed, Sherrilee GillesRenee Ursy, PA-C 05/16/16  [facility] [address] [place] [address] [phone] (office)  [phone] (fax)

## 2016-06-11 DIAGNOSIS — I8290 Acute embolism and thrombosis of unspecified vein: Secondary | ICD-10-CM | POA: Diagnosis not present

## 2016-06-11 DIAGNOSIS — Z79899 Other long term (current) drug therapy: Secondary | ICD-10-CM | POA: Diagnosis not present

## 2016-06-11 DIAGNOSIS — L309 Dermatitis, unspecified: Secondary | ICD-10-CM | POA: Diagnosis not present

## 2016-06-11 DIAGNOSIS — Z7901 Long term (current) use of anticoagulants: Secondary | ICD-10-CM | POA: Diagnosis not present

## 2016-06-11 DIAGNOSIS — R972 Elevated prostate specific antigen [PSA]: Secondary | ICD-10-CM | POA: Diagnosis not present

## 2016-06-11 DIAGNOSIS — I482 Chronic atrial fibrillation: Secondary | ICD-10-CM | POA: Diagnosis not present

## 2016-06-11 DIAGNOSIS — K589 Irritable bowel syndrome without diarrhea: Secondary | ICD-10-CM | POA: Diagnosis not present

## 2016-06-11 DIAGNOSIS — M47896 Other spondylosis, lumbar region: Secondary | ICD-10-CM | POA: Diagnosis not present

## 2016-06-11 DIAGNOSIS — K219 Gastro-esophageal reflux disease without esophagitis: Secondary | ICD-10-CM | POA: Diagnosis not present

## 2016-06-11 DIAGNOSIS — M1990 Unspecified osteoarthritis, unspecified site: Secondary | ICD-10-CM | POA: Diagnosis not present

## 2016-07-22 DIAGNOSIS — I8290 Acute embolism and thrombosis of unspecified vein: Secondary | ICD-10-CM | POA: Diagnosis not present

## 2016-07-22 DIAGNOSIS — Z7901 Long term (current) use of anticoagulants: Secondary | ICD-10-CM | POA: Diagnosis not present

## 2016-07-22 DIAGNOSIS — I482 Chronic atrial fibrillation: Secondary | ICD-10-CM | POA: Diagnosis not present

## 2016-08-18 ENCOUNTER — Telehealth: Payer: Self-pay | Admitting: Cardiology

## 2016-08-18 ENCOUNTER — Encounter: Payer: Medicare Other | Admitting: *Deleted

## 2016-08-18 NOTE — Telephone Encounter (Signed)
Spoke with pt and reminded pt of remote transmission that is due today. Pt verbalized understanding.   

## 2016-08-22 ENCOUNTER — Encounter: Payer: Self-pay | Admitting: Cardiology

## 2016-08-22 DIAGNOSIS — Z7901 Long term (current) use of anticoagulants: Secondary | ICD-10-CM | POA: Diagnosis not present

## 2016-08-22 DIAGNOSIS — I8290 Acute embolism and thrombosis of unspecified vein: Secondary | ICD-10-CM | POA: Diagnosis not present

## 2016-09-02 ENCOUNTER — Ambulatory Visit (INDEPENDENT_AMBULATORY_CARE_PROVIDER_SITE_OTHER): Payer: Medicare Other | Admitting: *Deleted

## 2016-09-02 DIAGNOSIS — I480 Paroxysmal atrial fibrillation: Secondary | ICD-10-CM

## 2016-09-02 DIAGNOSIS — I495 Sick sinus syndrome: Secondary | ICD-10-CM

## 2016-09-03 NOTE — Progress Notes (Signed)
Remote pacemaker transmission.   

## 2016-09-04 LAB — CUP PACEART REMOTE DEVICE CHECK
Date Time Interrogation Session: 20171228154826
Implantable Lead Implant Date: 20110802
Implantable Lead Location: 753859
Implantable Lead Location: 753860
Implantable Lead Model: 5086
Implantable Lead Model: 5086
Lead Channel Impedance Value: 368 Ohm
Lead Channel Impedance Value: 368 Ohm
Lead Channel Setting Pacing Amplitude: 2 V
MDC IDC LEAD IMPLANT DT: 20110802
MDC IDC MSMT BATTERY VOLTAGE: 2.96 V
MDC IDC PG IMPLANT DT: 20110802
MDC IDC SET LEADCHNL RV SENSING SENSITIVITY: 0.45 mV
MDC IDC STAT BRADY RA PERCENT PACED: 71.3 %

## 2016-09-05 ENCOUNTER — Encounter: Payer: Self-pay | Admitting: Cardiology

## 2016-09-18 DIAGNOSIS — G8929 Other chronic pain: Secondary | ICD-10-CM | POA: Diagnosis not present

## 2016-09-18 DIAGNOSIS — M50322 Other cervical disc degeneration at C5-C6 level: Secondary | ICD-10-CM | POA: Diagnosis not present

## 2016-09-18 DIAGNOSIS — M5442 Lumbago with sciatica, left side: Secondary | ICD-10-CM | POA: Diagnosis not present

## 2016-09-18 DIAGNOSIS — M5441 Lumbago with sciatica, right side: Secondary | ICD-10-CM | POA: Diagnosis not present

## 2016-09-30 DIAGNOSIS — Z7901 Long term (current) use of anticoagulants: Secondary | ICD-10-CM | POA: Diagnosis not present

## 2016-09-30 DIAGNOSIS — I8290 Acute embolism and thrombosis of unspecified vein: Secondary | ICD-10-CM | POA: Diagnosis not present

## 2016-09-30 DIAGNOSIS — I482 Chronic atrial fibrillation: Secondary | ICD-10-CM | POA: Diagnosis not present

## 2016-11-14 DIAGNOSIS — I8290 Acute embolism and thrombosis of unspecified vein: Secondary | ICD-10-CM | POA: Diagnosis not present

## 2016-11-14 DIAGNOSIS — I482 Chronic atrial fibrillation: Secondary | ICD-10-CM | POA: Diagnosis not present

## 2016-11-14 DIAGNOSIS — Z7901 Long term (current) use of anticoagulants: Secondary | ICD-10-CM | POA: Diagnosis not present

## 2016-12-09 ENCOUNTER — Encounter: Payer: Self-pay | Admitting: Internal Medicine

## 2016-12-24 ENCOUNTER — Encounter: Payer: Medicare Other | Admitting: Internal Medicine

## 2016-12-31 DIAGNOSIS — Z7901 Long term (current) use of anticoagulants: Secondary | ICD-10-CM | POA: Diagnosis not present

## 2016-12-31 DIAGNOSIS — I482 Chronic atrial fibrillation: Secondary | ICD-10-CM | POA: Diagnosis not present

## 2017-01-14 DIAGNOSIS — G894 Chronic pain syndrome: Secondary | ICD-10-CM | POA: Diagnosis not present

## 2017-01-14 DIAGNOSIS — Z79891 Long term (current) use of opiate analgesic: Secondary | ICD-10-CM | POA: Diagnosis not present

## 2017-01-14 DIAGNOSIS — M50322 Other cervical disc degeneration at C5-C6 level: Secondary | ICD-10-CM | POA: Diagnosis not present

## 2017-01-14 DIAGNOSIS — M5136 Other intervertebral disc degeneration, lumbar region: Secondary | ICD-10-CM | POA: Diagnosis not present

## 2017-02-06 DIAGNOSIS — I8290 Acute embolism and thrombosis of unspecified vein: Secondary | ICD-10-CM | POA: Diagnosis not present

## 2017-02-06 DIAGNOSIS — Z7901 Long term (current) use of anticoagulants: Secondary | ICD-10-CM | POA: Diagnosis not present

## 2017-02-06 DIAGNOSIS — I482 Chronic atrial fibrillation: Secondary | ICD-10-CM | POA: Diagnosis not present

## 2017-03-16 DIAGNOSIS — Z7901 Long term (current) use of anticoagulants: Secondary | ICD-10-CM | POA: Diagnosis not present

## 2017-03-16 DIAGNOSIS — I8290 Acute embolism and thrombosis of unspecified vein: Secondary | ICD-10-CM | POA: Diagnosis not present

## 2017-04-14 DIAGNOSIS — M1990 Unspecified osteoarthritis, unspecified site: Secondary | ICD-10-CM | POA: Diagnosis not present

## 2017-04-14 DIAGNOSIS — Z7901 Long term (current) use of anticoagulants: Secondary | ICD-10-CM | POA: Diagnosis not present

## 2017-04-14 DIAGNOSIS — R972 Elevated prostate specific antigen [PSA]: Secondary | ICD-10-CM | POA: Diagnosis not present

## 2017-04-14 DIAGNOSIS — I482 Chronic atrial fibrillation: Secondary | ICD-10-CM | POA: Diagnosis not present

## 2017-04-14 DIAGNOSIS — M47896 Other spondylosis, lumbar region: Secondary | ICD-10-CM | POA: Diagnosis not present

## 2017-04-14 DIAGNOSIS — K589 Irritable bowel syndrome without diarrhea: Secondary | ICD-10-CM | POA: Diagnosis not present

## 2017-04-14 DIAGNOSIS — K219 Gastro-esophageal reflux disease without esophagitis: Secondary | ICD-10-CM | POA: Diagnosis not present

## 2017-04-29 DIAGNOSIS — R972 Elevated prostate specific antigen [PSA]: Secondary | ICD-10-CM | POA: Diagnosis not present

## 2017-06-02 DIAGNOSIS — R197 Diarrhea, unspecified: Secondary | ICD-10-CM | POA: Diagnosis not present

## 2017-06-02 DIAGNOSIS — R150 Incomplete defecation: Secondary | ICD-10-CM | POA: Diagnosis not present

## 2017-06-08 ENCOUNTER — Other Ambulatory Visit
Admission: RE | Admit: 2017-06-08 | Discharge: 2017-06-08 | Disposition: A | Discharge status: Home / Self Care | Payer: Medicare Other | Source: Ambulatory Visit | Attending: Gastroenterology | Admitting: Gastroenterology

## 2017-06-08 DIAGNOSIS — R197 Diarrhea, unspecified: Secondary | ICD-10-CM | POA: Insufficient documentation

## 2017-06-08 LAB — GASTROINTESTINAL PANEL BY PCR, STOOL (REPLACES STOOL CULTURE)

## 2017-06-09 DIAGNOSIS — Z7901 Long term (current) use of anticoagulants: Secondary | ICD-10-CM | POA: Diagnosis not present

## 2017-06-09 DIAGNOSIS — I8290 Acute embolism and thrombosis of unspecified vein: Secondary | ICD-10-CM | POA: Diagnosis not present

## 2017-06-25 DIAGNOSIS — G894 Chronic pain syndrome: Secondary | ICD-10-CM | POA: Diagnosis not present

## 2017-06-25 DIAGNOSIS — M50322 Other cervical disc degeneration at C5-C6 level: Secondary | ICD-10-CM | POA: Diagnosis not present

## 2017-06-25 DIAGNOSIS — M5136 Other intervertebral disc degeneration, lumbar region: Secondary | ICD-10-CM | POA: Diagnosis not present

## 2017-09-18 DIAGNOSIS — Z79899 Other long term (current) drug therapy: Secondary | ICD-10-CM | POA: Insufficient documentation

## 2017-09-18 DIAGNOSIS — G894 Chronic pain syndrome: Secondary | ICD-10-CM | POA: Insufficient documentation

## 2017-09-18 DIAGNOSIS — M542 Cervicalgia: Secondary | ICD-10-CM

## 2017-09-18 DIAGNOSIS — G8929 Other chronic pain: Secondary | ICD-10-CM | POA: Insufficient documentation

## 2017-09-24 DIAGNOSIS — M545 Low back pain: Secondary | ICD-10-CM | POA: Diagnosis not present

## 2017-09-24 DIAGNOSIS — Z79891 Long term (current) use of opiate analgesic: Secondary | ICD-10-CM | POA: Insufficient documentation

## 2017-09-24 DIAGNOSIS — G894 Chronic pain syndrome: Secondary | ICD-10-CM | POA: Diagnosis not present

## 2017-09-24 DIAGNOSIS — G8929 Other chronic pain: Secondary | ICD-10-CM | POA: Insufficient documentation

## 2017-10-01 DIAGNOSIS — H5203 Hypermetropia, bilateral: Secondary | ICD-10-CM | POA: Diagnosis not present

## 2017-10-01 DIAGNOSIS — H52223 Regular astigmatism, bilateral: Secondary | ICD-10-CM | POA: Diagnosis not present

## 2017-10-01 DIAGNOSIS — Z9842 Cataract extraction status, left eye: Secondary | ICD-10-CM | POA: Diagnosis not present

## 2017-10-01 DIAGNOSIS — Z9841 Cataract extraction status, right eye: Secondary | ICD-10-CM | POA: Diagnosis not present

## 2017-11-02 DIAGNOSIS — Z79899 Other long term (current) drug therapy: Secondary | ICD-10-CM | POA: Diagnosis not present

## 2017-11-02 DIAGNOSIS — R972 Elevated prostate specific antigen [PSA]: Secondary | ICD-10-CM | POA: Diagnosis not present

## 2017-11-02 DIAGNOSIS — M1990 Unspecified osteoarthritis, unspecified site: Secondary | ICD-10-CM | POA: Diagnosis not present

## 2017-11-02 DIAGNOSIS — Z7901 Long term (current) use of anticoagulants: Secondary | ICD-10-CM | POA: Diagnosis not present

## 2017-11-02 DIAGNOSIS — K589 Irritable bowel syndrome without diarrhea: Secondary | ICD-10-CM | POA: Diagnosis not present

## 2017-11-02 DIAGNOSIS — K219 Gastro-esophageal reflux disease without esophagitis: Secondary | ICD-10-CM | POA: Diagnosis not present

## 2017-11-02 DIAGNOSIS — M47896 Other spondylosis, lumbar region: Secondary | ICD-10-CM | POA: Diagnosis not present

## 2017-11-02 DIAGNOSIS — I482 Chronic atrial fibrillation: Secondary | ICD-10-CM | POA: Diagnosis not present

## 2017-11-11 ENCOUNTER — Encounter: Payer: Medicare Other | Attending: Internal Medicine | Admitting: Internal Medicine

## 2017-11-11 DIAGNOSIS — I499 Cardiac arrhythmia, unspecified: Secondary | ICD-10-CM | POA: Insufficient documentation

## 2017-11-11 DIAGNOSIS — L97219 Non-pressure chronic ulcer of right calf with unspecified severity: Secondary | ICD-10-CM | POA: Diagnosis not present

## 2017-11-11 DIAGNOSIS — I495 Sick sinus syndrome: Secondary | ICD-10-CM | POA: Insufficient documentation

## 2017-11-11 DIAGNOSIS — L97812 Non-pressure chronic ulcer of other part of right lower leg with fat layer exposed: Secondary | ICD-10-CM | POA: Diagnosis not present

## 2017-11-11 DIAGNOSIS — Z950 Presence of cardiac pacemaker: Secondary | ICD-10-CM | POA: Diagnosis not present

## 2017-11-11 DIAGNOSIS — M1990 Unspecified osteoarthritis, unspecified site: Secondary | ICD-10-CM | POA: Insufficient documentation

## 2017-11-11 DIAGNOSIS — I87313 Chronic venous hypertension (idiopathic) with ulcer of bilateral lower extremity: Secondary | ICD-10-CM | POA: Diagnosis not present

## 2017-11-11 DIAGNOSIS — L97322 Non-pressure chronic ulcer of left ankle with fat layer exposed: Secondary | ICD-10-CM | POA: Diagnosis not present

## 2017-11-11 DIAGNOSIS — L97211 Non-pressure chronic ulcer of right calf limited to breakdown of skin: Secondary | ICD-10-CM | POA: Insufficient documentation

## 2017-11-11 DIAGNOSIS — I4891 Unspecified atrial fibrillation: Secondary | ICD-10-CM | POA: Diagnosis not present

## 2017-11-11 DIAGNOSIS — Z7901 Long term (current) use of anticoagulants: Secondary | ICD-10-CM | POA: Insufficient documentation

## 2017-11-11 DIAGNOSIS — I872 Venous insufficiency (chronic) (peripheral): Secondary | ICD-10-CM | POA: Insufficient documentation

## 2017-11-11 DIAGNOSIS — Z86718 Personal history of other venous thrombosis and embolism: Secondary | ICD-10-CM | POA: Diagnosis not present

## 2017-11-12 NOTE — Progress Notes (Addendum)
[name] ([id number]) Visit Report for 11/11/2017 Chief Complaint Document Details Patient Name: [name], [name]. Date of Service: 11/11/2017 8:00 AM Medical Record Number: [id number] Patient Account Number: [id number] Date of Birth/Sex: 1928-10-05 (84 y.o. M) Treating RN: Huel Coventry Primary Care Provider: Guerry Bruin Other Clinician: Referring Provider: Guerry Bruin Treating Provider/Extender: Altamese [place] in Treatment: 0 Information Obtained from: Patient Chief Complaint Left calf and ankle ulcerations. 11/11/17; patient is here with regards to wounds on his bilateral lower legs Electronic Signature(s) Signed: 11/11/2017 5:10:26 PM By: Baltazar Najjar MD Entered By: Baltazar Najjar on 11/11/2017 09:40:30 [name] ([id number]) -------------------------------------------------------------------------------- Debridement Details Patient Name: Donald Musca C. Date of Service: 11/11/2017 8:00 AM Medical Record Number: [id number] Patient Account Number: [id number] Date of Birth/Sex: 04/06/1929 (84 y.o. M) Treating RN: Huel Coventry Primary Care Provider: Guerry Bruin Other Clinician: Referring Provider: Guerry Bruin Treating Provider/Extender: Altamese [place] in Treatment: 0 Debridement Performed for Wound #5 Left,Medial Malleolus Assessment: Performed By: Physician Maxwell Caul, MD Debridement Type: Debridement Severity of Tissue Pre Fat layer exposed Debridement: Pre-procedure Verification/Time Yes - 08:48 Out Taken: Start Time: 08:48 Pain Control: Lidocaine 4% Topical Solution Total Area Debrided (L x W): 0.5 (cm) x 0.6 (cm) = 0.3 (cm) Tissue and other material Non-Viable, Fibrin/Exudate debrided: Level: Non-Viable Tissue Debridement Description: Selective/Open Wound Instrument: Other : saline and gauze Bleeding: Minimum Hemostasis Achieved: Pressure End Time: 08:50 Procedural Pain: 0 Post Procedural Pain:  0 Response to Treatment: Procedure was tolerated well Post Debridement Measurements of Total Wound Length: (cm) 0.5 Width: (cm) 0.6 Depth: (cm) 0.1 Volume: (cm) 0.024 Character of Wound/Ulcer Post Debridement: Stable Severity of Tissue Post Debridement: Fat layer exposed Post Procedure Diagnosis Same as Pre-procedure Electronic Signature(s) Signed: 12/02/2017 5:01:26 PM By: Elliot Gurney, [name], RN, CWS, [name], [name] Signed: 12/03/2017 8:16:15 AM By: Baltazar Najjar MD Entered By: Elliot Gurney, [name], RN, CWS, Kim on 12/02/2017 17:01:26 [name] ([id number]) -------------------------------------------------------------------------------- HPI Details Patient Name: Donald Musca C. Date of Service: 11/11/2017 8:00 AM Medical Record Number: [id number] Patient Account Number: [id number] Date of Birth/Sex: Jan 19, 1929 (84 y.o. M) Treating RN: Huel Coventry Primary Care Provider: Guerry Bruin Other Clinician: Referring Provider: Guerry Bruin Treating Provider/Extender: Altamese [place] in Treatment: 0 History of Present Illness HPI Description: Pleasant 84 year old with history of chronic venous insufficiency, remote history of DVT, atrial fibrillation (on Coumadin). No history of diabetes. He says that he developed worsening left lower extremity swelling, blisters, and subsequent ulcerations in late October 2016. He was started on Augmentin for left lower extremity cellulitis, which has improved. Arterial ultrasound 07/27/2015 showed no significant peripheral arterial disease. Awaiting venous ultrasound. Performing dressing changes with silver alginate and using a Tubigrip for edema control. He denies any significant pain. No claudication or rest pain. Ambulating per his baseline with a walker. No fever or chills. Moderate drainage. 10/10/15; the patient arrives back today having not been seen in almost 2 months. He has no open wounds on his legs. He has a history of venous  insufficiency, arterial insufficiency. He has chronic Coumadin use secondary to atrial fibrillation and a remote history of DVT. Fortunately as he has not been here in 2 months we were not able to order him stockings. He is not able to get on standard compression hose. Juxtalite stockings would cost him probably about $100 appear, the couple was not able to afford this. 11/11/17 READMISSION This is an 84 year old man who is not a diabetic. He does have a history  of sick sinus syndrome atrial fibrillation and has a cardiac pacemaker and is on Coumadin.he also has a history of DVT. His wife states that episodically over the last 2 years he has had wounds on his lower extremities but they have healed with home measures. Roughly 2-3 weeks ago the legs became a lot more swollen and he has had draining areas on the right anterior medial and lateral and the left medial leg. The patient's wife states that they were told to put support stockings on him but they are concerned because they worsen the blisters. this bit difficult to get the history but they think the edema has been increasing over the last several months. 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 He does not complain of chest pain orthopnea. He does not have a history of kidney issues that he is aware. Primary doctor at [facility]. He is not on diuretics. He was here 2 years ago. He was noted to have edema in his legs at that time. He had an ultrasound of his legs which I'll need to review. There was discussion about compression stockings but he is complaining clearly not using them. ABI in the right in this clinic was 1.37, 1.07 on the left Electronic Signature(s) Signed: 11/11/2017 5:10:26 PM By: Baltazar Najjar MD Entered By: Baltazar Najjar on 11/11/2017 09:56:24 [name] ([id number]) -------------------------------------------------------------------------------- Physical Exam  Details Patient Name: Donald Musca C. Date of Service: 11/11/2017 8:00 AM Medical Record Number: [id number] Patient Account Number: [id number] Date of Birth/Sex: May 12, 1929 (84 y.o. M) Treating RN: Huel Coventry Primary Care Provider: Guerry Bruin Other Clinician: Referring Provider: Guerry Bruin Treating Provider/Extender: Altamese [place] in Treatment: 0 Constitutional Sitting or standing Blood Pressure is within target range for patient.. Patient is hypertensive.. Pulse regular and within target range for patient.Marland Kitchen Respirations regular, non-labored and within target range.. Temperature is normal and within the target range for the patient.Marland Kitchen appears in no distress.somewhat frail elderly man but alert and responsive. Eyes Conjunctivae clear. No discharge. Respiratory Respiratory effort is easy and symmetric bilaterally. Rate is normal at rest and on room air.. kyphotic. Shallow entry but no crackles or wheezes. Cardiovascular heart sounds are fairly regular there was no gallops no murmurs JVP is elevated to the angle of the jaw. femoral pulses palpable. peripheral pulses are palpable. Edema present in both extremities.this is worse on the left. Edema extends into both thigh areas posteriorly. The edema is pitting.. Gastrointestinal (GI) Abdomen is soft and non-distended without masses or tenderness. Bowel sounds active in all quadrants.. No liver or spleen enlargement or tenderness.. Genitourinary (GU) there is no bladder distention. Lymphatic none palpable in the popliteal or inguinal area. Integumentary (Hair, Skin) chronic venous insufficiency and inflammation in the left greater than right leg. The anterior left leg has chronic erythema but no tenderness. Neurological some reduction in vibration sense and perhaps light touch. Psychiatric No evidence of depression, anxiety, or agitation. Calm, cooperative, and communicative. Appropriate interactions and  affect.. Notes 11/11/17; there are very tiny weeping areas on both lower legs. Paradoxically this is worse on the right where there is weeping edema fluid on the right anterior right lateral and right medial lower legs. Only present on the left medial near the ankle. He has severe bilateral pitting edema. This does not look like lymphedema. Chronic venous inflammation with severe skin damage left greater than right. The pitting edema extends right up into his upper thighs posteriorly Electronic  Signature(s) Signed: 11/11/2017 5:10:26 PM By: Baltazar Najjar MD Entered By: Baltazar Najjar on 11/11/2017 10:02:05 [name] ([id number]) -------------------------------------------------------------------------------- Physician Orders Details Patient Name: [name] Date of Service: 11/11/2017 8:00 AM Medical Record Number: [id number] Patient Account Number: [id number] Date of Birth/Sex: [birth year] (84 y.o. M) Treating RN: Huel Coventry Primary Care Provider: Guerry Bruin Other Clinician: Referring Provider: Guerry Bruin Treating Provider/Extender: Altamese [place] in Treatment: 0 Verbal / Phone Orders: No Diagnosis Coding Wound Cleansing Wound #5 Left,Medial Malleolus o Cleanse wound with mild soap and water Wound #6 Right,Medial Lower Leg o Cleanse wound with mild soap and water Wound #7 Right,Anterior Lower Leg o Cleanse wound with mild soap and water Wound #8 Right,Lateral Lower Leg o Cleanse wound with mild soap and water Skin Barriers/Peri-Wound Care Wound #5 Left,Medial Malleolus o Moisturizing lotion o Other: - OTC Lotrimin Cream on top of right foot. Wound #6 Right,Medial Lower Leg o Moisturizing lotion Wound #7 Right,Anterior Lower Leg o Moisturizing lotion Wound #8 Right,Lateral Lower Leg o Moisturizing lotion Primary Wound Dressing Wound #5 Left,Medial Malleolus o [place] Non-Adherent Wound #6 Right,Medial Lower Leg o  [place] Non-Adherent Wound #7 Right,Anterior Lower Leg o [place] Non-Adherent Wound #8 Right,Lateral Lower Leg o [place] Non-Adherent Secondary Dressing Wound #5 Left,Medial Malleolus o ABD pad Melcher, Shameek C. ([id number]) Wound #6 Right,Medial Lower Leg o ABD pad Wound #7 Right,Anterior Lower Leg o ABD pad Wound #8 Right,Lateral Lower Leg o ABD pad Dressing Change Frequency Wound #5 Left,Medial Malleolus o Change Dressing Monday, Wednesday, Friday Wound #6 Right,Medial Lower Leg o Change Dressing Monday, Wednesday, Friday Wound #7 Right,Anterior Lower Leg o Change Dressing Monday, Wednesday, Friday Wound #8 Right,Lateral Lower Leg o Change Dressing Monday, Wednesday, Friday Follow-up Appointments Wound #5 Left,Medial Malleolus o Return Appointment in 1 week. Wound #6 Right,Medial Lower Leg o Return Appointment in 1 week. Wound #7 Right,Anterior Lower Leg o Return Appointment in 1 week. Wound #8 Right,Lateral Lower Leg o Return Appointment in 1 week. Edema Control Wound #5 Left,Medial Malleolus o 3 Layer Compression System - Bilateral o Elevate legs to the level of the heart and pump ankles as often as possible Wound #6 Right,Medial Lower Leg o 3 Layer Compression System - Bilateral o Elevate legs to the level of the heart and pump ankles as often as possible Wound #7 Right,Anterior Lower Leg o 3 Layer Compression System - Bilateral o Elevate legs to the level of the heart and pump ankles as often as possible Wound #8 Right,Lateral Lower Leg o 3 Layer Compression System - Bilateral o Elevate legs to the level of the heart and pump ankles as often as possible Additional Orders / Instructions Wound #5 Left,Medial Malleolus o Vitamin A; Vitamin C, Zinc Mochizuki, Armando C. ([id number]) o Activity as tolerated Wound #6 Right,Medial Lower Leg o Vitamin A; Vitamin C, Zinc o Activity as tolerated Wound #7  Right,Anterior Lower Leg o Vitamin A; Vitamin C, Zinc o Activity as tolerated Wound #8 Right,Lateral Lower Leg o Vitamin A; Vitamin C, Zinc o Activity as tolerated Home Health Wound #5 Left,Medial Malleolus o Initiate Home Health for Skilled Nursing o Home Health Nurse may visit PRN to address patientos wound care needs. o FACE TO FACE ENCOUNTER: MEDICARE and MEDICAID PATIENTS: I certify that this patient is under my care and that I had a face-to-face encounter that meets the physician face-to-face encounter requirements with this patient on this date. The encounter with the patient was in whole or in part for the  following MEDICAL CONDITION: (primary reason for Home Healthcare) MEDICAL NECESSITY: I certify, that based on my findings, NURSING services are a medically necessary home health service. HOME BOUND STATUS: I certify that my clinical findings support that this patient is homebound (i.e., Due to illness or injury, pt requires aid of supportive devices such as crutches, cane, wheelchairs, walkers, the use of special transportation or the assistance of another person to leave their place of residence. There is a normal inability to leave the home and doing so requires considerable and taxing effort. Other absences are for medical reasons / religious services and are infrequent or of short duration when for other reasons). o If current dressing causes regression in wound condition, may D/C ordered dressing product/s and apply Normal Saline Moist Dressing daily until next Wound Healing Center / Other MD appointment. Notify Wound Healing Center of regression in wound condition at [phone]. o Please direct any NON-WOUND related issues/requests for orders to patient's Primary Care Physician Wound #6 Right,Medial Lower Leg o Initiate Home Health for Skilled Nursing o Home Health Nurse may visit PRN to address patientos wound care needs. o FACE TO FACE ENCOUNTER:  MEDICARE and MEDICAID PATIENTS: I certify that this patient is under my care and that I had a face-to-face encounter that meets the physician face-to-face encounter requirements with this patient on this date. The encounter with the patient was in whole or in part for the following MEDICAL CONDITION: (primary reason for Home Healthcare) MEDICAL NECESSITY: I certify, that based on my findings, NURSING services are a medically necessary home health service. HOME BOUND STATUS: I certify that my clinical findings support that this patient is homebound (i.e., Due to illness or injury, pt requires aid of supportive devices such as crutches, cane, wheelchairs, walkers, the use of special transportation or the assistance of another person to leave their place of residence. There is a normal inability to leave the home and doing so requires considerable and taxing effort. Other absences are for medical reasons / religious services and are infrequent or of short duration when for other reasons). o If current dressing causes regression in wound condition, may D/C ordered dressing product/s and apply Normal Saline Moist Dressing daily until next Wound Healing Center / Other MD appointment. Notify Wound Healing Center of regression in wound condition at [phone]. o Please direct any NON-WOUND related issues/requests for orders to patient's Primary Care Physician Wound #7 Right,Anterior Lower Leg o Initiate Home Health for Skilled Nursing o Home Health Nurse may visit PRN to address patientos wound care needs. o FACE TO FACE ENCOUNTER: MEDICARE and MEDICAID PATIENTS: I certify that this patient is under my care and that I had a face-to-face encounter that meets the physician face-to-face encounter requirements with this patient on this date. The encounter with the patient was in whole or in part for the following MEDICAL CONDITION: (primary reason for Home Healthcare) MEDICAL NECESSITY: I  certify, that based on my findings, VIRGIL, LIGHTNER ([id number]) NURSING services are a medically necessary home health service. HOME BOUND STATUS: I certify that my clinical findings support that this patient is homebound (i.e., Due to illness or injury, pt requires aid of supportive devices such as crutches, cane, wheelchairs, walkers, the use of special transportation or the assistance of another person to leave their place of residence. There is a normal inability to leave the home and doing so requires considerable and taxing effort. Other absences are for medical reasons / religious services and are infrequent or  of short duration when for other reasons). o If current dressing causes regression in wound condition, may D/C ordered dressing product/s and apply Normal Saline Moist Dressing daily until next Wound Healing Center / Other MD appointment. Notify Wound Healing Center of regression in wound condition at [phone]. o Please direct any NON-WOUND related issues/requests for orders to patient's Primary Care Physician Wound #8 Right,Lateral Lower Leg o Initiate Home Health for Skilled Nursing o Home Health Nurse may visit PRN to address patientos wound care needs. o FACE TO FACE ENCOUNTER: MEDICARE and MEDICAID PATIENTS: I certify that this patient is under my care and that I had a face-to-face encounter that meets the physician face-to-face encounter requirements with this patient on this date. The encounter with the patient was in whole or in part for the following MEDICAL CONDITION: (primary reason for Home Healthcare) MEDICAL NECESSITY: I certify, that based on my findings, NURSING services are a medically necessary home health service. HOME BOUND STATUS: I certify that my clinical findings support that this patient is homebound (i.e., Due to illness or injury, pt requires aid of supportive devices such as crutches, cane, wheelchairs, walkers, the use of special  transportation or the assistance of another person to leave their place of residence. There is a normal inability to leave the home and doing so requires considerable and taxing effort. Other absences are for medical reasons / religious services and are infrequent or of short duration when for other reasons). o If current dressing causes regression in wound condition, may D/C ordered dressing product/s and apply Normal Saline Moist Dressing daily until next Wound Healing Center / Other MD appointment. Notify Wound Healing Center of regression in wound condition at [phone]. o Please direct any NON-WOUND related issues/requests for orders to patient's Primary Care Physician Electronic Signature(s) Signed: 11/11/2017 4:24:03 PM By: Elliot Gurney, [name], RN, CWS, [name], [name] Signed: 11/11/2017 5:10:26 PM By: Baltazar Najjar MD Entered By: Elliot Gurney, [name], RN, CWS, Kim on 11/11/2017 09:07:57 [name] ([id number]) -------------------------------------------------------------------------------- Problem List Details Patient Name: CONG, HIGHTOWER. Date of Service: 11/11/2017 8:00 AM Medical Record Number: [id number] Patient Account Number: [id number] Date of Birth/Sex: September 22, 1928 (84 y.o. M) Treating RN: Huel Coventry Primary Care Provider: Guerry Bruin Other Clinician: Referring Provider: Guerry Bruin Treating Provider/Extender: Altamese [place] in Treatment: 0 Active Problems ICD-10 Impacting Encounter Code Description Active Date Wound Healing Diagnosis L97.211 Non-pressure chronic ulcer of right calf limited to breakdown 11/11/2017 Yes of skin L97.221 Non-pressure chronic ulcer of left calf limited to breakdown of 11/11/2017 Yes skin I87.333 Chronic venous hypertension (idiopathic) with ulcer and 11/11/2017 Yes inflammation of bilateral lower extremity Inactive Problems Resolved Problems Electronic Signature(s) Signed: 11/11/2017 5:10:26 PM By: Baltazar Najjar MD Entered By:  Baltazar Najjar on 11/11/2017 09:38:09 [name] ([id number]) -------------------------------------------------------------------------------- Progress Note Details Patient Name: Donald Musca C. Date of Service: 11/11/2017 8:00 AM Medical Record Number: [id number] Patient Account Number: [id number] Date of Birth/Sex: 1929/06/25 (84 y.o. M) Treating RN: Huel Coventry Primary Care Provider: Guerry Bruin Other Clinician: Referring Provider: Guerry Bruin Treating Provider/Extender: Altamese [place] in Treatment: 0 Subjective Chief Complaint Information obtained from Patient Left calf and ankle ulcerations. 11/11/17; patient is here with regards to wounds on his bilateral lower legs History of Present Illness (HPI) Pleasant 84 year old with history of chronic venous insufficiency, remote history of DVT, atrial fibrillation (on Coumadin). No history of diabetes. He says that he developed worsening left lower extremity swelling, blisters, and subsequent ulcerations in late October 2016.  He was started on Augmentin for left lower extremity cellulitis, which has improved. Arterial ultrasound 07/27/2015 showed no significant peripheral arterial disease. Awaiting venous ultrasound. Performing dressing changes with silver alginate and using a Tubigrip for edema control. He denies any significant pain. No claudication or rest pain. Ambulating per his baseline with a walker. No fever or chills. Moderate drainage. 10/10/15; the patient arrives back today having not been seen in almost 2 months. He has no open wounds on his legs. He has a history of venous insufficiency, arterial insufficiency. He has chronic Coumadin use secondary to atrial fibrillation and a remote history of DVT. Fortunately as he has not been here in 2 months we were not able to order him stockings. He is not able to get on standard compression hose. Juxtalite stockings would cost him probably about $100 appear, the  couple was not able to afford this. 11/11/17 READMISSION This is an 84 year old man who is not a diabetic. He does have a history of sick sinus syndrome atrial fibrillation and has a cardiac pacemaker and is on Coumadin.he also has a history of DVT. His wife states that episodically over the last 2 years he has had wounds on his lower extremities but they have healed with home measures. Roughly 2-3 weeks ago the legs became a lot more swollen and he has had draining areas on the right anterior medial and lateral and the left medial leg. The patient's wife states that they were told to put support stockings on him but they are concerned because they worsen the blisters. this bit difficult to get the history but they think the edema has been increasing over the last several months. 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 He does not complain of chest pain orthopnea. He does not have a history of kidney issues that he is aware. Primary doctor at [facility]. He is not on diuretics. He was here 2 years ago. He was noted to have edema in his legs at that time. He had an ultrasound of his legs which I'll need to review. There was discussion about compression stockings but he is complaining clearly not using them. ABI in the right in this clinic was 1.37, 1.07 on the left Wound History Patient presents with 3 open wounds that have been present for approximately 2 weeks. Patient has been treating wounds in the following manner: bandages. Laboratory tests have not been performed in the last month. Patient reportedly has not tested positive for an antibiotic resistant organism. Patient reportedly has not tested positive for osteomyelitis. Patient reportedly has had testing performed to evaluate circulation in the legs. Patient experiences the following problems associated with their wounds: swelling. Patient History Information obtained from  Patient. Allergies Placencia, Terrius C. ([id number]) No Known Drug Allergies Family History Cancer - Father, No family history of Diabetes, Heart Disease, Hypertension, Kidney Disease, Lung Disease, Seizures, Stroke, Thyroid Problems. Social History Never smoker, Marital Status - Married, Alcohol Use - Never, Drug Use - No History, Caffeine Use - Daily. Medical History Eyes Patient has history of Cataracts - years ago Denies history of Glaucoma, Optic Neuritis Ear/Nose/Mouth/Throat Denies history of Chronic sinus problems/congestion, Middle ear problems Hematologic/Lymphatic Denies history of Anemia, Hemophilia, Human Immunodeficiency Virus, Lymphedema, Sickle Cell Disease Respiratory Denies history of Aspiration, Asthma, Chronic Obstructive Pulmonary Disease (COPD), Pneumothorax, Sleep Apnea, Tuberculosis Cardiovascular Patient has history of Arrhythmia - a-fib, Deep Vein Thrombosis - both legs 2003 Denies history of Angina,  Congestive Heart Failure, Coronary Artery Disease, Hypertension, Hypotension, Myocardial Infarction, Peripheral Arterial Disease, Peripheral Venous Disease, Phlebitis, Vasculitis Gastrointestinal Denies history of Cirrhosis , Colitis, Crohn s, Hepatitis A, Hepatitis B, Hepatitis C Endocrine Denies history of Type I Diabetes, Type II Diabetes Genitourinary Denies history of End Stage Renal Disease Immunological Denies history of Lupus Erythematosus, Raynaud s, Scleroderma Integumentary (Skin) Denies history of History of Burn, History of pressure wounds Musculoskeletal Patient has history of Osteoarthritis Denies history of Gout, Rheumatoid Arthritis, Osteomyelitis Neurologic Denies history of Dementia, Neuropathy, Quadriplegia, Paraplegia, Seizure Disorder Oncologic Denies history of Received Chemotherapy, Received Radiation Psychiatric Denies history of Anorexia/bulimia, Confinement Anxiety Medical And Surgical History Notes Constitutional Symptoms  (General Health) A-fib; blood clots in legs; arthritis; leg, back and hip pain Review of Systems (ROS) Constitutional Symptoms (General Health) The patient has no complaints or symptoms. Ear/Nose/Mouth/Throat The patient has no complaints or symptoms. Hematologic/Lymphatic The patient has no complaints or symptoms. Respiratory The patient has no complaints or symptoms. Gastrointestinal KENDRICK, HAAPALA ([id number]) The patient has no complaints or symptoms. Genitourinary The patient has no complaints or symptoms. Immunological The patient has no complaints or symptoms. Integumentary (Skin) Complains or has symptoms of Wounds. Neurologic The patient has no complaints or symptoms. Oncologic The patient has no complaints or symptoms. Psychiatric The patient has no complaints or symptoms. Objective Constitutional Sitting or standing Blood Pressure is within target range for patient.. Patient is hypertensive.. Pulse regular and within target range for patient.Marland Kitchen Respirations regular, non-labored and within target range.. Temperature is normal and within the target range for the patient.Marland Kitchen appears in no distress.somewhat frail elderly man but alert and responsive. Vitals Time Taken: 8:11 AM, Height: 68 in, Source: Stated, Weight: 150 lbs, Source: Stated, BMI: 22.8, Temperature: 98.0 F, Pulse: 73 bpm, Respiratory Rate: 16 breaths/min, Blood Pressure: 141/85 mmHg. Eyes Conjunctivae clear. No discharge. Respiratory Respiratory effort is easy and symmetric bilaterally. Rate is normal at rest and on room air.. kyphotic. Shallow entry but no crackles or wheezes. Cardiovascular heart sounds are fairly regular there was no gallops no murmurs JVP is elevated to the angle of the jaw. femoral pulses palpable. peripheral pulses are palpable. Edema present in both extremities.this is worse on the left. Edema extends into both thigh areas posteriorly. The edema is pitting.. Gastrointestinal  (GI) Abdomen is soft and non-distended without masses or tenderness. Bowel sounds active in all quadrants.. No liver or spleen enlargement or tenderness.. Genitourinary (GU) there is no bladder distention. Lymphatic none palpable in the popliteal or inguinal area. Neurological some reduction in vibration sense and perhaps light touch. Psychiatric No evidence of depression, anxiety, or agitation. Calm, cooperative, and communicative. Appropriate interactions and affect.Jeanie Sewer, [name] ([id number]) General Notes: 11/11/17; there are very tiny weeping areas on both lower legs. Paradoxically this is worse on the right where there is weeping edema fluid on the right anterior right lateral and right medial lower legs. Only present on the left medial near the ankle. He has severe bilateral pitting edema. This does not look like lymphedema. Chronic venous inflammation with severe skin damage left greater than right. The pitting edema extends right up into his upper thighs posteriorly Integumentary (Hair, Skin) chronic venous insufficiency and inflammation in the left greater than right leg. The anterior left leg has chronic erythema but no tenderness. Wound #5 status is Open. Original cause of wound was Blister. The wound is located on the Left,Medial Malleolus. The wound measures 0.5cm length x 0.6cm width x 0.1cm depth; 0.236cm^2  area and 0.024cm^3 volume. There is no tunneling or undermining noted. There is a large amount of serous drainage noted. The wound margin is flat and intact. There is medium (34-66%) red granulation within the wound bed. There is a medium (34-66%) amount of necrotic tissue within the wound bed including Adherent Slough. The periwound skin appearance exhibited: Erythema. The surrounding wound skin color is noted with erythema which is circumferential. Periwound temperature was noted as No Abnormality. The periwound has tenderness on palpation. General Notes: Wound was  Irrigated with saline. Wound #6 status is Open. Original cause of wound was Blister. The wound is located on the Right,Medial Lower Leg. The wound measures 1.5cm length x 0.5cm width x 0.1cm depth; 0.589cm^2 area and 0.059cm^3 volume. There is no tunneling or undermining noted. There is a large amount of serous drainage noted. The wound margin is flat and intact. There is no granulation within the wound bed. There is a large (67-100%) amount of necrotic tissue within the wound bed including Adherent Slough. The periwound skin appearance exhibited: Erythema. The surrounding wound skin color is noted with erythema which is circumferential. Periwound temperature was noted as No Abnormality. The periwound has tenderness on palpation. General Notes: Wound was Irrigated with saline. Wound #7 status is Open. Original cause of wound was Gradually Appeared. The wound is located on the Right,Anterior Lower Leg. The wound measures 0.5cm length x 0.5cm width x 0.1cm depth; 0.196cm^2 area and 0.02cm^3 volume. There is no tunneling or undermining noted. There is a large amount of serous drainage noted. The wound margin is flat and intact. There is large (67-100%) pink granulation within the wound bed. There is no necrotic tissue within the wound bed. Periwound temperature was noted as No Abnormality. The periwound has tenderness on palpation. General Notes: Wound was Irrigated with saline. Wound #8 status is Open. Original cause of wound was Blister. The wound is located on the Right,Lateral Lower Leg. The wound measures 0.3cm length x 0.3cm width x 0.1cm depth; 0.071cm^2 area and 0.007cm^3 volume. There is no tunneling or undermining noted. There is a large amount of serous drainage noted. The wound margin is flat and intact. There is no granulation within the wound bed. There is a large (67-100%) amount of necrotic tissue within the wound bed including Adherent Slough. Periwound temperature was noted as No  Abnormality. The periwound has tenderness on palpation. General Notes: Wound was Irrigated with saline. Other Condition(s) Patient presents with Rash / Dermatitis located on the Right Foot. 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 ([id number]) Procedures Wound #5 Pre-procedure diagnosis of Wound #5 is a Venous Leg Ulcer located on the Left,Medial Malleolus .Severity of Tissue Pre Debridement is: Fat layer exposed. There was a Selective/Open Wound Non-Viable Tissue Debridement with a total area of 0.3 sq cm performed by Maxwell Caul, MD. With the following instrument(s): saline and gauze. to remove Non- Viable tissue/material Material removed includes Fibrin/Exudate after achieving pain control using Lidocaine 4% Topical Solution. No specimens were taken. A time out was conducted at 08:48, prior to the start of the procedure. A Minimum amount of bleeding was controlled with Pressure. The procedure was tolerated well with a pain level of 0 throughout and a pain level of 0 following the procedure. Post  Debridement Measurements: 0.5cm length x 0.6cm width x 0.1cm depth; 0.024cm^3 volume. Character of Wound/Ulcer Post Debridement is stable. Severity of Tissue Post Debridement is: Fat layer exposed. Post procedure Diagnosis Wound #5: Same as Pre-Procedure Pre-procedure diagnosis of Wound #5 is a Venous Leg Ulcer located on the Left,Medial Malleolus . There was a Three Layer Compression Therapy Procedure with a pre-treatment ABI of 1.3 by Huel Coventry, RN. Post procedure Diagnosis Wound #5: Same as Pre-Procedure Wound #6 Pre-procedure diagnosis of Wound #6 is a Venous Leg Ulcer located on the Right,Medial Lower Leg .  There was a Three Layer Compression Therapy Procedure with a pre-treatment ABI of 1.3 by Huel Coventry, RN. Post procedure Diagnosis Wound #6: Same as Pre-Procedure Wound #7 Pre-procedure diagnosis of Wound #7 is a Venous Leg Ulcer located on the Right,Anterior Lower Leg . There was a Three Layer Compression Therapy Procedure with a pre-treatment ABI of 1.3 by Huel Coventry, RN. Post procedure Diagnosis Wound #7: Same as Pre-Procedure Wound #8 Pre-procedure diagnosis of Wound #8 is a Venous Leg Ulcer located on the Right,Lateral Lower Leg . There was a Three Layer Compression Therapy Procedure with a pre-treatment ABI of 1.3 by Huel Coventry, RN. Post procedure Diagnosis Wound #8: Same as Pre-Procedure Plan Wound Cleansing: Wound #5 Left,Medial Malleolus: Cleanse wound with mild soap and water Wound #6 Right,Medial Lower Leg: Cleanse wound with mild soap and water Wound #7 Right,Anterior Lower Leg: Cleanse wound with mild soap and water Wound #8 Right,Lateral Lower Leg: Cleanse wound with mild soap and water Skin Barriers/Peri-Wound Care: Wound #5 Left,Medial Malleolus: Torre, Bronson C. ([id number]) Moisturizing lotion Other: - OTC Lotrimin Cream on top of right foot. Wound #6 Right,Medial Lower Leg: Moisturizing lotion Wound #7 Right,Anterior Lower Leg: Moisturizing lotion Wound #8 Right,Lateral Lower Leg: Moisturizing lotion Primary Wound Dressing: Wound #5 Left,Medial Malleolus: [place]cel Non-Adherent Wound #6 Right,Medial Lower Leg: [place] Non-Adherent Wound #7 Right,Anterior Lower Leg: [place] Non-Adherent Wound #8 Right,Lateral Lower Leg: [place] Non-Adherent Secondary Dressing: Wound #5 Left,Medial Malleolus: ABD pad Wound #6 Right,Medial Lower Leg: ABD pad Wound #7 Right,Anterior Lower Leg: ABD pad Wound #8 Right,Lateral Lower Leg: ABD pad Dressing Change Frequency: Wound #5 Left,Medial Malleolus: Change Dressing Monday, Wednesday, Friday Wound #6  Right,Medial Lower Leg: Change Dressing Monday, Wednesday, Friday Wound #7 Right,Anterior Lower Leg: Change Dressing Monday, Wednesday, Friday Wound #8 Right,Lateral Lower Leg: Change Dressing Monday, Wednesday, Friday Follow-up Appointments: Wound #5 Left,Medial Malleolus: Return Appointment in 1 week. Wound #6 Right,Medial Lower Leg: Return Appointment in 1 week. Wound #7 Right,Anterior Lower Leg: Return Appointment in 1 week. Wound #8 Right,Lateral Lower Leg: Return Appointment in 1 week. Edema Control: Wound #5 Left,Medial Malleolus: 3 Layer Compression System - Bilateral Elevate legs to the level of the heart and pump ankles as often as possible Wound #6 Right,Medial Lower Leg: 3 Layer Compression System - Bilateral Elevate legs to the level of the heart and pump ankles as often as possible Wound #7 Right,Anterior Lower Leg: 3 Layer Compression System - Bilateral Elevate legs to the level of the heart and pump ankles as often as possible Wound #8 Right,Lateral Lower Leg: 3 Layer Compression System - Bilateral Elevate legs to the level of the heart and pump ankles as often as possible Additional Orders / Instructions: Wound #5 Left,Medial Malleolus: Vitamin A; Vitamin C, Zinc Krejci, Boyce C. ([id number]) Activity as tolerated Wound #6 Right,Medial Lower Leg: Vitamin A; Vitamin C, Zinc Activity as tolerated Wound #7 Right,Anterior Lower Leg: Vitamin A; Vitamin C, Zinc Activity  as tolerated Wound #8 Right,Lateral Lower Leg: Vitamin A; Vitamin C, Zinc Activity as tolerated Home Health: Wound #5 Left,Medial Malleolus: Initiate Home Health for Skilled Nursing Home Health Nurse may visit PRN to address patient s wound care needs. FACE TO FACE ENCOUNTER: MEDICARE and MEDICAID PATIENTS: I certify that this patient is under my care and that I had a face-to-face encounter that meets the physician face-to-face encounter requirements with this patient on this date.  The encounter with the patient was in whole or in part for the following MEDICAL CONDITION: (primary reason for Home Healthcare) MEDICAL NECESSITY: I certify, that based on my findings, NURSING services are a medically necessary home health service. HOME BOUND STATUS: I certify that my clinical findings support that this patient is homebound (i.e., Due to illness or injury, pt requires aid of supportive devices such as crutches, cane, wheelchairs, walkers, the use of special transportation or the assistance of another person to leave their place of residence. There is a normal inability to leave the home and doing so requires considerable and taxing effort. Other absences are for medical reasons / religious services and are infrequent or of short duration when for other reasons). If current dressing causes regression in wound condition, may D/C ordered dressing product/s and apply Normal Saline Moist Dressing daily until next Wound Healing Center / Other MD appointment. Notify Wound Healing Center of regression in wound condition at [phone]. Please direct any NON-WOUND related issues/requests for orders to patient's Primary Care Physician Wound #6 Right,Medial Lower Leg: Initiate Home Health for Skilled Nursing Home Health Nurse may visit PRN to address patient s wound care needs. FACE TO FACE ENCOUNTER: MEDICARE and MEDICAID PATIENTS: I certify that this patient is under my care and that I had a face-to-face encounter that meets the physician face-to-face encounter requirements with this patient on this date. The encounter with the patient was in whole or in part for the following MEDICAL CONDITION: (primary reason for Home Healthcare) MEDICAL NECESSITY: I certify, that based on my findings, NURSING services are a medically necessary home health service. HOME BOUND STATUS: I certify that my clinical findings support that this patient is homebound (i.e., Due to illness or injury, pt requires  aid of supportive devices such as crutches, cane, wheelchairs, walkers, the use of special transportation or the assistance of another person to leave their place of residence. There is a normal inability to leave the home and doing so requires considerable and taxing effort. Other absences are for medical reasons / religious services and are infrequent or of short duration when for other reasons). If current dressing causes regression in wound condition, may D/C ordered dressing product/s and apply Normal Saline Moist Dressing daily until next Wound Healing Center / Other MD appointment. Notify Wound Healing Center of regression in wound condition at [phone]. Please direct any NON-WOUND related issues/requests for orders to patient's Primary Care Physician Wound #7 Right,Anterior Lower Leg: Initiate Home Health for Skilled Nursing Home Health Nurse may visit PRN to address patient s wound care needs. FACE TO FACE ENCOUNTER: MEDICARE and MEDICAID PATIENTS: I certify that this patient is under my care and that I had a face-to-face encounter that meets the physician face-to-face encounter requirements with this patient on this date. The encounter with the patient was in whole or in part for the following MEDICAL CONDITION: (primary reason for Home Healthcare) MEDICAL NECESSITY: I certify, that based on my findings, NURSING services are a medically necessary home health service. HOME BOUND  STATUS: I certify that my clinical findings support that this patient is homebound (i.e., Due to illness or injury, pt requires aid of supportive devices such as crutches, cane, wheelchairs, walkers, the use of special transportation or the assistance of another person to leave their place of residence. There is a normal inability to leave the home and doing so requires considerable and taxing effort. Other absences are for medical reasons / religious services and are infrequent or of short duration when for  other reasons). If current dressing causes regression in wound condition, may D/C ordered dressing product/s and apply Normal Saline Moist Dressing daily until next Wound Healing Center / Other MD appointment. Notify Wound Healing Center of regression in wound condition at [phone]. Please direct any NON-WOUND related issues/requests for orders to patient's Primary Care Physician Wound #8 Right,Lateral Lower Leg: BEACHER, EVERY ([id number]) Initiate Home Health for Skilled Nursing Home Health Nurse may visit PRN to address patient s wound care needs. FACE TO FACE ENCOUNTER: MEDICARE and MEDICAID PATIENTS: I certify that this patient is under my care and that I had a face-to-face encounter that meets the physician face-to-face encounter requirements with this patient on this date. The encounter with the patient was in whole or in part for the following MEDICAL CONDITION: (primary reason for Home Healthcare) MEDICAL NECESSITY: I certify, that based on my findings, NURSING services are a medically necessary home health service. HOME BOUND STATUS: I certify that my clinical findings support that this patient is homebound (i.e., Due to illness or injury, pt requires aid of supportive devices such as crutches, cane, wheelchairs, walkers, the use of special transportation or the assistance of another person to leave their place of residence. There is a normal inability to leave the home and doing so requires considerable and taxing effort. Other absences are for medical reasons / religious services and are infrequent or of short duration when for other reasons). If current dressing causes regression in wound condition, may D/C ordered dressing product/s and apply Normal Saline Moist Dressing daily until next Wound Healing Center / Other MD appointment. Notify Wound Healing Center of regression in wound condition at [phone]. Please direct any NON-WOUND related issues/requests for orders to  patient's Primary Care Physician #1 we dressed all the leaking areas with calcium alginate secondary absorption with Cara Maxson ABDs #23K compression although I think he would tolerate 4 #3 I think this patient has severe bilateral chronic venous insufficiency although I think he is also fluid overloaded i.e. elevated jugular venous pressure, 1-2+ coccyx pitting edema. I've advised him to go back to see [facility] about this I wonder whether he needs a cardiac echo if he hasn't had one recently #4 although he is noncompressible on the right I don't think he has an arterial issue here and I had no qualms with a 3 layer compression #5 the patient is going to need ongoing compression stockings perhaps juxta light stockings or similar as they will have trouble getting on standard compression #6 the patient is frail. We'll get home health and the change the dressings twice before we see him next time. I'm hopeful this will control the edema sufficiently to allow closure of the weeping open areas and not result in anything more ominous in terms of wounds on his lower extremities #7 he has a raised but not open area on the left right dorsal foot just proximal to the toes I thought initially this might be a scarred healed wound but there  is no such history. His wife says this is been expanding for many months. This doesn't look particularly malignant I wondered about tinea and adjusted twice a day Lotrimin to see if that's the issue although it is asymptomatic i.e. not pruritic Electronic Signature(s) Signed: 12/02/2017 5:02:33 PM By: Elliot Gurney, [name], RN, CWS, [name], [name] Signed: 12/03/2017 8:16:15 AM By: Baltazar Najjar MD Previous Signature: 11/18/2017 4:00:03 PM Version By: Elliot Gurney [name], RN, CWS, [name], [name] Previous Signature: 11/18/2017 5:42:24 PM Version By: Baltazar Najjar MD Previous Signature: 11/11/2017 5:10:26 PM Version By: Baltazar Najjar MD Entered By: Elliot Gurney, [name], RN, CWS, Kim on  12/02/2017 17:02:33 [name] ([id number]) -------------------------------------------------------------------------------- ROS/PFSH Details Patient Name: [name], [name]. Date of Service: 11/11/2017 8:00 AM Medical Record Number: [id number] Patient Account Number: [id number] Date of Birth/Sex: [birth year] (84 y.o. M) Treating RN: Phillis Haggis Primary Care Provider: Guerry Bruin Other Clinician: Referring Provider: Guerry Bruin Treating Provider/Extender: Altamese [place] in Treatment: 0 Label Progress Note Print Version as History and Physical for this encounter Information Obtained From Patient Wound History Do you currently have one or more open woundso Yes How many open wounds do you currently haveo 3 Approximately how long have you had your woundso 2 weeks How have you been treating your wound(s) until nowo bandages Has your wound(s) ever healed and then re-openedo No Have you had any lab work done in the past montho No Have you tested positive for an antibiotic resistant organism (MRSA, VRE)o No Have you tested positive for osteomyelitis (bone infection)o No Have you had any tests for circulation on your legso Yes Who ordered the testo [facility] Where was the test doneo 2014 Have you had other problems associated with your woundso Swelling Integumentary (Skin) Complaints and Symptoms: Positive for: Wounds Medical History: Negative for: History of Burn; History of pressure wounds Constitutional Symptoms (General Health) Complaints and Symptoms: No Complaints or Symptoms Medical History: Past Medical History Notes: A-fib; blood clots in legs; arthritis; leg, back and hip pain Eyes Medical History: Positive for: Cataracts - years ago Negative for: Glaucoma; Optic Neuritis Ear/Nose/Mouth/Throat Complaints and Symptoms: No Complaints or Symptoms Medical History: Negative for: Chronic sinus problems/congestion; Middle ear problems Mcfayden, Dewaun  C. ([id number]) Hematologic/Lymphatic Complaints and Symptoms: No Complaints or Symptoms Medical History: Negative for: Anemia; Hemophilia; Human Immunodeficiency Virus; Lymphedema; Sickle Cell Disease Respiratory Complaints and Symptoms: No Complaints or Symptoms Medical History: Negative for: Aspiration; Asthma; Chronic Obstructive Pulmonary Disease (COPD); Pneumothorax; Sleep Apnea; Tuberculosis Cardiovascular Medical History: Positive for: Arrhythmia - a-fib; Deep Vein Thrombosis - both legs 2003 Negative for: Angina; Congestive Heart Failure; Coronary Artery Disease; Hypertension; Hypotension; Myocardial Infarction; Peripheral Arterial Disease; Peripheral Venous Disease; Phlebitis; Vasculitis Gastrointestinal Complaints and Symptoms: No Complaints or Symptoms Medical History: Negative for: Cirrhosis ; Colitis; Crohnos; Hepatitis A; Hepatitis B; Hepatitis C Endocrine Medical History: Negative for: Type I Diabetes; Type II Diabetes Genitourinary Complaints and Symptoms: No Complaints or Symptoms Medical History: Negative for: End Stage Renal Disease Immunological Complaints and Symptoms: No Complaints or Symptoms Medical History: Negative for: Lupus Erythematosus; Raynaudos; Scleroderma Musculoskeletal Medical History: Positive for: Osteoarthritis Negative for: Gout; Rheumatoid Arthritis; Osteomyelitis Neurologic Drew, Jonothan C. ([id number]) Complaints and Symptoms: No Complaints or Symptoms Medical History: Negative for: Dementia; Neuropathy; Quadriplegia; Paraplegia; Seizure Disorder Oncologic Complaints and Symptoms: No Complaints or Symptoms Medical History: Negative for: Received Chemotherapy; Received Radiation Psychiatric Complaints and Symptoms: No Complaints or Symptoms Medical History: Negative for: Anorexia/bulimia; Confinement Anxiety HBO Extended History Items Eyes: Cataracts Immunizations Pneumococcal Vaccine:  Received Pneumococcal  Vaccination: No Implantable Devices Family and Social History Cancer: Yes - Father; Diabetes: No; Heart Disease: No; Hypertension: No; Kidney Disease: No; Lung Disease: No; Seizures: No; Stroke: No; Thyroid Problems: No; Never smoker; Marital Status - Married; Alcohol Use: Never; Drug Use: No History; Caffeine Use: Daily; Financial Concerns: No; Food, Clothing or Shelter Needs: No; Support System Lacking: No; Transportation Concerns: No; Advanced Directives: No; Patient does not want information on Advanced Directives; Do not resuscitate: No; Living Will: No; Medical Power of Attorney: No Electronic Signature(s) Signed: 11/11/2017 4:15:20 PM By: Alejandro MullingPinkerton, Debra Signed: 11/11/2017 5:10:26 PM By: Baltazar Najjarobson, Michael MD Entered By: Alejandro MullingPinkerton, Debra on 11/11/2017 08:18:10 Donald MouseWILLIFORD, Harvy C. ([id number]) -------------------------------------------------------------------------------- SuperBill Details Patient Name: Donald MuscaWILLIFORD, Jahkeem C. Date of Service: 11/11/2017 Medical Record Number: [id number] Patient Account Number: [id number] Date of Birth/Sex: 08/24/1929 (84 y.o. 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 Physician Procedures CPT4: Description Modifier Quantity Code [id number] 99214 - WC PHYS LEVEL 4 - EST PT 1  ICD-10 Diagnosis Description L97.211 Non-pressure chronic ulcer of right calf limited to breakdown of skin L97.221 Non-pressure chronic ulcer of left calf limited to  breakdown of skin I87.333 Chronic venous hypertension (idiopathic) with ulcer and inflammation of bilateral lower extremity Electronic Signature(s) Signed: 11/11/2017 5:10:26 PM By: Baltazar Najjarobson, Michael MD Entered By: Baltazar Najjarobson, Michael on 11/11/2017 10:06:40

## 2017-11-12 NOTE — Progress Notes (Signed)
JAHMEZ, BILY ([id number]) Visit Report for 11/11/2017 Abuse/Suicide Risk Screen Details Patient Name: [name], [name]. Date of Service: 11/11/2017 8:00 AM Medical Record Number: [id number] Patient Account Number: [id number] Date of Birth/Sex: Nov 16, 1928 (84 y.o. Male) Treating RN: Phillis Haggis Primary Care Meiko Ives: Guerry Bruin Other Clinician: Referring Nilton Lave: Referral, Self Treating Kramer Hanrahan/Extender: Maxwell Caul Weeks in Treatment: 0 Abuse/Suicide Risk Screen Items Answer ABUSE/SUICIDE RISK SCREEN: Has anyone close to you tried to hurt or harm you recentlyo No Do you feel uncomfortable with anyone in your familyo No Has anyone forced you do things that you didnot want to doo No Do you have any thoughts of harming yourselfo No Patient displays signs or symptoms of abuse and/or neglect. No Electronic Signature(s) Signed: 11/11/2017 4:15:20 PM By: Alejandro Mulling Entered By: Alejandro Mulling on 11/11/2017 08:18:21 [name] ([id number]) -------------------------------------------------------------------------------- Activities of Daily Living Details Patient Name: [name], [name] Date of Service: 11/11/2017 8:00 AM Medical Record Number: [id number] Patient Account Number: [id number] Date of Birth/Sex: 1929/07/15 (84 y.o. Male) Treating RN: Phillis Haggis Primary Care Shamikia Linskey: Guerry Bruin Other Clinician: Referring Zamira Hickam: Referral, Self Treating Jamariyah Johannsen/Extender: Maxwell Caul Weeks in Treatment: 0 Activities of Daily Living Items Answer Activities of Daily Living (Please select one for each item) Drive Automobile Not Able Take Medications Need Assistance Use Telephone Need Assistance Care for Appearance Need Assistance Use Toilet Need Assistance Bath / Shower Need Assistance Dress Self Need Assistance Feed Self Completely Able Walk Need Assistance Get In / Out Bed Need Assistance Housework Not Able Prepare Meals Not  Able Handle Money Not Able Shop for Self Not Able Electronic Signature(s) Signed: 11/11/2017 4:15:20 PM By: Alejandro Mulling Entered By: Alejandro Mulling on 11/11/2017 08:19:01 [name] ([id number]) -------------------------------------------------------------------------------- Education Assessment Details Patient Name: [name]. Date of Service: 11/11/2017 8:00 AM Medical Record Number: [id number] Patient Account Number: [id number] Date of Birth/Sex: December 06, 1928 (84 y.o. Male) Treating RN: Phillis Haggis Primary Care Birttany Dechellis: Guerry Bruin Other Clinician: Referring Rashawna Scoles: Referral, Self Treating Marifer Hurd/Extender: Altamese [place] in Treatment: 0 Primary Learner Assessed: Patient Learning Preferences/Education Level/Primary Language Learning Preference: Explanation, Printed Material Highest Education Level: College or Above Preferred Language: English Cognitive Barrier Assessment/Beliefs Language Barrier: No Translator Needed: No Memory Deficit: No Emotional Barrier: No Cultural/Religious Beliefs Affecting Medical Care: No Physical Barrier Assessment Impaired Vision: Yes Glasses Impaired Hearing: No Decreased Hand dexterity: No Knowledge/Comprehension Assessment Knowledge Level: Medium Comprehension Level: Medium Ability to understand written Medium instructions: Ability to understand verbal Medium instructions: Motivation Assessment Anxiety Level: Calm Cooperation: Cooperative Education Importance: Acknowledges Need Interest in Health Problems: Asks Questions Perception: Coherent Willingness to Engage in Self- Medium Management Activities: Readiness to Engage in Self- Medium Management Activities: Electronic Signature(s) Signed: 11/11/2017 4:15:20 PM By: Alejandro Mulling Entered By: Alejandro Mulling on 11/11/2017 08:19:45 [name]  ([id number]) -------------------------------------------------------------------------------- Fall Risk Assessment Details Patient Name: [name]. Date of Service: 11/11/2017 8:00 AM Medical Record Number: [id number] Patient Account Number: [id number] Date of Birth/Sex: 01-23-1929 (84 y.o. Male) Treating RN: Phillis Haggis Primary Care Delonna Ney: Guerry Bruin Other Clinician: Referring Donyel Castagnola: Referral, Self Treating Sameera Betton/Extender: Maxwell Caul Weeks in Treatment: 0 Fall Risk Assessment Items Have you had 2 or more falls in the last 12 monthso 0 No Have you had any fall that resulted in injury in the last 12 monthso 0 No FALL RISK ASSESSMENT: History of falling - immediate or within 3 months 25 Yes Secondary diagnosis 15 Yes Ambulatory aid None/bed rest/wheelchair/nurse 0 Yes Crutches/cane/walker 15  Yes Furniture 0 No IV Access/Saline Lock 0 No Gait/Training Normal/bed rest/immobile 0 No Weak 10 Yes Impaired 20 Yes Mental Status Oriented to own ability 0 Yes Electronic Signature(s) Signed: 11/11/2017 4:15:20 PM By: Alejandro MullingPinkerton, Debra Entered By: Alejandro MullingPinkerton, Debra on 11/11/2017 08:20:30 Donald MouseWILLIFORD, [name] ([id number]) -------------------------------------------------------------------------------- Foot Assessment Details Patient Name: [name], [name] Date of Service: 11/11/2017 8:00 AM Medical Record Number: [id number] Patient Account Number: [id number] Date of Birth/Sex: 03/27/1929 [age] y.o. Male) Treating RN: Phillis HaggisPinkerton, Debi Primary Care Clarie Camey: Guerry BruinISOVEC, RICHARD Other Clinician: Referring Elynor Kallenberger: Referral, Self Treating Sherman Lipuma/Extender: Maxwell CaulOBSON, MICHAEL G Weeks in Treatment: 0 Foot Assessment Items Site Locations + = Sensation present, - = Sensation absent, C = Callus, U = Ulcer R = Redness, W = Warmth, M = Maceration, PU = Pre-ulcerative lesion F = Fissure, S = Swelling, D = Dryness Assessment Right: Left: Other Deformity: No No Prior Foot  Ulcer: No No Prior Amputation: No No Charcot Joint: No No Ambulatory Status: Ambulatory With Help Assistance Device: Walker Gait: Surveyor, miningUnsteady Electronic Signature(s) Signed: 11/11/2017 4:15:20 PM By: Alejandro MullingPinkerton, Debra Entered By: Alejandro MullingPinkerton, Debra on 11/11/2017 08:21:47 Donald MouseWILLIFORD, [name] ([id number]) -------------------------------------------------------------------------------- Nutrition Risk Assessment Details Patient Name: [name], [name] Date of Service: 11/11/2017 8:00 AM Medical Record Number: [id number] Patient Account Number: [id number] Date of Birth/Sex: 07/23/1929 [age] y.o. Male) Treating RN: Phillis HaggisPinkerton, Debi Primary Care Keymari Sato: Guerry BruinISOVEC, RICHARD Other Clinician: Referring Prentiss Hammett: Referral, Self Treating Belton Peplinski/Extender: Maxwell CaulOBSON, MICHAEL G Weeks in Treatment: 0 Height (in): 68 Weight (lbs): 150 Body Mass Index (BMI): 22.8 Nutrition Risk Assessment Items NUTRITION RISK SCREEN: I have an illness or condition that made me change the kind and/or amount of 0 No food I eat I eat fewer than two meals per day 0 No I eat few fruits and vegetables, or milk products 0 No I have three or more drinks of beer, liquor or wine almost every day 0 No I have tooth or mouth problems that make it hard for me to eat 0 No I don't always have enough money to buy the food I need 0 No I eat alone most of the time 0 No I take three or more different prescribed or over-the-counter drugs a day 1 Yes Without wanting to, I have lost or gained 10 pounds in the last six months 0 No I am not always physically able to shop, cook and/or feed myself 0 No Nutrition Protocols Good Risk Protocol Moderate Risk Protocol Electronic Signature(s) Signed: 11/11/2017 4:15:20 PM By: Alejandro MullingPinkerton, Debra Entered By: Alejandro MullingPinkerton, Debra on 11/11/2017 08:20:41

## 2017-11-13 DIAGNOSIS — I4891 Unspecified atrial fibrillation: Secondary | ICD-10-CM | POA: Diagnosis not present

## 2017-11-13 DIAGNOSIS — I87333 Chronic venous hypertension (idiopathic) with ulcer and inflammation of bilateral lower extremity: Secondary | ICD-10-CM | POA: Diagnosis not present

## 2017-11-13 DIAGNOSIS — M1991 Primary osteoarthritis, unspecified site: Secondary | ICD-10-CM | POA: Diagnosis not present

## 2017-11-13 DIAGNOSIS — I10 Essential (primary) hypertension: Secondary | ICD-10-CM | POA: Diagnosis not present

## 2017-11-13 DIAGNOSIS — L97221 Non-pressure chronic ulcer of left calf limited to breakdown of skin: Secondary | ICD-10-CM | POA: Diagnosis not present

## 2017-11-13 DIAGNOSIS — L97321 Non-pressure chronic ulcer of left ankle limited to breakdown of skin: Secondary | ICD-10-CM | POA: Diagnosis not present

## 2017-11-13 DIAGNOSIS — Z86718 Personal history of other venous thrombosis and embolism: Secondary | ICD-10-CM | POA: Diagnosis not present

## 2017-11-13 DIAGNOSIS — L97211 Non-pressure chronic ulcer of right calf limited to breakdown of skin: Secondary | ICD-10-CM | POA: Diagnosis not present

## 2017-11-13 NOTE — Progress Notes (Signed)
KARLIS, CREGG ([id number]) Visit Report for 11/11/2017 Allergy List Details Patient Name: [name]. Date of Service: 11/11/2017 8:00 AM Medical Record Number: [id number] Patient Account Number: [id number] Date of Birth/Sex: [birth year] (84 y.o. Male) Treating RN: Phillis Haggis Primary Care Anika Shore: Guerry Bruin Other Clinician: Referring Rhilyn Battle: Referral, Self Treating Trace Cederberg/Extender: Maxwell Caul Weeks in Treatment: 0 Allergies Active Allergies No Known Drug Allergies Allergy Notes Electronic Signature(s) Signed: 11/11/2017 4:15:20 PM By: Alejandro Mulling Entered By: Alejandro Mulling on 11/11/2017 08:12:03 [name] ([id number]) -------------------------------------------------------------------------------- Arrival Information Details Patient Name: [name] C. Date of Service: 11/11/2017 8:00 AM Medical Record Number: [id number] Patient Account Number: [id number] Date of Birth/Sex: [birth year] (84 y.o. Male) Treating RN: Phillis Haggis Primary Care Omar Gayden: Guerry Bruin Other Clinician: Referring Blaine Hari: Referral, Self Treating Kenzy Campoverde/Extender: Altamese Ransom in Treatment: 0 Visit Information Patient Arrived: Wheel Chair Arrival Time: 08:09 Accompanied By: wife Transfer Assistance: EasyPivot Patient Lift Patient Identification Verified: Yes Secondary Verification Process Yes Completed: Patient Requires Transmission-Based No Precautions: Patient Has Alerts: Yes Patient Alerts: Patient on Blood Thinner Warfarin Pacemaker History Since Last Visit Electronic Signature(s) Signed: 11/11/2017 8:57:43 AM By: Alejandro Mulling Entered By: Alejandro Mulling on 11/11/2017 08:57:43 Arbaugh, [name] ([id number]) -------------------------------------------------------------------------------- Clinic Level of Care Assessment Details Patient Name: [name]. Date of Service: 11/11/2017 8:00 AM Medical Record Number:  [id number] Patient Account Number: [id number] Date of Birth/Sex: 01-31-1929 (84 y.o. Male) Treating RN: Huel Coventry Primary Care Nanea Jared: Guerry Bruin Other Clinician: Referring Damarri Rampy: Referral, Self Treating Jervis Trapani/Extender: Altamese [place] in Treatment: 0 Clinic Level of Care Assessment Items TOOL 1 Quantity Score []  - Use when EandM and Procedure is performed on INITIAL visit 0 ASSESSMENTS - Nursing Assessment / Reassessment X - General Physical Exam (combine w/ comprehensive assessment (listed just below) when 1 20 performed on new pt. evals) X- 1 25 Comprehensive Assessment (HX, ROS, Risk Assessments, Wounds Hx, etc.) ASSESSMENTS - Wound and Skin Assessment / Reassessment []  - Dermatologic / Skin Assessment (not related to wound area) 0 ASSESSMENTS - Ostomy and/or Continence Assessment and Care []  - Incontinence Assessment and Management 0 []  - 0 Ostomy Care Assessment and Management (repouching, etc.) PROCESS - Coordination of Care X - Simple Patient / Family Education for ongoing care 1 15 []  - 0 Complex (extensive) Patient / Family Education for ongoing care X- 1 10 Staff obtains Chiropractor, Records, Test Results / Process Orders []  - 0 Staff telephones HHA, Nursing Homes / Clarify orders / etc []  - 0 Routine Transfer to another Facility (non-emergent condition) []  - 0 Routine Hospital Admission (non-emergent condition) X- 1 15 New Admissions / Manufacturing engineer / Ordering NPWT, Apligraf, etc. []  - 0 Emergency Hospital Admission (emergent condition) PROCESS - Special Needs []  - Pediatric / Minor Patient Management 0 []  - 0 Isolation Patient Management []  - 0 Hearing / Language / Visual special needs []  - 0 Assessment of Community assistance (transportation, D/C planning, etc.) []  - 0 Additional assistance / Altered mentation []  - 0 Support Surface(s) Assessment (bed, cushion, seat, etc.) Campoverde, Bela C. ([id number]) INTERVENTIONS -  Miscellaneous []  - External ear exam 0 []  - 0 Patient Transfer (multiple staff / Nurse, adult / Similar devices) []  - 0 Simple Staple / Suture removal (25 or less) []  - 0 Complex Staple / Suture removal (26 or more) []  - 0 Hypo/Hyperglycemic Management (do not check if billed separately) X- 1 15 Ankle / Brachial Index (ABI) - do not check if billed separately  Has the patient been seen at the hospital within the last three years: Yes Total Score: 100 Level Of Care: New/Established - Level 3 Electronic Signature(s) Signed: 11/11/2017 4:24:03 PM By: Elliot Gurney, [name], RN, CWS, [name], [name] Entered By: Elliot Gurney, [name], RN, CWS, Kim on 11/11/2017 08:58:30 [name] ([id number]) -------------------------------------------------------------------------------- Compression Therapy Details Patient Name: [name] C. Date of Service: 11/11/2017 8:00 AM Medical Record Number: [id number] Patient Account Number: [id number] Date of Birth/Sex: December 25, 1928 (84 y.o. Male) Treating RN: Huel Coventry Primary Care Royale Swamy: Guerry Bruin Other Clinician: Referring Landyn Buckalew: Referral, Self Treating Jamarie Joplin/Extender: Maxwell Caul Weeks in Treatment: 0 Compression Therapy Performed for Wound Assessment: Wound #6 Right,Medial Lower Leg Performed By: Clinician Huel Coventry, RN Compression Type: Three Layer Pre Treatment ABI: 1.3 Post Procedure Diagnosis Same as Pre-procedure Electronic Signature(s) Signed: 11/11/2017 4:24:03 PM By: Elliot Gurney, [name], RN, CWS, [name], [name] Entered By: Elliot Gurney, [name], RN, CWS, Kim on 11/11/2017 08:57:05 [name] ([id number]) -------------------------------------------------------------------------------- Compression Therapy Details Patient Name: [name] C. Date of Service: 11/11/2017 8:00 AM Medical Record Number: [id number] Patient Account Number: [id number] Date of Birth/Sex: Jan 16, 1929 (84 y.o. Male) Treating RN: Huel Coventry Primary Care Tomi Grandpre: Guerry Bruin Other Clinician: Referring Alyra Patty: Referral, Self Treating Jesselee Poth/Extender: Maxwell Caul Weeks in Treatment: 0 Compression Therapy Performed for Wound Assessment: Wound #5 Left,Medial Malleolus Performed By: Clinician Huel Coventry, RN Compression Type: Three Layer Pre Treatment ABI: 1.3 Post Procedure Diagnosis Same as Pre-procedure Electronic Signature(s) Signed: 11/11/2017 4:24:03 PM By: Elliot Gurney, [name], RN, CWS, [name], [name] Entered By: Elliot Gurney, [name], RN, CWS, Kim on 11/11/2017 08:57:05 [name] ([id number]) -------------------------------------------------------------------------------- Compression Therapy Details Patient Name: [name] C. Date of Service: 11/11/2017 8:00 AM Medical Record Number: [id number] Patient Account Number: [id number] Date of Birth/Sex: 1929/08/23 (83 y.o. Male) Treating RN: Huel Coventry Primary Care Telina Kleckley: Guerry Bruin Other Clinician: Referring Ariyon Mittleman: Referral, Self Treating Korion Cuevas/Extender: Maxwell Caul Weeks in Treatment: 0 Compression Therapy Performed for Wound Assessment: Wound #7 Right,Anterior Lower Leg Performed By: Clinician Huel Coventry, RN Compression Type: Three Layer Pre Treatment ABI: 1.3 Post Procedure Diagnosis Same as Pre-procedure Electronic Signature(s) Signed: 11/11/2017 4:24:03 PM By: Elliot Gurney, [name], RN, CWS, [name], [name] Entered By: Elliot Gurney, [name], RN, CWS, Kim on 11/11/2017 08:57:05 [name] ([id number]) -------------------------------------------------------------------------------- Compression Therapy Details Patient Name: [name] C. Date of Service: 11/11/2017 8:00 AM Medical Record Number: [id number] Patient Account Number: [id number] Date of Birth/Sex: 1929/07/03 (84 y.o. Male) Treating RN: Huel Coventry Primary Care Siedah Sedor: Guerry Bruin Other Clinician: Referring Avea Mcgowen: Referral, Self Treating Jonanthan Bolender/Extender: Maxwell Caul Weeks in Treatment: 0 Compression Therapy  Performed for Wound Assessment: Wound #8 Right,Lateral Lower Leg Performed By: Clinician Huel Coventry, RN Compression Type: Three Layer Pre Treatment ABI: 1.3 Post Procedure Diagnosis Same as Pre-procedure Electronic Signature(s) Signed: 11/11/2017 4:24:03 PM By: Elliot Gurney, [name], RN, CWS, [name], [name] Entered By: Elliot Gurney, [name], RN, CWS, Kim on 11/11/2017 08:57:05 [name] ([id number]) -------------------------------------------------------------------------------- Encounter Discharge Information Details Patient Name: [name] C. Date of Service: 11/11/2017 8:00 AM Medical Record Number: [id number] Patient Account Number: [id number] Date of Birth/Sex: 25-Feb-1929 (85 y.o. Male) Treating RN: Curtis Sites Primary Care Kaylamarie Swickard: Guerry Bruin Other Clinician: Referring Judd Mccubbin: Referral, Self Treating Whittney Steenson/Extender: Altamese Colwell in Treatment: 0 Encounter Discharge Information Items Discharge Pain Level: 0 Discharge Condition: Stable Ambulatory Status: Wheelchair Discharge Destination: Home Transportation: Private Auto Accompanied By: spouse Schedule Follow-up Appointment: Yes Medication Reconciliation completed and No provided to Patient/Care Royelle Hinchman: Provided on Clinical Summary  of Care: 11/11/2017 Form Type Recipient Paper Patient RW Electronic Signature(s) Signed: 11/12/2017 11:42:25 AM By: Gwenlyn Perking Previous Signature: 11/11/2017 9:32:41 AM Version By: Curtis Sites Entered By: Gwenlyn Perking on 11/11/2017 09:34:00 [name] ([id number]) -------------------------------------------------------------------------------- Lower Extremity Assessment Details Patient Name: [name] C. Date of Service: 11/11/2017 8:00 AM Medical Record Number: [id number] Patient Account Number: [id number] Date of Birth/Sex: 1928/09/18 (84 y.o. Male) Treating RN: Phillis Haggis Primary Care Taj Nevins: Guerry Bruin Other Clinician: Referring Niel Peretti:  Referral, Self Treating Kenshawn Maciolek/Extender: Maxwell Caul Weeks in Treatment: 0 Edema Assessment Assessed: [Left: No] [Right: No] Edema: [Left: Yes] [Right: Yes] Calf Left: Right: Point of Measurement: 33 cm From Medial Instep 39.5 cm 36.8 cm Ankle Left: Right: Point of Measurement: 12 cm From Medial Instep 22.5 cm 25.7 cm Vascular Assessment Pulses: Dorsalis Pedis Palpable: [Left:No] [Right:No] Doppler Audible: [Left:Yes] [Right:Yes] Posterior Tibial Palpable: [Right:Yes] Extremity colors, hair growth, and conditions: Extremity Color: [Left:Red] [Right:Red] Hair Growth on Extremity: [Left:No] [Right:No] Temperature of Extremity: [Left:Warm] [Right:Warm] Capillary Refill: [Left:> 3 seconds] [Right:< 3 seconds] Blood Pressure: Brachial: [Left:140] [Right:146] Dorsalis Pedis: 142 [Left:Dorsalis Pedis: 142] Ankle: Posterior Tibial: 150 [Left:Posterior Tibial: 200 1.03] [Right:1.37] Toe Nail Assessment Left: Right: Thick: Yes Yes Discolored: Yes Yes Deformed: Yes Yes Improper Length and Hygiene: Yes Yes Notes Left 52 to Knee Right 51 to knee Electronic Signature(s) Signed: 11/11/2017 4:15:20 PM By: Alejandro Mulling Signed: 11/11/2017 4:24:03 PM By: Elliot Gurney [name], RN, CWS, [name], [name] Boardley, Chicken ([id number]) Entered By: Elliot Gurney, [name], RN, CWS, Kim on 11/11/2017 08:59:58 [name] ([id number]) -------------------------------------------------------------------------------- Multi Wound Chart Details Patient Name: [name] C. Date of Service: 11/11/2017 8:00 AM Medical Record Number: [id number] Patient Account Number: [id number] Date of Birth/Sex: 1929-04-09 (84 y.o. Male) Treating RN: Huel Coventry Primary Care Davina Howlett: Guerry Bruin Other Clinician: Referring Amalie Koran: Referral, Self Treating Latresha Yahr/Extender: Maxwell Caul Weeks in Treatment: 0 Vital Signs Height(in): 68 Pulse(bpm): 73 Weight(lbs): 150 Blood Pressure(mmHg): 141/85 Body Mass  Index(BMI): 23 Temperature(F): 98.0 Respiratory Rate 16 (breaths/min): Photos: [5:No Photos] [6:No Photos] [7:No Photos] Wound Location: [5:Left Malleolus - Medial] [6:Right Lower Leg - Medial] [7:Right Lower Leg - Anterior] Wounding Event: [5:Blister] [6:Blister] [7:Gradually Appeared] Primary Etiology: [5:Venous Leg Ulcer] [6:Venous Leg Ulcer] [7:Venous Leg Ulcer] Comorbid History: [5:Cataracts, Arrhythmia, Deep Vein Thrombosis, Osteoarthritis] [6:Cataracts, Arrhythmia, Deep Vein Thrombosis, Osteoarthritis] [7:Cataracts, Arrhythmia, Deep Vein Thrombosis, Osteoarthritis] Date Acquired: [5:10/28/2017] [6:10/28/2017] [7:10/28/2017] Weeks of Treatment: [5:0] [6:0] [7:0] Wound Status: [5:Open] [6:Open] [7:Open] Measurements L x W x D [5:0.5x0.6x0.1] [6:1.5x0.5x0.1] [7:0.5x0.5x0.1] (cm) Area (cm) : [5:0.236] [6:0.589] [7:0.196] Volume (cm) : [5:0.024] [6:0.059] [7:0.02] % Reduction in Area: [5:0.00%] [6:N/A] [7:N/A] % Reduction in Volume: [5:0.00%] [6:N/A] [7:N/A] Classification: [5:Full Thickness Without Exposed Support Structures] [6:Full Thickness Without Exposed Support Structures] [7:Full Thickness Without Exposed Support Structures] Exudate Amount: [5:Large] [6:Large] [7:Large] Exudate Type: [5:Serous] [6:Serous] [7:Serous] Exudate Color: [5:amber] [6:amber] [7:amber] Wound Margin: [5:Flat and Intact] [6:Flat and Intact] [7:Flat and Intact] Granulation Amount: [5:Medium (34-66%)] [6:None Present (0%)] [7:Large (67-100%)] Granulation Quality: [5:Red] [6:N/A] [7:Pink] Necrotic Amount: [5:Medium (34-66%)] [6:Large (67-100%)] [7:None Present (0%)] Exposed Structures: [5:Fascia: No Fat Layer (Subcutaneous Tissue) Exposed: No Tendon: No Muscle: No Joint: No Bone: No] [6:Fascia: No Fat Layer (Subcutaneous Tissue) Exposed: No Tendon: No Muscle: No Joint: No Bone: No] [7:Fascia: No Fat Layer (Subcutaneous Tissue) Exposed:  No Tendon: No Muscle: No Joint: No Bone: No] Epithelialization:  [5:None] [6:None] [7:None] Periwound Skin Texture: [5:No Abnormalities Noted] [6:No Abnormalities Noted] [7:No Abnormalities Noted] Periwound Skin Moisture: [5:No Abnormalities Noted] [  6:No Abnormalities Noted] [7:No Abnormalities Noted] Periwound Skin Color: [5:Erythema: Yes] [6:Erythema: Yes] [7:No Abnormalities Noted] Erythema Location: [5:Circumferential] [6:Circumferential] [7:N/A] Temperature: No Abnormality No Abnormality No Abnormality Tenderness on Palpation: Yes Yes Yes Wound Preparation: Ulcer Cleansing: Ulcer Cleansing: Ulcer Cleansing: Rinsed/Irrigated with Saline Rinsed/Irrigated with Saline Rinsed/Irrigated with Saline Topical Anesthetic Applied: Topical Anesthetic Applied: Topical Anesthetic Applied: None None None Procedures Performed: Compression Therapy Compression Therapy Compression Therapy Wound Number: 8 N/A N/A Photos: No Photos N/A N/A Wound Location: Right Lower Leg - Lateral N/A N/A Wounding Event: Blister N/A N/A Primary Etiology: Venous Leg Ulcer N/A N/A Comorbid History: Cataracts, Arrhythmia, Deep N/A N/A Vein Thrombosis, Osteoarthritis Date Acquired: 10/28/2017 N/A N/A Weeks of Treatment: 0 N/A N/A Wound Status: Open N/A N/A Measurements L x W x D 0.3x0.3x0.1 N/A N/A (cm) Area (cm) : 0.071 N/A N/A Volume (cm) : 0.007 N/A N/A % Reduction in Area: N/A N/A N/A % Reduction in Volume: N/A N/A N/A Classification: Full Thickness Without N/A N/A Exposed Support Structures Exudate Amount: Large N/A N/A Exudate Type: Serous N/A N/A Exudate Color: amber N/A N/A Wound Margin: Flat and Intact N/A N/A Granulation Amount: None Present (0%) N/A N/A Granulation Quality: N/A N/A N/A Necrotic Amount: Large (67-100%) N/A N/A Exposed Structures: Fascia: No N/A N/A Fat Layer (Subcutaneous Tissue) Exposed: No Tendon: No Muscle: No Joint: No Bone: No Epithelialization: None N/A N/A Periwound Skin Texture: No Abnormalities Noted N/A N/A Periwound Skin  Moisture: No Abnormalities Noted N/A N/A Periwound Skin Color: No Abnormalities Noted N/A N/A Erythema Location: N/A N/A N/A Temperature: No Abnormality N/A N/A Tenderness on Palpation: Yes N/A N/A Wound Preparation: Ulcer Cleansing: N/A N/A Rinsed/Irrigated with Saline Topical Anesthetic Applied: None Procedures Performed: Compression Therapy N/A N/A Treatment Notes Labrador, Josey C. ([id number]) Wound #5 (Left, Medial Malleolus) 1. Cleansed with: Clean wound with Normal Saline 4. Dressing Applied: Other dressing (specify in notes) 5. Secondary Dressing Applied ABD Pad 7. Secured with 3 Layer Compression System - Bilateral Notes silvercel, nystatin to non wound area Wound #6 (Right, Medial Lower Leg) 1. Cleansed with: Clean wound with Normal Saline 4. Dressing Applied: Other dressing (specify in notes) 5. Secondary Dressing Applied ABD Pad 7. Secured with 3 Layer Compression System - Bilateral Notes silvercel, nystatin to non wound area Wound #7 (Right, Anterior Lower Leg) 1. Cleansed with: Clean wound with Normal Saline 4. Dressing Applied: Other dressing (specify in notes) 5. Secondary Dressing Applied ABD Pad 7. Secured with 3 Layer Compression System - Bilateral Notes silvercel, nystatin to non wound area Wound #8 (Right, Lateral Lower Leg) 1. Cleansed with: Clean wound with Normal Saline 4. Dressing Applied: Other dressing (specify in notes) 5. Secondary Dressing Applied ABD Pad 7. Secured with 3 Layer Compression System - Bilateral Notes silvercel, nystatin to non wound area Electronic Signature(s) AVIGDOR, DOLLAR ([id number]) Signed: 11/11/2017 5:10:26 PM By: Baltazar Najjar MD Entered By: Baltazar Najjar on 11/11/2017 09:39:51 [name] ([id number]) -------------------------------------------------------------------------------- Multi-Disciplinary Care Plan Details Patient Name: [name] C. Date of Service: 11/11/2017 8:00  AM Medical Record Number: [id number] Patient Account Number: [id number] Date of Birth/Sex: 01/02/1929 (84 y.o. Male) Treating RN: Huel Coventry Primary Care Quanesha Klimaszewski: Guerry Bruin Other Clinician: Referring Jamille Yoshino: Referral, Self Treating Salvator Seppala/Extender: Altamese [place] in Treatment: 0 Active Inactive ` Abuse / Safety / Falls / Self Care Management Nursing Diagnoses: Impaired physical mobility Potential for falls Goals: Patient will not develop complications from immobility Date Initiated: 11/11/2017 Target Resolution Date: 12/09/2017 Goal Status: Active Patient will remain  injury free related to falls Date Initiated: 11/11/2017 Target Resolution Date: 12/09/2017 Goal Status: Active Interventions: Assess fall risk on admission and as needed Notes: ` Nutrition Nursing Diagnoses: Potential for alteratiion in Nutrition/Potential for imbalanced nutrition Goals: Patient/caregiver agrees to and verbalizes understanding of need to use nutritional supplements and/or vitamins as prescribed Date Initiated: 11/11/2017 Target Resolution Date: 12/10/2017 Goal Status: Active Interventions: Assess patient nutrition upon admission and as needed per policy Notes: ` Orientation to the Wound Care Program Nursing Diagnoses: Knowledge deficit related to the wound healing center program Goals: Patient/caregiver will verbalize understanding of the Wound Healing Center [address] [place] ([id number]) Date Initiated: 11/11/2017 Target Resolution Date: 12/10/2017 Goal Status: Active Interventions: Provide education on orientation to the wound center Notes: ` Venous Leg Ulcer Nursing Diagnoses: Actual venous Insuffiency (use after diagnosis is confirmed) Goals: Non-invasive venous studies are completed as ordered Date Initiated: 11/11/2017 Target Resolution Date: 12/10/2017 Goal Status: Active Interventions: Assess peripheral edema status every visit. Treatment  Activities: Non-invasive vascular studies : 11/11/2017 Notes: ` Wound/Skin Impairment Nursing Diagnoses: Impaired tissue integrity Goals: Ulcer/skin breakdown will have a volume reduction of 80% by week 12 Date Initiated: 11/11/2017 Target Resolution Date: 12/10/2017 Goal Status: Active Interventions: Assess patient/caregiver ability to obtain necessary supplies Provide education on ulcer and skin care Treatment Activities: Topical wound management initiated : 11/11/2017 Notes: Electronic Signature(s) Signed: 11/11/2017 4:24:03 PM By: Elliot Gurney, [name], RN, CWS, [name], [name] Entered By: Elliot Gurney, [name], RN, CWS, Kim on 11/11/2017 08:51:01 Dorian, [name] ([id number]) -------------------------------------------------------------------------------- Non-Wound Condition Assessment Details Patient Name: [name] C. Date of Service: 11/11/2017 8:00 AM Medical Record Number: [id number] Patient Account Number: [id number] Date of Birth/Sex: 1929/07/14 (83 y.o. Male) Treating RN: Curtis Sites Primary Care Liston Thum: Guerry Bruin Other Clinician: Referring Ilyas Lipsitz: Referral, Self Treating Parvin Stetzer/Extender: Maxwell Caul Weeks in Treatment: 0 Non-Wound Condition: Condition: Rash / Dermatitis Location: Foot Side: Right patient has a circular raised area on his dorsal right foot. Dr Leanord Hawking aware and ordered nystatin cream Notes: in office and lotrimin cream twice daily at home. Photos Periwound Skin Texture Texture Color No Abnormalities Noted: No No Abnormalities Noted: No Moisture No Abnormalities Noted: No Electronic Signature(s) Signed: 11/11/2017 4:38:42 PM By: Curtis Sites Entered By: Curtis Sites on 11/11/2017 10:13:46 [name] ([id number]) -------------------------------------------------------------------------------- Pain Assessment Details Patient Name: [name] C. Date of Service: 11/11/2017 8:00 AM Medical Record Number: [id number] Patient Account  Number: [id number] Date of Birth/Sex: May 07, 1929 (84 y.o. Male) Treating RN: Phillis Haggis Primary Care Zaneta Lightcap: Guerry Bruin Other Clinician: Referring Kalayla Shadden: Referral, Self Treating Grettell Ransdell/Extender: Maxwell Caul Weeks in Treatment: 0 Active Problems Location of Pain Severity and Description of Pain Patient Has Paino No Site Locations Pain Management and Medication Current Pain Management: Electronic Signature(s) Signed: 11/11/2017 4:15:20 PM By: Alejandro Mulling Entered By: Alejandro Mulling on 11/11/2017 08:11:17 [name] ([id number]) -------------------------------------------------------------------------------- Patient/Caregiver Education Details Patient Name: [name]. Date of Service: 11/11/2017 8:00 AM Medical Record Number: [id number] Patient Account Number: [id number] Date of Birth/Gender: Sep 30, 1928 (84 y.o. Male) Treating RN: Curtis Sites Primary Care Physician: Guerry Bruin Other Clinician: Referring Physician: Referral, Self Treating Physician/Extender: Altamese [place] in Treatment: 0 Education Assessment Education Provided To: Patient and Caregiver Education Topics Provided Venous: Handouts: Other: leg elevation Methods: Explain/Verbal Responses: State content correctly Electronic Signature(s) Signed: 11/11/2017 4:38:42 PM By: Curtis Sites Entered By: Curtis Sites on 11/11/2017 09:33:00 [name] ([id number]) -------------------------------------------------------------------------------- Wound Assessment Details Patient Name: [name] C. Date of Service: 11/11/2017 8:00  AM Medical Record Number: [id number] Patient Account Number: [id number] Date of Birth/Sex: 1929/04/26 (84 y.o. Male) Treating RN: Phillis Haggis Primary Care Brooks Stotz: Guerry Bruin Other Clinician: Referring Callaghan Laverdure: Referral, Self Treating Jakolby Sedivy/Extender: Maxwell Caul Weeks in Treatment: 0 Wound Status Wound  Number: 5 Primary Venous Leg Ulcer Etiology: Wound Location: Left Malleolus - Medial Wound Status: Open Wounding Event: Blister Comorbid Cataracts, Arrhythmia, Deep Vein Date Acquired: 10/28/2017 History: Thrombosis, Osteoarthritis Weeks Of Treatment: 0 Clustered Wound: No Photos Photo Uploaded By: Elliot Gurney, [name], RN, CWS, Kim on 11/11/2017 16:50:51 Wound Measurements Length: (cm) 0.5 Width: (cm) 0.6 Depth: (cm) 0.1 Area: (cm) 0.236 Volume: (cm) 0.024 % Reduction in Area: 0% % Reduction in Volume: 0% Epithelialization: None Tunneling: No Undermining: No Wound Description Full Thickness Without Exposed Support Foul Od Classification: Structures Slough/ Wound Margin: Flat and Intact Exudate Large Amount: Exudate Type: Serous Exudate Color: amber or After Cleansing: No Fibrino Yes Wound Bed Granulation Amount: Medium (34-66%) Exposed Structure Granulation Quality: Red Fascia Exposed: No Necrotic Amount: Medium (34-66%) Fat Layer (Subcutaneous Tissue) Exposed: No Necrotic Quality: Adherent Slough Tendon Exposed: No Muscle Exposed: No Joint Exposed: No Bone Exposed: No Marcil, Abdifatah C. ([id number]) Periwound Skin Texture Texture Color No Abnormalities Noted: No No Abnormalities Noted: No Erythema: Yes Moisture Erythema Location: Circumferential No Abnormalities Noted: No Temperature / Pain Temperature: No Abnormality Tenderness on Palpation: Yes Wound Preparation Ulcer Cleansing: Rinsed/Irrigated with Saline Topical Anesthetic Applied: None Treatment Notes Wound #5 (Left, Medial Malleolus) 1. Cleansed with: Clean wound with Normal Saline 4. Dressing Applied: Other dressing (specify in notes) 5. Secondary Dressing Applied ABD Pad 7. Secured with 3 Layer Compression System - Bilateral Notes silvercel, nystatin to non wound area Electronic Signature(s) Signed: 11/11/2017 4:15:20 PM By: Alejandro Mulling Entered By: Alejandro Mulling on 11/11/2017  08:34:15 [name] ([id number]) -------------------------------------------------------------------------------- Wound Assessment Details Patient Name: [name] C. Date of Service: 11/11/2017 8:00 AM Medical Record Number: [id number] Patient Account Number: [id number] Date of Birth/Sex: 22-May-1929 (84 y.o. Male) Treating RN: Phillis Haggis Primary Care Mkayla Steele: Guerry Bruin Other Clinician: Referring Chrisa Hassan: Referral, Self Treating Makalya Nave/Extender: Maxwell Caul Weeks in Treatment: 0 Wound Status Wound Number: 6 Primary Venous Leg Ulcer Etiology: Wound Location: Right Lower Leg - Medial Wound Status: Open Wounding Event: Blister Comorbid Cataracts, Arrhythmia, Deep Vein Date Acquired: 10/28/2017 History: Thrombosis, Osteoarthritis Weeks Of Treatment: 0 Clustered Wound: No Photos Photo Uploaded By: Elliot Gurney, [name], RN, CWS, Kim on 11/11/2017 16:50:52 Wound Measurements Length: (cm) 1.5 Width: (cm) 0.5 Depth: (cm) 0.1 Area: (cm) 0.589 Volume: (cm) 0.059 % Reduction in Area: % Reduction in Volume: Epithelialization: None Tunneling: No Undermining: No Wound Description Full Thickness Without Exposed Support Foul Od Classification: Structures Slough/ Wound Margin: Flat and Intact Exudate Large Amount: Exudate Type: Serous Exudate Color: amber or After Cleansing: No Fibrino No Wound Bed Granulation Amount: None Present (0%) Exposed Structure Necrotic Amount: Large (67-100%) Fascia Exposed: No Necrotic Quality: Adherent Slough Fat Layer (Subcutaneous Tissue) Exposed: No Tendon Exposed: No Muscle Exposed: No Joint Exposed: No Bone Exposed: No Paxson, Shone C. ([id number]) Periwound Skin Texture Texture Color No Abnormalities Noted: No No Abnormalities Noted: No Erythema: Yes Moisture Erythema Location: Circumferential No Abnormalities Noted: No Temperature / Pain Temperature: No Abnormality Tenderness on Palpation: Yes Wound  Preparation Ulcer Cleansing: Rinsed/Irrigated with Saline Topical Anesthetic Applied: None Treatment Notes Wound #6 (Right, Medial Lower Leg) 1. Cleansed with: Clean wound with Normal Saline 4. Dressing Applied: Other dressing (specify in notes) 5. Secondary Dressing Applied ABD Pad 7.  Secured with 3 Layer Compression System - Bilateral Notes silvercel, nystatin to non wound area Electronic Signature(s) Signed: 11/11/2017 4:15:20 PM By: Alejandro MullingPinkerton, Debra Entered By: Alejandro MullingPinkerton, Debra on 11/11/2017 08:37:31 Mcguire, [name] ([id number]) -------------------------------------------------------------------------------- Wound Assessment Details Patient Name: Donald MuscaWILLIFORD, Laster C. Date of Service: 11/11/2017 8:00 AM Medical Record Number: [id number] Patient Account Number: [id number] Date of Birth/Sex: 05/02/1929 [age] y.o. Male) Treating RN: Phillis HaggisPinkerton, Debi Primary Care Destry Bezdek: Guerry BruinISOVEC, RICHARD Other Clinician: Referring Natassja Ollis: Referral, Self Treating Nikkolas Coomes/Extender: Maxwell CaulOBSON, MICHAEL G Weeks in Treatment: 0 Wound Status Wound Number: 7 Primary Venous Leg Ulcer Etiology: Wound Location: Right Lower Leg - Anterior Wound Status: Open Wounding Event: Gradually Appeared Comorbid Cataracts, Arrhythmia, Deep Vein Date Acquired: 10/28/2017 History: Thrombosis, Osteoarthritis Weeks Of Treatment: 0 Clustered Wound: No Photos Photo Uploaded By: Elliot GurneyWoody, [name], RN, CWS, Kim on 11/11/2017 16:51:23 Wound Measurements Length: (cm) 0.5 Width: (cm) 0.5 Depth: (cm) 0.1 Area: (cm) 0.196 Volume: (cm) 0.02 % Reduction in Area: % Reduction in Volume: Epithelialization: None Tunneling: No Undermining: No Wound Description Full Thickness Without Exposed Support Foul Od Classification: Structures Slough/ Wound Margin: Flat and Intact Exudate Large Amount: Exudate Type: Serous Exudate Color: amber or After Cleansing: No Fibrino No Wound Bed Granulation Amount: Large (67-100%) Exposed  Structure Granulation Quality: Pink Fascia Exposed: No Necrotic Amount: None Present (0%) Fat Layer (Subcutaneous Tissue) Exposed: No Tendon Exposed: No Muscle Exposed: No Joint Exposed: No Bone Exposed: No Hovland, Kendric C. ([id number]) Periwound Skin Texture Texture Color No Abnormalities Noted: No No Abnormalities Noted: No Moisture Temperature / Pain No Abnormalities Noted: No Temperature: No Abnormality Tenderness on Palpation: Yes Wound Preparation Ulcer Cleansing: Rinsed/Irrigated with Saline Topical Anesthetic Applied: None Treatment Notes Wound #7 (Right, Anterior Lower Leg) 1. Cleansed with: Clean wound with Normal Saline 4. Dressing Applied: Other dressing (specify in notes) 5. Secondary Dressing Applied ABD Pad 7. Secured with 3 Layer Compression System - Bilateral Notes silvercel, nystatin to non wound area Electronic Signature(s) Signed: 11/11/2017 4:15:20 PM By: Alejandro MullingPinkerton, Debra Entered By: Alejandro MullingPinkerton, Debra on 11/11/2017 08:39:03 Donald MouseWILLIFORD, Shrihaan C. ([id number]) -------------------------------------------------------------------------------- Wound Assessment Details Patient Name: Donald MuscaWILLIFORD, Alijah C. Date of Service: 11/11/2017 8:00 AM Medical Record Number: [id number] Patient Account Number: [id number] Date of Birth/Sex: 10/16/1928 [age] y.o. Male) Treating RN: Phillis HaggisPinkerton, Debi Primary Care Zakhia Seres: Guerry BruinISOVEC, RICHARD Other Clinician: Referring Adriane Gabbert: Referral, Self Treating Kailin Leu/Extender: Maxwell CaulOBSON, MICHAEL G Weeks in Treatment: 0 Wound Status Wound Number: 8 Primary Venous Leg Ulcer Etiology: Wound Location: Right Lower Leg - Lateral Wound Status: Open Wounding Event: Blister Comorbid Cataracts, Arrhythmia, Deep Vein Date Acquired: 10/28/2017 History: Thrombosis, Osteoarthritis Weeks Of Treatment: 0 Clustered Wound: No Photos Photo Uploaded By: Elliot GurneyWoody, [name], RN, CWS, Kim on 11/11/2017 16:51:23 Wound Measurements Length: (cm) 0.3 Width: (cm)  0.3 Depth: (cm) 0.1 Area: (cm) 0.071 Volume: (cm) 0.007 % Reduction in Area: % Reduction in Volume: Epithelialization: None Tunneling: No Undermining: No Wound Description Full Thickness Without Exposed Support Foul Od Classification: Structures Slough/ Wound Margin: Flat and Intact Exudate Large Amount: Exudate Type: Serous Exudate Color: amber or After Cleansing: No Fibrino Yes Wound Bed Granulation Amount: None Present (0%) Exposed Structure Necrotic Amount: Large (67-100%) Fascia Exposed: No Necrotic Quality: Adherent Slough Fat Layer (Subcutaneous Tissue) Exposed: No Tendon Exposed: No Muscle Exposed: No Joint Exposed: No Bone Exposed: No Coudriet, Butler C. ([id number]) Periwound Skin Texture Texture Color No Abnormalities Noted: No No Abnormalities Noted: No Moisture Temperature / Pain No Abnormalities Noted: No Temperature: No Abnormality Tenderness on Palpation: Yes Wound Preparation Ulcer Cleansing: Rinsed/Irrigated with  Saline Topical Anesthetic Applied: None Treatment Notes Wound #8 (Right, Lateral Lower Leg) 1. Cleansed with: Clean wound with Normal Saline 4. Dressing Applied: Other dressing (specify in notes) 5. Secondary Dressing Applied ABD Pad 7. Secured with 3 Layer Compression System - Bilateral Notes silvercel, nystatin to non wound area Electronic Signature(s) Signed: 11/11/2017 4:15:20 PM By: Alejandro Mulling Entered By: Alejandro Mulling on 11/11/2017 08:40:31 Lacount, [name] ([id number]) -------------------------------------------------------------------------------- Vitals Details Patient Name: [name] C. Date of Service: 11/11/2017 8:00 AM Medical Record Number: [id number] Patient Account Number: [id number] Date of Birth/Sex: 1928/09/26 (84 y.o. Male) Treating RN: Phillis Haggis Primary Care Elisia Stepp: Guerry Bruin Other Clinician: Referring Ashton Sabine: Referral, Self Treating Chayton Murata/Extender: Maxwell Caul Weeks in Treatment: 0 Vital Signs Time Taken: 08:11 Temperature (F): 98.0 Height (in): 68 Pulse (bpm): 73 Source: Stated Respiratory Rate (breaths/min): 16 Weight (lbs): 150 Blood Pressure (mmHg): 141/85 Source: Stated Reference Range: 80 - 120 mg / dl Body Mass Index (BMI): 22.8 Electronic Signature(s) Signed: 11/11/2017 4:15:20 PM By: Alejandro Mulling Entered By: Alejandro Mulling on 11/11/2017 08:13:26

## 2017-11-16 DIAGNOSIS — I10 Essential (primary) hypertension: Secondary | ICD-10-CM | POA: Diagnosis not present

## 2017-11-16 DIAGNOSIS — I87333 Chronic venous hypertension (idiopathic) with ulcer and inflammation of bilateral lower extremity: Secondary | ICD-10-CM | POA: Diagnosis not present

## 2017-11-16 DIAGNOSIS — I4891 Unspecified atrial fibrillation: Secondary | ICD-10-CM | POA: Diagnosis not present

## 2017-11-16 DIAGNOSIS — L97211 Non-pressure chronic ulcer of right calf limited to breakdown of skin: Secondary | ICD-10-CM | POA: Diagnosis not present

## 2017-11-16 DIAGNOSIS — L97321 Non-pressure chronic ulcer of left ankle limited to breakdown of skin: Secondary | ICD-10-CM | POA: Diagnosis not present

## 2017-11-16 DIAGNOSIS — L97221 Non-pressure chronic ulcer of left calf limited to breakdown of skin: Secondary | ICD-10-CM | POA: Diagnosis not present

## 2017-11-17 DIAGNOSIS — I482 Chronic atrial fibrillation: Secondary | ICD-10-CM | POA: Diagnosis not present

## 2017-11-17 DIAGNOSIS — I8290 Acute embolism and thrombosis of unspecified vein: Secondary | ICD-10-CM | POA: Diagnosis not present

## 2017-11-17 DIAGNOSIS — Z7901 Long term (current) use of anticoagulants: Secondary | ICD-10-CM | POA: Diagnosis not present

## 2017-11-18 ENCOUNTER — Telehealth: Payer: Self-pay

## 2017-11-18 ENCOUNTER — Encounter: Payer: Medicare Other | Admitting: Internal Medicine

## 2017-11-18 DIAGNOSIS — S81801A Unspecified open wound, right lower leg, initial encounter: Secondary | ICD-10-CM | POA: Diagnosis not present

## 2017-11-18 DIAGNOSIS — I4891 Unspecified atrial fibrillation: Secondary | ICD-10-CM | POA: Diagnosis not present

## 2017-11-18 DIAGNOSIS — S81802A Unspecified open wound, left lower leg, initial encounter: Secondary | ICD-10-CM | POA: Diagnosis not present

## 2017-11-18 DIAGNOSIS — L03116 Cellulitis of left lower limb: Secondary | ICD-10-CM | POA: Diagnosis not present

## 2017-11-18 DIAGNOSIS — I872 Venous insufficiency (chronic) (peripheral): Secondary | ICD-10-CM | POA: Diagnosis not present

## 2017-11-18 DIAGNOSIS — I499 Cardiac arrhythmia, unspecified: Secondary | ICD-10-CM | POA: Diagnosis not present

## 2017-11-18 DIAGNOSIS — L97219 Non-pressure chronic ulcer of right calf with unspecified severity: Secondary | ICD-10-CM | POA: Diagnosis not present

## 2017-11-18 DIAGNOSIS — L97322 Non-pressure chronic ulcer of left ankle with fat layer exposed: Secondary | ICD-10-CM | POA: Diagnosis not present

## 2017-11-18 DIAGNOSIS — L97211 Non-pressure chronic ulcer of right calf limited to breakdown of skin: Secondary | ICD-10-CM | POA: Diagnosis not present

## 2017-11-18 DIAGNOSIS — Z86718 Personal history of other venous thrombosis and embolism: Secondary | ICD-10-CM | POA: Diagnosis not present

## 2017-11-18 DIAGNOSIS — M1990 Unspecified osteoarthritis, unspecified site: Secondary | ICD-10-CM | POA: Diagnosis not present

## 2017-11-18 DIAGNOSIS — I87332 Chronic venous hypertension (idiopathic) with ulcer and inflammation of left lower extremity: Secondary | ICD-10-CM | POA: Diagnosis not present

## 2017-11-18 DIAGNOSIS — I890 Lymphedema, not elsewhere classified: Secondary | ICD-10-CM | POA: Diagnosis not present

## 2017-11-18 NOTE — Telephone Encounter (Signed)
Spoke with scheduler and apt scheduled for pt to see Dr. Johney FrameAllred on Thursday March 28 at 3:15pm, left voicemail ( ok per DPR) on home machine with apt date and time and number to call back it needed to reschedule.

## 2017-11-18 NOTE — Telephone Encounter (Signed)
Pts wife called and stated that pt has not had his pacemaker checked in a while, informed her that she could send a transmission today and I will put it on the schedule for it to be processed she also stated that pt has not seen Dr. Johney FrameAllred in a while. ( pt last seen reene ursuy in 2017 and cancelled 2018 apt with JA.). Informed her that I would talk with our scheduler to get him an apt. Pts wife stated that pt could only come on Tues. And Thurs afternoons. D/t pt being seen at the wound care center and she works in the morning.

## 2017-11-19 ENCOUNTER — Telehealth: Payer: Self-pay | Admitting: Internal Medicine

## 2017-11-19 ENCOUNTER — Encounter: Payer: Medicare Other | Admitting: *Deleted

## 2017-11-19 ENCOUNTER — Telehealth: Payer: Self-pay | Admitting: Cardiology

## 2017-11-19 NOTE — Telephone Encounter (Signed)
Spoke with pt and reminded pt of remote transmission that is due today. Pt verbalized understanding.   

## 2017-11-19 NOTE — Telephone Encounter (Signed)
Spoke with patients wife and explained that we had not yet received patients transmission. I confirmed that the home monitor was set up appropriately. I offered to walk them through sending a transmission. They declined and will attempt to send again. Patient and wife will call if they have additional issues.

## 2017-11-19 NOTE — Telephone Encounter (Signed)
New message ° ° ° °1. Has your device fired? no ° °2. Is you device beeping? no ° °3. Are you experiencing draining or swelling at device site? no ° °4. Are you calling to see if we received your device transmission? yes ° °5. Have you passed out? no ° ° ° °Please route to Device Clinic Pool °

## 2017-11-20 ENCOUNTER — Encounter: Payer: Self-pay | Admitting: Cardiology

## 2017-11-20 DIAGNOSIS — I4891 Unspecified atrial fibrillation: Secondary | ICD-10-CM | POA: Diagnosis not present

## 2017-11-20 DIAGNOSIS — L97221 Non-pressure chronic ulcer of left calf limited to breakdown of skin: Secondary | ICD-10-CM | POA: Diagnosis not present

## 2017-11-20 DIAGNOSIS — L97211 Non-pressure chronic ulcer of right calf limited to breakdown of skin: Secondary | ICD-10-CM | POA: Diagnosis not present

## 2017-11-20 DIAGNOSIS — I87333 Chronic venous hypertension (idiopathic) with ulcer and inflammation of bilateral lower extremity: Secondary | ICD-10-CM | POA: Diagnosis not present

## 2017-11-20 DIAGNOSIS — L97321 Non-pressure chronic ulcer of left ankle limited to breakdown of skin: Secondary | ICD-10-CM | POA: Diagnosis not present

## 2017-11-20 DIAGNOSIS — I10 Essential (primary) hypertension: Secondary | ICD-10-CM | POA: Diagnosis not present

## 2017-11-20 NOTE — Progress Notes (Signed)
[name], Esvin C. ([id number]) Visit Report for 11/18/2017 Arrival Information Details Patient Name: [name], [name] Date of Service: 11/18/2017 2:45 PM Medical Record Number: [id number] Patient Account Number: [id number] Date of Birth/Sex: 12/01/1928 [age] y.o. Male) Treating RN: Phillis HaggisPinkerton, Debi Primary Care Treyvonne Tata: Guerry BruinISOVEC, RICHARD Other Clinician: Referring Babatunde Seago: Guerry BruinISOVEC, RICHARD Treating Patrese Neal/Extender: Altamese CarolinaOBSON, MICHAEL G Weeks in Treatment: 1 Visit Information History Since Last Visit All ordered tests and consults were completed: No Patient Arrived: Wheel Chair Added or deleted any medications: No Arrival Time: 15:15 Any new allergies or adverse reactions: No Accompanied By: wife Had a fall or experienced change in No Transfer Assistance: EasyPivot Patient activities of daily living that may affect Lift risk of falls: Patient Identification Verified: Yes Signs or symptoms of abuse/neglect since last visito No Secondary Verification Process Yes Hospitalized since last visit: No Completed: Has Dressing in Place as Prescribed: Yes Patient Requires Transmission-Based No Precautions: Has Compression in Place as Prescribed: Yes Patient Has Alerts: Yes Pain Present Now: No Patient Alerts: Patient on Blood Thinner Warfarin Pacemaker Electronic Signature(s) Signed: 11/18/2017 4:40:08 PM By: Alejandro MullingPinkerton, Debra Entered By: Alejandro MullingPinkerton, Debra on 11/18/2017 15:17:24 [name], [name] ([id number]) -------------------------------------------------------------------------------- Compression Therapy Details Patient Name: [name], [name] Date of Service: 11/18/2017 2:45 PM Medical Record Number: [id number] Patient Account Number: [id number] Date of Birth/Sex: 10/26/1928 [age] y.o. Male) Treating RN: Huel CoventryWoody, Kim Primary Care Berneice Zettlemoyer: Guerry BruinISOVEC, RICHARD Other Clinician: Referring Mellanie Bejarano: Guerry BruinISOVEC, RICHARD Treating Precilla Purnell/Extender: Maxwell CaulOBSON, MICHAEL G Weeks in Treatment:  1 Compression Therapy Performed for Wound Assessment: Wound #5 Left,Medial Malleolus Performed By: Clinician Huel CoventryWoody, Kim, RN Compression Type: Three Layer Pre Treatment ABI: 1.3 Post Procedure Diagnosis Same as Pre-procedure Electronic Signature(s) Signed: 11/18/2017 5:38:59 PM By: Elliot GurneyWoody, [name], RN, CWS, [name], [name] Entered By: Elliot GurneyWoody, [name], RN, CWS, Kim on 11/18/2017 15:50:24 [name], [name] ([id number]) -------------------------------------------------------------------------------- Compression Therapy Details Patient Name: [name], [name] Date of Service: 11/18/2017 2:45 PM Medical Record Number: [id number] Patient Account Number: [id number] Date of Birth/Sex: 01/13/1929 [age] y.o. Male) Treating RN: Huel CoventryWoody, Kim Primary Care Sherran Margolis: Guerry BruinISOVEC, RICHARD Other Clinician: Referring Annasophia Crocker: Guerry BruinISOVEC, RICHARD Treating Linette Gunderson/Extender: Maxwell CaulOBSON, MICHAEL G Weeks in Treatment: 1 Compression Therapy Performed for Wound Assessment: Wound #6 Right,Medial Lower Leg Performed By: Clinician Huel CoventryWoody, Kim, RN Compression Type: Three Layer Pre Treatment ABI: 1.3 Post Procedure Diagnosis Same as Pre-procedure Electronic Signature(s) Signed: 11/18/2017 5:38:59 PM By: Elliot GurneyWoody, [name], RN, CWS, [name], [name] Entered By: Elliot GurneyWoody, [name], RN, CWS, Kim on 11/18/2017 15:50:24 [name], Kaedan C. ([id number]) -------------------------------------------------------------------------------- Encounter Discharge Information Details Patient Name: [name], [name] Date of Service: 11/18/2017 2:45 PM Medical Record Number: [id number] Patient Account Number: [id number] Date of Birth/Sex: 07/13/1929 [age] y.o. Male) Treating RN: Huel CoventryWoody, Kim Primary Care Janisha Bueso: Guerry BruinISOVEC, RICHARD Other Clinician: Referring Joline Encalada: Guerry BruinISOVEC, RICHARD Treating Latish Toutant/Extender: Altamese CarolinaOBSON, MICHAEL G Weeks in Treatment: 1 Encounter Discharge Information Items Discharge Pain Level: 0 Discharge Condition: Stable Ambulatory Status:  Wheelchair Discharge Destination: Home Transportation: Private Auto Accompanied By: spouse Schedule Follow-up Appointment: Yes Medication Reconciliation completed and No provided to Patient/Care Cason Luffman: Provided on Clinical Summary of Care: 11/18/2017 Form Type Recipient Paper Patient RW Electronic Signature(s) Signed: 11/18/2017 5:19:26 PM By: Curtis Sitesorthy, Joanna Entered By: Curtis Sitesorthy, Joanna on 11/18/2017 17:11:25 [name], Diandre C. ([id number]) -------------------------------------------------------------------------------- Lower Extremity Assessment Details Patient Name: [name], [name] Date of Service: 11/18/2017 2:45 PM Medical Record Number: [id number] Patient Account Number: [id number] Date of Birth/Sex: 02/05/1929 [age] y.o. Male) Treating RN: Phillis HaggisPinkerton, Debi Primary Care Teighlor Korson: Guerry BruinISOVEC, RICHARD Other Clinician: Referring Tabytha Gradillas: Guerry BruinISOVEC, RICHARD Treating Curry Dulski/Extender: Baltazar NajjarOBSON, MICHAEL  G Weeks in Treatment: 1 Edema Assessment Assessed: [Left: No] [Right: No] [Left: Edema] [Right: :] Calf Left: Right: Point of Measurement: 33 cm From Medial Instep 39 cm 36 cm Ankle Left: Right: Point of Measurement: 12 cm From Medial Instep 22 cm 25 cm Vascular Assessment Pulses: Dorsalis Pedis Palpable: [Left:No] [Right:No] Doppler Audible: [Left:Yes] [Right:Yes] Posterior Tibial Extremity colors, hair growth, and conditions: Extremity Color: [Left:Red] [Right:Red] Temperature of Extremity: [Left:Warm] [Right:Warm] Capillary Refill: [Left:< 3 seconds] [Right:< 3 seconds] Electronic Signature(s) Signed: 11/18/2017 4:40:08 PM By: Alejandro Mulling Entered By: Alejandro Mulling on 11/18/2017 15:37:20 [name], [name] ([id number]) -------------------------------------------------------------------------------- Multi Wound Chart Details Patient Name: [name] C. Date of Service: 11/18/2017 2:45 PM Medical Record Number: [id number] Patient Account Number:  [id number] Date of Birth/Sex: [birth year] (84 y.o. Male) Treating RN: Huel Coventry Primary Care Analiese Krupka: Guerry Bruin Other Clinician: Referring Tian Davison: Guerry Bruin Treating Jamicah Anstead/Extender: Maxwell Caul Weeks in Treatment: 1 Vital Signs Height(in): 68 Pulse(bpm): 102 Weight(lbs): 150 Blood Pressure(mmHg): 103/76 Body Mass Index(BMI): 23 Temperature(F): 98.0 Respiratory Rate 16 (breaths/min): Photos: [5:No Photos] [6:No Photos] [7:No Photos] Wound Location: [5:Left Malleolus - Medial] [6:Right Lower Leg - Medial] [7:Right Lower Leg - Anterior] Wounding Event: [5:Blister] [6:Blister] [7:Gradually Appeared] Primary Etiology: [5:Venous Leg Ulcer] [6:Venous Leg Ulcer] [7:Venous Leg Ulcer] Comorbid History: [5:Cataracts, Arrhythmia, Deep Vein Thrombosis, Osteoarthritis] [6:Cataracts, Arrhythmia, Deep Vein Thrombosis, Osteoarthritis] [7:Cataracts, Arrhythmia, Deep Vein Thrombosis, Osteoarthritis] Date Acquired: [5:10/28/2017] [6:10/28/2017] [7:10/28/2017] Weeks of Treatment: [5:1] [6:1] [7:1] Wound Status: [5:Open] [6:Open] [7:Open] Measurements L x W x D [5:1x0.8x0.1] [6:0.5x0.3x0.1] [7:0.5x0.5x0.1] (cm) Area (cm) : [5:0.628] [6:0.118] [7:0.196] Volume (cm) : [5:0.063] [6:0.012] [7:0.02] % Reduction in Area: [5:-166.10%] [6:80.00%] [7:0.00%] % Reduction in Volume: [5:-162.50%] [6:79.70%] [7:0.00%] Classification: [5:Full Thickness Without Exposed Support Structures] [6:Full Thickness Without Exposed Support Structures] [7:Full Thickness Without Exposed Support Structures] Exudate Amount: [5:Large] [6:Small] [7:Small] Exudate Type: [5:Serous] [6:Serosanguineous] [7:Serosanguineous] Exudate Color: [5:amber] [6:red, brown] [7:red, brown] Wound Margin: [5:Flat and Intact] [6:Flat and Intact] [7:Flat and Intact] Granulation Amount: [5:Small (1-33%)] [6:None Present (0%)] [7:Large (67-100%)] Granulation Quality: [5:Red] [6:N/A] [7:Pink] Necrotic Amount: [5:Large (67-100%)]  [6:Large (67-100%)] [7:None Present (0%)] Exposed Structures: [5:Fascia: No Fat Layer (Subcutaneous Tissue) Exposed: No Tendon: No Muscle: No Joint: No Bone: No] [6:Fascia: No Fat Layer (Subcutaneous Tissue) Exposed: No Tendon: No Muscle: No Joint: No Bone: No] [7:Fascia: No Fat Layer (Subcutaneous Tissue) Exposed:  No Tendon: No Muscle: No Joint: No Bone: No] Epithelialization: [5:None] [6:None] [7:None] Periwound Skin Texture: [5:No Abnormalities Noted] [6:No Abnormalities Noted] [7:No Abnormalities Noted] Periwound Skin Moisture: [5:No Abnormalities Noted] [6:No Abnormalities Noted] [7:No Abnormalities Noted] Periwound Skin Color: [5:Erythema: Yes] [6:Erythema: Yes] [7:No Abnormalities Noted] Erythema Location: [5:Circumferential] [6:Circumferential] [7:N/A] Temperature: No Abnormality No Abnormality No Abnormality Tenderness on Palpation: Yes Yes Yes Wound Preparation: Ulcer Cleansing: Ulcer Cleansing: Ulcer Cleansing: Rinsed/Irrigated with Saline Rinsed/Irrigated with Saline Rinsed/Irrigated with Saline Topical Anesthetic Applied: Topical Anesthetic Applied: Topical Anesthetic Applied: None Other: lidocaine 4% Other: lidocaine 4% Procedures Performed: Compression Therapy Compression Therapy N/A Wound Number: [id number] N/A Photos: No Photos No Photos N/A Wound Location: Right Lower Leg - Lateral Left Lower Leg - Medial N/A Wounding Event: Blister Gradually Appeared N/A Primary Etiology: Venous Leg Ulcer Venous Leg Ulcer N/A Comorbid History: Cataracts, Arrhythmia, Deep Cataracts, Arrhythmia, Deep N/A Vein Thrombosis, Vein Thrombosis, Osteoarthritis Osteoarthritis Date Acquired: 10/28/2017 11/18/2017 N/A Weeks of Treatment: 1 0 N/A Wound Status: Open Open N/A Measurements L x W x D 0.3x0.2x0.1 0.5x1x0.1 N/A (cm) Area (cm) : 0.047 0.393 N/A Volume (cm) : 0.005  0.039 N/A % Reduction in Area: 33.80% N/A N/A % Reduction in Volume: 28.60% N/A N/A Classification: Full Thickness  Without Full Thickness Without N/A Exposed Support Structures Exposed Support Structures Exudate Amount: None Present Large N/A Exudate Type: N/A Serosanguineous N/A Exudate Color: N/A red, brown N/A Wound Margin: Flat and Intact Distinct, outline attached N/A Granulation Amount: Large (67-100%) Large (67-100%) N/A Granulation Quality: Red Red N/A Necrotic Amount: None Present (0%) Small (1-33%) N/A Exposed Structures: Fascia: No Fascia: No N/A Fat Layer (Subcutaneous Fat Layer (Subcutaneous Tissue) Exposed: No Tissue) Exposed: No Tendon: No Tendon: No Muscle: No Muscle: No Joint: No Joint: No Bone: No Bone: No Epithelialization: None None N/A Periwound Skin Texture: No Abnormalities Noted No Abnormalities Noted N/A Periwound Skin Moisture: No Abnormalities Noted Maceration: Yes N/A Periwound Skin Color: No Abnormalities Noted No Abnormalities Noted N/A Erythema Location: N/A N/A N/A Temperature: No Abnormality N/A N/A Tenderness on Palpation: Yes No N/A Wound Preparation: Ulcer Cleansing: Ulcer Cleansing: N/A Rinsed/Irrigated with Saline Rinsed/Irrigated with Saline Topical Anesthetic Applied: Topical Anesthetic Applied: Other: lidocaine 4% Other: lidocaine 4% Procedures Performed: N/A N/A N/A Treatment Notes Macknight, Kamarie C. ([id number]) Wound #5 (Left, Medial Malleolus) 1. Cleansed with: Clean wound with Normal Saline 3. Peri-wound Care: Moisturizing lotion 4. Dressing Applied: Other dressing (specify in notes) 5. Secondary Dressing Applied ABD Pad 7. Secured with 3 Layer Compression System - Bilateral Notes silvercel, nystatin to non wound area Wound #6 (Right, Medial Lower Leg) 1. Cleansed with: Clean wound with Normal Saline 3. Peri-wound Care: Moisturizing lotion 4. Dressing Applied: Other dressing (specify in notes) 5. Secondary Dressing Applied ABD Pad 7. Secured with 3 Layer Compression System - Bilateral Notes silvercel, nystatin to non  wound area Wound #7 (Right, Anterior Lower Leg) 1. Cleansed with: Clean wound with Normal Saline 3. Peri-wound Care: Moisturizing lotion 4. Dressing Applied: Other dressing (specify in notes) 5. Secondary Dressing Applied ABD Pad 7. Secured with 3 Layer Compression System - Bilateral Notes silvercel, nystatin to non wound area Wound #8 (Right, Lateral Lower Leg) 1. Cleansed with: Clean wound with Normal Saline 3. Peri-wound Care: Moisturizing lotion 4. Dressing Applied: Other dressing (specify in notes) 5. Secondary Dressing Applied ABD Pad Yale, Patton C. ([id number]) 7. Secured with 3 Layer Compression System - Bilateral Notes silvercel, nystatin to non wound area Wound #9 (Left, Medial Lower Leg) 1. Cleansed with: Clean wound with Normal Saline 3. Peri-wound Care: Moisturizing lotion 4. Dressing Applied: Other dressing (specify in notes) 5. Secondary Dressing Applied ABD Pad 7. Secured with 3 Layer Compression System - Bilateral Notes silvercel, nystatin to non wound area Electronic Signature(s) Signed: 11/18/2017 5:42:24 PM By: Baltazar Najjar MD Entered By: Baltazar Najjar on 11/18/2017 17:17:19 Donald Mouse ([id number]) -------------------------------------------------------------------------------- Multi-Disciplinary Care Plan Details Patient Name: [name] C. Date of Service: 11/18/2017 2:45 PM Medical Record Number: [id number] Patient Account Number: [id number] Date of Birth/Sex: [birth year] (84 y.o. Male) Treating RN: Huel Coventry Primary Care Luvern Mischke: Guerry Bruin Other Clinician: Referring Korie Brabson: Guerry Bruin Treating Raylee Strehl/Extender: Altamese [place] in Treatment: 1 Active Inactive ` Abuse / Safety / Falls / Self Care Management Nursing Diagnoses: Impaired physical mobility Potential for falls Goals: Patient will not develop complications from immobility Date Initiated: 11/11/2017 Target Resolution Date:  12/09/2017 Goal Status: Active Patient will remain injury free related to falls Date Initiated: 11/11/2017 Target Resolution Date: 12/09/2017 Goal Status: Active Interventions: Assess fall risk on admission and as needed Notes: ` Nutrition Nursing Diagnoses: Potential for alteratiion in Nutrition/Potential for imbalanced  nutrition Goals: Patient/caregiver agrees to and verbalizes understanding of need to use nutritional supplements and/or vitamins as prescribed Date Initiated: 11/11/2017 Target Resolution Date: 12/10/2017 Goal Status: Active Interventions: Assess patient nutrition upon admission and as needed per policy Notes: ` Orientation to the Wound Care Program Nursing Diagnoses: Knowledge deficit related to the wound healing center program Goals: Patient/caregiver will verbalize understanding of the [facility] [address] RUCKER, PRIDGEON ([id number]) Date Initiated: 11/11/2017 Target Resolution Date: 12/10/2017 Goal Status: Active Interventions: Provide education on orientation to the wound center Notes: ` Venous Leg Ulcer Nursing Diagnoses: Actual venous Insuffiency (use after diagnosis is confirmed) Goals: Non-invasive venous studies are completed as ordered Date Initiated: 11/11/2017 Target Resolution Date: 12/10/2017 Goal Status: Active Interventions: Assess peripheral edema status every visit. Treatment Activities: Non-invasive vascular studies : 11/11/2017 Notes: ` Wound/Skin Impairment Nursing Diagnoses: Impaired tissue integrity Goals: Ulcer/skin breakdown will have a volume reduction of 80% by week 12 Date Initiated: 11/11/2017 Target Resolution Date: 12/10/2017 Goal Status: Active Interventions: Assess patient/caregiver ability to obtain necessary supplies Provide education on ulcer and skin care Treatment Activities: Topical wound management initiated : 11/11/2017 Notes: Electronic Signature(s) Signed: 11/18/2017 5:38:59 PM By: Elliot Gurney, [name], RN, CWS, Kim  RN, [name] Entered By: Elliot Gurney, [name], RN, CWS, Kim on 11/18/2017 15:47:11 Jeanie Sewer, [name] ([id number]) -------------------------------------------------------------------------------- Pain Assessment Details Patient Name: [name] C. Date of Service: 11/18/2017 2:45 PM Medical Record Number: [id number] Patient Account Number: [id number] Date of Birth/Sex: Feb 05, 1929 (84 y.o. Male) Treating RN: Phillis Haggis Primary Care Leontae Bostock: Guerry Bruin Other Clinician: Referring Merary Garguilo: Guerry Bruin Treating Tanny Harnack/Extender: Maxwell Caul Weeks in Treatment: 1 Active Problems Location of Pain Severity and Description of Pain Patient Has Paino No Site Locations Pain Management and Medication Current Pain Management: Electronic Signature(s) Signed: 11/18/2017 4:40:08 PM By: Alejandro Mulling Entered By: Alejandro Mulling on 11/18/2017 15:17:34 Donald Mouse ([id number]) -------------------------------------------------------------------------------- Patient/Caregiver Education Details Patient Name: Donald Mouse. Date of Service: 11/18/2017 2:45 PM Medical Record Number: [id number] Patient Account Number: [id number] Date of Birth/Gender: December 19, 1928 (84 y.o. Male) Treating RN: Curtis Sites Primary Care Physician: Guerry Bruin Other Clinician: Referring Physician: Guerry Bruin Treating Physician/Extender: Altamese [place] in Treatment: 1 Education Assessment Education Provided To: Patient and Caregiver Education Topics Provided Venous: Handouts: Other: leg elevation Methods: Explain/Verbal Responses: State content correctly Electronic Signature(s) Signed: 11/18/2017 5:19:26 PM By: Curtis Sites Entered By: Curtis Sites on 11/18/2017 17:11:45 Donald Mouse ([id number]) -------------------------------------------------------------------------------- Wound Assessment Details Patient Name: [name] C. Date of Service: 11/18/2017  2:45 PM Medical Record Number: [id number] Patient Account Number: [id number] Date of Birth/Sex: Jul 31, 1929 (84 y.o. Male) Treating RN: Phillis Haggis Primary Care Marijah Larranaga: Guerry Bruin Other Clinician: Referring Ilea Hilton: Guerry Bruin Treating Donatello Kleve/Extender: Maxwell Caul Weeks in Treatment: 1 Wound Status Wound Number: 5 Primary Venous Leg Ulcer Etiology: Wound Location: Left Malleolus - Medial Wound Status: Open Wounding Event: Blister Comorbid Cataracts, Arrhythmia, Deep Vein Date Acquired: 10/28/2017 History: Thrombosis, Osteoarthritis Weeks Of Treatment: 1 Clustered Wound: No Photos Photo Uploaded By: Alejandro Mulling on 11/18/2017 17:21:08 Wound Measurements Length: (cm) 1 Width: (cm) 0.8 Depth: (cm) 0.1 Area: (cm) 0.628 Volume: (cm) 0.063 % Reduction in Area: -166.1% % Reduction in Volume: -162.5% Epithelialization: None Tunneling: No Undermining: No Wound Description Full Thickness Without Exposed Support Classification: Structures Wound Margin: Flat and Intact Exudate Large Amount: Exudate Type: Serous Exudate Color: amber Foul Odor After Cleansing: No Slough/Fibrino Yes Wound Bed Granulation Amount: Small (1-33%) Exposed Structure Granulation Quality: Red Fascia Exposed: No Necrotic Amount:  Large (67-100%) Fat Layer (Subcutaneous Tissue) Exposed: No Necrotic Quality: Adherent Slough Tendon Exposed: No Muscle Exposed: No Joint Exposed: No Bone Exposed: No Streetman, Serjio C. ([id number]) Periwound Skin Texture Texture Color No Abnormalities Noted: No No Abnormalities Noted: No Erythema: Yes Moisture Erythema Location: Circumferential No Abnormalities Noted: No Temperature / Pain Temperature: No Abnormality Tenderness on Palpation: Yes Wound Preparation Ulcer Cleansing: Rinsed/Irrigated with Saline Topical Anesthetic Applied: None Electronic Signature(s) Signed: 11/18/2017 4:40:08 PM By: Alejandro Mulling Entered By:  Alejandro Mulling on 11/18/2017 15:27:11 Donald Mouse ([id number]) -------------------------------------------------------------------------------- Wound Assessment Details Patient Name: [name] C. Date of Service: 11/18/2017 2:45 PM Medical Record Number: [id number] Patient Account Number: [id number] Date of Birth/Sex: 1929-03-24 (84 y.o. Male) Treating RN: Phillis Haggis Primary Care Connie Hilgert: Guerry Bruin Other Clinician: Referring Hildur Bayer: Guerry Bruin Treating Geonna Lockyer/Extender: Maxwell Caul Weeks in Treatment: 1 Wound Status Wound Number: 6 Primary Venous Leg Ulcer Etiology: Wound Location: Right Lower Leg - Medial Wound Status: Open Wounding Event: Blister Comorbid Cataracts, Arrhythmia, Deep Vein Date Acquired: 10/28/2017 History: Thrombosis, Osteoarthritis Weeks Of Treatment: 1 Clustered Wound: No Photos Photo Uploaded By: Alejandro Mulling on 11/18/2017 17:21:56 Wound Measurements Length: (cm) 0.5 Width: (cm) 0.3 Depth: (cm) 0.1 Area: (cm) 0.118 Volume: (cm) 0.012 % Reduction in Area: 80% % Reduction in Volume: 79.7% Epithelialization: None Tunneling: No Undermining: No Wound Description Full Thickness Without Exposed Support Classification: Structures Wound Margin: Flat and Intact Exudate Small Amount: Exudate Type: Serosanguineous Exudate Color: red, brown Foul Odor After Cleansing: No Slough/Fibrino No Wound Bed Granulation Amount: None Present (0%) Exposed Structure Necrotic Amount: Large (67-100%) Fascia Exposed: No Necrotic Quality: Adherent Slough Fat Layer (Subcutaneous Tissue) Exposed: No Tendon Exposed: No Muscle Exposed: No Joint Exposed: No Bone Exposed: No Esteve, Antonia C. ([id number]) Periwound Skin Texture Texture Color No Abnormalities Noted: No No Abnormalities Noted: No Erythema: Yes Moisture Erythema Location: Circumferential No Abnormalities Noted: No Temperature / Pain Temperature: No  Abnormality Tenderness on Palpation: Yes Wound Preparation Ulcer Cleansing: Rinsed/Irrigated with Saline Topical Anesthetic Applied: Other: lidocaine 4%, Electronic Signature(s) Signed: 11/18/2017 4:40:08 PM By: Alejandro Mulling Entered By: Alejandro Mulling on 11/18/2017 15:28:17 Donald Mouse ([id number]) -------------------------------------------------------------------------------- Wound Assessment Details Patient Name: [name] C. Date of Service: 11/18/2017 2:45 PM Medical Record Number: [id number] Patient Account Number: [id number] Date of Birth/Sex: 06-03-1929 (84 y.o. Male) Treating RN: Phillis Haggis Primary Care Kashara Blocher: Guerry Bruin Other Clinician: Referring Blane Worthington: Guerry Bruin Treating Kavon Valenza/Extender: Maxwell Caul Weeks in Treatment: 1 Wound Status Wound Number: 7 Primary Venous Leg Ulcer Etiology: Wound Location: Right Lower Leg - Anterior Wound Status: Open Wounding Event: Gradually Appeared Comorbid Cataracts, Arrhythmia, Deep Vein Date Acquired: 10/28/2017 History: Thrombosis, Osteoarthritis Weeks Of Treatment: 1 Clustered Wound: No Photos Photo Uploaded By: Alejandro Mulling on 11/18/2017 17:21:57 Wound Measurements Length: (cm) 0.5 Width: (cm) 0.5 Depth: (cm) 0.1 Area: (cm) 0.196 Volume: (cm) 0.02 % Reduction in Area: 0% % Reduction in Volume: 0% Epithelialization: None Tunneling: No Undermining: No Wound Description Full Thickness Without Exposed Support Classification: Structures Wound Margin: Flat and Intact Exudate Small Amount: Exudate Type: Serosanguineous Exudate Color: red, brown Foul Odor After Cleansing: No Slough/Fibrino No Wound Bed Granulation Amount: Large (67-100%) Exposed Structure Granulation Quality: Pink Fascia Exposed: No Necrotic Amount: None Present (0%) Fat Layer (Subcutaneous Tissue) Exposed: No Tendon Exposed: No Muscle Exposed: No Joint Exposed: No Bone Exposed:  No Ulysse, Reza C. ([id number]) Periwound Skin Texture Texture Color No Abnormalities Noted: No No Abnormalities Noted: No Moisture Temperature / Pain No Abnormalities  Noted: No Temperature: No Abnormality Tenderness on Palpation: Yes Wound Preparation Ulcer Cleansing: Rinsed/Irrigated with Saline Topical Anesthetic Applied: Other: lidocaine 4%, Electronic Signature(s) Signed: 11/18/2017 4:40:08 PM By: Alejandro Mulling Entered By: Alejandro Mulling on 11/18/2017 15:29:25 Donald Mouse ([id number]) -------------------------------------------------------------------------------- Wound Assessment Details Patient Name: [name] C. Date of Service: 11/18/2017 2:45 PM Medical Record Number: [id number] Patient Account Number: [id number] Date of Birth/Sex: Apr 10, 1929 (84 y.o. Male) Treating RN: Phillis Haggis Primary Care Krystin Keeven: Guerry Bruin Other Clinician: Referring Carine Nordgren: Guerry Bruin Treating Marlisha Vanwyk/Extender: Maxwell Caul Weeks in Treatment: 1 Wound Status Wound Number: [id number] Primary Venous Leg Ulcer Etiology: Wound Location: Right Lower Leg - Lateral Wound Status: Open Wounding Event: Blister Comorbid Cataracts, Arrhythmia, Deep Vein Date Acquired: 10/28/2017 History: Thrombosis, Osteoarthritis Weeks Of Treatment: 1 Clustered Wound: No Photos Photo Uploaded By: Alejandro Mulling on 11/18/2017 17:22:45 Wound Measurements Length: (cm) 0.3 Width: (cm) 0.2 Depth: (cm) 0.1 Area: (cm) 0.047 Volume: (cm) 0.005 % Reduction in Area: 33.8% % Reduction in Volume: 28.6% Epithelialization: None Tunneling: No Undermining: No Wound Description Full Thickness Without Exposed Support Classification: Structures Wound Margin: Flat and Intact Exudate None Present Amount: Foul Odor After Cleansing: No Slough/Fibrino Yes Wound Bed Granulation Amount: Large (67-100%) Exposed Structure Granulation Quality: Red Fascia Exposed: No Necrotic Amount:  None Present (0%) Fat Layer (Subcutaneous Tissue) Exposed: No Tendon Exposed: No Muscle Exposed: No Joint Exposed: No Bone Exposed: No Periwound Skin Texture Texture Color Ishikawa, Larren C. ([id number]) No Abnormalities Noted: No No Abnormalities Noted: No Moisture Temperature / Pain No Abnormalities Noted: No Temperature: No Abnormality Tenderness on Palpation: Yes Wound Preparation Ulcer Cleansing: Rinsed/Irrigated with Saline Topical Anesthetic Applied: Other: lidocaine 4%, Electronic Signature(s) Signed: 11/18/2017 4:40:08 PM By: Alejandro Mulling Entered By: Alejandro Mulling on 11/18/2017 15:30:29 Donald Mouse ([id number]) -------------------------------------------------------------------------------- Wound Assessment Details Patient Name: [name] C. Date of Service: 11/18/2017 2:45 PM Medical Record Number: [id number] Patient Account Number: [id number] Date of Birth/Sex: 01/28/1929 (84 y.o. Male) Treating RN: Phillis Haggis Primary Care Clark Cuff: Guerry Bruin Other Clinician: Referring Krisha Beegle: Guerry Bruin Treating Mylo Driskill/Extender: Maxwell Caul Weeks in Treatment: 1 Wound Status Wound Number: 9 Primary Venous Leg Ulcer Etiology: Wound Location: Left Lower Leg - Medial Wound Status: Open Wounding Event: Gradually Appeared Comorbid Cataracts, Arrhythmia, Deep Vein Date Acquired: 11/18/2017 History: Thrombosis, Osteoarthritis Weeks Of Treatment: 0 Clustered Wound: No Photos Photo Uploaded By: Alejandro Mulling on 11/18/2017 17:22:45 Wound Measurements Length: (cm) 0.5 Width: (cm) 1 Depth: (cm) 0.1 Area: (cm) 0.393 Volume: (cm) 0.039 % Reduction in Area: % Reduction in Volume: Epithelialization: None Tunneling: No Undermining: No Wound Description Full Thickness Without Exposed Support Classification: Structures Wound Margin: Distinct, outline attached Exudate Large Amount: Exudate Type: Serosanguineous Exudate  Color: red, brown Foul Odor After Cleansing: No Slough/Fibrino Yes Wound Bed Granulation Amount: Large (67-100%) Exposed Structure Granulation Quality: Red Fascia Exposed: No Necrotic Amount: Small (1-33%) Fat Layer (Subcutaneous Tissue) Exposed: No Necrotic Quality: Adherent Slough Tendon Exposed: No Muscle Exposed: No Joint Exposed: No Bone Exposed: No Fukuda, Cordelle C. ([id number]) Periwound Skin Texture Texture Color No Abnormalities Noted: No No Abnormalities Noted: No Moisture No Abnormalities Noted: No Maceration: Yes Wound Preparation Ulcer Cleansing: Rinsed/Irrigated with Saline Topical Anesthetic Applied: Other: lidocaine 4%, Treatment Notes Wound #9 (Left, Medial Lower Leg) 1. Cleansed with: Clean wound with Normal Saline 3. Peri-wound Care: Moisturizing lotion 4. Dressing Applied: Other dressing (specify in notes) 5. Secondary Dressing Applied ABD Pad 7. Secured with 3 Layer Compression System - Bilateral Notes silvercel, nystatin to  non wound area Electronic Signature(s) Signed: 11/18/2017 4:40:08 PM By: Alejandro Mulling Entered By: Alejandro Mulling on 11/18/2017 15:35:09 Donald Mouse ([id number]) -------------------------------------------------------------------------------- Vitals Details Patient Name: [name] C. Date of Service: 11/18/2017 2:45 PM Medical Record Number: [id number] Patient Account Number: [id number] Date of Birth/Sex: 08/12/1929 (84 y.o. Male) Treating RN: Phillis Haggis Primary Care Aulton Routt: Guerry Bruin Other Clinician: Referring Shakena Callari: Guerry Bruin Treating Daemien Fronczak/Extender: Maxwell Caul Weeks in Treatment: 1 Vital Signs Time Taken: 15:17 Temperature (F): 98.0 Height (in): 68 Pulse (bpm): 102 Weight (lbs): 150 Respiratory Rate (breaths/min): 16 Body Mass Index (BMI): 22.8 Blood Pressure (mmHg): 103/76 Reference Range: 80 - 120 mg / dl Electronic Signature(s) Signed: 11/18/2017 4:40:08  PM By: Alejandro Mulling Entered By: Alejandro Mulling on 11/18/2017 15:19:43

## 2017-11-20 NOTE — Progress Notes (Signed)
Donald, Potts ([id number]) Visit Report for 11/18/2017 HPI Details Patient Name: [name], [name]. Date of Service: 11/18/2017 2:45 PM Medical Record Number: [id number] Patient Account Number: [id number] Date of Birth/Sex: 04-Jun-1929 (84 y.o. Male) Treating RN: Huel Coventry Primary Care Provider: Guerry Bruin Other Clinician: Referring Provider: Guerry Bruin Treating Provider/Extender: Altamese [place] in Treatment: 1 History of Present Illness HPI Description: Pleasant 84 year old with history of chronic venous insufficiency, remote history of DVT, atrial fibrillation (on Coumadin). No history of diabetes. He says that he developed worsening left lower extremity swelling, blisters, and subsequent ulcerations in late October 2016. He was started on Augmentin for left lower extremity cellulitis, which has improved. Arterial ultrasound 07/27/2015 showed no significant peripheral arterial disease. Awaiting venous ultrasound. Performing dressing changes with silver alginate and using a Tubigrip for edema control. He denies any significant pain. No claudication or rest pain. Ambulating per his baseline with a walker. No fever or chills. Moderate drainage. 10/10/15; the patient arrives back today having not been seen in almost 2 months. He has no open wounds on his legs. He has a history of venous insufficiency, arterial insufficiency. He has chronic Coumadin use secondary to atrial fibrillation and a remote history of DVT. Fortunately as he has not been here in 2 months we were not able to order him stockings. He is not able to get on standard compression hose. Juxtalite stockings would cost him probably about $100 appear, the couple was not able to afford this. 11/11/17 READMISSION This is an 84 year old man who is not a diabetic. He does have a history of sick sinus syndrome atrial fibrillation and has a cardiac pacemaker and is on Coumadin.he also has a history of DVT. His wife  states that episodically over the last 2 years he has had wounds on his lower extremities but they have healed with home measures. Roughly 2-3 weeks ago the legs became a lot more swollen and he has had draining areas on the right anterior medial and lateral and the left medial leg. The patient's wife states that they were told to put support stockings on him but they are concerned because they worsen the blisters. this bit difficult to get the history but they think the edema has been increasing over the last several months. 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 He does not complain of chest pain orthopnea. He does not have a history of kidney issues that he is aware. Primary doctor at [facility]. He is not on diuretics. He was here 2 years ago. He was noted to have edema in his legs at that time. He had an ultrasound of his legs which I'll need to review. There was discussion about compression stockings but he is complaining clearly not using them. ABI in the right in this clinic was 1.37, 1.07 on the left 11/18/17; this is an elderly frail man who has bilateral small lower extremity wounds in the setting of very significant multifactorial edema. He probably has chronic venous insufficiency with lymphedema but it is clearly fluid overloaded last week. He apparently has started on diuretics through his primary physician at [facility] but the patient's wife is not sure which one but states that he goes to the bathroom every 15 minutes. He is tolerating compression wraps. His wife stated that they charged him full price stating that he didn't have open wounds on his legs that would cover wraparound stockings. His edema is much  better controlled Electronic Signature(s) Signed: 11/18/2017 5:42:24 PM By: Baltazar Najjar MD Entered By: Baltazar Najjar on 11/18/2017 17:19:14 [name]  ([id number]) -------------------------------------------------------------------------------- Physical Exam Details Patient Name: Donald Musca C. Date of Service: 11/18/2017 2:45 PM Medical Record Number: [id number] Patient Account Number: [id number] Date of Birth/Sex: 09/29/1928 (84 y.o. Male) Treating RN: Huel Coventry Primary Care Provider: Guerry Bruin Other Clinician: Referring Provider: Guerry Bruin Treating Provider/Extender: Maxwell Caul Weeks in Treatment: 1 Constitutional Sitting or standing Blood Pressure is within target range for patient.. Pulse regular and within target range for patient.Marland Kitchen Respirations regular, non-labored and within target range.. Temperature is normal and within the target range for the patient.Marland Kitchen appears in no distress. Eyes Conjunctivae clear. No discharge. Respiratory Respiratory effort is easy and symmetric bilaterally. Rate is normal at rest and on room air.. Bilateral breath sounds are clear and equal in all lobes with no wheezes, rales or rhonchi.. Cardiovascular Heart rhythm and rate regular, without murmur or gallop.JVP is elevated. Pedal pulses are palpable bilaterally. which improved edema control. Gastrointestinal (GI) soft no masses. Lymphatic none palpable in the popliteal or inguinal area. Integumentary (Hair, Skin) very frail skin bilaterally in his lower extremities probably secondary to chronic venous insufficiency. Notes wound exam; there are several tiny open areas on both legs. The worst is on the right lateral and medial malleolus. However there are 3 other small weeping areas on the left medial calf right lateral calf. Very frail skin in the setting of chronic venous insufficiency Electronic Signature(s) Signed: 11/18/2017 5:42:24 PM By: Baltazar Najjar MD Entered By: Baltazar Najjar on 11/18/2017 17:22:09 [name]  ([id number]) -------------------------------------------------------------------------------- Physician Orders Details Patient Name: Donald Musca C. Date of Service: 11/18/2017 2:45 PM Medical Record Number: [id number] Patient Account Number: [id number] Date of Birth/Sex: 14-Feb-1929 (84 y.o. Male) Treating RN: Huel Coventry Primary Care Provider: Guerry Bruin Other Clinician: Referring Provider: Guerry Bruin Treating Provider/Extender: Altamese [place] in Treatment: 1 Verbal / Phone Orders: No Diagnosis Coding Wound Cleansing Wound #5 Left,Medial Malleolus o Cleanse wound with mild soap and water Wound #6 Right,Medial Lower Leg o Cleanse wound with mild soap and water Wound #7 Right,Anterior Lower Leg o Cleanse wound with mild soap and water Wound #8 Right,Lateral Lower Leg o Cleanse wound with mild soap and water Wound #9 Left,Medial Lower Leg o Cleanse wound with mild soap and water Skin Barriers/Peri-Wound Care Wound #5 Left,Medial Malleolus o Moisturizing lotion o Other: - OTC Lotrimin Cream on top of right foot. Wound #6 Right,Medial Lower Leg o Moisturizing lotion o Other: - OTC Lotrimin Cream on top of right foot. Wound #7 Right,Anterior Lower Leg o Moisturizing lotion o Other: - OTC Lotrimin Cream on top of right foot. Wound #8 Right,Lateral Lower Leg o Moisturizing lotion o Other: - OTC Lotrimin Cream on top of right foot. Wound #9 Left,Medial Lower Leg o Moisturizing lotion o Other: - OTC Lotrimin Cream on top of right foot. Primary Wound Dressing Wound #5 Left,Medial Malleolus o [place] Non-Adherent Wound #6 Right,Medial Lower Leg o [place] Non-Adherent Wound #7 Right,Anterior Lower Leg Kuehl, Teren C. ([id number]) o [place] Non-Adherent Wound #8 Right,Lateral Lower Leg o [place] Non-Adherent Wound #9 Left,Medial Lower Leg o [place] Non-Adherent Secondary Dressing Wound #5 Left,Medial  Malleolus o ABD pad Wound #6 Right,Medial Lower Leg o ABD pad Wound #7 Right,Anterior Lower Leg o ABD pad Wound #8 Right,Lateral Lower Leg o ABD pad Wound #9 Left,Medial Lower Leg o ABD pad Dressing Change Frequency Wound #5 Left,Medial Malleolus o  Change Dressing Monday, Wednesday, Friday Wound #6 Right,Medial Lower Leg o Change Dressing Monday, Wednesday, Friday Wound #7 Right,Anterior Lower Leg o Change Dressing Monday, Wednesday, Friday Wound #8 Right,Lateral Lower Leg o Change Dressing Monday, Wednesday, Friday Wound #9 Left,Medial Lower Leg o Change Dressing Monday, Wednesday, Friday Follow-up Appointments Wound #5 Left,Medial Malleolus o Return Appointment in 1 week. Wound #6 Right,Medial Lower Leg o Return Appointment in 1 week. Wound #7 Right,Anterior Lower Leg o Return Appointment in 1 week. Wound #8 Right,Lateral Lower Leg o Return Appointment in 1 week. Wound #9 Left,Medial Lower Leg o Return Appointment in 1 week. LAMBERTO, DINAPOLI ([id number]) Edema Control Wound #5 Left,Medial Malleolus o 3 Layer Compression System - Bilateral o Elevate legs to the level of the heart and pump ankles as often as possible Wound #6 Right,Medial Lower Leg o 3 Layer Compression System - Bilateral o Elevate legs to the level of the heart and pump ankles as often as possible Wound #7 Right,Anterior Lower Leg o 3 Layer Compression System - Bilateral o Elevate legs to the level of the heart and pump ankles as often as possible Wound #8 Right,Lateral Lower Leg o 3 Layer Compression System - Bilateral o Elevate legs to the level of the heart and pump ankles as often as possible Wound #9 Left,Medial Lower Leg o 3 Layer Compression System - Bilateral o Elevate legs to the level of the heart and pump ankles as often as possible Additional Orders / Instructions Wound #5 Left,Medial Malleolus o Vitamin A; Vitamin C, Zinc o  Activity as tolerated Wound #6 Right,Medial Lower Leg o Vitamin A; Vitamin C, Zinc o Activity as tolerated Wound #7 Right,Anterior Lower Leg o Vitamin A; Vitamin C, Zinc o Activity as tolerated Wound #8 Right,Lateral Lower Leg o Vitamin A; Vitamin C, Zinc o Activity as tolerated Wound #9 Left,Medial Lower Leg o Vitamin A; Vitamin C, Zinc o Activity as tolerated Home Health Wound #5 Left,Medial Malleolus o Initiate Home Health for Skilled Nursing o Home Health Nurse may visit PRN to address patientos wound care needs. o FACE TO FACE ENCOUNTER: MEDICARE and MEDICAID PATIENTS: I certify that this patient is under my care and that I had a face-to-face encounter that meets the physician face-to-face encounter requirements with this patient on this date. The encounter with the patient was in whole or in part for the following MEDICAL CONDITION: (primary reason for Home Healthcare) MEDICAL NECESSITY: I certify, that based on my findings, NURSING services are a medically necessary home health service. HOME BOUND STATUS: I certify that my clinical findings support that this patient is homebound (i.e., Due to illness or injury, pt requires aid of supportive devices such as crutches, cane, wheelchairs, walkers, the use of special transportation or the assistance of another person to leave their place of residence. There is a normal inability to leave the home and doing so requires considerable and taxing effort. Other absences are for medical reasons / religious services and are infrequent or of short duration when for other reasons). XYLER, TERPENING ([id number]) o If current dressing causes regression in wound condition, may D/C ordered dressing product/s and apply Normal Saline Moist Dressing daily until next Wound Healing Center / Other MD appointment. Notify Wound Healing Center of regression in wound condition at [phone]. o Please direct any NON-WOUND  related issues/requests for orders to patient's Primary Care Physician Wound #6 Right,Medial Lower Leg o Initiate Home Health for Skilled Nursing o Home Health Nurse may visit PRN to address  patientos wound care needs. o FACE TO FACE ENCOUNTER: MEDICARE and MEDICAID PATIENTS: I certify that this patient is under my care and that I had a face-to-face encounter that meets the physician face-to-face encounter requirements with this patient on this date. The encounter with the patient was in whole or in part for the following MEDICAL CONDITION: (primary reason for Home Healthcare) MEDICAL NECESSITY: I certify, that based on my findings, NURSING services are a medically necessary home health service. HOME BOUND STATUS: I certify that my clinical findings support that this patient is homebound (i.e., Due to illness or injury, pt requires aid of supportive devices such as crutches, cane, wheelchairs, walkers, the use of special transportation or the assistance of another person to leave their place of residence. There is a normal inability to leave the home and doing so requires considerable and taxing effort. Other absences are for medical reasons / religious services and are infrequent or of short duration when for other reasons). o If current dressing causes regression in wound condition, may D/C ordered dressing product/s and apply Normal Saline Moist Dressing daily until next Wound Healing Center / Other MD appointment. Notify Wound Healing Center of regression in wound condition at [phone]. o Please direct any NON-WOUND related issues/requests for orders to patient's Primary Care Physician Wound #7 Right,Anterior Lower Leg o Initiate Home Health for Skilled Nursing o Home Health Nurse may visit PRN to address patientos wound care needs. o FACE TO FACE ENCOUNTER: MEDICARE and MEDICAID PATIENTS: I certify that this patient is under my care and that I had a face-to-face  encounter that meets the physician face-to-face encounter requirements with this patient on this date. The encounter with the patient was in whole or in part for the following MEDICAL CONDITION: (primary reason for Home Healthcare) MEDICAL NECESSITY: I certify, that based on my findings, NURSING services are a medically necessary home health service. HOME BOUND STATUS: I certify that my clinical findings support that this patient is homebound (i.e., Due to illness or injury, pt requires aid of supportive devices such as crutches, cane, wheelchairs, walkers, the use of special transportation or the assistance of another person to leave their place of residence. There is a normal inability to leave the home and doing so requires considerable and taxing effort. Other absences are for medical reasons / religious services and are infrequent or of short duration when for other reasons). o If current dressing causes regression in wound condition, may D/C ordered dressing product/s and apply Normal Saline Moist Dressing daily until next Wound Healing Center / Other MD appointment. Notify Wound Healing Center of regression in wound condition at [phone]. o Please direct any NON-WOUND related issues/requests for orders to patient's Primary Care Physician Wound #8 Right,Lateral Lower Leg o Initiate Home Health for Skilled Nursing o Home Health Nurse may visit PRN to address patientos wound care needs. o FACE TO FACE ENCOUNTER: MEDICARE and MEDICAID PATIENTS: I certify that this patient is under my care and that I had a face-to-face encounter that meets the physician face-to-face encounter requirements with this patient on this date. The encounter with the patient was in whole or in part for the following MEDICAL CONDITION: (primary reason for Home Healthcare) MEDICAL NECESSITY: I certify, that based on my findings, NURSING services are a medically necessary home health service. HOME BOUND  STATUS: I certify that my clinical findings support that this patient is homebound (i.e., Due to illness or injury, pt requires aid of supportive devices such as  crutches, cane, wheelchairs, walkers, the use of special transportation or the assistance of another person to leave their place of residence. There is a normal inability to leave the home and doing so requires considerable and taxing effort. Other absences are for medical reasons / religious services and are infrequent or of short duration when for other reasons). o If current dressing causes regression in wound condition, may D/C ordered dressing product/s and apply Normal Saline Moist Dressing daily until next Wound Healing Center / Other MD appointment. Notify Wound Healing Center of regression in wound condition at [phone]. o Please direct any NON-WOUND related issues/requests for orders to patient's Primary Care Physician SKANDA, WORLDS ([id number]) Wound #9 Left,Medial Lower Leg o Initiate Home Health for Skilled Nursing o Home Health Nurse may visit PRN to address patientos wound care needs. o FACE TO FACE ENCOUNTER: MEDICARE and MEDICAID PATIENTS: I certify that this patient is under my care and that I had a face-to-face encounter that meets the physician face-to-face encounter requirements with this patient on this date. The encounter with the patient was in whole or in part for the following MEDICAL CONDITION: (primary reason for Home Healthcare) MEDICAL NECESSITY: I certify, that based on my findings, NURSING services are a medically necessary home health service. HOME BOUND STATUS: I certify that my clinical findings support that this patient is homebound (i.e., Due to illness or injury, pt requires aid of supportive devices such as crutches, cane, wheelchairs, walkers, the use of special transportation or the assistance of another person to leave their place of residence. There is a normal inability to  leave the home and doing so requires considerable and taxing effort. Other absences are for medical reasons / religious services and are infrequent or of short duration when for other reasons). o If current dressing causes regression in wound condition, may D/C ordered dressing product/s and apply Normal Saline Moist Dressing daily until next Wound Healing Center / Other MD appointment. Notify Wound Healing Center of regression in wound condition at [phone]. o Please direct any NON-WOUND related issues/requests for orders to patient's Primary Care Physician Electronic Signature(s) Signed: 11/18/2017 5:38:59 PM By: Elliot Gurney, [name], RN, CWS, [name], [name] Signed: 11/18/2017 5:42:24 PM By: Baltazar Najjar MD Entered By: Elliot Gurney, [name], RN, CWS, Kim on 11/18/2017 15:48:37 [name] ([id number]) -------------------------------------------------------------------------------- Problem List Details Patient Name: ASHTAN, GIRTMAN. Date of Service: 11/18/2017 2:45 PM Medical Record Number: [id number] Patient Account Number: [id number] Date of Birth/Sex: 1929/08/28 (84 y.o. Male) Treating RN: Huel Coventry Primary Care Provider: Guerry Bruin Other Clinician: Referring Provider: Guerry Bruin Treating Provider/Extender: Altamese [place] in Treatment: 1 Active Problems ICD-10 Encounter Code Description Active Date Diagnosis L97.211 Non-pressure chronic ulcer of right calf limited to breakdown of skin 11/11/2017 Yes L97.221 Non-pressure chronic ulcer of left calf limited to breakdown of skin 11/11/2017 Yes I87.333 Chronic venous hypertension (idiopathic) with ulcer and 11/11/2017 Yes inflammation of bilateral lower extremity Inactive Problems Resolved Problems Electronic Signature(s) Signed: 11/18/2017 5:42:24 PM By: Baltazar Najjar MD Entered By: Baltazar Najjar on 11/18/2017 17:17:09 [name]  ([id number]) -------------------------------------------------------------------------------- Progress Note Details Patient Name: Donald Musca C. Date of Service: 11/18/2017 2:45 PM Medical Record Number: [id number] Patient Account Number: [id number] Date of Birth/Sex: February 20, 1929 (84 y.o. Male) Treating RN: Huel Coventry Primary Care Provider: Guerry Bruin Other Clinician: Referring Provider: Guerry Bruin Treating Provider/Extender: Maxwell Caul Weeks in Treatment: 1 Subjective History of Present Illness (HPI) Pleasant 84 year old with history of chronic venous insufficiency, remote history  of DVT, atrial fibrillation (on Coumadin). No history of diabetes. He says that he developed worsening left lower extremity swelling, blisters, and subsequent ulcerations in late October 2016. He was started on Augmentin for left lower extremity cellulitis, which has improved. Arterial ultrasound 07/27/2015 showed no significant peripheral arterial disease. Awaiting venous ultrasound. Performing dressing changes with silver alginate and using a Tubigrip for edema control. He denies any significant pain. No claudication or rest pain. Ambulating per his baseline with a walker. No fever or chills. Moderate drainage. 10/10/15; the patient arrives back today having not been seen in almost 2 months. He has no open wounds on his legs. He has a history of venous insufficiency, arterial insufficiency. He has chronic Coumadin use secondary to atrial fibrillation and a remote history of DVT. Fortunately as he has not been here in 2 months we were not able to order him stockings. He is not able to get on standard compression hose. Juxtalite stockings would cost him probably about $100 appear, the couple was not able to afford this. 11/11/17 READMISSION This is an 84 year old man who is not a diabetic. He does have a history of sick sinus syndrome atrial fibrillation and has a cardiac pacemaker and is on  Coumadin.he also has a history of DVT. His wife states that episodically over the last 2 years he has had wounds on his lower extremities but they have healed with home measures. Roughly 2-3 weeks ago the legs became a lot more swollen and he has had draining areas on the right anterior medial and lateral and the left medial leg. The patient's wife states that they were told to put support stockings on him but they are concerned because they worsen the blisters. this bit difficult to get the history but they think the edema has been increasing over the last several months. 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 He does not complain of chest pain orthopnea. He does not have a history of kidney issues that he is aware. [facility] He is not on diuretics. He was here 2 years ago. He was noted to have edema in his legs at that time. He had an ultrasound of his legs which I'll need to review. There was discussion about compression stockings but he is complaining clearly not using them. ABI in the right in this clinic was 1.37, 1.07 on the left 11/18/17; this is an elderly frail man who has bilateral small lower extremity wounds in the setting of very significant multifactorial edema. He probably has chronic venous insufficiency with lymphedema but it is clearly fluid overloaded last week. He apparently has started on [facility] but the patient's wife is not sure which one but states that he goes to the bathroom every 15 minutes. He is tolerating compression wraps. His wife stated that they charged him full price stating that he didn't have open wounds on his legs that would cover wraparound stockings. His edema is much better controlled Objective Dineen, Ayad C. ([id number]) Constitutional Sitting or standing Blood Pressure is within target range for patient.. Pulse regular and  within target range for patient.Marland Kitchen. Respirations regular, non-labored and within target range.. Temperature is normal and within the target range for the patient.Marland Kitchen. appears in no distress. Vitals Time Taken: 3:17 PM, Height: 68 in, Weight: 150 lbs, BMI: 22.8, Temperature: 98.0 F, Pulse: 102 bpm, Respiratory Rate: 16 breaths/min, Blood Pressure: 103/76  mmHg. Eyes Conjunctivae clear. No discharge. Respiratory Respiratory effort is easy and symmetric bilaterally. Rate is normal at rest and on room air.. Bilateral breath sounds are clear and equal in all lobes with no wheezes, rales or rhonchi.. Cardiovascular Heart rhythm and rate regular, without murmur or gallop.JVP is elevated. Pedal pulses are palpable bilaterally. which improved edema control. Gastrointestinal (GI) soft no masses. Lymphatic none palpable in the popliteal or inguinal area. General Notes: wound exam; there are several tiny open areas on both legs. The worst is on the right lateral and medial malleolus. However there are 3 other small weeping areas on the left medial calf right lateral calf. Very frail skin in the setting of chronic venous insufficiency Integumentary (Hair, Skin) very frail skin bilaterally in his lower extremities probably secondary to chronic venous insufficiency. Wound #5 status is Open. Original cause of wound was Blister. The wound is located on the Left,Medial Malleolus. The wound measures 1cm length x 0.8cm width x 0.1cm depth; 0.628cm^2 area and 0.063cm^3 volume. There is no tunneling or undermining noted. There is a large amount of serous drainage noted. The wound margin is flat and intact. There is small (1-33%) red granulation within the wound bed. There is a large (67-100%) amount of necrotic tissue within the wound bed including Adherent Slough. The periwound skin appearance exhibited: Erythema. The surrounding wound skin color is noted with erythema which is circumferential. Periwound  temperature was noted as No Abnormality. The periwound has tenderness on palpation. Wound #6 status is Open. Original cause of wound was Blister. The wound is located on the Right,Medial Lower Leg. The wound measures 0.5cm length x 0.3cm width x 0.1cm depth; 0.118cm^2 area and 0.012cm^3 volume. There is no tunneling or undermining noted. There is a small amount of serosanguineous drainage noted. The wound margin is flat and intact. There is no granulation within the wound bed. There is a large (67-100%) amount of necrotic tissue within the wound bed including Adherent Slough. The periwound skin appearance exhibited: Erythema. The surrounding wound skin color is noted with erythema which is circumferential. Periwound temperature was noted as No Abnormality. The periwound has tenderness on palpation. Wound #7 status is Open. Original cause of wound was Gradually Appeared. The wound is located on the Right,Anterior Lower Leg. The wound measures 0.5cm length x 0.5cm width x 0.1cm depth; 0.196cm^2 area and 0.02cm^3 volume. There is no tunneling or undermining noted. There is a small amount of serosanguineous drainage noted. The wound margin is flat and intact. There is large (67-100%) pink granulation within the wound bed. There is no necrotic tissue within the wound bed. Periwound temperature was noted as No Abnormality. The periwound has tenderness on palpation. Wound #8 status is Open. Original cause of wound was Blister. The wound is located on the Right,Lateral Lower Leg. The wound measures 0.3cm length x 0.2cm width x 0.1cm depth; 0.047cm^2 area and 0.005cm^3 volume. There is no tunneling or undermining noted. There is a none present amount of drainage noted. The wound margin is flat and intact. There is large Allende, Mena C. ([id number]) (67-100%) red granulation within the wound bed. There is no necrotic tissue within the wound bed. Periwound temperature was noted as No Abnormality. The  periwound has tenderness on palpation. Wound #9 status is Open. Original cause of wound was Gradually Appeared. The wound is located on the Left,Medial Lower Leg. The wound measures 0.5cm length x 1cm width x 0.1cm depth; 0.393cm^2 area and 0.039cm^3 volume. There is no tunneling or  undermining noted. There is a large amount of serosanguineous drainage noted. The wound margin is distinct with the outline attached to the wound base. There is large (67-100%) red granulation within the wound bed. There is a small (1-33%) amount of necrotic tissue within the wound bed including Adherent Slough. The periwound skin appearance exhibited: Maceration. Assessment Active Problems ICD-10 L97.211 - Non-pressure chronic ulcer of right calf limited to breakdown of skin L97.221 - Non-pressure chronic ulcer of left calf limited to breakdown of skin I87.333 - Chronic venous hypertension (idiopathic) with ulcer and inflammation of bilateral lower extremity Procedures Wound #5 Pre-procedure diagnosis of Wound #5 is a Venous Leg Ulcer located on the Left,Medial Malleolus . There was a Three Layer Compression Therapy Procedure with a pre-treatment ABI of 1.3 by Huel Coventry, RN. Post procedure Diagnosis Wound #5: Same as Pre-Procedure Wound #6 Pre-procedure diagnosis of Wound #6 is a Venous Leg Ulcer located on the Right,Medial Lower Leg . There was a Three Layer Compression Therapy Procedure with a pre-treatment ABI of 1.3 by Huel Coventry, RN. Post procedure Diagnosis Wound #6: Same as Pre-Procedure Plan Wound Cleansing: Wound #5 Left,Medial Malleolus: Cleanse wound with mild soap and water Wound #6 Right,Medial Lower Leg: Cleanse wound with mild soap and water Wound #7 Right,Anterior Lower Leg: Cleanse wound with mild soap and water Wound #8 Right,Lateral Lower Leg: Cleanse wound with mild soap and water Wound #9 Left,Medial Lower Leg: Cleanse wound with mild soap and water Rosiles, Corde C.  ([id number]) Skin Barriers/Peri-Wound Care: Wound #5 Left,Medial Malleolus: Moisturizing lotion Other: - OTC Lotrimin Cream on top of right foot. Wound #6 Right,Medial Lower Leg: Moisturizing lotion Other: - OTC Lotrimin Cream on top of right foot. Wound #7 Right,Anterior Lower Leg: Moisturizing lotion Other: - OTC Lotrimin Cream on top of right foot. Wound #8 Right,Lateral Lower Leg: Moisturizing lotion Other: - OTC Lotrimin Cream on top of right foot. Wound #9 Left,Medial Lower Leg: Moisturizing lotion Other: - OTC Lotrimin Cream on top of right foot. Primary Wound Dressing: Wound #5 Left,Medial Malleolus: Silvercel Non-Adherent Wound #6 Right,Medial Lower Leg: Silvercel Non-Adherent Wound #7 Right,Anterior Lower Leg: Silvercel Non-Adherent Wound #8 Right,Lateral Lower Leg: Silvercel Non-Adherent Wound #9 Left,Medial Lower Leg: Silvercel Non-Adherent Secondary Dressing: Wound #5 Left,Medial Malleolus: ABD pad Wound #6 Right,Medial Lower Leg: ABD pad Wound #7 Right,Anterior Lower Leg: ABD pad Wound #8 Right,Lateral Lower Leg: ABD pad Wound #9 Left,Medial Lower Leg: ABD pad Dressing Change Frequency: Wound #5 Left,Medial Malleolus: Change Dressing Monday, Wednesday, Friday Wound #6 Right,Medial Lower Leg: Change Dressing Monday, Wednesday, Friday Wound #7 Right,Anterior Lower Leg: Change Dressing Monday, Wednesday, Friday Wound #8 Right,Lateral Lower Leg: Change Dressing Monday, Wednesday, Friday Wound #9 Left,Medial Lower Leg: Change Dressing Monday, Wednesday, Friday Follow-up Appointments: Wound #5 Left,Medial Malleolus: Return Appointment in 1 week. Wound #6 Right,Medial Lower Leg: Return Appointment in 1 week. Wound #7 Right,Anterior Lower Leg: Return Appointment in 1 week. Wound #8 Right,Lateral Lower Leg: Return Appointment in 1 week. Wound #9 Left,Medial Lower Leg: Return Appointment in 1 week. TAYTON, DECAIRE ([id number]) Edema Control: Wound  #5 Left,Medial Malleolus: 3 Layer Compression System - Bilateral Elevate legs to the level of the heart and pump ankles as often as possible Wound #6 Right,Medial Lower Leg: 3 Layer Compression System - Bilateral Elevate legs to the level of the heart and pump ankles as often as possible Wound #7 Right,Anterior Lower Leg: 3 Layer Compression System - Bilateral Elevate legs to the level of the heart and pump ankles  as often as possible Wound #8 Right,Lateral Lower Leg: 3 Layer Compression System - Bilateral Elevate legs to the level of the heart and pump ankles as often as possible Wound #9 Left,Medial Lower Leg: 3 Layer Compression System - Bilateral Elevate legs to the level of the heart and pump ankles as often as possible Additional Orders / Instructions: Wound #5 Left,Medial Malleolus: Vitamin A; Vitamin C, Zinc Activity as tolerated Wound #6 Right,Medial Lower Leg: Vitamin A; Vitamin C, Zinc Activity as tolerated Wound #7 Right,Anterior Lower Leg: Vitamin A; Vitamin C, Zinc Activity as tolerated Wound #8 Right,Lateral Lower Leg: Vitamin A; Vitamin C, Zinc Activity as tolerated Wound #9 Left,Medial Lower Leg: Vitamin A; Vitamin C, Zinc Activity as tolerated Home Health: Wound #5 Left,Medial Malleolus: Initiate Home Health for Skilled Nursing Home Health Nurse may visit PRN to address patient s wound care needs. FACE TO FACE ENCOUNTER: MEDICARE and MEDICAID PATIENTS: I certify that this patient is under my care and that I had a face-to-face encounter that meets the physician face-to-face encounter requirements with this patient on this date. The encounter with the patient was in whole or in part for the following MEDICAL CONDITION: (primary reason for Home Healthcare) MEDICAL NECESSITY: I certify, that based on my findings, NURSING services are a medically necessary home health service. HOME BOUND STATUS: I certify that my clinical findings support that this patient is  homebound (i.e., Due to illness or injury, pt requires aid of supportive devices such as crutches, cane, wheelchairs, walkers, the use of special transportation or the assistance of another person to leave their place of residence. There is a normal inability to leave the home and doing so requires considerable and taxing effort. Other absences are for medical reasons / religious services and are infrequent or of short duration when for other reasons). If current dressing causes regression in wound condition, may D/C ordered dressing product/s and apply Normal Saline Moist Dressing daily until next Wound Healing Center / Other MD appointment. Notify Wound Healing Center of regression in wound condition at [phone]. Please direct any NON-WOUND related issues/requests for orders to patient's Primary Care Physician Wound #6 Right,Medial Lower Leg: Initiate Home Health for Skilled Nursing Home Health Nurse may visit PRN to address patient s wound care needs. FACE TO FACE ENCOUNTER: MEDICARE and MEDICAID PATIENTS: I certify that this patient is under my care and that I had a face-to-face encounter that meets the physician face-to-face encounter requirements with this patient on this date. The encounter with the patient was in whole or in part for the following MEDICAL CONDITION: (primary reason for Home Healthcare) MEDICAL NECESSITY: I certify, that based on my findings, NURSING services are a medically necessary home health service. HOME BOUND STATUS: I certify that my clinical findings support that this patient is homebound (i.e., Due to illness or injury, pt requires aid of supportive devices such as crutches, cane, wheelchairs, walkers, the use of special transportation or the assistance of another person to leave their place of residence. There is a normal inability to leave the home and doing so requires considerable and taxing effort. Other absences are for medical reasons / religious  services and KRISHAWN, VANDERWEELE. ([id number]) are infrequent or of short duration when for other reasons). If current dressing causes regression in wound condition, may D/C ordered dressing product/s and apply Normal Saline Moist Dressing daily until next Wound Healing Center / Other MD appointment. Notify Wound Healing Center of regression in wound condition at [phone]. Please direct  any NON-WOUND related issues/requests for orders to patient's Primary Care Physician Wound #7 Right,Anterior Lower Leg: Initiate Home Health for Skilled Nursing Home Health Nurse may visit PRN to address patient s wound care needs. FACE TO FACE ENCOUNTER: MEDICARE and MEDICAID PATIENTS: I certify that this patient is under my care and that I had a face-to-face encounter that meets the physician face-to-face encounter requirements with this patient on this date. The encounter with the patient was in whole or in part for the following MEDICAL CONDITION: (primary reason for Home Healthcare) MEDICAL NECESSITY: I certify, that based on my findings, NURSING services are a medically necessary home health service. HOME BOUND STATUS: I certify that my clinical findings support that this patient is homebound (i.e., Due to illness or injury, pt requires aid of supportive devices such as crutches, cane, wheelchairs, walkers, the use of special transportation or the assistance of another person to leave their place of residence. There is a normal inability to leave the home and doing so requires considerable and taxing effort. Other absences are for medical reasons / religious services and are infrequent or of short duration when for other reasons). If current dressing causes regression in wound condition, may D/C ordered dressing product/s and apply Normal Saline Moist Dressing daily until next Wound Healing Center / Other MD appointment. Notify Wound Healing Center of regression in wound condition at [phone]. Please  direct any NON-WOUND related issues/requests for orders to patient's Primary Care Physician Wound #8 Right,Lateral Lower Leg: Initiate Home Health for Skilled Nursing Home Health Nurse may visit PRN to address patient s wound care needs. FACE TO FACE ENCOUNTER: MEDICARE and MEDICAID PATIENTS: I certify that this patient is under my care and that I had a face-to-face encounter that meets the physician face-to-face encounter requirements with this patient on this date. The encounter with the patient was in whole or in part for the following MEDICAL CONDITION: (primary reason for Home Healthcare) MEDICAL NECESSITY: I certify, that based on my findings, NURSING services are a medically necessary home health service. HOME BOUND STATUS: I certify that my clinical findings support that this patient is homebound (i.e., Due to illness or injury, pt requires aid of supportive devices such as crutches, cane, wheelchairs, walkers, the use of special transportation or the assistance of another person to leave their place of residence. There is a normal inability to leave the home and doing so requires considerable and taxing effort. Other absences are for medical reasons / religious services and are infrequent or of short duration when for other reasons). If current dressing causes regression in wound condition, may D/C ordered dressing product/s and apply Normal Saline Moist Dressing daily until next Wound Healing Center / Other MD appointment. Notify Wound Healing Center of regression in wound condition at [phone]. Please direct any NON-WOUND related issues/requests for orders to patient's Primary Care Physician Wound #9 Left,Medial Lower Leg: Initiate Home Health for Skilled Nursing Home Health Nurse may visit PRN to address patient s wound care needs. FACE TO FACE ENCOUNTER: MEDICARE and MEDICAID PATIENTS: I certify that this patient is under my care and that I had a face-to-face encounter that  meets the physician face-to-face encounter requirements with this patient on this date. The encounter with the patient was in whole or in part for the following MEDICAL CONDITION: (primary reason for Home Healthcare) MEDICAL NECESSITY: I certify, that based on my findings, NURSING services are a medically necessary home health service. HOME BOUND STATUS: I certify that my  clinical findings support that this patient is homebound (i.e., Due to illness or injury, pt requires aid of supportive devices such as crutches, cane, wheelchairs, walkers, the use of special transportation or the assistance of another person to leave their place of residence. There is a normal inability to leave the home and doing so requires considerable and taxing effort. Other absences are for medical reasons / religious services and are infrequent or of short duration when for other reasons). If current dressing causes regression in wound condition, may D/C ordered dressing product/s and apply Normal Saline Moist Dressing daily until next Wound Healing Center / Other MD appointment. Notify Wound Healing Center of regression in wound condition at [phone]. Please direct any NON-WOUND related issues/requests for orders to patient's Primary Care Physician NATTHEW, MARLATT ([id number]) #1 the patient's edema in his lower extremities is much better and I think we should continue with silver alginate to the small open areas under 3 layer compression #2apparently the patient was able to get oral diuretics from primary physician over the phone at my suggestion #3 for to his leg he has an appointment with the heart failure clinic at [facility] tomorrow #4 I don't believe there is a predominant arterial issue #5 with the assistance of our charge nurse we were able to phone the wound care supplier [employer] and verified that the patient has wounds on his bilateral legs that justify wraparound stockings. The patient was therefore  able to be reimbursed $122 Electronic Signature(s) Signed: 11/18/2017 5:42:24 PM By: Baltazar Najjar MD Entered By: Baltazar Najjar on 11/18/2017 17:24:10 [name] ([id number]) -------------------------------------------------------------------------------- SuperBill Details Patient Name: Donald Musca C. Date of Service: 11/18/2017 Medical Record Number: [id number] Patient Account Number: [id number] Date of Birth/Sex: April 29, 1929 (84 y.o. Male) Treating RN: Huel Coventry Primary Care Provider: Guerry Bruin Other Clinician: Referring Provider: Guerry Bruin Treating Provider/Extender: Maxwell Caul Weeks in Treatment: 1 Diagnosis Coding ICD-10 Codes Code Description [phone] Non-pressure chronic ulcer of right calf limited to breakdown of skin L97.221 Non-pressure chronic ulcer of left calf limited to breakdown of skin I87.333 Chronic venous hypertension (idiopathic) with ulcer and inflammation of bilateral lower extremity Facility Procedures CPT4: Description Modifier Quantity Code [id number] BILATERAL: Application of multi-layer venous compression system; leg (below 1 knee), including ankle and foot. ICD-10 Diagnosis Description L97.211 Non-pressure chronic ulcer of right calf limited to breakdown of skin L97.221  Non-pressure chronic ulcer of left calf limited to breakdown of skin I87.333 Chronic venous hypertension (idiopathic) with ulcer and inflammation of bilateral lower extremity Physician Procedures CPT4: Description Modifier Quantity Code [id number] - WC PHYS LEVEL 4 - EST PT 1 ICD-10 Diagnosis Description L97.211 Non-pressure chronic ulcer of right calf limited to breakdown of skin L97.221 Non-pressure chronic ulcer of left calf limited to  breakdown of skin I87.333 Chronic venous hypertension (idiopathic) with ulcer and inflammation of bilateral lower extremity CPT4: 87564 BILATERAL: Application of multi-layer venous compression system; leg (below  knee), 1 including ankle and foot. ICD-10 Diagnosis Description L97.211 Non-pressure chronic ulcer of right calf limited to breakdown of skin L97.221 Non-pressure chronic ulcer  of left calf limited to breakdown of skin I87.333 Chronic venous hypertension (idiopathic) with ulcer and inflammation of bilateral lower extremity Electronic Signature(s) Signed: 11/18/2017 5:42:24 PM By: Baltazar Najjar MD Entered By: Baltazar Najjar on 11/18/2017 17:24:43

## 2017-11-23 DIAGNOSIS — L97321 Non-pressure chronic ulcer of left ankle limited to breakdown of skin: Secondary | ICD-10-CM | POA: Diagnosis not present

## 2017-11-23 DIAGNOSIS — L97221 Non-pressure chronic ulcer of left calf limited to breakdown of skin: Secondary | ICD-10-CM | POA: Diagnosis not present

## 2017-11-23 DIAGNOSIS — I10 Essential (primary) hypertension: Secondary | ICD-10-CM | POA: Diagnosis not present

## 2017-11-23 DIAGNOSIS — I87333 Chronic venous hypertension (idiopathic) with ulcer and inflammation of bilateral lower extremity: Secondary | ICD-10-CM | POA: Diagnosis not present

## 2017-11-23 DIAGNOSIS — L97211 Non-pressure chronic ulcer of right calf limited to breakdown of skin: Secondary | ICD-10-CM | POA: Diagnosis not present

## 2017-11-23 DIAGNOSIS — I4891 Unspecified atrial fibrillation: Secondary | ICD-10-CM | POA: Diagnosis not present

## 2017-11-24 ENCOUNTER — Encounter: Payer: Self-pay | Admitting: Internal Medicine

## 2017-11-25 ENCOUNTER — Encounter: Payer: Medicare Other | Admitting: Nurse Practitioner

## 2017-11-25 DIAGNOSIS — L97211 Non-pressure chronic ulcer of right calf limited to breakdown of skin: Secondary | ICD-10-CM | POA: Diagnosis not present

## 2017-11-25 DIAGNOSIS — I87312 Chronic venous hypertension (idiopathic) with ulcer of left lower extremity: Secondary | ICD-10-CM | POA: Diagnosis not present

## 2017-11-25 DIAGNOSIS — Z86718 Personal history of other venous thrombosis and embolism: Secondary | ICD-10-CM | POA: Diagnosis not present

## 2017-11-25 DIAGNOSIS — I872 Venous insufficiency (chronic) (peripheral): Secondary | ICD-10-CM | POA: Diagnosis not present

## 2017-11-25 DIAGNOSIS — I4891 Unspecified atrial fibrillation: Secondary | ICD-10-CM | POA: Diagnosis not present

## 2017-11-25 DIAGNOSIS — M1990 Unspecified osteoarthritis, unspecified site: Secondary | ICD-10-CM | POA: Diagnosis not present

## 2017-11-25 DIAGNOSIS — I499 Cardiac arrhythmia, unspecified: Secondary | ICD-10-CM | POA: Diagnosis not present

## 2017-11-25 DIAGNOSIS — L97322 Non-pressure chronic ulcer of left ankle with fat layer exposed: Secondary | ICD-10-CM | POA: Diagnosis not present

## 2017-11-28 DIAGNOSIS — L97321 Non-pressure chronic ulcer of left ankle limited to breakdown of skin: Secondary | ICD-10-CM | POA: Diagnosis not present

## 2017-11-28 DIAGNOSIS — I10 Essential (primary) hypertension: Secondary | ICD-10-CM | POA: Diagnosis not present

## 2017-11-28 DIAGNOSIS — L97221 Non-pressure chronic ulcer of left calf limited to breakdown of skin: Secondary | ICD-10-CM | POA: Diagnosis not present

## 2017-11-28 DIAGNOSIS — I4891 Unspecified atrial fibrillation: Secondary | ICD-10-CM | POA: Diagnosis not present

## 2017-11-28 DIAGNOSIS — L97211 Non-pressure chronic ulcer of right calf limited to breakdown of skin: Secondary | ICD-10-CM | POA: Diagnosis not present

## 2017-11-28 DIAGNOSIS — I87333 Chronic venous hypertension (idiopathic) with ulcer and inflammation of bilateral lower extremity: Secondary | ICD-10-CM | POA: Diagnosis not present

## 2017-11-29 NOTE — Progress Notes (Signed)
VARNELL, ORVIS ([id number]) Visit Report for 11/25/2017 Arrival Information Details Patient Name: [name]. Date of Service: 11/25/2017 2:45 PM Medical Record Number: [id number] Patient Account Number: [id number] Date of Birth/Sex: [birth year] (84 y.o. M) Treating RN: Huel Coventry Primary Care Evyn Putzier: Guerry Bruin Other Clinician: Referring Reona Zendejas: Guerry Bruin Treating Staton Markey/Extender: Kathreen Cosier in Treatment: 2 Visit Information History Since Last Visit Added or deleted any medications: No Patient Arrived: Wheel Chair Any new allergies or adverse reactions: No Arrival Time: 15:09 Had a fall or experienced change in No Accompanied By: wife activities of daily living that may affect Transfer Assistance: None risk of falls: Patient Identification Verified: Yes Signs or symptoms of abuse/neglect since last visito No Secondary Verification Process Yes Hospitalized since last visit: No Completed: Has Dressing in Place as Prescribed: Yes Patient Requires Transmission-Based No Pain Present Now: No Precautions: Patient Has Alerts: Yes Patient Alerts: Patient on Blood Thinner Warfarin Pacemaker Electronic Signature(s) Signed: 11/26/2017 10:13:52 AM By: Dayton Martes RCP, RRT, CHT Entered By: Dayton Martes on 11/25/2017 15:10:24 [name] ([id number]) -------------------------------------------------------------------------------- Encounter Discharge Information Details Patient Name: Donald Musca C. Date of Service: 11/25/2017 2:45 PM Medical Record Number: [id number] Patient Account Number: [id number] Date of Birth/Sex: 1929-04-11 (84 y.o. M) Treating RN: Huel Coventry Primary Care Darian Cansler: Guerry Bruin Other Clinician: Referring Izaiah Tabb: Guerry Bruin Treating Meklit Cotta/Extender: Kathreen Cosier in Treatment: 2 Encounter Discharge Information Items Discharge Pain Level: 0 Discharge Condition:  Stable Ambulatory Status: Wheelchair Discharge Destination: Home Transportation: Private Auto Accompanied By: wife Schedule Follow-up Appointment: Yes Medication Reconciliation completed and No provided to Patient/Care Jamond Neels: Provided on Clinical Summary of Care: 11/25/2017 Form Type Recipient Paper Patient RW Electronic Signature(s) Signed: 11/25/2017 4:45:38 PM By: Donald Crigler Entered By: Donald Crigler on 11/25/2017 16:33:38 Vandruff, [name] ([id number]) -------------------------------------------------------------------------------- Lower Extremity Assessment Details Patient Name: Donald Musca C. Date of Service: 11/25/2017 2:45 PM Medical Record Number: [id number] Patient Account Number: [id number] Date of Birth/Sex: October 19, 1928 (84 y.o. M) Treating RN: Donald Crigler Primary Care Mar Zettler: Guerry Bruin Other Clinician: Referring Rufino Staup: Guerry Bruin Treating Summerlyn Fickel/Extender: Kathreen Cosier in Treatment: 2 Edema Assessment Assessed: [Left: No] [Right: No] Edema: [Left: Yes] [Right: Yes] Calf Left: Right: Point of Measurement: 33 cm From Medial Instep 31.2 cm 31 cm Ankle Left: Right: Point of Measurement: 12 cm From Medial Instep 22 cm 21.5 cm Vascular Assessment Claudication: Claudication Assessment [Left:None] [Right:None] Pulses: Dorsalis Pedis Palpable: [Left:Yes] [Right:Yes] Posterior Tibial Extremity colors, hair growth, and conditions: Extremity Color: [Left:Hyperpigmented] [Right:Hyperpigmented] Hair Growth on Extremity: [Left:No] [Right:No] Temperature of Extremity: [Left:Warm] [Right:Warm] Capillary Refill: [Left:> 3 seconds] [Right:> 3 seconds] Toe Nail Assessment Left: Right: Thick: Yes Yes Discolored: Yes Yes Deformed: Yes Yes Improper Length and Hygiene: Yes Yes Electronic Signature(s) Signed: 11/25/2017 4:45:38 PM By: Donald Crigler Entered By: Donald Crigler on 11/25/2017 15:32:00 [name]  ([id number]) -------------------------------------------------------------------------------- Multi Wound Chart Details Patient Name: Donald Musca C. Date of Service: 11/25/2017 2:45 PM Medical Record Number: [id number] Patient Account Number: [id number] Date of Birth/Sex: 05-08-1929 (84 y.o. M) Treating RN: Donald Crigler Primary Care Demetrice Combes: Guerry Bruin Other Clinician: Referring Degan Hanser: Guerry Bruin Treating Luby Seamans/Extender: Kathreen Cosier in Treatment: 2 Vital Signs Height(in): 68 Pulse(bpm): 111 Weight(lbs): 150 Blood Pressure(mmHg): 97/50 Body Mass Index(BMI): 23 Temperature(F): 98.3 Respiratory Rate 16 (breaths/min): Photos: [5:No Photos] [6:No Photos] [7:No Photos] Wound Location: [5:Left Malleolus - Medial] [6:Right Lower Leg - Medial] [7:Right Lower Leg - Anterior] Wounding Event: [5:Blister] [6:Blister] [7:Gradually Appeared] Primary Etiology: [5:Venous Leg Ulcer] [  6:Venous Leg Ulcer] [7:Venous Leg Ulcer] Comorbid History: [5:Cataracts, Arrhythmia, Deep Vein Thrombosis, Osteoarthritis] [6:Cataracts, Arrhythmia, Deep Vein Thrombosis, Osteoarthritis] [7:Cataracts, Arrhythmia, Deep Vein Thrombosis, Osteoarthritis] Date Acquired: [5:10/28/2017] [6:10/28/2017] [7:10/28/2017] Weeks of Treatment: [5:2] [6:2] [7:2] Wound Status: [5:Open] [6:Open] [7:Open] Measurements L x W x D [5:1x1x0.1] [6:0.5x0.2x0.1] [7:0.6x0.8x0.1] (cm) Area (cm) : [5:0.785] [6:0.079] [7:0.377] Volume (cm) : [5:0.079] [6:0.008] [7:0.038] % Reduction in Area: [5:-232.60%] [6:86.60%] [7:-92.30%] % Reduction in Volume: [5:-229.20%] [6:86.40%] [7:-90.00%] Classification: [5:Full Thickness Without Exposed Support Structures] [6:Full Thickness Without Exposed Support Structures] [7:Full Thickness Without Exposed Support Structures] Exudate Amount: [5:Large] [6:Small] [7:Small] Exudate Type: [5:Serous] [6:Serosanguineous] [7:Serosanguineous] Exudate Color: [5:amber] [6:red, brown]  [7:red, brown] Wound Margin: [5:Flat and Intact] [6:Flat and Intact] [7:Flat and Intact] Granulation Amount: [5:Medium (34-66%)] [6:Large (67-100%)] [7:Large (67-100%)] Granulation Quality: [5:Red] [6:Red] [7:Pink] Necrotic Amount: [5:Medium (34-66%)] [6:Small (1-33%)] [7:None Present (0%)] Exposed Structures: [5:Fat Layer (Subcutaneous Tissue) Exposed: Yes Fascia: No Tendon: No Muscle: No Joint: No Bone: No] [6:Fat Layer (Subcutaneous Tissue) Exposed: Yes Fascia: No Tendon: No Muscle: No Joint: No Bone: No] [7:Fascia: No Fat Layer (Subcutaneous Tissue)  Exposed: No Tendon: No Muscle: No Joint: No Bone: No] Epithelialization: [5:None] [6:None] [7:None] Debridement: [5:Debridement ([id number])] [6:N/A] [7:N/A] Pre-procedure [5:15:39] [6:N/A] [7:N/A] Verification/Time Out Taken: Pain Control: [5:Other] [6:N/A] [7:N/A] Tissue Debrided: Fibrin/Slough, Skin, N/A N/A Subcutaneous Debridement Area (sq cm): 1 N/A N/A Instrument: Curette N/A N/A Bleeding: Minimum N/A N/A Hemostasis Achieved: Pressure N/A N/A Procedural Pain: 0 N/A N/A Post Procedural Pain: 0 N/A N/A Debridement Treatment Procedure was tolerated well N/A N/A Response: Post Debridement 1x1x0.1 N/A N/A Measurements L x W x D (cm) Post Debridement Volume: 0.079 N/A N/A (cm) Periwound Skin Texture: No Abnormalities Noted Excoriation: No Excoriation: No Induration: No Induration: No Callus: No Callus: No Crepitus: No Crepitus: No Rash: No Rash: No Scarring: No Scarring: No Periwound Skin Moisture: No Abnormalities Noted Maceration: No Maceration: No Dry/Scaly: No Dry/Scaly: No Periwound Skin Color: Erythema: Yes Erythema: Yes Atrophie Blanche: No Atrophie Blanche: No Cyanosis: No Cyanosis: No Ecchymosis: No Ecchymosis: No Erythema: No Hemosiderin Staining: No Hemosiderin Staining: No Mottled: No Mottled: No Pallor: No Pallor: No Rubor: No Rubor: No Erythema Location: Circumferential Circumferential  N/A Temperature: No Abnormality No Abnormality No Abnormality Tenderness on Palpation: Yes Yes Yes Wound Preparation: Ulcer Cleansing: Ulcer Cleansing: Ulcer Cleansing: Rinsed/Irrigated with Saline Rinsed/Irrigated with Saline Rinsed/Irrigated with Saline Topical Anesthetic Applied: Topical Anesthetic Applied: Topical Anesthetic Applied: None Other: lidocaine 4% Other: lidocaine 4% Procedures Performed: Debridement N/A N/A Wound Number: [id number] N/A Photos: No Photos No Photos N/A Wound Location: Right, Lateral Lower Leg Left, Medial Lower Leg N/A Wounding Event: Blister Gradually Appeared N/A Primary Etiology: Venous Leg Ulcer Venous Leg Ulcer N/A Comorbid History: N/A N/A N/A Date Acquired: 10/28/2017 11/18/2017 N/A Weeks of Treatment: 2 1 N/A Wound Status: Open Healed - Epithelialized N/A Measurements L x W x D 0.5x0.6x0.1 0x0x0 N/A (cm) Area (cm) : 0.236 0 N/A Volume (cm) : 0.024 0 N/A % Reduction in Area: -232.40% 100.00% N/A % Reduction in Volume: -242.90% 100.00% N/A Classification: N/A HARTWELL, VANDIVER C. ([id number]) Full Thickness Without Full Thickness Without Exposed Support Structures Exposed Support Structures Exudate Amount: N/A N/A N/A Exudate Type: N/A N/A N/A Exudate Color: N/A N/A N/A Wound Margin: N/A N/A N/A Granulation Amount: N/A N/A N/A Granulation Quality: N/A N/A N/A Necrotic Amount: N/A N/A N/A Exposed Structures: N/A N/A N/A Epithelialization: N/A N/A N/A Debridement: N/A N/A N/A Pain Control: N/A N/A N/A Tissue Debrided: N/A N/A N/A  Debridement Area (sq cm): N/A N/A N/A Instrument: N/A N/A N/A Bleeding: N/A N/A N/A Hemostasis Achieved: N/A N/A N/A Procedural Pain: N/A N/A N/A Post Procedural Pain: N/A N/A N/A Debridement Treatment N/A N/A N/A Response: Post Debridement N/A N/A N/A Measurements L x W x D (cm) Post Debridement Volume: N/A N/A N/A (cm) Periwound Skin Texture: No Abnormalities Noted No Abnormalities Noted N/A Periwound  Skin Moisture: No Abnormalities Noted No Abnormalities Noted N/A Periwound Skin Color: No Abnormalities Noted No Abnormalities Noted N/A Erythema Location: N/A N/A N/A Temperature: N/A N/A N/A Tenderness on Palpation: No No N/A Wound Preparation: N/A N/A N/A Procedures Performed: N/A N/A N/A Treatment Notes Electronic Signature(s) Signed: 11/25/2017 3:50:41 PM By: Bonnell Public Entered By: Bonnell Public on 11/25/2017 15:50:41 [name] ([id number]) -------------------------------------------------------------------------------- Multi-Disciplinary Care Plan Details Patient Name: [name]. Date of Service: 11/25/2017 2:45 PM Medical Record Number: [id number] Patient Account Number: [id number] Date of Birth/Sex: 07-26-1929 (84 y.o. M) Treating RN: Donald Crigler Primary Care Tatianna Ibbotson: Guerry Bruin Other Clinician: Referring Tika Hannis: Guerry Bruin Treating Ayannah Faddis/Extender: Kathreen Cosier in Treatment: 2 Active Inactive ` Abuse / Safety / Falls / Self Care Management Nursing Diagnoses: Impaired physical mobility Potential for falls Goals: Patient will not develop complications from immobility Date Initiated: 11/11/2017 Target Resolution Date: 12/09/2017 Goal Status: Active Patient will remain injury free related to falls Date Initiated: 11/11/2017 Target Resolution Date: 12/09/2017 Goal Status: Active Interventions: Assess fall risk on admission and as needed Notes: ` Nutrition Nursing Diagnoses: Potential for alteratiion in Nutrition/Potential for imbalanced nutrition Goals: Patient/caregiver agrees to and verbalizes understanding of need to use nutritional supplements and/or vitamins as prescribed Date Initiated: 11/11/2017 Target Resolution Date: 12/10/2017 Goal Status: Active Interventions: Assess patient nutrition upon admission and as needed per policy Notes: ` Orientation to the Wound Care Program Nursing Diagnoses: Knowledge deficit  related to the wound healing center program Goals: Patient/caregiver will verbalize understanding of the Wound Healing Center [address] SHEEHAN, STACEY ([id number]) Date Initiated: 11/11/2017 Target Resolution Date: 12/10/2017 Goal Status: Active Interventions: Provide education on orientation to the wound center Notes: ` Venous Leg Ulcer Nursing Diagnoses: Actual venous Insuffiency (use after diagnosis is confirmed) Goals: Non-invasive venous studies are completed as ordered Date Initiated: 11/11/2017 Target Resolution Date: 12/10/2017 Goal Status: Active Interventions: Assess peripheral edema status every visit. Treatment Activities: Non-invasive vascular studies : 11/11/2017 Notes: ` Wound/Skin Impairment Nursing Diagnoses: Impaired tissue integrity Goals: Ulcer/skin breakdown will have a volume reduction of 80% by week 12 Date Initiated: 11/11/2017 Target Resolution Date: 12/10/2017 Goal Status: Active Interventions: Assess patient/caregiver ability to obtain necessary supplies Provide education on ulcer and skin care Treatment Activities: Topical wound management initiated : 11/11/2017 Notes: Electronic Signature(s) Signed: 11/25/2017 4:45:38 PM By: Donald Crigler Entered By: Donald Crigler on 11/25/2017 15:38:51 Weldon, [name] ([id number]) -------------------------------------------------------------------------------- Pain Assessment Details Patient Name: Donald Musca C. Date of Service: 11/25/2017 2:45 PM Medical Record Number: [id number] Patient Account Number: [id number] Date of Birth/Sex: 10/02/1928 (84 y.o. M) Treating RN: Huel Coventry Primary Care Patsey Pitstick: Guerry Bruin Other Clinician: Referring Jimy Gates: Guerry Bruin Treating Cyara Devoto/Extender: Kathreen Cosier in Treatment: 2 Active Problems Location of Pain Severity and Description of Pain Patient Has Paino No Site Locations Pain Management and Medication Current Pain  Management: Electronic Signature(s) Signed: 11/26/2017 10:13:52 AM By: Dayton Martes RCP, RRT, CHT Signed: 11/27/2017 4:43:23 PM By: Elliot Gurney, [name], RN, CWS, [name], [name] Entered By: Dayton Martes on 11/25/2017 15:10:35 [name] ([id number]) -------------------------------------------------------------------------------- Patient/Caregiver Education Details Patient  Name: Nusz, Jamichael C. Date of Service: 11/25/2017 2:45 PM Medical Record Number: [id number] Patient Account Number: [id number] Date of Birth/Gender: 07/01/1929 (84 y.o. M) Treating RN: Donald CriglerFlinchum, Cheryl Primary Care Physician: Guerry BruinISOVEC, RICHARD Other Clinician: Referring Physician: Guerry BruinISOVEC, RICHARD Treating Physician/Extender: Kathreen Cosieroulter, Leah Weeks in Treatment: 2 Education Assessment Education Provided To: Patient Education Topics Provided Wound Debridement: Handouts: Wound Debridement Methods: Explain/Verbal Responses: State content correctly Wound/Skin Impairment: Handouts: Caring for Your Ulcer Methods: Explain/Verbal Responses: State content correctly Electronic Signature(s) Signed: 11/25/2017 4:45:38 PM By: Donald CriglerFlinchum, Cheryl Entered By: Donald CriglerFlinchum, Cheryl on 11/25/2017 16:33:53 Mortensen, [name] ([id number]) -------------------------------------------------------------------------------- Wound Assessment Details Patient Name: Donald MuscaWILLIFORD, Kwamaine C. Date of Service: 11/25/2017 2:45 PM Medical Record Number: [id number] Patient Account Number: [id number] Date of Birth/Sex: 06/02/1929 (84 y.o. M) Treating RN: Donald CriglerFlinchum, Cheryl Primary Care Jaleeyah Munce: Guerry BruinISOVEC, RICHARD Other Clinician: Referring Tauna Macfarlane: Guerry BruinISOVEC, RICHARD Treating Berthold Glace/Extender: Bonnell Publicoulter, Leah Weeks in Treatment: 2 Wound Status Wound Number: 5 Primary Venous Leg Ulcer Etiology: Wound Location: Left Malleolus - Medial Wound Status: Open Wounding Event: Blister Comorbid Cataracts, Arrhythmia, Deep Vein Date Acquired:  10/28/2017 History: Thrombosis, Osteoarthritis Weeks Of Treatment: 2 Clustered Wound: No Photos Photo Uploaded By: Donald CriglerFlinchum, Cheryl on 11/25/2017 17:00:16 Wound Measurements Length: (cm) 1 Width: (cm) 1 Depth: (cm) 0.1 Area: (cm) 0.785 Volume: (cm) 0.079 % Reduction in Area: -232.6% % Reduction in Volume: -229.2% Epithelialization: None Tunneling: No Undermining: No Wound Description Full Thickness Without Exposed Support Classification: Structures Wound Margin: Flat and Intact Exudate Large Amount: Exudate Type: Serous Exudate Color: amber Foul Odor After Cleansing: No Slough/Fibrino Yes Wound Bed Granulation Amount: Medium (34-66%) Exposed Structure Granulation Quality: Red Fascia Exposed: No Necrotic Amount: Medium (34-66%) Fat Layer (Subcutaneous Tissue) Exposed: Yes Necrotic Quality: Adherent Slough Tendon Exposed: No Muscle Exposed: No Joint Exposed: No Bone Exposed: No Arechiga, Kraig C. ([id number]) Periwound Skin Texture Texture Color No Abnormalities Noted: No No Abnormalities Noted: No Erythema: Yes Moisture Erythema Location: Circumferential No Abnormalities Noted: No Temperature / Pain Temperature: No Abnormality Tenderness on Palpation: Yes Wound Preparation Ulcer Cleansing: Rinsed/Irrigated with Saline Topical Anesthetic Applied: None Treatment Notes Wound #5 (Left, Medial Malleolus) 1. Cleansed with: Clean wound with Normal Saline 2. Anesthetic Topical Lidocaine 4% cream to wound bed prior to debridement 4. Dressing Applied: Other dressing (specify in notes) 5. Secondary Dressing Applied ABD Pad 7. Secured with 3 Layer Compression System - Bilateral Notes silvercell Electronic Signature(s) Signed: 11/25/2017 4:45:38 PM By: Donald CriglerFlinchum, Cheryl Entered By: Donald CriglerFlinchum, Cheryl on 11/25/2017 15:32:51 Georges MouseWILLIFORD, Dedric C. ([id number]) -------------------------------------------------------------------------------- Wound Assessment  Details Patient Name: Donald MuscaWILLIFORD, Lashan C. Date of Service: 11/25/2017 2:45 PM Medical Record Number: [id number] Patient Account Number: [id number] Date of Birth/Sex: 01/19/1929 (84 y.o. M) Treating RN: Donald CriglerFlinchum, Cheryl Primary Care Bates Collington: Guerry BruinISOVEC, RICHARD Other Clinician: Referring Neidra Girvan: Guerry BruinISOVEC, RICHARD Treating Kahealani Yankovich/Extender: Bonnell Publicoulter, Leah Weeks in Treatment: 2 Wound Status Wound Number: 6 Primary Venous Leg Ulcer Etiology: Wound Location: Right Lower Leg - Medial Wound Status: Open Wounding Event: Blister Comorbid Cataracts, Arrhythmia, Deep Vein Date Acquired: 10/28/2017 History: Thrombosis, Osteoarthritis Weeks Of Treatment: 2 Clustered Wound: No Photos Photo Uploaded By: Donald CriglerFlinchum, Cheryl on 11/25/2017 17:00:59 Wound Measurements Length: (cm) 0.5 Width: (cm) 0.2 Depth: (cm) 0.1 Area: (cm) 0.079 Volume: (cm) 0.008 % Reduction in Area: 86.6% % Reduction in Volume: 86.4% Epithelialization: None Tunneling: No Undermining: No Wound Description Full Thickness Without Exposed Support Classification: Structures Wound Margin: Flat and Intact Exudate Small Amount: Exudate Type: Serosanguineous Exudate Color: red, brown Foul Odor After Cleansing: No Slough/Fibrino No Wound Bed Granulation Amount: Large (  67-100%) Exposed Structure Granulation Quality: Red Fascia Exposed: No Necrotic Amount: Small (1-33%) Fat Layer (Subcutaneous Tissue) Exposed: Yes Necrotic Quality: Adherent Slough Tendon Exposed: No Muscle Exposed: No Joint Exposed: No Bone Exposed: No Dorko, Reza C. ([id number]) Periwound Skin Texture Texture Color No Abnormalities Noted: No No Abnormalities Noted: No Callus: No Atrophie Blanche: No Crepitus: No Cyanosis: No Excoriation: No Ecchymosis: No Induration: No Erythema: Yes Rash: No Erythema Location: Circumferential Scarring: No Hemosiderin Staining: No Mottled: No Moisture Pallor: No No Abnormalities Noted: No Rubor:  No Dry / Scaly: No Maceration: No Temperature / Pain Temperature: No Abnormality Tenderness on Palpation: Yes Wound Preparation Ulcer Cleansing: Rinsed/Irrigated with Saline Topical Anesthetic Applied: Other: lidocaine 4%, Treatment Notes Wound #6 (Right, Medial Lower Leg) 1. Cleansed with: Clean wound with Normal Saline 2. Anesthetic Topical Lidocaine 4% cream to wound bed prior to debridement 4. Dressing Applied: Other dressing (specify in notes) 5. Secondary Dressing Applied Foam 7. Secured with 3 Layer Compression System - Bilateral Electronic Signature(s) Signed: 11/25/2017 4:45:38 PM By: Donald Crigler Entered By: Donald Crigler on 11/25/2017 15:33:32 [name] ([id number]) -------------------------------------------------------------------------------- Wound Assessment Details Patient Name: Donald Musca C. Date of Service: 11/25/2017 2:45 PM Medical Record Number: [id number] Patient Account Number: [id number] Date of Birth/Sex: 1928-12-21 (84 y.o. M) Treating RN: Donald Crigler Primary Care Lakevia Perris: Guerry Bruin Other Clinician: Referring Michael Ventresca: Guerry Bruin Treating Cheryllynn Sarff/Extender: Kathreen Cosier in Treatment: 2 Wound Status Wound Number: 7 Primary Venous Leg Ulcer Etiology: Wound Location: Right Lower Leg - Anterior Wound Status: Open Wounding Event: Gradually Appeared Comorbid Cataracts, Arrhythmia, Deep Vein Date Acquired: 10/28/2017 History: Thrombosis, Osteoarthritis Weeks Of Treatment: 2 Clustered Wound: No Photos Photo Uploaded By: Donald Crigler on 11/25/2017 17:00:17 Wound Measurements Length: (cm) 0.6 Width: (cm) 0.8 Depth: (cm) 0.1 Area: (cm) 0.377 Volume: (cm) 0.038 % Reduction in Area: -92.3% % Reduction in Volume: -90% Epithelialization: None Tunneling: No Undermining: No Wound Description Full Thickness Without Exposed Support Classification: Structures Wound Margin: Flat and  Intact Exudate Small Amount: Exudate Type: Serosanguineous Exudate Color: red, brown Foul Odor After Cleansing: No Slough/Fibrino No Wound Bed Granulation Amount: Large (67-100%) Exposed Structure Granulation Quality: Pink Fascia Exposed: No Necrotic Amount: None Present (0%) Fat Layer (Subcutaneous Tissue) Exposed: No Tendon Exposed: No Muscle Exposed: No Joint Exposed: No Bone Exposed: No Donahoe, Garnie C. ([id number]) Periwound Skin Texture Texture Color No Abnormalities Noted: No No Abnormalities Noted: No Callus: No Atrophie Blanche: No Crepitus: No Cyanosis: No Excoriation: No Ecchymosis: No Induration: No Erythema: No Rash: No Hemosiderin Staining: No Scarring: No Mottled: No Pallor: No Moisture Rubor: No No Abnormalities Noted: No Dry / Scaly: No Temperature / Pain Maceration: No Temperature: No Abnormality Tenderness on Palpation: Yes Wound Preparation Ulcer Cleansing: Rinsed/Irrigated with Saline Topical Anesthetic Applied: Other: lidocaine 4%, Treatment Notes Wound #7 (Right, Anterior Lower Leg) 1. Cleansed with: Clean wound with Normal Saline 2. Anesthetic Topical Lidocaine 4% cream to wound bed prior to debridement 4. Dressing Applied: Other dressing (specify in notes) 7. Secured with 3 Layer Compression System - Bilateral Notes silvercel, drawtex Electronic Signature(s) Signed: 11/25/2017 4:45:38 PM By: Donald Crigler Entered By: Donald Crigler on 11/25/2017 15:34:18 [name] ([id number]) -------------------------------------------------------------------------------- Wound Assessment Details Patient Name: Donald Musca C. Date of Service: 11/25/2017 2:45 PM Medical Record Number: [id number] Patient Account Number: [id number] Date of Birth/Sex: [birth year] (84 y.o. M) Treating RN: Donald Crigler Primary Care Hatcher Froning: Guerry Bruin Other Clinician: Referring Benedicto Capozzi: Guerry Bruin Treating Alysen Smylie/Extender:  Bonnell Public Weeks in Treatment: 2 Wound  Status Wound Number: 8 Primary Etiology: Venous Leg Ulcer Wound Location: Right, Lateral Lower Leg Wound Status: Open Wounding Event: Blister Date Acquired: 10/28/2017 Weeks Of Treatment: 2 Clustered Wound: No Photos Photo Uploaded By: Donald Crigler on 11/25/2017 17:01:00 Wound Measurements Length: (cm) 0.5 Width: (cm) 0.6 Depth: (cm) 0.1 Area: (cm) 0.236 Volume: (cm) 0.024 % Reduction in Area: -232.4% % Reduction in Volume: -242.9% Wound Description Full Thickness Without Exposed Support Classification: Structures Periwound Skin Texture Texture Color No Abnormalities Noted: No No Abnormalities Noted: No Moisture No Abnormalities Noted: No Treatment Notes Wound #8 (Right, Lateral Lower Leg) 1. Cleansed with: Clean wound with Normal Saline 2. Anesthetic Topical Lidocaine 4% cream to wound bed prior to debridement Moodie, Avelino C. ([id number]) 4. Dressing Applied: Other dressing (specify in notes) 5. Secondary Dressing Applied ABD Pad 7. Secured with 3 Layer Compression System - Bilateral Notes silvercell Electronic Signature(s) Signed: 11/25/2017 4:45:38 PM By: Donald Crigler Entered By: Donald Crigler on 11/25/2017 15:27:51 [name] ([id number]) -------------------------------------------------------------------------------- Wound Assessment Details Patient Name: Donald Musca C. Date of Service: 11/25/2017 2:45 PM Medical Record Number: [id number] Patient Account Number: [id number] Date of Birth/Sex: [birth year] (84 y.o. M) Treating RN: Donald Crigler Primary Care Riverlyn Kizziah: Guerry Bruin Other Clinician: Referring Rayon Mcchristian: Guerry Bruin Treating Kamori Barbier/Extender: Kathreen Cosier in Treatment: 2 Wound Status Wound Number: [id number] Primary Etiology: Venous Leg Ulcer Wound Location: Left, Medial Lower Leg Wound Status: Healed - Epithelialized Wounding Event: Gradually Appeared Date  Acquired: 11/18/2017 Weeks Of Treatment: 1 Clustered Wound: No Photos Photo Uploaded By: Donald Crigler on 11/25/2017 17:01:18 Wound Measurements Length: (cm) 0 Width: (cm) 0 Depth: (cm) 0 Area: (cm) 0 Volume: (cm) 0 % Reduction in Area: 100% % Reduction in Volume: 100% Wound Description Full Thickness Without Exposed Support Classification: Structures Periwound Skin Texture Texture Color No Abnormalities Noted: No No Abnormalities Noted: No Moisture No Abnormalities Noted: No Electronic Signature(s) Signed: 11/25/2017 4:45:38 PM By: Donald Crigler Entered By: Donald Crigler on 11/25/2017 15:29:38 [name] ([id number]) -------------------------------------------------------------------------------- Vitals Details Patient Name: Donald Musca C. Date of Service: 11/25/2017 2:45 PM Medical Record Number: [id number] Patient Account Number: [id number] Date of Birth/Sex: October 25, 1928 (84 y.o. M) Treating RN: Huel Coventry Primary Care Dominico Rod: Guerry Bruin Other Clinician: Referring Josecarlos Harriott: Guerry Bruin Treating Deshanta Lady/Extender: Kathreen Cosier in Treatment: 2 Vital Signs Time Taken: 15:05 Temperature (F): 98.3 Height (in): 68 Pulse (bpm): 111 Weight (lbs): 150 Respiratory Rate (breaths/min): 16 Body Mass Index (BMI): 22.8 Blood Pressure (mmHg): 97/50 Reference Range: 80 - 120 mg / dl Electronic Signature(s) Signed: 11/26/2017 10:13:52 AM By: Dayton Martes RCP, RRT, CHT Entered By: Dayton Martes on 11/25/2017 15:11:13

## 2017-11-29 NOTE — Progress Notes (Signed)
[name], [name] ([id number]) Visit Report for 11/25/2017 Chief Complaint Document Details Patient Name: [name], [name]. Date of Service: 11/25/2017 2:45 PM Medical Record Number: [id number] Patient Account Number: [id number] Date of Birth/Sex: [birth year] (84 y.o. M) Treating RN: Huel Coventry Primary Care Provider: Guerry Bruin Other Clinician: Referring Provider: Guerry Bruin Treating Provider/Extender: Kathreen Cosier in Treatment: 2 Information Obtained from: Patient Chief Complaint He is here for follow up evaluation for BLE ulcerations Electronic Signature(s) Signed: 11/25/2017 3:51:09 PM By: Bonnell Public Entered By: Bonnell Public on 11/25/2017 15:51:09 [name] ([id number]) -------------------------------------------------------------------------------- Debridement Details Patient Name: Donald Musca C. Date of Service: 11/25/2017 2:45 PM Medical Record Number: [id number] Patient Account Number: [id number] Date of Birth/Sex: Jul 10, 1929 (84 y.o. M) Treating RN: Huel Coventry Primary Care Provider: Guerry Bruin Other Clinician: Referring Provider: Guerry Bruin Treating Provider/Extender: Kathreen Cosier in Treatment: 2 Debridement Performed for Wound #5 Left,Medial Malleolus Assessment: Performed By: Physician Bonnell Public, NP Debridement Type: Debridement Severity of Tissue Pre Fat layer exposed Debridement: Pre-procedure Verification/Time Yes - 15:39 Out Taken: Start Time: 15:39 Pain Control: Other : lidocaine 4% Total Area Debrided (L x W): 1 (cm) x 1 (cm) = 1 (cm) Tissue and other material Viable, Non-Viable, Fibrin/Slough, Skin, Subcutaneous debrided: Instrument: Curette Bleeding: Minimum Hemostasis Achieved: Pressure End Time: 15:40 Procedural Pain: 0 Post Procedural Pain: 0 Response to Treatment: Procedure was tolerated well Post Debridement Measurements of Total Wound Length: (cm) 1 Width: (cm) 1 Depth: (cm)  0.1 Volume: (cm) 0.079 Character of Wound/Ulcer Post Debridement: Stable Severity of Tissue Post Debridement: Fat layer exposed Post Procedure Diagnosis Same as Pre-procedure Electronic Signature(s) Signed: 11/25/2017 3:54:31 PM By: Bonnell Public Signed: 11/27/2017 4:43:23 PM By: Elliot Gurney, [name], RN, CWS, [name], [name] Entered By: Bonnell Public on 11/25/2017 15:54:31 [name] ([id number]) -------------------------------------------------------------------------------- HPI Details Patient Name: Donald Musca C. Date of Service: 11/25/2017 2:45 PM Medical Record Number: [id number] Patient Account Number: [id number] Date of Birth/Sex: 04-28-1929 (84 y.o. M) Treating RN: Huel Coventry Primary Care Provider: Guerry Bruin Other Clinician: Referring Provider: Guerry Bruin Treating Provider/Extender: Kathreen Cosier in Treatment: 2 History of Present Illness HPI Description: Pleasant 84 year old with history of chronic venous insufficiency, remote history of DVT, atrial fibrillation (on Coumadin). No history of diabetes. He says that he developed worsening left lower extremity swelling, blisters, and subsequent ulcerations in late October 2016. He was started on Augmentin for left lower extremity cellulitis, which has improved. Arterial ultrasound 07/27/2015 showed no significant peripheral arterial disease. Awaiting venous ultrasound. Performing dressing changes with silver alginate and using a Tubigrip for edema control. He denies any significant pain. No claudication or rest pain. Ambulating per his baseline with a walker. No fever or chills. Moderate drainage. 10/10/15; the patient arrives back today having not been seen in almost 2 months. He has no open wounds on his legs. He has a history of venous insufficiency, arterial insufficiency. He has chronic Coumadin use secondary to atrial fibrillation and a remote history of DVT. Fortunately as he has not been here in 2 months we  were not able to order him stockings. He is not able to get on standard compression hose. Juxtalite stockings would cost him probably about $100 appear, the couple was not able to afford this. 11/11/17 READMISSION This is an 84 year old man who is not a diabetic. He does have a history of sick sinus syndrome atrial fibrillation and has a cardiac pacemaker and is on Coumadin.he also has a history of DVT. His wife states that  episodically over the last 2 years he has had wounds on his lower extremities but they have healed with home measures. Roughly 2-3 weeks ago the legs became a lot more swollen and he has had draining areas on the right anterior medial and lateral and the left medial leg. The patient's wife states that they were told to put support stockings on him but they are concerned because they worsen the blisters. this bit difficult to get the history but they think the edema has been increasing over the last several months. 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 He does not complain of chest pain orthopnea. He does not have a history of kidney issues that he is aware. Primary doctor at [facility]. He is not on diuretics. He was here 2 years ago. He was noted to have edema in his legs at that time. He had an ultrasound of his legs which I'll need to review. There was discussion about compression stockings but he is complaining clearly not using them. ABI in the right in this clinic was 1.37, 1.07 on the left 11/18/17; this is an elderly frail man who has bilateral small lower extremity wounds in the setting of very significant multifactorial edema. He probably has chronic venous insufficiency with lymphedema but it is clearly fluid overloaded last week. He apparently has started on diuretics through his primary physician at [facility] but the patient's wife is not sure which one but states that he goes to the bathroom every 15  minutes. He is tolerating compression wraps. His wife stated that they charged him full price stating that he didn't have open wounds on his legs that would cover wraparound stockings. His edema is much better controlled 11/25/17-he is here in follow-up evaluation for bilateral lower extremity ulcers. He is tolerating compression therapy. There was one episode since last appointment with copious amounts of drainage from the right medial malleolus. We will add additional absorptive dressing, otherwise no change in treatment plan and he will follow-up next week Electronic Signature(s) Signed: 11/25/2017 3:51:57 PM By: Bonnell Public Entered By: Bonnell Public on 11/25/2017 15:51:57 [name] ([id number]) -------------------------------------------------------------------------------- Physician Orders Details Patient Name: Donald Musca C. Date of Service: 11/25/2017 2:45 PM Medical Record Number: [id number] Patient Account Number: [id number] Date of Birth/Sex: 1928/11/15 (84 y.o. M) Treating RN: Donald Crigler Primary Care Provider: Guerry Bruin Other Clinician: Referring Provider: Guerry Bruin Treating Provider/Extender: Kathreen Cosier in Treatment: 2 Verbal / Phone Orders: No Diagnosis Coding Wound Cleansing Wound #5 Left,Medial Malleolus o Cleanse wound with mild soap and water Wound #6 Right,Medial Lower Leg o Cleanse wound with mild soap and water Wound #7 Right,Anterior Lower Leg o Cleanse wound with mild soap and water Wound #8 Right,Lateral Lower Leg o Cleanse wound with mild soap and water Anesthetic (add to Medication List) Wound #5 Left,Medial Malleolus o Topical Lidocaine 4% cream applied to wound bed prior to debridement (In Clinic Only). Skin Barriers/Peri-Wound Care Wound #5 Left,Medial Malleolus o Moisturizing lotion o Other: - OTC Lotrimin Cream on top of right foot. Wound #6 Right,Medial Lower Leg o Moisturizing lotion o  Other: - OTC Lotrimin Cream on top of right foot. Wound #7 Right,Anterior Lower Leg o Moisturizing lotion o Other: - OTC Lotrimin Cream on top of right foot. Wound #8 Right,Lateral Lower Leg o Moisturizing lotion o Other: - OTC Lotrimin Cream on top of right foot. Primary Wound Dressing Wound #5 Left,Medial Malleolus o Silvercel  Non-Adherent Wound #6 Right,Medial Lower Leg o Silvercel Non-Adherent o Foam Wound #7 Right,Anterior Lower Leg o Silvercel Non-Adherent Stickle, Placido C. ([id number]) o Drawtex Wound #8 Right,Lateral Lower Leg o Silvercel Non-Adherent Secondary Dressing Wound #5 Left,Medial Malleolus o ABD pad Wound #6 Right,Medial Lower Leg o ABD pad Wound #7 Right,Anterior Lower Leg o ABD pad Wound #8 Right,Lateral Lower Leg o ABD pad Dressing Change Frequency Wound #5 Left,Medial Malleolus o Change Dressing Monday, Wednesday, Friday Wound #6 Right,Medial Lower Leg o Change Dressing Monday, Wednesday, Friday Wound #7 Right,Anterior Lower Leg o Change Dressing Monday, Wednesday, Friday Wound #8 Right,Lateral Lower Leg o Change Dressing Monday, Wednesday, Friday Follow-up Appointments Wound #5 Left,Medial Malleolus o Return Appointment in 1 week. Wound #6 Right,Medial Lower Leg o Return Appointment in 1 week. Wound #7 Right,Anterior Lower Leg o Return Appointment in 1 week. Wound #8 Right,Lateral Lower Leg o Return Appointment in 1 week. Edema Control Wound #5 Left,Medial Malleolus o 3 Layer Compression System - Bilateral o Elevate legs to the level of the heart and pump ankles as often as possible Wound #6 Right,Medial Lower Leg o 3 Layer Compression System - Bilateral o Elevate legs to the level of the heart and pump ankles as often as possible Wound #7 Right,Anterior Lower Leg o 3 Layer Compression System - Bilateral o Elevate legs to the level of the heart and pump ankles as often as  possible Cairns, Bernhardt C. ([id number]) Wound #8 Right,Lateral Lower Leg o 3 Layer Compression System - Bilateral o Elevate legs to the level of the heart and pump ankles as often as possible Additional Orders / Instructions Wound #5 Left,Medial Malleolus o Vitamin A; Vitamin C, Zinc o Activity as tolerated Wound #6 Right,Medial Lower Leg o Vitamin A; Vitamin C, Zinc o Activity as tolerated Wound #7 Right,Anterior Lower Leg o Vitamin A; Vitamin C, Zinc o Activity as tolerated Wound #8 Right,Lateral Lower Leg o Vitamin A; Vitamin C, Zinc o Activity as tolerated Home Health Wound #5 Left,Medial Malleolus o Initiate Home Health for Skilled Nursing o Home Health Nurse may visit PRN to address patientos wound care needs. o FACE TO FACE ENCOUNTER: MEDICARE and MEDICAID PATIENTS: I certify that this patient is under my care and that I had a face-to-face encounter that meets the physician face-to-face encounter requirements with this patient on this date. The encounter with the patient was in whole or in part for the following MEDICAL CONDITION: (primary reason for Home Healthcare) MEDICAL NECESSITY: I certify, that based on my findings, NURSING services are a medically necessary home health service. HOME BOUND STATUS: I certify that my clinical findings support that this patient is homebound (i.e., Due to illness or injury, pt requires aid of supportive devices such as crutches, cane, wheelchairs, walkers, the use of special transportation or the assistance of another person to leave their place of residence. There is a normal inability to leave the home and doing so requires considerable and taxing effort. Other absences are for medical reasons / religious services and are infrequent or of short duration when for other reasons). o If current dressing causes regression in wound condition, may D/C ordered dressing product/s and apply Normal Saline Moist Dressing  daily until next Wound Healing Center / Other MD appointment. Notify Wound Healing Center of regression in wound condition at [phone]. o Please direct any NON-WOUND related issues/requests for orders to patient's Primary Care Physician Wound #6 Right,Medial Lower Leg o Initiate Home Health for Skilled Nursing o Home Health Nurse  may visit PRN to address patientos wound care needs. o FACE TO FACE ENCOUNTER: MEDICARE and MEDICAID PATIENTS: I certify that this patient is under my care and that I had a face-to-face encounter that meets the physician face-to-face encounter requirements with this patient on this date. The encounter with the patient was in whole or in part for the following MEDICAL CONDITION: (primary reason for Home Healthcare) MEDICAL NECESSITY: I certify, that based on my findings, NURSING services are a medically necessary home health service. HOME BOUND STATUS: I certify that my clinical findings support that this patient is homebound (i.e., Due to illness or injury, pt requires aid of supportive devices such as crutches, cane, wheelchairs, walkers, the use of special transportation or the assistance of another person to leave their place of residence. There is a normal inability to leave the home and doing so requires considerable and taxing effort. Other absences are for medical reasons / religious services and are infrequent or of short duration when for other reasons). o If current dressing causes regression in wound condition, may D/C ordered dressing product/s and apply Normal Saline Moist Dressing daily until next Wound Healing Center / Other MD appointment. Notify Wound Healing Center of regression in wound condition at [phone]. LEWIE, DEMAN ([id number]) o Please direct any NON-WOUND related issues/requests for orders to patient's Primary Care Physician Wound #7 Right,Anterior Lower Leg o Initiate Home Health for Skilled Nursing o Home  Health Nurse may visit PRN to address patientos wound care needs. o FACE TO FACE ENCOUNTER: MEDICARE and MEDICAID PATIENTS: I certify that this patient is under my care and that I had a face-to-face encounter that meets the physician face-to-face encounter requirements with this patient on this date. The encounter with the patient was in whole or in part for the following MEDICAL CONDITION: (primary reason for Home Healthcare) MEDICAL NECESSITY: I certify, that based on my findings, NURSING services are a medically necessary home health service. HOME BOUND STATUS: I certify that my clinical findings support that this patient is homebound (i.e., Due to illness or injury, pt requires aid of supportive devices such as crutches, cane, wheelchairs, walkers, the use of special transportation or the assistance of another person to leave their place of residence. There is a normal inability to leave the home and doing so requires considerable and taxing effort. Other absences are for medical reasons / religious services and are infrequent or of short duration when for other reasons). o If current dressing causes regression in wound condition, may D/C ordered dressing product/s and apply Normal Saline Moist Dressing daily until next Wound Healing Center / Other MD appointment. Notify Wound Healing Center of regression in wound condition at [phone]. o Please direct any NON-WOUND related issues/requests for orders to patient's Primary Care Physician Wound #8 Right,Lateral Lower Leg o Initiate Home Health for Skilled Nursing o Home Health Nurse may visit PRN to address patientos wound care needs. o FACE TO FACE ENCOUNTER: MEDICARE and MEDICAID PATIENTS: I certify that this patient is under my care and that I had a face-to-face encounter that meets the physician face-to-face encounter requirements with this patient on this date. The encounter with the patient was in whole or in part for the  following MEDICAL CONDITION: (primary reason for Home Healthcare) MEDICAL NECESSITY: I certify, that based on my findings, NURSING services are a medically necessary home health service. HOME BOUND STATUS: I certify that my clinical findings support that this patient is homebound (i.e., Due to illness or  injury, pt requires aid of supportive devices such as crutches, cane, wheelchairs, walkers, the use of special transportation or the assistance of another person to leave their place of residence. There is a normal inability to leave the home and doing so requires considerable and taxing effort. Other absences are for medical reasons / religious services and are infrequent or of short duration when for other reasons). o If current dressing causes regression in wound condition, may D/C ordered dressing product/s and apply Normal Saline Moist Dressing daily until next Wound Healing Center / Other MD appointment. Notify Wound Healing Center of regression in wound condition at [phone]. o Please direct any NON-WOUND related issues/requests for orders to patient's Primary Care Physician Electronic Signature(s) Signed: 11/25/2017 4:45:38 PM By: Donald Crigler Signed: 11/25/2017 5:31:19 PM By: Bonnell Public Entered By: Donald Crigler on 11/25/2017 16:01:00 [name] ([id number]) -------------------------------------------------------------------------------- Problem List Details Patient Name: Donald Musca C. Date of Service: 11/25/2017 2:45 PM Medical Record Number: [id number] Patient Account Number: [id number] Date of Birth/Sex: 08-May-1929 (84 y.o. 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 Date of Service: 11/25/2017 2:45 PM Medical Record Number: [id number] Patient Account Number: [id number] Date of Birth/Sex: 07-12-1929 (84 y.o. M) Treating RN: Huel Coventry Primary Care Provider: Guerry Bruin Other Clinician: Referring Provider: Guerry Bruin Treating Provider/Extender: Kathreen Cosier in Treatment: 2 Subjective Chief Complaint Information obtained from Patient He is here for follow up evaluation for BLE ulcerations History of Present Illness (HPI) Pleasant 84 year old with history of chronic venous insufficiency, remote history of DVT, atrial fibrillation (on Coumadin). No history of diabetes. He says that he developed worsening left lower extremity swelling, blisters, and subsequent ulcerations in late October 2016. He was started on Augmentin for left lower extremity cellulitis, which has improved. Arterial ultrasound 07/27/2015 showed no significant peripheral arterial disease. Awaiting venous ultrasound. Performing dressing changes with silver alginate and using a Tubigrip for edema control. He denies any significant pain. No claudication or rest pain. Ambulating per his baseline with a walker. No fever or chills. Moderate drainage. 10/10/15; the patient arrives back today having not been seen in almost 2 months. He has no open wounds on his legs. He has a history of venous  insufficiency, arterial insufficiency. He has chronic Coumadin use secondary to atrial fibrillation and a remote history of DVT. Fortunately as he has not been here in 2 months we were not able to order him stockings. He is not able to get on standard compression hose. Juxtalite stockings would cost him probably about $100 appear, the couple was not able to afford this. 11/11/17 READMISSION This is an 84 year old man who is not a diabetic. He does have a history of sick sinus syndrome atrial fibrillation and has a cardiac pacemaker and is on Coumadin.he also has a history of DVT. His wife states that episodically over the last 2 years he has had wounds  on his lower extremities but they have healed with home measures. Roughly 2-3 weeks ago the legs became a lot more swollen and he has had draining areas on the right anterior medial and lateral and the left medial leg. The patient's wife states that they were told to put support stockings on him but they are concerned because they worsen the blisters. this bit difficult to get the history but they think the edema has been increasing over the last several months. 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 He does not complain of chest pain orthopnea. He does not have a history of kidney issues that he is aware. Primary doctor at [facility]. He is not on diuretics. He was here 2 years ago. He was noted to have edema in his legs at that time. He had an ultrasound of his legs which I'll need to review. There was discussion about compression stockings but he is complaining clearly not using them. ABI in the right in this clinic was 1.37, 1.07 on the left 11/18/17; this is an elderly frail man who has bilateral small lower extremity wounds in the setting of very significant multifactorial edema. He probably has chronic venous insufficiency with lymphedema but it is clearly fluid overloaded last week. He apparently  has started on diuretics through his primary physician at [facility] but the patient's wife is not sure which one but states that he goes to the bathroom every 15 minutes. He is tolerating compression wraps. His wife stated that they charged him full price stating that he didn't have open wounds on his legs that would cover wraparound stockings. His edema is much better controlled 11/25/17-he is here in follow-up evaluation for bilateral lower extremity ulcers. He is tolerating compression therapy. There was one episode since last appointment with copious amounts of drainage from the right medial malleolus. We will add additional absorptive dressing, otherwise no change in treatment plan and he will follow-up next week Wound History [name], [name] ([id number]) Patient presents with 3 open wounds that have been present for approximately 2 weeks. Patient has been treating wounds in the following manner: bandages. Laboratory tests have not been performed in the last month. Patient reportedly has not tested positive for an antibiotic resistant organism. Patient reportedly has not tested positive for osteomyelitis. Patient reportedly has had testing performed to evaluate circulation in the legs. Patient experiences the following problems associated with their wounds: swelling. Patient History Information obtained from Patient. Family History Cancer - Father, No family history of Diabetes, Heart Disease, Hypertension, Kidney Disease, Lung Disease, Seizures, Stroke, Thyroid Problems. Social History Never smoker, Marital Status - Married, Alcohol Use - Never, Drug Use - No History, Caffeine Use - Daily. Medical History Eyes Patient has history of Cataracts - years ago Denies history of Glaucoma, Optic Neuritis Ear/Nose/Mouth/Throat Denies history of Chronic sinus problems/congestion, Middle ear problems Hematologic/Lymphatic Denies history of Anemia, Hemophilia, Human  Immunodeficiency Virus, Lymphedema, Sickle Cell Disease Respiratory Denies history of Aspiration, Asthma, Chronic Obstructive Pulmonary Disease (COPD), Pneumothorax, Sleep Apnea, Tuberculosis Cardiovascular Patient has history of Arrhythmia - a-fib, Deep Vein Thrombosis - both legs 2003 Denies history of Angina, Congestive Heart Failure, Coronary Artery Disease, Hypertension, Hypotension, Myocardial Infarction, Peripheral Arterial Disease, Peripheral Venous Disease, Phlebitis, Vasculitis Gastrointestinal Denies history of Cirrhosis , Colitis, Crohn s, Hepatitis A, Hepatitis B, Hepatitis C Endocrine Denies history of Type I Diabetes, Type II Diabetes Genitourinary Denies history of End Stage Renal Disease  Immunological Denies history of Lupus Erythematosus, Raynaud s, Scleroderma Integumentary (Skin) Denies history of History of Burn, History of pressure wounds Musculoskeletal Patient has history of Osteoarthritis Denies history of Gout, Rheumatoid Arthritis, Osteomyelitis Neurologic Denies history of Dementia, Neuropathy, Quadriplegia, Paraplegia, Seizure Disorder Oncologic Denies history of Received Chemotherapy, Received Radiation Psychiatric Denies history of Anorexia/bulimia, Confinement Anxiety Medical And Surgical History Notes Constitutional Symptoms (General Health) A-fib; blood clots in legs; arthritis; leg, back and hip pain [name], [name] ([id number]) Objective Constitutional Vitals Time Taken: 3:05 PM, Height: 68 in, Weight: 150 lbs, BMI: 22.8, Temperature: 98.3 F, Pulse: 111 bpm, Respiratory Rate: 16 breaths/min, Blood Pressure: 97/50 mmHg. Integumentary (Hair, Skin) Wound #5 status is Open. Original cause of wound was Blister. The wound is located on the Left,Medial Malleolus. The wound measures 1cm length x 1cm width x 0.1cm depth; 0.785cm^2 area and 0.079cm^3 volume. There is Fat Layer (Subcutaneous Tissue) Exposed exposed. There is no tunneling or  undermining noted. There is a large amount of serous drainage noted. The wound margin is flat and intact. There is medium (34-66%) red granulation within the wound bed. There is a medium (34-66%) amount of necrotic tissue within the wound bed including Adherent Slough. The periwound skin appearance exhibited: Erythema. The surrounding wound skin color is noted with erythema which is circumferential. Periwound temperature was noted as No Abnormality. The periwound has tenderness on palpation. Wound #6 status is Open. Original cause of wound was Blister. The wound is located on the Right,Medial Lower Leg. The wound measures 0.5cm length x 0.2cm width x 0.1cm depth; 0.079cm^2 area and 0.008cm^3 volume. There is Fat Layer (Subcutaneous Tissue) Exposed exposed. There is no tunneling or undermining noted. There is a small amount of serosanguineous drainage noted. The wound margin is flat and intact. There is large (67-100%) red granulation within the wound bed. There is a small (1-33%) amount of necrotic tissue within the wound bed including Adherent Slough. The periwound skin appearance exhibited: Erythema. The periwound skin appearance did not exhibit: Callus, Crepitus, Excoriation, Induration, Rash, Scarring, Dry/Scaly, Maceration, Atrophie Blanche, Cyanosis, Ecchymosis, Hemosiderin Staining, Mottled, Pallor, Rubor. The surrounding wound skin color is noted with erythema which is circumferential. Periwound temperature was noted as No Abnormality. The periwound has tenderness on palpation. Wound #7 status is Open. Original cause of wound was Gradually Appeared. The wound is located on the Right,Anterior Lower Leg. The wound measures 0.6cm length x 0.8cm width x 0.1cm depth; 0.377cm^2 area and 0.038cm^3 volume. There is no tunneling or undermining noted. There is a small amount of serosanguineous drainage noted. The wound margin is flat and intact. There is large (67-100%) pink granulation within the  wound bed. There is no necrotic tissue within the wound bed. The periwound skin appearance did not exhibit: Callus, Crepitus, Excoriation, Induration, Rash, Scarring, Dry/Scaly, Maceration, Atrophie Blanche, Cyanosis, Ecchymosis, Hemosiderin Staining, Mottled, Pallor, Rubor, Erythema. Periwound temperature was noted as No Abnormality. The periwound has tenderness on palpation. Wound #8 status is Open. Original cause of wound was Blister. The wound is located on the Right,Lateral Lower Leg. The wound measures 0.5cm length x 0.6cm width x 0.1cm depth; 0.236cm^2 area and 0.024cm^3 volume. Wound #9 status is Healed - Epithelialized. Original cause of wound was Gradually Appeared. The wound is located on the Left,Medial Lower Leg. The wound measures 0cm length x 0cm width x 0cm depth; 0cm^2 area and 0cm^3 volume. Assessment Active Problems ICD-10 L97.211 - Non-pressure chronic ulcer of right calf limited to breakdown of skin L97.221 - Non-pressure  chronic ulcer of left calf limited to breakdown of skin I87.333 - Chronic venous hypertension (idiopathic) with ulcer and inflammation of bilateral lower extremity [name], [name] ([id number]) Procedures Wound #5 Pre-procedure diagnosis of Wound #5 is a Venous Leg Ulcer located on the Left,Medial Malleolus .Severity of Tissue Pre Debridement is: Fat layer exposed. There was a Debridement ([id number]) debridement with total area of 1 sq cm performed by Bonnell Public, NP. with the following instrument(s): Curette to remove Viable and Non-Viable tissue/material including Fibrin/Slough, Skin, and Subcutaneous after achieving pain control using Other (lidocaine 4%). A time out was conducted at 15:39, prior to the start of the procedure. A Minimum amount of bleeding was controlled with Pressure. The procedure was tolerated well with a pain level of 0 throughout and a pain level of 0 following the procedure. Post Debridement Measurements: 1cm length x 1cm  width x 0.1cm depth; 0.079cm^3 volume. Character of Wound/Ulcer Post Debridement is stable. Severity of Tissue Post Debridement is: Fat layer exposed. Post procedure Diagnosis Wound #5: Same as Pre-Procedure Plan Wound Cleansing: Wound #5 Left,Medial Malleolus: Cleanse wound with mild soap and water Wound #6 Right,Medial Lower Leg: Cleanse wound with mild soap and water Wound #7 Right,Anterior Lower Leg: Cleanse wound with mild soap and water Wound #8 Right,Lateral Lower Leg: Cleanse wound with mild soap and water Anesthetic (add to Medication List): Wound #5 Left,Medial Malleolus: Topical Lidocaine 4% cream applied to wound bed prior to debridement (In Clinic Only). Skin Barriers/Peri-Wound Care: Wound #5 Left,Medial Malleolus: Moisturizing lotion Other: - OTC Lotrimin Cream on top of right foot. Wound #6 Right,Medial Lower Leg: Moisturizing lotion Other: - OTC Lotrimin Cream on top of right foot. Wound #7 Right,Anterior Lower Leg: Moisturizing lotion Other: - OTC Lotrimin Cream on top of right foot. Wound #8 Right,Lateral Lower Leg: Moisturizing lotion Other: - OTC Lotrimin Cream on top of right foot. Primary Wound Dressing: Wound #5 Left,Medial Malleolus: Silvercel Non-Adherent Wound #6 Right,Medial Lower Leg: Silvercel Non-Adherent Foam Wound #7 Right,Anterior Lower Leg: [name], RHEW. ([id number]) Silvercel Non-Adherent Drawtex Wound #8 Right,Lateral Lower Leg: Silvercel Non-Adherent Secondary Dressing: Wound #5 Left,Medial Malleolus: ABD pad Wound #6 Right,Medial Lower Leg: ABD pad Wound #7 Right,Anterior Lower Leg: ABD pad Wound #8 Right,Lateral Lower Leg: ABD pad Dressing Change Frequency: Wound #5 Left,Medial Malleolus: Change Dressing Monday, Wednesday, Friday Wound #6 Right,Medial Lower Leg: Change Dressing Monday, Wednesday, Friday Wound #7 Right,Anterior Lower Leg: Change Dressing Monday, Wednesday, Friday Wound #8 Right,Lateral Lower  Leg: Change Dressing Monday, Wednesday, Friday Follow-up Appointments: Wound #5 Left,Medial Malleolus: Return Appointment in 1 week. Wound #6 Right,Medial Lower Leg: Return Appointment in 1 week. Wound #7 Right,Anterior Lower Leg: Return Appointment in 1 week. Wound #8 Right,Lateral Lower Leg: Return Appointment in 1 week. Edema Control: Wound #5 Left,Medial Malleolus: 3 Layer Compression System - Bilateral Elevate legs to the level of the heart and pump ankles as often as possible Wound #6 Right,Medial Lower Leg: 3 Layer Compression System - Bilateral Elevate legs to the level of the heart and pump ankles as often as possible Wound #7 Right,Anterior Lower Leg: 3 Layer Compression System - Bilateral Elevate legs to the level of the heart and pump ankles as often as possible Wound #8 Right,Lateral Lower Leg: 3 Layer Compression System - Bilateral Elevate legs to the level of the heart and pump ankles as often as possible Additional Orders / Instructions: Wound #5 Left,Medial Malleolus: Vitamin A; Vitamin C, Zinc Activity as tolerated Wound #6 Right,Medial Lower Leg: Vitamin A; Vitamin  C, Zinc Activity as tolerated Wound #7 Right,Anterior Lower Leg: Vitamin A; Vitamin C, Zinc Activity as tolerated Wound #8 Right,Lateral Lower Leg: Vitamin A; Vitamin C, Zinc Activity as tolerated Home Health: Wound #5 Left,Medial Malleolus: [facility] for Sk[facility] [name], [name] ([id number]) Home Health Nurse may visit PRN to address patient s wound care needs. FACE TO FACE ENCOUNTER: MEDICARE and MEDICAID PATIENTS: I certify that this patient is under my care and that I had a face-to-face encounter that meets the physician face-to-face encounter requirements with this patient on this date. The encounter with the patient was in whole or in part for the following MEDICAL CONDITION: (primary reason for Home Healthcare) MEDICAL NECESSITY: I certify, that based on my  findings, NURSING services are a medically necessary home health service. HOME BOUND STATUS: I certify that my clinical findings support that this patient is homebound (i.e., Due to illness or injury, pt requires aid of supportive devices such as crutches, cane, wheelchairs, walkers, the use of special transportation or the assistance of another person to leave their place of residence. There is a normal inability to leave the home and doing so requires considerable and taxing effort. Other absences are for medical reasons / religious services and are infrequent or of short duration when for other reasons). If current dressing causes regression in wound condition, may D/C ordered dressing product/s and apply Normal Saline Moist Dressing daily until next Wound Healing Center / Other MD appointment. Notify Wound Healing Center of regression in wound condition at [phone]. Please direct any NON-WOUND related issues/requests for orders to patient's Primary Care Physician Wound #6 Right,Medial Lower Leg: Initiate Home Health for Skilled Nursing Home Health Nurse may visit PRN to address patient s wound care needs. FACE TO FACE ENCOUNTER: MEDICARE and MEDICAID PATIENTS: I certify that this patient is under my care and that I had a face-to-face encounter that meets the physician face-to-face encounter requirements with this patient on this date. The encounter with the patient was in whole or in part for the following MEDICAL CONDITION: (primary reason for Home Healthcare) MEDICAL NECESSITY: I certify, that based on my findings, NURSING services are a medically necessary home health service. HOME BOUND STATUS: I certify that my clinical findings support that this patient is homebound (i.e., Due to illness or injury, pt requires aid of supportive devices such as crutches, cane, wheelchairs, walkers, the use of special transportation or the assistance of another person to leave their place of residence.  There is a normal inability to leave the home and doing so requires considerable and taxing effort. Other absences are for medical reasons / religious services and are infrequent or of short duration when for other reasons). If current dressing causes regression in wound condition, may D/C ordered dressing product/s and apply Normal Saline Moist Dressing daily until next Wound Healing Center / Other MD appointment. Notify Wound Healing Center of regression in wound condition at [phone]. Please direct any NON-WOUND related issues/requests for orders to patient's Primary Care Physician Wound #7 Right,Anterior Lower Leg: Initiate Home Health for Skilled Nursing Home Health Nurse may visit PRN to address patient s wound care needs. FACE TO FACE ENCOUNTER: MEDICARE and MEDICAID PATIENTS: I certify that this patient is under my care and that I had a face-to-face encounter that meets the physician face-to-face encounter requirements with this patient on this date. The encounter with the patient was in whole or in part for the following MEDICAL CONDITION: (primary reason for Home Healthcare)  MEDICAL NECESSITY: I certify, that based on my findings, NURSING services are a medically necessary home health service. HOME BOUND STATUS: I certify that my clinical findings support that this patient is homebound (i.e., Due to illness or injury, pt requires aid of supportive devices such as crutches, cane, wheelchairs, walkers, the use of special transportation or the assistance of another person to leave their place of residence. There is a normal inability to leave the home and doing so requires considerable and taxing effort. Other absences are for medical reasons / religious services and are infrequent or of short duration when for other reasons). If current dressing causes regression in wound condition, may D/C ordered dressing product/s and apply Normal Saline Moist Dressing daily until next Wound  Healing Center / Other MD appointment. Notify Wound Healing Center of regression in wound condition at [phone]. Please direct any NON-WOUND related issues/requests for orders to patient's Primary Care Physician Wound #8 Right,Lateral Lower Leg: Initiate Home Health for Skilled Nursing Home Health Nurse may visit PRN to address patient s wound care needs. FACE TO FACE ENCOUNTER: MEDICARE and MEDICAID PATIENTS: I certify that this patient is under my care and that I had a face-to-face encounter that meets the physician face-to-face encounter requirements with this patient on this date. The encounter with the patient was in whole or in part for the following MEDICAL CONDITION: (primary reason for Home Healthcare) MEDICAL NECESSITY: I certify, that based on my findings, NURSING services are a medically necessary home health service. HOME BOUND STATUS: I certify that my clinical findings support that this patient is homebound (i.e., Due to illness or injury, pt requires aid of supportive devices such as crutches, cane, wheelchairs, walkers, the use of special transportation or the assistance of another person to leave their place of residence. There is a normal inability to leave the home and doing so requires considerable and taxing effort. Other absences are for medical reasons / religious services and are infrequent or of short duration when for other reasons). If current dressing causes regression in wound condition, may D/C ordered dressing product/s and apply Normal Saline Moist Dressing daily until next Wound Healing Center / Other MD appointment. Notify Wound Healing Center of regression in [name] ([id number]) wound condition at [phone]. Please direct any NON-WOUND related issues/requests for orders to patient's Primary Care Physician Electronic Signature(s) Signed: 11/25/2017 5:33:01 PM By: Bonnell Publicoulter, Alynah Schone Previous Signature: 11/25/2017 3:52:17 PM Version By: Bonnell Publicoulter,  Vernelle Wisner Entered By: Bonnell Publicoulter, Elyna Pangilinan on 11/25/2017 17:33:01 Donald MouseWILLIFORD, [name] ([id number]) -------------------------------------------------------------------------------- ROS/PFSH Details Patient Name: Donald MuscaWILLIFORD, Catcher C. Date of Service: 11/25/2017 2:45 PM Medical Record Number: [id number] Patient Account Number: [id number] Date of Birth/Sex: 11/19/1928 (84 y.o. M) Treating RN: Huel CoventryWoody, Kim Primary Care Provider: Guerry BruinISOVEC, RICHARD Other Clinician: Referring Provider: Guerry BruinISOVEC, RICHARD Treating Provider/Extender: Kathreen Cosieroulter, Peirce Deveney Weeks in Treatment: 2 Label Progress Note Print Version as History and Physical for this encounter Information Obtained From Patient Wound History Do you currently have one or more open woundso Yes How many open wounds do you currently haveo 3 Approximately how long have you had your woundso 2 weeks How have you been treating your wound(s) until nowo bandages Has your wound(s) ever healed and then re-openedo No Have you had any lab work done in the past montho No Have you tested positive for an antibiotic resistant organism (MRSA, VRE)o No Have you tested positive for osteomyelitis (bone infection)o No Have you had any tests for circulation on your legso Yes Who ordered the testo [facility]  Where was the test doneo 2014 Have you had other problems associated with your woundso Swelling Constitutional Symptoms (General Health) Medical History: Past Medical History Notes: A-fib; blood clots in legs; arthritis; leg, back and hip pain Eyes Medical History: Positive for: Cataracts - years ago Negative for: Glaucoma; Optic Neuritis Ear/Nose/Mouth/Throat Medical History: Negative for: Chronic sinus problems/congestion; Middle ear problems Hematologic/Lymphatic Medical History: Negative for: Anemia; Hemophilia; Human Immunodeficiency Virus; Lymphedema; Sickle Cell Disease Respiratory Medical History: Negative for: Aspiration; Asthma; Chronic Obstructive Pulmonary Disease  (COPD); Pneumothorax; Sleep Apnea; Tuberculosis Cardiovascular Medical History: Positive for: Arrhythmia - a-fib; Deep Vein Thrombosis - both legs 2003 [name], [name] ([id number]) Negative for: Angina; Congestive Heart Failure; Coronary Artery Disease; Hypertension; Hypotension; Myocardial Infarction; Peripheral Arterial Disease; Peripheral Venous Disease; Phlebitis; Vasculitis Gastrointestinal Medical History: Negative for: Cirrhosis ; Colitis; Crohnos; Hepatitis A; Hepatitis B; Hepatitis C Endocrine Medical History: Negative for: Type I Diabetes; Type II Diabetes Genitourinary Medical History: Negative for: End Stage Renal Disease Immunological Medical History: Negative for: Lupus Erythematosus; Raynaudos; Scleroderma Integumentary (Skin) Medical History: Negative for: History of Burn; History of pressure wounds Musculoskeletal Medical History: Positive for: Osteoarthritis Negative for: Gout; Rheumatoid Arthritis; Osteomyelitis Neurologic Medical History: Negative for: Dementia; Neuropathy; Quadriplegia; Paraplegia; Seizure Disorder Oncologic Medical History: Negative for: Received Chemotherapy; Received Radiation Psychiatric Medical History: Negative for: Anorexia/bulimia; Confinement Anxiety HBO Extended History Items Eyes: Cataracts Immunizations Pneumococcal Vaccine: Received Pneumococcal Vaccination: No Implantable Devices [name], [name] ([id number]) Family and Social History Cancer: Yes - Father; Diabetes: No; Heart Disease: No; Hypertension: No; Kidney Disease: No; Lung Disease: No; Seizures: No; Stroke: No; Thyroid Problems: No; Never smoker; Marital Status - Married; Alcohol Use: Never; Drug Use: No History; Caffeine Use: Daily; Financial Concerns: No; Food, Clothing or Shelter Needs: No; Support System Lacking: No; Transportation Concerns: No; Advanced Directives: No; Patient does not want information on Advanced Directives; Do not resuscitate: No;  Living Will: No; Medical Power of Attorney: No Physician Affirmation I have reviewed and agree with the above information. Electronic Signature(s) Signed: 11/25/2017 5:31:19 PM By: Bonnell Public Signed: 11/27/2017 4:43:23 PM By: Elliot Gurney, [name], RN, CWS, [name], [name] Entered By: Bonnell Public on 11/25/2017 15:52:05 [name] ([id number]) -------------------------------------------------------------------------------- SuperBill Details Patient Name: Donald Musca C. Date of Service: 11/25/2017 Medical Record Number: [id number] Patient Account Number: [id number] Date of Birth/Sex: 11-08-1928 (84 y.o. M) Treating RN: Huel Coventry Primary Care Provider: Guerry Bruin Other Clinician: Referring Provider: Guerry Bruin Treating Provider/Extender: Kathreen Cosier in Treatment: 2 Diagnosis Coding ICD-10 Codes Code Description [phone] Non-pressure chronic ulcer of right calf limited to breakdown of skin L97.221 Non-pressure chronic ulcer of left calf limited to breakdown of skin I87.333 Chronic venous hypertension (idiopathic) with ulcer and inflammation of bilateral lower extremity Facility Procedures CPT4 Code: [id number] Description: 11042 - DEB SUBQ TISSUE 20 SQ CM/< ICD-10 Diagnosis Description L97.221 Non-pressure chronic ulcer of left calf limited to breakdown Modifier: of skin Quantity: 1 Physician Procedures CPT4 Code: [id number] Description: 11042 - WC PHYS SUBQ TISS 20 SQ CM ICD-10 Diagnosis Description L97.221 Non-pressure chronic ulcer of left calf limited to breakdown Modifier: of skin Quantity: 1 Electronic Signature(s) Signed: 11/25/2017 3:54:56 PM By: Bonnell Public Entered By: Bonnell Public on 11/25/2017 15:54:56

## 2017-11-30 DIAGNOSIS — L97321 Non-pressure chronic ulcer of left ankle limited to breakdown of skin: Secondary | ICD-10-CM | POA: Diagnosis not present

## 2017-11-30 DIAGNOSIS — I10 Essential (primary) hypertension: Secondary | ICD-10-CM | POA: Diagnosis not present

## 2017-11-30 DIAGNOSIS — L97221 Non-pressure chronic ulcer of left calf limited to breakdown of skin: Secondary | ICD-10-CM | POA: Diagnosis not present

## 2017-11-30 DIAGNOSIS — L97211 Non-pressure chronic ulcer of right calf limited to breakdown of skin: Secondary | ICD-10-CM | POA: Diagnosis not present

## 2017-11-30 DIAGNOSIS — I4891 Unspecified atrial fibrillation: Secondary | ICD-10-CM | POA: Diagnosis not present

## 2017-11-30 DIAGNOSIS — I87333 Chronic venous hypertension (idiopathic) with ulcer and inflammation of bilateral lower extremity: Secondary | ICD-10-CM | POA: Diagnosis not present

## 2017-12-02 ENCOUNTER — Ambulatory Visit: Payer: Medicare Other | Admitting: Internal Medicine

## 2017-12-02 ENCOUNTER — Encounter: Payer: Medicare Other | Admitting: Internal Medicine

## 2017-12-02 DIAGNOSIS — S81801A Unspecified open wound, right lower leg, initial encounter: Secondary | ICD-10-CM | POA: Diagnosis not present

## 2017-12-02 DIAGNOSIS — L97211 Non-pressure chronic ulcer of right calf limited to breakdown of skin: Secondary | ICD-10-CM | POA: Diagnosis not present

## 2017-12-02 DIAGNOSIS — I872 Venous insufficiency (chronic) (peripheral): Secondary | ICD-10-CM | POA: Diagnosis not present

## 2017-12-02 DIAGNOSIS — I4891 Unspecified atrial fibrillation: Secondary | ICD-10-CM | POA: Diagnosis not present

## 2017-12-02 DIAGNOSIS — I499 Cardiac arrhythmia, unspecified: Secondary | ICD-10-CM | POA: Diagnosis not present

## 2017-12-02 DIAGNOSIS — Z86718 Personal history of other venous thrombosis and embolism: Secondary | ICD-10-CM | POA: Diagnosis not present

## 2017-12-02 DIAGNOSIS — S81001A Unspecified open wound, right knee, initial encounter: Secondary | ICD-10-CM | POA: Diagnosis not present

## 2017-12-02 DIAGNOSIS — S91302A Unspecified open wound, left foot, initial encounter: Secondary | ICD-10-CM | POA: Diagnosis not present

## 2017-12-02 DIAGNOSIS — L97312 Non-pressure chronic ulcer of right ankle with fat layer exposed: Secondary | ICD-10-CM | POA: Diagnosis not present

## 2017-12-02 DIAGNOSIS — S80821A Blister (nonthermal), right lower leg, initial encounter: Secondary | ICD-10-CM | POA: Diagnosis not present

## 2017-12-02 DIAGNOSIS — M1990 Unspecified osteoarthritis, unspecified site: Secondary | ICD-10-CM | POA: Diagnosis not present

## 2017-12-02 DIAGNOSIS — L97322 Non-pressure chronic ulcer of left ankle with fat layer exposed: Secondary | ICD-10-CM | POA: Diagnosis not present

## 2017-12-03 ENCOUNTER — Encounter: Payer: Self-pay | Admitting: Internal Medicine

## 2017-12-03 ENCOUNTER — Ambulatory Visit (INDEPENDENT_AMBULATORY_CARE_PROVIDER_SITE_OTHER): Payer: Medicare Other | Admitting: Internal Medicine

## 2017-12-03 VITALS — BP 116/70 | HR 62 | Ht 66.0 in

## 2017-12-03 DIAGNOSIS — Z950 Presence of cardiac pacemaker: Secondary | ICD-10-CM

## 2017-12-03 DIAGNOSIS — I480 Paroxysmal atrial fibrillation: Secondary | ICD-10-CM | POA: Diagnosis not present

## 2017-12-03 DIAGNOSIS — I495 Sick sinus syndrome: Secondary | ICD-10-CM

## 2017-12-03 LAB — CUP PACEART INCLINIC DEVICE CHECK
Date Time Interrogation Session: 20190328175245
Implantable Lead Implant Date: 20110802
Implantable Lead Location: 753859
Implantable Pulse Generator Implant Date: 20110802
Lead Channel Impedance Value: 320 Ohm
Lead Channel Pacing Threshold Amplitude: 1 V
Lead Channel Pacing Threshold Pulse Width: 0.4 ms
Lead Channel Sensing Intrinsic Amplitude: 1.035 mV
MDC IDC LEAD IMPLANT DT: 20110802
MDC IDC LEAD LOCATION: 753860
MDC IDC MSMT BATTERY VOLTAGE: 2.9 V
MDC IDC MSMT LEADCHNL RA IMPEDANCE VALUE: 340 Ohm
MDC IDC SET LEADCHNL RA PACING AMPLITUDE: 2 V
MDC IDC SET LEADCHNL RV SENSING SENSITIVITY: 0.45 mV
MDC IDC STAT BRADY RA PERCENT PACED: 73.57 %

## 2017-12-03 NOTE — Progress Notes (Signed)
PCP: [name], MD   Primary EP:  Dr Ilsa Iha Naill is a 84 y.o. male who presents today for routine electrophysiology followup.  Since last being seen in our clinic, the patient reports doing reasonably well.  His primary concern is with edema.  He is not very active.  In a wheelchair today.   Today, he denies symptoms of palpitations, chest pain, shortness of breath,dizziness, presyncope, or syncope.  The patient is otherwise without complaint today.   Past Medical History:  Diagnosis Date  . Atrial tachycardia (HCC)   . DDD (degenerative disc disease)   . DJD (degenerative joint disease)   . GERD (gastroesophageal reflux disease)   . Nocturia   . PAF (paroxysmal atrial fibrillation) (HCC)   . Phlebitis   . Scoliosis   . Spinal stenosis   . Tachycardia-bradycardia syndrome (HCC)    s/p PPM by ST 04/08/10, RV lead has dislodged but  appears stable AAIR   Past Surgical History:  Procedure Laterality Date  . INGUINAL HERNIA REPAIR  1992  . PACEMAKER INSERTION  04/08/10   [employer] - Revo - implanted by Dr. Deborah Chalk 04/08/10  RV lead has dislodged, though pt is stable AAIR    ROS- all systems are reviewed and negative except as per HPI above  Current Outpatient Medications  Medication Sig Dispense Refill  . Ascorbic Acid 500 MG CAPS Take 500 mg by mouth 2 (two) times daily.    . Coenzyme Q10 (CO Q 10 PO) Take 100 mg by mouth 2 (two) times daily.     . fish oil-omega-3 fatty acids 1000 MG capsule Take 2 g by mouth daily.      . Hydrocodone-Chlorpheniramine (CHLORPHENIRAMINE-HYDROCODONE PO) Take 325 mg by mouth 4 (four) times daily.    . Multiple Vitamin (MULTIVITAMIN) tablet Take 1 tablet by mouth 2 (two) times daily.      . sotalol (BETAPACE) 160 MG tablet TAKE 1 TABLET BY MOUTH TWICE DAILY 180 tablet 3  . VITAMIN D, CHOLECALCIFEROL, PO Take 5,000 Units by mouth daily.     Marland Kitchen VITAMIN E PO Take 400 Units by mouth daily.     Marland Kitchen warfarin (COUMADIN) 2 MG tablet Take  as directed bu Dr Wylene Simmer 90 tablet 3   No current facility-administered medications for this visit.     Physical Exam: Vitals:   12/03/17 1532  BP: 116/70  Pulse: 62  SpO2: 99%  Height: 5\' 6"  (1.676 m)    GEN- The patient is elderly and frail appearing, alert and oriented x 3 today.   Head- normocephalic, atraumatic Eyes-  Sclera clear, conjunctiva pink Ears- hearing intact Oropharynx- clear Lungs- Clear to ausculation bilaterally, normal work of breathing Chest- pacemaker pocket is well healed Heart- Regular rate and rhythm  GI- soft, NT, ND, + BS Extremities- no clubbing, cyanosis, +3 edema with venous stasis changes  Pacemaker interrogation- reviewed in detail today,  See PACEART report  ekg tracing ordered today is personally reviewed and shows atrial paced, first degree AV block, nonspecific ST/T changes  Assessment and Plan:  1. Symptomatic sinus bradycardia  Normal pacemaker function See Pace Art report No changes today S/p MDT Revo PPM by Dr Deborah Chalk with RV lead perforation/ dislodgement.  Programmed AAIR and having no issues. He is clear that he does not want lead revision.   2. Paroxysmal atrial fibrillation afib burden is 19 %.  atach and nonsustained VT also noted on devicee interrogation On coumadin On sotalol, qt  is stable Bmet, mg today  3. Venous insufficiency Marked pedal edema.  Receiving care at the wound center Does not recall which diuretic he is taking I will reach out to Dr Deneen Hartsisovec's office for records  carelink Return to see EP PA in a year  Hillis RangeJames Adonus Uselman MD, [facility] NorthwestFACC 12/03/2017 3:56 PM

## 2017-12-03 NOTE — Patient Instructions (Addendum)
Medication Instructions:  Your physician recommends that you continue on your current medications as directed. Please refer to the Current Medication list given to you today.  Labwork: You will get lab work today:  BMET and Magnesium.    Testing/Procedures: None ordered.  Follow-Up: Your physician wants you to follow-up in: one year with Francis Dowseenee Ursuy, PA.   You will receive a reminder letter in the mail two months in advance. If you don't receive a letter, please call our office to schedule the follow-up appointment.  Remote monitoring is used to monitor your Pacemaker from home. This monitoring reduces the number of office visits required to check your device to one time per year. It allows us to keep an eye on the functioning of your device to ensure it is working properly. You are scheduled for a device check from home on 03/04/2018. You may send your transmission at any time that day. If you have a wireless device, the transmission will be sent automatically. After your physician reviews your transmission, you will receive a postcard with your next transmission date.  Any Other Special Instructions Will Be Listed Below (If Applicable).  If you need a refill on your cardiac medications before your next appointment, please call your pharmacy.

## 2017-12-04 ENCOUNTER — Telehealth: Payer: Self-pay

## 2017-12-04 DIAGNOSIS — I87333 Chronic venous hypertension (idiopathic) with ulcer and inflammation of bilateral lower extremity: Secondary | ICD-10-CM | POA: Diagnosis not present

## 2017-12-04 DIAGNOSIS — L97211 Non-pressure chronic ulcer of right calf limited to breakdown of skin: Secondary | ICD-10-CM | POA: Diagnosis not present

## 2017-12-04 DIAGNOSIS — I10 Essential (primary) hypertension: Secondary | ICD-10-CM | POA: Diagnosis not present

## 2017-12-04 DIAGNOSIS — L97321 Non-pressure chronic ulcer of left ankle limited to breakdown of skin: Secondary | ICD-10-CM | POA: Diagnosis not present

## 2017-12-04 DIAGNOSIS — L97221 Non-pressure chronic ulcer of left calf limited to breakdown of skin: Secondary | ICD-10-CM | POA: Diagnosis not present

## 2017-12-04 DIAGNOSIS — I4891 Unspecified atrial fibrillation: Secondary | ICD-10-CM | POA: Diagnosis not present

## 2017-12-04 LAB — BASIC METABOLIC PANEL
BUN/Creatinine Ratio: 29 — ABNORMAL HIGH (ref 10–24)
BUN: 27 mg/dL (ref 8–27)
CALCIUM: 8.8 mg/dL (ref 8.6–10.2)
CO2: 29 mmol/L (ref 20–29)
CREATININE: 0.94 mg/dL (ref 0.76–1.27)
Chloride: 103 mmol/L (ref 96–106)
GFR calc Af Amer: 85 mL/min/{1.73_m2} (ref >59)
GFR calc non Af Amer: 73 mL/min/{1.73_m2} (ref >59)
GLUCOSE: 93 mg/dL (ref 65–99)
POTASSIUM: 4.1 mmol/L (ref 3.5–5.2)
Sodium: 143 mmol/L (ref 134–144)

## 2017-12-04 LAB — MAGNESIUM: MAGNESIUM: 2 mg/dL (ref 1.6–2.3)

## 2017-12-04 NOTE — Progress Notes (Signed)
MALIKHI, OGAN ([id number]) Visit Report for 12/02/2017 HPI Details Patient Name: [name], [name]. Date of Service: 12/02/2017 3:45 PM Medical Record Number: [id number] Patient Account Number: [id number] Date of Birth/Sex: 1929-05-04 (84 y.o. M) Treating RN: Huel Coventry Primary Care Provider: Guerry Bruin Other Clinician: Referring Provider: Guerry Bruin Treating Provider/Extender: Altamese [place] in Treatment: 3 History of Present Illness HPI Description: Pleasant 84 year old with history of chronic venous insufficiency, remote history of DVT, atrial fibrillation (on Coumadin). No history of diabetes. He says that he developed worsening left lower extremity swelling, blisters, and subsequent ulcerations in late October 2016. He was started on Augmentin for left lower extremity cellulitis, which has improved. Arterial ultrasound 07/27/2015 showed no significant peripheral arterial disease. Awaiting venous ultrasound. Performing dressing changes with silver alginate and using a Tubigrip for edema control. He denies any significant pain. No claudication or rest pain. Ambulating per his baseline with a walker. No fever or chills. Moderate drainage. 10/10/15; the patient arrives back today having not been seen in almost 2 months. He has no open wounds on his legs. He has a history of venous insufficiency, arterial insufficiency. He has chronic Coumadin use secondary to atrial fibrillation and a remote history of DVT. Fortunately as he has not been here in 2 months we were not able to order him stockings. He is not able to get on standard compression hose. Juxtalite stockings would cost him probably about $100 appear, the couple was not able to afford this. 11/11/17 READMISSION This is an 84 year old man who is not a diabetic. He does have a history of sick sinus syndrome atrial fibrillation and has a cardiac pacemaker and is on Coumadin.he also has a history of DVT. His wife  states that episodically over the last 2 years he has had wounds on his lower extremities but they have healed with home measures. Roughly 2-3 weeks ago the legs became a lot more swollen and he has had draining areas on the right anterior medial and lateral and the left medial leg. The patient's wife states that they were told to put support stockings on him but they are concerned because they worsen the blisters. this bit difficult to get the history but they think the edema has been increasing over the last several months. 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 He does not complain of chest pain orthopnea. He does not have a history of kidney issues that he is aware. Primary doctor at [facility]. He is not on diuretics. He was here 2 years ago. He was noted to have edema in his legs at that time. He had an ultrasound of his legs which I'll need to review. There was discussion about compression stockings but he is complaining clearly not using them. ABI in the right in this clinic was 1.37, 1.07 on the left 11/18/17; this is an 84 year old man who has bilateral small lower extremity wounds in the setting of very significant multifactorial edema. He probably has chronic venous insufficiency with lymphedema but it is clearly fluid overloaded last week. He apparently has started on diuretics through his primary physician at [facility] but the patient's wife is not sure which one but states that he goes to the bathroom every 15 minutes. He is tolerating compression wraps. His wife stated that they charged him full price stating that he didn't have open wounds on his legs that would cover wraparound stockings. His edema is much  better controlled 11/25/17-he is here in follow-up evaluation for bilateral lower extremity ulcers. He is tolerating compression therapy. There was one episode since last appointment with copious amounts of drainage  from the right medial malleolus. We will add additional absorptive dressing, otherwise no change in treatment plan and he will follow-up next week 12/02/17; since I last saw this man is edema control is a lot better and I think this is a combination of diuretic therapy and are 3 layer compression. He has severe bilateral venous insufficiency with hemosiderin deposition and likely chronic stasis dermatitis/inflammation. In spite of the edema control he has several weeping areas on both legs including the left lateral leg, area on the right posterior leg. He also has an excoriated area just below his right tibial tuberosity. Fortuitously the patient has a follow-up with cardiology tomorrow [name], [name] ([id number]) Electronic Signature(s) Signed: 12/03/2017 8:15:41 AM By: Baltazar Najjar MD Entered By: Baltazar Najjar on 12/03/2017 36:64:40 [name] ([id number]) -------------------------------------------------------------------------------- Physical Exam Details Patient Name: Donald Musca C. Date of Service: 12/02/2017 3:45 PM Medical Record Number: [id number] Patient Account Number: [id number] Date of Birth/Sex: 10-Jul-1929 (84 y.o. M) Treating RN: Huel Coventry Primary Care Provider: Guerry Bruin Other Clinician: Referring Provider: Guerry Bruin Treating Provider/Extender: Altamese [place] in Treatment: 3 Constitutional Sitting or standing Blood Pressure is within target range for patient.. Pulse regular and within target range for patient.Marland Kitchen Respirations regular, non-labored and within target range.. Temperature is normal and within the target range for the patient.Marland Kitchen appears in no distress,frail elderly gentleman. Respiratory Respiratory effort is easy and symmetric bilaterally. Rate is normal at rest and on room air.. Bilateral breath sounds are clear and equal in all lobes with no wheezes, rales or rhonchi.. Cardiovascular A. fib. No S3. JVP is still  elevated to the angle of the jaw. Pedal pulses palpable but clearly reduced. Edema present in both extremities.however this is much better than I remember from 2 weeks ago. Lymphatic none palpable in the popliteal or inguinal area. Integumentary (Hair, Skin) other than venous stasis changes in his lower legs I see no other systemic skin issues. Psychiatric No evidence of depression, anxiety, or agitation. Calm, cooperative, and communicative. Appropriate interactions and affect.. Notes wound exam; oThere are bilateral weeping edema fluid areas on both legs. These areas are by themselves not wide open however they are certainly not healed. Most problematic lead the left posterior lateral leg, areas on the right posterior leg. His edema control is much better Electronic Signature(s) Signed: 12/03/2017 8:15:41 AM By: Baltazar Najjar MD Entered By: Baltazar Najjar on 12/03/2017 08:11:14 [name] ([id number]) -------------------------------------------------------------------------------- Physician Orders Details Patient Name: [name] Date of Service: 12/02/2017 3:45 PM Medical Record Number: [id number] Patient Account Number: [id number] Date of Birth/Sex: May 02, 1929 (84 y.o. M) Treating RN: Huel Coventry Primary Care Provider: Guerry Bruin Other Clinician: Referring Provider: Guerry Bruin Treating Provider/Extender: Altamese [place] in Treatment: 3 Verbal / Phone Orders: No Diagnosis Coding Wound Cleansing Wound #10 Right,Medial Malleolus o Cleanse wound with mild soap and water - HHRN please wash legs Wound #11 Left,Dorsal Foot o Cleanse wound with mild soap and water - HHRN please wash legs Wound #12 Right Knee o Cleanse wound with mild soap and water - HHRN please wash legs Wound #5 Left,Medial Malleolus o Cleanse wound with mild soap and water - HHRN please wash legs Wound #7 Right,Anterior Lower Leg o Cleanse wound with mild soap and  water - HHRN please wash legs Anesthetic (  add to Medication List) Wound #10 Right,Medial Malleolus o Topical Lidocaine 4% cream applied to wound bed prior to debridement (In Clinic Only). Wound #11 Left,Dorsal Foot o Topical Lidocaine 4% cream applied to wound bed prior to debridement (In Clinic Only). Wound #12 Right Knee o Topical Lidocaine 4% cream applied to wound bed prior to debridement (In Clinic Only). Wound #5 Left,Medial Malleolus o Topical Lidocaine 4% cream applied to wound bed prior to debridement (In Clinic Only). Wound #7 Right,Anterior Lower Leg o Topical Lidocaine 4% cream applied to wound bed prior to debridement (In Clinic Only). Skin Barriers/Peri-Wound Care Wound #10 Right,Medial Malleolus o Moisturizing lotion - HHRN to moisturize o Other: - OTC Lotrimin Cream on top of right foot. Wound #11 Left,Dorsal Foot o Moisturizing lotion - HHRN to moisturize o Other: - OTC Lotrimin Cream on top of right foot. Wound #12 Right Knee o Moisturizing lotion - HHRN to moisturize o Other: - OTC Lotrimin Cream on top of right foot. [name], [name] ([id number]) Wound #5 Left,Medial Malleolus o Moisturizing lotion - HHRN to moisturize o Other: - OTC Lotrimin Cream on top of right foot. Wound #7 Right,Anterior Lower Leg o Moisturizing lotion - HHRN to moisturize o Other: - OTC Lotrimin Cream on top of right foot. Primary Wound Dressing Wound #10 Right,Medial Malleolus o Silver Alginate Wound #11 Left,Dorsal Foot o Silver Alginate Wound #12 Right Knee o Silver Alginate Wound #5 Left,Medial Malleolus o Silver Alginate Wound #7 Right,Anterior Lower Leg o Silver Alginate Secondary Dressing Wound #10 Right,Medial Malleolus o ABD pad o Drawtex Wound #11 Left,Dorsal Foot o ABD pad o Drawtex Wound #12 Right Knee o ABD pad o Drawtex Wound #5 Left,Medial Malleolus o ABD pad o Drawtex Wound #7 Right,Anterior Lower  Leg o ABD pad o Drawtex Dressing Change Frequency Wound #10 Right,Medial Malleolus o Change Dressing Monday, Wednesday, Friday Wound #11 Left,Dorsal Foot o Change Dressing Monday, Wednesday, Friday Wound #12 Right Knee o Change Dressing Monday, Wednesday, Friday ARMOUR, VILLANUEVA ([id number]) Wound #5 Left,Medial Malleolus o Change Dressing Monday, Wednesday, Friday Wound #7 Right,Anterior Lower Leg o Change Dressing Monday, Wednesday, Friday Follow-up Appointments Wound #10 Right,Medial Malleolus o Return Appointment in 1 week. Wound #11 Left,Dorsal Foot o Return Appointment in 1 week. Wound #12 Right Knee o Return Appointment in 1 week. Wound #5 Left,Medial Malleolus o Return Appointment in 1 week. Wound #7 Right,Anterior Lower Leg o Return Appointment in 1 week. Edema Control Wound #10 Right,Medial Malleolus o 3 Layer Compression System - Bilateral o Elevate legs to the level of the heart and pump ankles as often as possible Wound #11 Left,Dorsal Foot o 3 Layer Compression System - Bilateral o Elevate legs to the level of the heart and pump ankles as often as possible Wound #12 Right Knee o 3 Layer Compression System - Bilateral o Elevate legs to the level of the heart and pump ankles as often as possible Wound #5 Left,Medial Malleolus o 3 Layer Compression System - Bilateral o Elevate legs to the level of the heart and pump ankles as often as possible Wound #7 Right,Anterior Lower Leg o 3 Layer Compression System - Bilateral o Elevate legs to the level of the heart and pump ankles as often as possible Additional Orders / Instructions Wound #10 Right,Medial Malleolus o Vitamin A; Vitamin C, Zinc o Activity as tolerated Wound #11 Left,Dorsal Foot o Vitamin A; Vitamin C, Zinc o Activity as tolerated Wound #12 Right Knee o Vitamin A; Vitamin C, Zinc o Activity as  tolerated [name], [name]  ([id number]) Wound #5 Left,Medial Malleolus o Vitamin A; Vitamin C, Zinc o Activity as tolerated Wound #7 Right,Anterior Lower Leg o Vitamin A; Vitamin C, Zinc o Activity as tolerated Home Health Wound #10 Right,Medial Malleolus o Initiate Home Health for Skilled Nursing o Home Health Nurse may visit PRN to address patientos wound care needs. o FACE TO FACE ENCOUNTER: MEDICARE and MEDICAID PATIENTS: I certify that this patient is under my care and that I had a face-to-face encounter that meets the physician face-to-face encounter requirements with this patient on this date. The encounter with the patient was in whole or in part for the following MEDICAL CONDITION: (primary reason for Home Healthcare) MEDICAL NECESSITY: I certify, that based on my findings, NURSING services are a medically necessary home health service. HOME BOUND STATUS: I certify that my clinical findings support that this patient is homebound (i.e., Due to illness or injury, pt requires aid of supportive devices such as crutches, cane, wheelchairs, walkers, the use of special transportation or the assistance of another person to leave their place of residence. There is a normal inability to leave the home and doing so requires considerable and taxing effort. Other absences are for medical reasons / religious services and are infrequent or of short duration when for other reasons). o If current dressing causes regression in wound condition, may D/C ordered dressing product/s and apply Normal Saline Moist Dressing daily until next Wound Healing Center / Other MD appointment. Notify Wound Healing Center of regression in wound condition at [phone]. o Please direct any NON-WOUND related issues/requests for orders to patient's Primary Care Physician Wound #11 Left,Dorsal Foot o Initiate Home Health for Skilled Nursing o Home Health Nurse may visit PRN to address patientos wound care needs. o FACE  TO FACE ENCOUNTER: MEDICARE and MEDICAID PATIENTS: I certify that this patient is under my care and that I had a face-to-face encounter that meets the physician face-to-face encounter requirements with this patient on this date. The encounter with the patient was in whole or in part for the following MEDICAL CONDITION: (primary reason for Home Healthcare) MEDICAL NECESSITY: I certify, that based on my findings, NURSING services are a medically necessary home health service. HOME BOUND STATUS: I certify that my clinical findings support that this patient is homebound (i.e., Due to illness or injury, pt requires aid of supportive devices such as crutches, cane, wheelchairs, walkers, the use of special transportation or the assistance of another person to leave their place of residence. There is a normal inability to leave the home and doing so requires considerable and taxing effort. Other absences are for medical reasons / religious services and are infrequent or of short duration when for other reasons). o If current dressing causes regression in wound condition, may D/C ordered dressing product/s and apply Normal Saline Moist Dressing daily until next Wound Healing Center / Other MD appointment. Notify Wound Healing Center of regression in wound condition at [phone]. o Please direct any NON-WOUND related issues/requests for orders to patient's Primary Care Physician Wound #12 Right Knee o Initiate Home Health for Skilled Nursing o Home Health Nurse may visit PRN to address patientos wound care needs. o FACE TO FACE ENCOUNTER: MEDICARE and MEDICAID PATIENTS: I certify that this patient is under my care and that I had a face-to-face encounter that meets the physician face-to-face encounter requirements with this patient on this date. The encounter with the patient was in whole or in part for the following  MEDICAL CONDITION: (primary reason for Home Healthcare) MEDICAL NECESSITY: I  certify, that based on my findings, NURSING services are a medically necessary home health service. HOME BOUND STATUS: I certify that my clinical findings support that this patient is homebound (i.e., Due to illness or injury, pt requires aid of supportive devices such as crutches, cane, wheelchairs, walkers, the use of special transportation or the assistance of another person to leave their place of residence. There is a normal inability to leave the home [name], [name] ([id number]) and doing so requires considerable and taxing effort. Other absences are for medical reasons / religious services and are infrequent or of short duration when for other reasons). o If current dressing causes regression in wound condition, may D/C ordered dressing product/s and apply Normal Saline Moist Dressing daily until next Wound Healing Center / Other MD appointment. Notify Wound Healing Center of regression in wound condition at [phone]. o Please direct any NON-WOUND related issues/requests for orders to patient's Primary Care Physician Wound #5 Left,Medial Malleolus o Initiate Home Health for Skilled Nursing o Home Health Nurse may visit PRN to address patientos wound care needs. o FACE TO FACE ENCOUNTER: MEDICARE and MEDICAID PATIENTS: I certify that this patient is under my care and that I had a face-to-face encounter that meets the physician face-to-face encounter requirements with this patient on this date. The encounter with the patient was in whole or in part for the following MEDICAL CONDITION: (primary reason for Home Healthcare) MEDICAL NECESSITY: I certify, that based on my findings, NURSING services are a medically necessary home health service. HOME BOUND STATUS: I certify that my clinical findings support that this patient is homebound (i.e., Due to illness or injury, pt requires aid of supportive devices such as crutches, cane, wheelchairs, walkers, the use of special  transportation or the assistance of another person to leave their place of residence. There is a normal inability to leave the home and doing so requires considerable and taxing effort. Other absences are for medical reasons / religious services and are infrequent or of short duration when for other reasons). o If current dressing causes regression in wound condition, may D/C ordered dressing product/s and apply Normal Saline Moist Dressing daily until next Wound Healing Center / Other MD appointment. Notify Wound Healing Center of regression in wound condition at [phone]. o Please direct any NON-WOUND related issues/requests for orders to patient's Primary Care Physician Wound #7 Right,Anterior Lower Leg o Initiate Home Health for Skilled Nursing o Home Health Nurse may visit PRN to address patientos wound care needs. o FACE TO FACE ENCOUNTER: MEDICARE and MEDICAID PATIENTS: I certify that this patient is under my care and that I had a face-to-face encounter that meets the physician face-to-face encounter requirements with this patient on this date. The encounter with the patient was in whole or in part for the following MEDICAL CONDITION: (primary reason for Home Healthcare) MEDICAL NECESSITY: I certify, that based on my findings, NURSING services are a medically necessary home health service. HOME BOUND STATUS: I certify that my clinical findings support that this patient is homebound (i.e., Due to illness or injury, pt requires aid of supportive devices such as crutches, cane, wheelchairs, walkers, the use of special transportation or the assistance of another person to leave their place of residence. There is a normal inability to leave the home and doing so requires considerable and taxing effort. Other absences are for medical reasons / religious services and are infrequent or of short  duration when for other reasons). o If current dressing causes regression in wound  condition, may D/C ordered dressing product/s and apply Normal Saline Moist Dressing daily until next Wound Healing Center / Other MD appointment. Notify Wound Healing Center of regression in wound condition at [phone]. o Please direct any NON-WOUND related issues/requests for orders to patient's Primary Care Physician Electronic Signature(s) Signed: 12/02/2017 5:06:18 PM By: Elliot Gurney, [name], RN, CWS, [name], [name] Signed: 12/03/2017 8:15:41 AM By: Baltazar Najjar MD Entered By: Elliot Gurney, [name], RN, CWS, Kim on 12/02/2017 16:22:26 [name] ([id number]) -------------------------------------------------------------------------------- Problem List Details Patient Name: [name], [name]. Date of Service: 12/02/2017 3:45 PM Medical Record Number: [id number] Patient Account Number: [id number] Date of Birth/Sex: 03-29-1929 (84 y.o. M) Treating RN: Huel Coventry Primary Care Provider: Guerry Bruin Other Clinician: Referring Provider: Guerry Bruin Treating Provider/Extender: Altamese [place] in Treatment: 3 Active Problems ICD-10 Impacting Encounter Code Description Active Date Wound Healing Diagnosis L97.211 Non-pressure chronic ulcer of right calf limited to breakdown 11/11/2017 Yes of skin L97.221 Non-pressure chronic ulcer of left calf limited to breakdown of 11/11/2017 Yes skin I87.333 Chronic venous hypertension (idiopathic) with ulcer and 11/11/2017 Yes inflammation of bilateral lower extremity Inactive Problems Resolved Problems Electronic Signature(s) Signed: 12/03/2017 8:15:41 AM By: Baltazar Najjar MD Entered By: Baltazar Najjar on 12/03/2017 08:06:01 [name] ([id number]) -------------------------------------------------------------------------------- Progress Note Details Patient Name: Donald Musca C. Date of Service: 12/02/2017 3:45 PM Medical Record Number: [id number] Patient Account Number: [id number] Date of Birth/Sex: 07-15-1929 (84 y.o.  M) Treating RN: Huel Coventry Primary Care Provider: Guerry Bruin Other Clinician: Referring Provider: Guerry Bruin Treating Provider/Extender: Altamese [place] in Treatment: 3 Subjective History of Present Illness (HPI) Pleasant 84 year old with history of chronic venous insufficiency, remote history of DVT, atrial fibrillation (on Coumadin). No history of diabetes. He says that he developed worsening left lower extremity swelling, blisters, and subsequent ulcerations in late October 2016. He was started on Augmentin for left lower extremity cellulitis, which has improved. Arterial ultrasound 07/27/2015 showed no significant peripheral arterial disease. Awaiting venous ultrasound. Performing dressing changes with silver alginate and using a Tubigrip for edema control. He denies any significant pain. No claudication or rest pain. Ambulating per his baseline with a walker. No fever or chills. Moderate drainage. 10/10/15; the patient arrives back today having not been seen in almost 2 months. He has no open wounds on his legs. He has a history of venous insufficiency, arterial insufficiency. He has chronic Coumadin use secondary to atrial fibrillation and a remote history of DVT. Fortunately as he has not been here in 2 months we were not able to order him stockings. He is not able to get on standard compression hose. Juxtalite stockings would cost him probably about $100 appear, the couple was not able to afford this. 11/11/17 READMISSION This is an 84 year old man who is not a diabetic. He does have a history of sick sinus syndrome atrial fibrillation and has a cardiac pacemaker and is on Coumadin.he also has a history of DVT. His wife states that episodically over the last 2 years he has had wounds on his lower extremities but they have healed with home measures. Roughly 2-3 weeks ago the legs became a lot more swollen and he has had draining areas on the right anterior medial and  lateral and the left medial leg. The patient's wife states that they were told to put support stockings on him but they are concerned because they worsen the blisters. this bit  difficult to get the history but they think the edema has been increasing over the last several months. 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 He does not complain of chest pain orthopnea. He does not have a history of kidney issues that he is aware. Primary doctor at [facility]. He is not on diuretics. He was here 2 years ago. He was noted to have edema in his legs at that time. He had an ultrasound of his legs which I'll need to review. There was discussion about compression stockings but he is complaining clearly not using them. ABI in the right in this clinic was 1.37, 1.07 on the left 11/18/17; this is an 84 year old man who has bilateral small lower extremity wounds in the setting of very significant multifactorial edema. He probably has chronic venous insufficiency with lymphedema but it is clearly fluid overloaded last week. He apparently has started on diuretics through his primary physician at [facility] but the patient's wife is not sure which one but states that he goes to the bathroom every 15 minutes. He is tolerating compression wraps. His wife stated that they charged him full price stating that he didn't have open wounds on his legs that would cover wraparound stockings. His edema is much better controlled 11/25/17-he is here in follow-up evaluation for bilateral lower extremity ulcers. He is tolerating compression therapy. There was one episode since last appointment with copious amounts of drainage from the right medial malleolus. We will add additional absorptive dressing, otherwise no change in treatment plan and he will follow-up next week 12/02/17; since I last saw this man is edema control is a lot better and I think this is a combination  of diuretic therapy and are 3 layer compression. He has severe bilateral venous insufficiency with hemosiderin deposition and likely chronic stasis dermatitis/inflammation. In spite of the edema control he has several weeping areas on both legs including the left lateral leg, area on the right posterior leg. He also has an excoriated area just below his right tibial tuberosity. Fortuitously the patient has a follow-up with cardiology tomorrow [name], [name] ([id number]) Objective Constitutional Sitting or standing Blood Pressure is within target range for patient.. Pulse regular and within target range for patient.Marland Kitchen Respirations regular, non-labored and within target range.. Temperature is normal and within the target range for the patient.Marland Kitchen appears in no distress,frail elderly gentleman. Vitals Time Taken: 3:53 PM, Height: 68 in, Weight: 150 lbs, BMI: 22.8, Pulse: 101 bpm, Respiratory Rate: 16 breaths/min, Blood Pressure: 106/84 mmHg. Respiratory Respiratory effort is easy and symmetric bilaterally. Rate is normal at rest and on room air.. Bilateral breath sounds are clear and equal in all lobes with no wheezes, rales or rhonchi.. Cardiovascular A. fib. No S3. JVP is still elevated to the angle of the jaw. Pedal pulses palpable but clearly reduced. Edema present in both extremities.however this is much better than I remember from 2 weeks ago. Lymphatic none palpable in the popliteal or inguinal area. Psychiatric No evidence of depression, anxiety, or agitation. Calm, cooperative, and communicative. Appropriate interactions and affect.. General Notes: wound exam; There are bilateral weeping edema fluid areas on both legs. These areas are by themselves not wide open however they are certainly not healed. Most problematic lead the left posterior lateral leg, areas on the right posterior leg. His edema control is much better Integumentary (Hair, Skin) other than venous stasis changes in  his lower legs I see  no other systemic skin issues. Wound #10 status is Open. Original cause of wound was Gradually Appeared. The wound is located on the Right,Medial Malleolus. The wound measures 2.3cm length x 1cm width x 0.1cm depth; 1.806cm^2 area and 0.181cm^3 volume. There is no tunneling or undermining noted. There is a large amount of serous drainage noted. The wound margin is distinct with the outline attached to the wound base. There is medium (34-66%) red granulation within the wound bed. There is a medium (34- 66%) amount of necrotic tissue within the wound bed including Adherent Slough. The periwound skin appearance exhibited: Maceration. Periwound temperature was noted as No Abnormality. The periwound has tenderness on palpation. Wound #11 status is Open. Original cause of wound was Gradually Appeared. The wound is located on the Left,Dorsal Foot. The wound measures 1.6cm length x 0.2cm width x 0.1cm depth; 0.251cm^2 area and 0.025cm^3 volume. There is no tunneling or undermining noted. There is a large amount of serous drainage noted. The wound margin is flat and intact. There is large (67-100%) red granulation within the wound bed. There is a small (1-33%) amount of necrotic tissue within the wound bed including Adherent Slough. Periwound temperature was noted as No Abnormality. The periwound has tenderness on palpation. Wound #12 status is Open. Original cause of wound was Gradually Appeared. The wound is located on the Right Knee. The wound measures 1.2cm length x 0.9cm width x 0.1cm depth; 0.848cm^2 area and 0.085cm^3 volume. There is no tunneling or undermining noted. There is a none present amount of drainage noted. The wound margin is distinct with the outline attached to the wound base. There is no granulation within the wound bed. There is a large (67-100%) amount of necrotic tissue within the wound bed including Eschar and Adherent Slough. Periwound temperature was noted as  No Abnormality. The periwound has tenderness on palpation. [name], [name] Kitchen ([id number]) Wound #5 status is Open. Original cause of wound was Blister. The wound is located on the Left,Medial Malleolus. The wound measures 0.9cm length x 1cm width x 0.1cm depth; 0.707cm^2 area and 0.071cm^3 volume. There is Fat Layer (Subcutaneous Tissue) Exposed exposed. There is no tunneling or undermining noted. There is a large amount of serosanguineous drainage noted. The wound margin is flat and intact. There is medium (34-66%) red granulation within the wound bed. There is a medium (34-66%) amount of necrotic tissue within the wound bed including Adherent Slough. The periwound skin appearance exhibited: Erythema. The surrounding wound skin color is noted with erythema which is circumferential. Periwound temperature was noted as No Abnormality. The periwound has tenderness on palpation. Wound #6 status is Healed - Epithelialized. Original cause of wound was Blister. The wound is located on the Right,Medial Lower Leg. The wound measures 0cm length x 0cm width x 0cm depth; 0cm^2 area and 0cm^3 volume. There is Fat Layer (Subcutaneous Tissue) Exposed exposed. There is no tunneling or undermining noted. There is a none present amount of drainage noted. The wound margin is flat and intact. There is no granulation within the wound bed. There is no necrotic tissue within the wound bed. The periwound skin appearance exhibited: Erythema. The periwound skin appearance did not exhibit: Callus, Crepitus, Excoriation, Induration, Rash, Scarring, Dry/Scaly, Maceration, Atrophie Blanche, Cyanosis, Ecchymosis, Hemosiderin Staining, Mottled, Pallor, Rubor. The surrounding wound skin color is noted with erythema which is circumferential. Periwound temperature was noted as No Abnormality. The periwound has tenderness on palpation. Wound #7 status is Open. Original cause of wound was Gradually Appeared. The  wound is located on  the Right,Anterior Lower Leg. The wound measures 0.1cm length x 0.1cm width x 0.1cm depth; 0.008cm^2 area and 0.001cm^3 volume. There is no tunneling or undermining noted. There is a none present amount of drainage noted. The wound margin is flat and intact. There is no granulation within the wound bed. There is a large (67-100%) amount of necrotic tissue within the wound bed including Eschar. The periwound skin appearance did not exhibit: Callus, Crepitus, Excoriation, Induration, Rash, Scarring, Dry/Scaly, Maceration, Atrophie Blanche, Cyanosis, Ecchymosis, Hemosiderin Staining, Mottled, Pallor, Rubor, Erythema. Periwound temperature was noted as No Abnormality. The periwound has tenderness on palpation. Wound #8 status is Open. Original cause of wound was Blister. The wound is located on the Right,Lateral Lower Leg. The wound measures 0cm length x 0cm width x 0cm depth; 0cm^2 area and 0cm^3 volume. There is no tunneling or undermining noted. There is a none present amount of drainage noted. The wound margin is flat and intact. There is no granulation within the wound bed. There is no necrotic tissue within the wound bed. Periwound temperature was noted as No Abnormality. The periwound has tenderness on palpation. Assessment Active Problems ICD-10 L97.211 - Non-pressure chronic ulcer of right calf limited to breakdown of skin L97.221 - Non-pressure chronic ulcer of left calf limited to breakdown of skin I87.333 - Chronic venous hypertension (idiopathic) with ulcer and inflammation of bilateral lower extremity Procedures Wound #10 Pre-procedure diagnosis of Wound #10 is a Venous Leg Ulcer located on the Right,Medial Malleolus . There was a Three Layer Compression Therapy Procedure with a pre-treatment ABI of 1.3 by Huel Coventry, RN. Post procedure Diagnosis Wound #10: Same as Pre-Procedure Wound #5 Protzman, Jaeceon C. ([id number]) Pre-procedure diagnosis of Wound #5 is a Venous Leg Ulcer  located on the Left,Medial Malleolus . There was a Three Layer Compression Therapy Procedure with a pre-treatment ABI of 1.3 by Huel Coventry, RN. Post procedure Diagnosis Wound #5: Same as Pre-Procedure Plan Wound Cleansing: Wound #10 Right,Medial Malleolus: Cleanse wound with mild soap and water - HHRN please wash legs Wound #11 Left,Dorsal Foot: Cleanse wound with mild soap and water - HHRN please wash legs Wound #12 Right Knee: Cleanse wound with mild soap and water - HHRN please wash legs Wound #5 Left,Medial Malleolus: Cleanse wound with mild soap and water - HHRN please wash legs Wound #7 Right,Anterior Lower Leg: Cleanse wound with mild soap and water - HHRN please wash legs Anesthetic (add to Medication List): Wound #10 Right,Medial Malleolus: Topical Lidocaine 4% cream applied to wound bed prior to debridement (In Clinic Only). Wound #11 Left,Dorsal Foot: Topical Lidocaine 4% cream applied to wound bed prior to debridement (In Clinic Only). Wound #12 Right Knee: Topical Lidocaine 4% cream applied to wound bed prior to debridement (In Clinic Only). Wound #5 Left,Medial Malleolus: Topical Lidocaine 4% cream applied to wound bed prior to debridement (In Clinic Only). Wound #7 Right,Anterior Lower Leg: Topical Lidocaine 4% cream applied to wound bed prior to debridement (In Clinic Only). Skin Barriers/Peri-Wound Care: Wound #10 Right,Medial Malleolus: Moisturizing lotion - HHRN to moisturize Other: - OTC Lotrimin Cream on top of right foot. Wound #11 Left,Dorsal Foot: Moisturizing lotion - HHRN to moisturize Other: - OTC Lotrimin Cream on top of right foot. Wound #12 Right Knee: Moisturizing lotion - HHRN to moisturize Other: - OTC Lotrimin Cream on top of right foot. Wound #5 Left,Medial Malleolus: Moisturizing lotion - HHRN to moisturize Other: - OTC Lotrimin Cream on top of right foot. Wound #  7 Right,Anterior Lower Leg: Moisturizing lotion - HHRN to moisturize Other: -  OTC Lotrimin Cream on top of right foot. Primary Wound Dressing: Wound #10 Right,Medial Malleolus: Silver Alginate Wound #11 Left,Dorsal Foot: Silver Alginate Wound #12 Right Knee: Silver Alginate Wound #5 Left,Medial Malleolus: Silver Alginate Wound #7 Right,Anterior Lower Leg: [name], [name] ([id number]) Silver Alginate Secondary Dressing: Wound #10 Right,Medial Malleolus: ABD pad Drawtex Wound #11 Left,Dorsal Foot: ABD pad Drawtex Wound #12 Right Knee: ABD pad Drawtex Wound #5 Left,Medial Malleolus: ABD pad Drawtex Wound #7 Right,Anterior Lower Leg: ABD pad Drawtex Dressing Change Frequency: Wound #10 Right,Medial Malleolus: Change Dressing Monday, Wednesday, Friday Wound #11 Left,Dorsal Foot: Change Dressing Monday, Wednesday, Friday Wound #12 Right Knee: Change Dressing Monday, Wednesday, Friday Wound #5 Left,Medial Malleolus: Change Dressing Monday, Wednesday, Friday Wound #7 Right,Anterior Lower Leg: Change Dressing Monday, Wednesday, Friday Follow-up Appointments: Wound #10 Right,Medial Malleolus: Return Appointment in 1 week. Wound #11 Left,Dorsal Foot: Return Appointment in 1 week. Wound #12 Right Knee: Return Appointment in 1 week. Wound #5 Left,Medial Malleolus: Return Appointment in 1 week. Wound #7 Right,Anterior Lower Leg: Return Appointment in 1 week. Edema Control: Wound #10 Right,Medial Malleolus: 3 Layer Compression System - Bilateral Elevate legs to the level of the heart and pump ankles as often as possible Wound #11 Left,Dorsal Foot: 3 Layer Compression System - Bilateral Elevate legs to the level of the heart and pump ankles as often as possible Wound #12 Right Knee: 3 Layer Compression System - Bilateral Elevate legs to the level of the heart and pump ankles as often as possible Wound #5 Left,Medial Malleolus: 3 Layer Compression System - Bilateral Elevate legs to the level of the heart and pump ankles as often as  possible Wound #7 Right,Anterior Lower Leg: 3 Layer Compression System - Bilateral Elevate legs to the level of the heart and pump ankles as often as possible Additional Orders / Instructions: Wound #10 Right,Medial Malleolus: Vitamin A; Vitamin C, Zinc Activity as tolerated Wound #11 Left,Dorsal Foot: [name], [name] ([id number]) Vitamin A; Vitamin C, Zinc Activity as tolerated Wound #12 Right Knee: Vitamin A; Vitamin C, Zinc Activity as tolerated Wound #5 Left,Medial Malleolus: Vitamin A; Vitamin C, Zinc Activity as tolerated Wound #7 Right,Anterior Lower Leg: Vitamin A; Vitamin C, Zinc Activity as tolerated Home Health: Wound #10 Right,Medial Malleolus: Initiate Home Health for Skilled Nursing Home Health Nurse may visit PRN to address patient s wound care needs. FACE TO FACE ENCOUNTER: MEDICARE and MEDICAID PATIENTS: I certify that this patient is under my care and that I had a face-to-face encounter that meets the physician face-to-face encounter requirements with this patient on this date. The encounter with the patient was in whole or in part for the following MEDICAL CONDITION: (primary reason for Home Healthcare) MEDICAL NECESSITY: I certify, that based on my findings, NURSING services are a medically necessary home health service. HOME BOUND STATUS: I certify that my clinical findings support that this patient is homebound (i.e., Due to illness or injury, pt requires aid of supportive devices such as crutches, cane, wheelchairs, walkers, the use of special transportation or the assistance of another person to leave their place of residence. There is a normal inability to leave the home and doing so requires considerable and taxing effort. Other absences are for medical reasons / religious services and are infrequent or of short duration when for other reasons). If current dressing causes regression in wound condition, may D/C ordered dressing product/s and apply Normal  Saline Moist Dressing  daily until next Wound Healing Center / Other MD appointment. Notify Wound Healing Center of regression in wound condition at [phone]. Please direct any NON-WOUND related issues/requests for orders to patient's Primary Care Physician Wound #11 Left,Dorsal Foot: Initiate Home Health for Skilled Nursing Home Health Nurse may visit PRN to address patient s wound care needs. FACE TO FACE ENCOUNTER: MEDICARE and MEDICAID PATIENTS: I certify that this patient is under my care and that I had a face-to-face encounter that meets the physician face-to-face encounter requirements with this patient on this date. The encounter with the patient was in whole or in part for the following MEDICAL CONDITION: (primary reason for Home Healthcare) MEDICAL NECESSITY: I certify, that based on my findings, NURSING services are a medically necessary home health service. HOME BOUND STATUS: I certify that my clinical findings support that this patient is homebound (i.e., Due to illness or injury, pt requires aid of supportive devices such as crutches, cane, wheelchairs, walkers, the use of special transportation or the assistance of another person to leave their place of residence. There is a normal inability to leave the home and doing so requires considerable and taxing effort. Other absences are for medical reasons / religious services and are infrequent or of short duration when for other reasons). If current dressing causes regression in wound condition, may D/C ordered dressing product/s and apply Normal Saline Moist Dressing daily until next Wound Healing Center / Other MD appointment. Notify Wound Healing Center of regression in wound condition at [phone]. Please direct any NON-WOUND related issues/requests for orders to patient's Primary Care Physician Wound #12 Right Knee: Initiate Home Health for Skilled Nursing Home Health Nurse may visit PRN to address patient s wound care  needs. FACE TO FACE ENCOUNTER: MEDICARE and MEDICAID PATIENTS: I certify that this patient is under my care and that I had a face-to-face encounter that meets the physician face-to-face encounter requirements with this patient on this date. The encounter with the patient was in whole or in part for the following MEDICAL CONDITION: (primary reason for Home Healthcare) MEDICAL NECESSITY: I certify, that based on my findings, NURSING services are a medically necessary home health service. HOME BOUND STATUS: I certify that my clinical findings support that this patient is homebound (i.e., Due to illness or injury, pt requires aid of supportive devices such as crutches, cane, wheelchairs, walkers, the use of special transportation or the assistance of another person to leave their place of residence. There is a normal inability to leave the home and doing so requires considerable and taxing effort. Other absences are for medical reasons / religious services and are infrequent or of short duration when for other reasons). If current dressing causes regression in wound condition, may D/C ordered dressing product/s and apply Normal Saline Moist Dressing daily until next Wound Healing Center / Other MD appointment. Notify Wound Healing Center of regression in wound condition at [phone]. Please direct any NON-WOUND related issues/requests for orders to patient's Primary Care Physician [name], [name] ([id number]) Wound #5 Left,Medial Malleolus: Initiate Home Health for Skilled Nursing Home Health Nurse may visit PRN to address patient s wound care needs. FACE TO FACE ENCOUNTER: MEDICARE and MEDICAID PATIENTS: I certify that this patient is under my care and that I had a face-to-face encounter that meets the physician face-to-face encounter requirements with this patient on this date. The encounter with the patient was in whole or in part for the following MEDICAL CONDITION: (primary reason for  Home Healthcare) MEDICAL  NECESSITY: I certify, that based on my findings, NURSING services are a medically necessary home health service. HOME BOUND STATUS: I certify that my clinical findings support that this patient is homebound (i.e., Due to illness or injury, pt requires aid of supportive devices such as crutches, cane, wheelchairs, walkers, the use of special transportation or the assistance of another person to leave their place of residence. There is a normal inability to leave the home and doing so requires considerable and taxing effort. Other absences are for medical reasons / religious services and are infrequent or of short duration when for other reasons). If current dressing causes regression in wound condition, may D/C ordered dressing product/s and apply Normal Saline Moist Dressing daily until next Wound Healing Center / Other MD appointment. Notify Wound Healing Center of regression in wound condition at [phone]. Please direct any NON-WOUND related issues/requests for orders to patient's Primary Care Physician Wound #7 Right,Anterior Lower Leg: Initiate Home Health for Skilled Nursing Home Health Nurse may visit PRN to address patient s wound care needs. FACE TO FACE ENCOUNTER: MEDICARE and MEDICAID PATIENTS: I certify that this patient is under my care and that I had a face-to-face encounter that meets the physician face-to-face encounter requirements with this patient on this date. The encounter with the patient was in whole or in part for the following MEDICAL CONDITION: (primary reason for Home Healthcare) MEDICAL NECESSITY: I certify, that based on my findings, NURSING services are a medically necessary home health service. HOME BOUND STATUS: I certify that my clinical findings support that this patient is homebound (i.e., Due to illness or injury, pt requires aid of supportive devices such as crutches, cane, wheelchairs, walkers, the use of special transportation or  the assistance of another person to leave their place of residence. There is a normal inability to leave the home and doing so requires considerable and taxing effort. Other absences are for medical reasons / religious services and are infrequent or of short duration when for other reasons). If current dressing causes regression in wound condition, may D/C ordered dressing product/s and apply Normal Saline Moist Dressing daily until next Wound Healing Center / Other MD appointment. Notify Wound Healing Center of regression in wound condition at [phone]. Please direct any NON-WOUND related issues/requests for orders to patient's Primary Care Physician #1 there is no option but to continue with the silver alginate and secondary absorptive dressings under 3 L compression #2 the patient complains of ankle pain episodically leg pain. I am very concerned about putting a 4-layer compression on him given these complaints. We may need to look at formal arterial studies if he hasn't already had these done #3 he is seeing cardiology today hopefully they'll address his volume status although this does appear a lot better than when I first saw him Electronic Signature(s) Signed: 12/03/2017 8:15:41 AM By: Baltazar Najjar MD Entered By: Baltazar Najjar on 12/03/2017 08:12:28 [name] ([id number]) -------------------------------------------------------------------------------- SuperBill Details Patient Name: Donald Musca C. Date of Service: 12/02/2017 Medical Record Number: [id number] Patient Account Number: [id number] Date of Birth/Sex: Jan 30, 1929 (84 y.o. M) Treating RN: Huel Coventry Primary Care Provider: Guerry Bruin Other Clinician: Referring Provider: Guerry Bruin Treating Provider/Extender: Altamese [place] in Treatment: 3 Diagnosis Coding ICD-10 Codes Code Description [phone] Non-pressure chronic ulcer of right calf limited to breakdown of skin L97.221 Non-pressure  chronic ulcer of left calf limited to breakdown of skin I87.333 Chronic venous hypertension (idiopathic) with ulcer and inflammation of bilateral lower extremity Facility  Procedures CPT4: Description Modifier Quantity Code [id number] BILATERAL: Application of multi-layer venous compression system; leg (below 1 knee), including ankle and foot. Physician Procedures CPT4 Code: [id number] Description: 99213 - WC PHYS LEVEL 3 - EST PT ICD-10 Diagnosis Description L97.211 Non-pressure chronic ulcer of right calf limited to breakdo L97.221 Non-pressure chronic ulcer of left calf limited to breakdow Modifier: wn of skin n of skin Quantity: 1 Electronic Signature(s) Signed: 12/03/2017 8:54:12 AM By: Elliot GurneyWoody, [name], RN, CWS, [name], [name] Previous Signature: 12/03/2017 8:15:41 AM Version By: Baltazar Najjarobson, Michael MD Entered By: Elliot GurneyWoody, [name], RN, CWS, Kim on 12/03/2017 08:54:11

## 2017-12-04 NOTE — Progress Notes (Signed)
JACQUAN, SAVAS ([id number]) Visit Report for 12/02/2017 Arrival Information Details Patient Name: [name], [name]. Date of Service: 12/02/2017 3:45 PM Medical Record Number: [id number] Patient Account Number: [id number] Date of Birth/Sex: 03/11/1929 (84 y.o. M) Treating RN: Phillis Haggis Primary Care [name] Other Clinician: Referring Ceylin Dreibelbis: Guerry Potts Treating Nazair Fortenberry/Extender: Altamese [place] in Treatment: 3 Visit Information History Since Last Visit All ordered tests and consults were completed: No Patient Arrived: Wheel Chair Added or deleted any medications: No Arrival Time: 15:50 Any new allergies or adverse reactions: No Accompanied By: wife Had a fall or experienced change in No Transfer Assistance: EasyPivot Patient activities of daily living that may affect Lift risk of falls: Patient Identification Verified: Yes Signs or symptoms of abuse/neglect since last visito No Secondary Verification Process Yes Hospitalized since last visit: No Completed: Implantable device outside of the clinic excluding No Patient Requires Transmission-Based No cellular tissue based products placed in the center Precautions: since last visit: Patient Has Alerts: Yes Has Dressing in Place as Prescribed: Yes Patient Alerts: Patient on Blood Has Compression in Place as Prescribed: Yes Thinner Warfarin Pain Present Now: No Pacemaker Electronic Signature(s) Signed: 12/02/2017 4:37:47 PM By: Alejandro Mulling Entered By: Alejandro Mulling on 12/02/2017 15:53:31 Cloninger, [name] ([id number]) -------------------------------------------------------------------------------- Compression Therapy Details Patient Name: Donald Musca C. Date of Service: 12/02/2017 3:45 PM Medical Record Number: [id number] Patient Account Number: [id number] Date of Birth/Sex: 10/27/1928 (84 y.o. M) Treating RN: Huel Coventry Primary Care Abigail Teall: Guerry Potts Other  Clinician: Referring Scottie Stanish: Guerry Potts Treating Arien Morine/Extender: Altamese [place] in Treatment: 3 Compression Therapy Performed for Wound Assessment: Wound #10 Right,Medial Malleolus Performed By: Clinician Huel Coventry, RN Compression Type: Three Layer Pre Treatment ABI: 1.3 Post Procedure Diagnosis Same as Pre-procedure Electronic Signature(s) Signed: 12/02/2017 5:06:18 PM By: Elliot Gurney, [name], RN, CWS, [name], [name] Entered By: Elliot Gurney, [name], RN, CWS, Kim on 12/02/2017 16:23:01 [name] ([id number]) -------------------------------------------------------------------------------- Compression Therapy Details Patient Name: Donald Musca C. Date of Service: 12/02/2017 3:45 PM Medical Record Number: [id number] Patient Account Number: [id number] Date of Birth/Sex: Dec 08, 1928 (84 y.o. M) Treating RN: Huel Coventry Primary Care Helon Wisinski: Guerry Potts Other Clinician: Referring Jaylen Knope: Guerry Potts Treating Tiawana Forgy/Extender: Altamese [place] in Treatment: 3 Compression Therapy Performed for Wound Assessment: Wound #5 Left,Medial Malleolus Performed By: Clinician Huel Coventry, RN Compression Type: Three Layer Pre Treatment ABI: 1.3 Post Procedure Diagnosis Same as Pre-procedure Electronic Signature(s) Signed: 12/02/2017 5:06:18 PM By: Elliot Gurney, [name], RN, CWS, [name], [name] Entered By: Elliot Gurney, [name], RN, CWS, Kim on 12/02/2017 16:23:01 [name] ([id number]) -------------------------------------------------------------------------------- Encounter Discharge Information Details Patient Name: Donald Musca C. Date of Service: 12/02/2017 3:45 PM Medical Record Number: [id number] Patient Account Number: [id number] Date of Birth/Sex: Sep 01, 1929 (84 y.o. M) Treating RN: Huel Coventry Primary Care Jazzalynn Rhudy: Guerry Potts Other Clinician: Referring Avin Upperman: Guerry Potts Treating Thiago Ragsdale/Extender: Altamese [place] in Treatment: 3 Encounter Discharge  Information Items Discharge Pain Level: 0 Discharge Condition: Stable Ambulatory Status: Wheelchair Discharge Destination: Home Transportation: Private Auto Accompanied By: wife Schedule Follow-up Appointment: Yes Medication Reconciliation completed and No provided to Patient/Care Tyrena Gohr: Provided on Clinical Summary of Care: 12/02/2017 Form Type Recipient Paper Patient RW Electronic Signature(s) Signed: 12/02/2017 4:49:29 PM By: Donald Crigler Entered By: Donald Crigler on 12/02/2017 16:43:11 [name], [name] ([id number]) -------------------------------------------------------------------------------- Lower Extremity Assessment Details Patient Name: Donald Musca C. Date of Service: 12/02/2017 3:45 PM Medical Record Number: [id number] Patient Account Number: [id number] Date of Birth/Sex: 12/02/1928 (84 y.o. M) Treating  RN: Phillis HaggisPinkerton, Debi Primary Care Reyna Lorenzi: Guerry BruinISOVEC, RICHARD Other Clinician: Referring Tekelia Kareem: Guerry BruinISOVEC, RICHARD Treating Damary Doland/Extender: Altamese CarolinaOBSON, MICHAEL G Weeks in Treatment: 3 Edema Assessment Assessed: [Left: No] [Right: No] [Left: Edema] [Right: :] Calf Left: Right: Point of Measurement: 33 cm From Medial Instep 30.6 cm 30 cm Ankle Left: Right: Point of Measurement: 12 cm From Medial Instep 21.8 cm 21.5 cm Vascular Assessment Pulses: Dorsalis Pedis Palpable: [Left:Yes] [Right:Yes] Posterior Tibial Extremity colors, hair growth, and conditions: Extremity Color: [Left:Hyperpigmented] [Right:Hyperpigmented] Temperature of Extremity: [Left:Warm] [Right:Warm] Capillary Refill: [Left:< 3 seconds] [Right:< 3 seconds] Toe Nail Assessment Left: Right: Thick: Yes Yes Discolored: Yes Yes Deformed: Yes Yes Improper Length and Hygiene: Yes Yes Electronic Signature(s) Signed: 12/02/2017 4:37:47 PM By: Alejandro MullingPinkerton, Debra Entered By: Alejandro MullingPinkerton, Debra on 12/02/2017 16:01:59 [name], Donald HowellsALPH C.  ([id number]) -------------------------------------------------------------------------------- Multi Wound Chart Details Patient Name: Donald MuscaWILLIFORD, Donovyn C. Date of Service: 12/02/2017 3:45 PM Medical Record Number: [id number] Patient Account Number: [id number] Date of Birth/Sex: 03/14/1929 (84 y.o. M) Treating RN: Huel CoventryWoody, Kim Primary Care Tayshon Winker: Guerry BruinISOVEC, RICHARD Other Clinician: Referring Jamacia Jester: Guerry BruinISOVEC, RICHARD Treating Harshika Mago/Extender: Altamese CarolinaOBSON, MICHAEL G Weeks in Treatment: 3 Vital Signs Height(in): 68 Pulse(bpm): 101 Weight(lbs): 150 Blood Pressure(mmHg): 106/84 Body Mass Index(BMI): 23 Temperature(F): Respiratory Rate 16 (breaths/min): Photos: Wound Location: Right Malleolus - Medial Left Foot - Dorsal Right Knee Wounding Event: Gradually Appeared Gradually Appeared Gradually Appeared Primary Etiology: Venous Leg Ulcer To be determined To be determined Comorbid History: Cataracts, Arrhythmia, Deep Cataracts, Arrhythmia, Deep Cataracts, Arrhythmia, Deep Vein Thrombosis, Vein Thrombosis, Vein Thrombosis, Osteoarthritis Osteoarthritis Osteoarthritis Date Acquired: 12/02/2017 12/02/2017 12/02/2017 Weeks of Treatment: 0 0 0 Wound Status: Open Open Open Measurements L x W x D 2.3x1x0.1 1.6x0.2x0.1 1.2x0.9x0.1 (cm) Area (cm) : 1.806 0.251 0.848 Volume (cm) : 0.181 0.025 0.085 % Reduction in Area: N/A N/A N/A % Reduction in Volume: N/A N/A N/A Classification: Full Thickness Without Full Thickness Without Full Thickness Without Exposed Support Potts Exposed Support Potts Exposed Support Potts Exudate Amount: Large Large None Present Exudate Type: Serous Serous N/A Exudate Color: Media planneramber amber N/A Wound Margin: Distinct, outline attached Flat and Intact Distinct, outline attached Granulation Amount: Medium (34-66%) Large (67-100%) None Present (0%) Granulation Quality: Red Red N/A Necrotic Amount: Medium (34-66%) Small (1-33%) Large (67-100%) Necrotic Tissue:  Donald Slough Donald [name], Donald Slough Exposed Potts: Fascia: No Fascia: No Fascia: No Fat Layer (Subcutaneous Fat Layer (Subcutaneous Fat Layer (Subcutaneous Tissue) Exposed: No Tissue) Exposed: No Tissue) Exposed: No Tendon: No Tendon: No Tendon: No [place]ore, Hilton C. ([id number]) Muscle: No Muscle: No Muscle: No Joint: No Joint: No Joint: No Bone: No Bone: No Bone: No Epithelialization: None None None Periwound Skin Texture: No Abnormalities Noted No Abnormalities Noted No Abnormalities Noted Periwound Skin Moisture: Maceration: Yes No Abnormalities Noted No Abnormalities Noted Periwound Skin Color: No Abnormalities Noted No Abnormalities Noted No Abnormalities Noted Erythema Location: N/A N/A N/A Temperature: No Abnormality No Abnormality No Abnormality Tenderness on Palpation: Yes Yes Yes Wound Preparation: Ulcer Cleansing: Ulcer Cleansing: Ulcer Cleansing: Rinsed/Irrigated with Saline Rinsed/Irrigated with Saline Rinsed/Irrigated with Saline Topical Anesthetic Applied: Topical Anesthetic Applied: Topical Anesthetic Applied: None None Other: lidocaine 4% Procedures Performed: Compression Therapy N/A N/A Wound Number: [id number]  Photos: Wound Location: Left Malleolus - Medial Right Lower Leg - Medial Right Lower Leg - Anterior Wounding Event: Blister Blister Gradually Appeared Primary Etiology: Venous Leg Ulcer Venous Leg Ulcer Venous Leg Ulcer Comorbid History: Cataracts, Arrhythmia, Deep Cataracts, Arrhythmia, Deep Cataracts, Arrhythmia, Deep Vein Thrombosis, Vein Thrombosis, Vein Thrombosis, Osteoarthritis  Osteoarthritis Osteoarthritis Date Acquired: 10/28/2017 10/28/2017 10/28/2017 Weeks of Treatment: 3 3 3  Wound Status: Open Healed - Epithelialized Open Measurements L x W x D 0.9x1x0.1 0x0x0 0.1x0.1x0.1 (cm) Area (cm) : 0.707 0 0.008 Volume (cm) : 0.071 0 0.001 % Reduction in Area: -199.60% 100.00% 95.90% % Reduction in Volume: -195.80%  100.00% 95.00% Classification: Full Thickness Without Full Thickness Without Full Thickness Without Exposed Support Potts Exposed Support Potts Exposed Support Potts Exudate Amount: Large None Present None Present Exudate Type: Serosanguineous N/A N/A Exudate Color: red, brown N/A N/A Wound Margin: Flat and Intact Flat and Intact Flat and Intact Granulation Amount: Medium (34-66%) None Present (0%) None Present (0%) Granulation Quality: Red N/A N/A Necrotic Amount: Medium (34-66%) None Present (0%) Large (67-100%) Necrotic Tissue: Donald Slough N/A Potts Exposed Potts: Fat Layer (Subcutaneous Fat Layer (Subcutaneous Fascia: No Tissue) Exposed: Yes Tissue) Exposed: Yes Fat Layer (Subcutaneous Fascia: No Fascia: No Tissue) Exposed: No Tendon: No Tendon: No Tendon: No Muscle: No Muscle: No Muscle: No Espaillat, Bartosz C. ([id number]) Joint: No Joint: No Joint: No Bone: No Bone: No Bone: No Epithelialization: None Large (67-100%) None Periwound Skin Texture: No Abnormalities Noted Excoriation: No Excoriation: No Induration: No Induration: No Callus: No Callus: No Crepitus: No Crepitus: No Rash: No Rash: No Scarring: No Scarring: No Periwound Skin Moisture: No Abnormalities Noted Maceration: No Maceration: No Dry/Scaly: No Dry/Scaly: No Periwound Skin Color: Erythema: Yes Erythema: Yes Atrophie Blanche: No Atrophie Blanche: No Cyanosis: No Cyanosis: No Ecchymosis: No Ecchymosis: No Erythema: No Hemosiderin Staining: No Hemosiderin Staining: No Mottled: No Mottled: No Pallor: No Pallor: No Rubor: No Rubor: No Erythema Location: Circumferential Circumferential N/A Temperature: No Abnormality No Abnormality No Abnormality Tenderness on Palpation: Yes Yes Yes Wound Preparation: Ulcer Cleansing: Ulcer Cleansing: Ulcer Cleansing: Rinsed/Irrigated with Saline Rinsed/Irrigated with Saline Rinsed/Irrigated with Saline Topical  Anesthetic Applied: Topical Anesthetic Applied: Topical Anesthetic Applied: Other: lidocaine 4% None Other: lidocaine 4% Procedures Performed: Compression Therapy N/A N/A Wound Number: 8 N/A N/A Photos: N/A N/A Wound Location: Right Lower Leg - Lateral N/A N/A Wounding Event: Blister N/A N/A Primary Etiology: Venous Leg Ulcer N/A N/A Comorbid History: Cataracts, Arrhythmia, Deep N/A N/A Vein Thrombosis, Osteoarthritis Date Acquired: 10/28/2017 N/A N/A Weeks of Treatment: 3 N/A N/A Wound Status: Open N/A N/A Measurements L x W x D 0x0x0 N/A N/A (cm) Area (cm) : 0 N/A N/A Volume (cm) : 0 N/A N/A % Reduction in Area: 100.00% N/A N/A % Reduction in Volume: 100.00% N/A N/A Classification: Full Thickness Without N/A N/A Exposed Support Potts Exudate Amount: None Present N/A N/A Exudate Type: N/A N/A N/A [name], [name] ([id number]) Exudate Color: N/A N/A N/A Wound Margin: Flat and Intact N/A N/A Granulation Amount: None Present (0%) N/A N/A Granulation Quality: N/A N/A N/A Necrotic Amount: None Present (0%) N/A N/A Necrotic Tissue: N/A N/A N/A Exposed Potts: Fascia: No N/A N/A Fat Layer (Subcutaneous Tissue) Exposed: No Tendon: No Muscle: No Joint: No Bone: No Epithelialization: None N/A N/A Periwound Skin Texture: No Abnormalities Noted N/A N/A Periwound Skin Moisture: No Abnormalities Noted N/A N/A Periwound Skin Color: No Abnormalities Noted N/A N/A Erythema Location: N/A N/A N/A Temperature: No Abnormality N/A N/A Tenderness on Palpation: Yes N/A N/A Wound Preparation: Ulcer Cleansing: N/A N/A Rinsed/Irrigated with Saline Topical Anesthetic Applied: None Procedures Performed: N/A N/A N/A Treatment Notes Wound #10 (Right, Medial Malleolus) 1. Cleansed with: Clean wound with Normal Saline 2. Anesthetic Topical Lidocaine 4% cream to wound bed prior to debridement 4. Dressing Applied:  Other dressing (specify in notes) 7. Secured with 3 Layer  Compression System - Bilateral Notes silvercell, drawtex, abd, Wound #11 (Left, Dorsal Foot) 1. Cleansed with: Clean wound with Normal Saline 2. Anesthetic Topical Lidocaine 4% cream to wound bed prior to debridement 4. Dressing Applied: Other dressing (specify in notes) 7. Secured with 3 Layer Compression System - Bilateral Notes silvercell, drawtex, abd, Wound #12 (Right Knee) Vanderheiden, Copper C. ([id number]) 1. Cleansed with: Clean wound with Normal Saline 2. Anesthetic Topical Lidocaine 4% cream to wound bed prior to debridement 4. Dressing Applied: Other dressing (specify in notes) 7. Secured with 3 Layer Compression System - Bilateral Notes silvercell, drawtex, abd, Wound #5 (Left, Medial Malleolus) 1. Cleansed with: Clean wound with Normal Saline 2. Anesthetic Topical Lidocaine 4% cream to wound bed prior to debridement 4. Dressing Applied: Other dressing (specify in notes) 7. Secured with 3 Layer Compression System - Bilateral Notes silvercell, drawtex, abd, Wound #7 (Right, Anterior Lower Leg) 1. Cleansed with: Clean wound with Normal Saline 2. Anesthetic Topical Lidocaine 4% cream to wound bed prior to debridement 4. Dressing Applied: Other dressing (specify in notes) 7. Secured with 3 Layer Compression System - Bilateral Notes silvercell, drawtex, abd, Electronic Signature(s) Signed: 12/03/2017 8:15:41 AM By: Baltazar Najjar MD Previous Signature: 12/02/2017 5:06:18 PM Version By: Elliot Gurney, [name], RN, CWS, [name], [name] Entered By: Baltazar Najjar on 12/03/2017 08:06:38 [name] ([id number]) -------------------------------------------------------------------------------- Multi-Disciplinary Care Plan Details Patient Name: BING, DUFFEY. Date of Service: 12/02/2017 3:45 PM Medical Record Number: [id number] Patient Account Number: [id number] Date of Birth/Sex: 03-01-1929 (84 y.o. M) Treating RN: Huel Coventry Primary Care Mckynzie Liwanag: Guerry Potts  Other Clinician: Referring [place] Rosamond: Guerry Potts Treating Khaleel Beckom/Extender: Altamese [place] in Treatment: 3 Active Inactive ` Abuse / Safety / Falls / Self Care Management Nursing Diagnoses: Impaired physical mobility Potential for falls Goals: Patient will not develop complications from immobility Date Initiated: 11/11/2017 Target Resolution Date: 12/09/2017 Goal Status: Active Patient will remain injury free related to falls Date Initiated: 11/11/2017 Target Resolution Date: 12/09/2017 Goal Status: Active Interventions: Assess fall risk on admission and as needed Notes: ` Nutrition Nursing Diagnoses: Potential for alteratiion in Nutrition/Potential for imbalanced nutrition Goals: Patient/caregiver agrees to and verbalizes understanding of need to use nutritional supplements and/or vitamins as prescribed Date Initiated: 11/11/2017 Target Resolution Date: 12/10/2017 Goal Status: Active Interventions: Assess patient nutrition upon admission and as needed per policy Notes: ` Orientation to the Wound Care Program Nursing Diagnoses: Knowledge deficit related to the wound healing center program Goals: Patient/caregiver will verbalize understanding of the Wound Healing Center [address] ALAZAR, CHERIAN ([id number]) Date Initiated: 11/11/2017 Target Resolution Date: 12/10/2017 Goal Status: Active Interventions: Provide education on orientation to the wound center Notes: ` Venous Leg Ulcer Nursing Diagnoses: Actual venous Insuffiency (use after diagnosis is confirmed) Goals: Non-invasive venous studies are completed as ordered Date Initiated: 11/11/2017 Target Resolution Date: 12/10/2017 Goal Status: Active Interventions: Assess peripheral edema status every visit. Treatment Activities: Non-invasive vascular studies : 11/11/2017 Notes: ` Wound/Skin Impairment Nursing Diagnoses: Impaired tissue integrity Goals: Ulcer/skin breakdown will have a volume reduction of 80%  by week 12 Date Initiated: 11/11/2017 Target Resolution Date: 12/10/2017 Goal Status: Active Interventions: Assess patient/caregiver ability to obtain necessary supplies Provide education on ulcer and skin care Treatment Activities: Topical wound management initiated : 11/11/2017 Notes: Electronic Signature(s) Signed: 12/02/2017 5:06:18 PM By: Elliot Gurney, [name], RN, CWS, [name], [name] Entered By: Elliot Gurney, [name], RN, CWS, Kim on 12/02/2017 16:19:29 [name] ([id number]) --------------------------------------------------------------------------------  Pain Assessment Details Patient Name: MANSFIELD, DANN. Date of Service: 12/02/2017 3:45 PM Medical Record Number: [id number] Patient Account Number: [id number] Date of Birth/Sex: 1928/10/19 (84 y.o. M) Treating RN: Phillis Haggis Primary Care Jilleen Essner: Guerry Potts Other Clinician: Referring Brennin Durfee: Guerry Potts Treating Rashaan Wyles/Extender: Altamese [place] in Treatment: 3 Active Problems Location of Pain Severity and Description of Pain Patient Has Paino No Site Locations Pain Management and Medication Current Pain Management: Electronic Signature(s) Signed: 12/02/2017 4:37:47 PM By: Alejandro Mulling Entered By: Alejandro Mulling on 12/02/2017 15:53:40 [name] ([id number]) -------------------------------------------------------------------------------- Patient/Caregiver Education Details Patient Name: [name]. Date of Service: 12/02/2017 3:45 PM Medical Record Number: [id number] Patient Account Number: [id number] Date of Birth/Gender: 1928-10-02 (84 y.o. M) Treating RN: Donald Crigler Primary Care Physician: Guerry Potts Other Clinician: Referring Physician: Guerry Potts Treating Physician/Extender: Altamese [place] in Treatment: 3 Education Assessment Education Provided To: Patient Education Topics Provided Wound/Skin Impairment: Handouts: Caring for Your Ulcer Methods:  Explain/Verbal Responses: State content correctly Electronic Signature(s) Signed: 12/02/2017 4:49:29 PM By: Donald Crigler Entered By: Donald Crigler on 12/02/2017 16:43:22 [name], [name] ([id number]) -------------------------------------------------------------------------------- Wound Assessment Details Patient Name: Donald Musca C. Date of Service: 12/02/2017 3:45 PM Medical Record Number: [id number] Patient Account Number: [id number] Date of Birth/Sex: 1928/10/14 (84 y.o. M) Treating RN: Phillis Haggis Primary Care Shade Rivenbark: Guerry Potts Other Clinician: Referring Neema Fluegge: Guerry Potts Treating Foye Damron/Extender: Altamese [place] in Treatment: 3 Wound Status Wound Number: 10 Primary Venous Leg Ulcer Etiology: Wound Location: Right Malleolus - Medial Wound Status: Open Wounding Event: Gradually Appeared Comorbid Cataracts, Arrhythmia, Deep Vein Date Acquired: 12/02/2017 History: Thrombosis, Osteoarthritis Weeks Of Treatment: 0 Clustered Wound: No Photos Photo Uploaded By: Alejandro Mulling on 12/02/2017 16:18:24 Wound Measurements Length: (cm) 2.3 Width: (cm) 1 Depth: (cm) 0.1 Area: (cm) 1.806 Volume: (cm) 0.181 % Reduction in Area: % Reduction in Volume: Epithelialization: None Tunneling: No Undermining: No Wound Description Full Thickness Without Exposed Support Classification: Potts Wound Margin: Distinct, outline attached Exudate Large Amount: Exudate Type: Serous Exudate Color: amber Foul Odor After Cleansing: No Slough/Fibrino Yes Wound Bed Granulation Amount: Medium (34-66%) Exposed Structure Granulation Quality: Red Fascia Exposed: No Necrotic Amount: Medium (34-66%) Fat Layer (Subcutaneous Tissue) Exposed: No Necrotic Quality: Donald Slough Tendon Exposed: No Muscle Exposed: No Joint Exposed: No Bone Exposed: No Kassin, Rylin C. ([id number]) Periwound Skin Texture Texture Color No Abnormalities Noted:  No No Abnormalities Noted: No Moisture Temperature / Pain No Abnormalities Noted: No Temperature: No Abnormality Maceration: Yes Tenderness on Palpation: Yes Wound Preparation Ulcer Cleansing: Rinsed/Irrigated with Saline Topical Anesthetic Applied: None Treatment Notes Wound #10 (Right, Medial Malleolus) 1. Cleansed with: Clean wound with Normal Saline 2. Anesthetic Topical Lidocaine 4% cream to wound bed prior to debridement 4. Dressing Applied: Other dressing (specify in notes) 7. Secured with 3 Layer Compression System - Bilateral Notes silvercell, drawtex, abd, Electronic Signature(s) Signed: 12/02/2017 4:37:47 PM By: Alejandro Mulling Entered By: Alejandro Mulling on 12/02/2017 16:08:39 [name] ([id number]) -------------------------------------------------------------------------------- Wound Assessment Details Patient Name: Donald Musca C. Date of Service: 12/02/2017 3:45 PM Medical Record Number: [id number] Patient Account Number: [id number] Date of Birth/Sex: 1929/03/12 (84 y.o. M) Treating RN: Phillis Haggis Primary Care Maycel Riffe: Guerry Potts Other Clinician: Referring Ariya Bohannon: Guerry Potts Treating Amiera Herzberg/Extender: Altamese [place] in Treatment: 3 Wound Status Wound Number: 11 Primary To be determined Etiology: Wound Location: Left Foot - Dorsal Wound Status: Open Wounding Event: Gradually Appeared Comorbid Cataracts, Arrhythmia, Deep Vein Date Acquired: 12/02/2017 History: Thrombosis, Osteoarthritis  Weeks Of Treatment: 0 Clustered Wound: No Photos Photo Uploaded By: Alejandro Mulling on 12/02/2017 16:18:56 Wound Measurements Length: (cm) 1.6 Width: (cm) 0.2 Depth: (cm) 0.1 Area: (cm) 0.251 Volume: (cm) 0.025 % Reduction in Area: % Reduction in Volume: Epithelialization: None Tunneling: No Undermining: No Wound Description Full Thickness Without Exposed Support Classification: Potts Wound Margin: Flat and  Intact Exudate Large Amount: Exudate Type: Serous Exudate Color: amber Foul Odor After Cleansing: No Slough/Fibrino Yes Wound Bed Granulation Amount: Large (67-100%) Exposed Structure Granulation Quality: Red Fascia Exposed: No Necrotic Amount: Small (1-33%) Fat Layer (Subcutaneous Tissue) Exposed: No Necrotic Quality: Donald Slough Tendon Exposed: No Muscle Exposed: No Joint Exposed: No Bone Exposed: No Fabrizio, Jewett C. ([id number]) Periwound Skin Texture Texture Color No Abnormalities Noted: No No Abnormalities Noted: No Moisture Temperature / Pain No Abnormalities Noted: No Temperature: No Abnormality Tenderness on Palpation: Yes Wound Preparation Ulcer Cleansing: Rinsed/Irrigated with Saline Topical Anesthetic Applied: None Treatment Notes Wound #11 (Left, Dorsal Foot) 1. Cleansed with: Clean wound with Normal Saline 2. Anesthetic Topical Lidocaine 4% cream to wound bed prior to debridement 4. Dressing Applied: Other dressing (specify in notes) 7. Secured with 3 Layer Compression System - Bilateral Notes silvercell, drawtex, abd, Electronic Signature(s) Signed: 12/02/2017 4:37:47 PM By: Alejandro Mulling Entered By: Alejandro Mulling on 12/02/2017 16:10:38 [name] ([id number]) -------------------------------------------------------------------------------- Wound Assessment Details Patient Name: Donald Musca C. Date of Service: 12/02/2017 3:45 PM Medical Record Number: [id number] Patient Account Number: [id number] Date of Birth/Sex: 08/13/1929 (84 y.o. M) Treating RN: Phillis Haggis Primary Care Ozzy Bohlken: Guerry Potts Other Clinician: Referring Charleston Hankin: Guerry Potts Treating Tariana Moldovan/Extender: Altamese [place] in Treatment: 3 Wound Status Wound Number: 12 Primary To be determined Etiology: Wound Location: Right Knee Wound Status: Open Wounding Event: Gradually Appeared Comorbid Cataracts, Arrhythmia, Deep Vein Date  Acquired: 12/02/2017 History: Thrombosis, Osteoarthritis Weeks Of Treatment: 0 Clustered Wound: No Photos Photo Uploaded By: Alejandro Mulling on 12/02/2017 16:19:09 Wound Measurements Length: (cm) 1.2 Width: (cm) 0.9 Depth: (cm) 0.1 Area: (cm) 0.848 Volume: (cm) 0.085 % Reduction in Area: % Reduction in Volume: Epithelialization: None Tunneling: No Undermining: No Wound Description Full Thickness Without Exposed Support Classification: Potts Wound Margin: Distinct, outline attached Exudate None Present Amount: Foul Odor After Cleansing: No Slough/Fibrino Yes Wound Bed Granulation Amount: None Present (0%) Exposed Structure Necrotic Amount: Large (67-100%) Fascia Exposed: No Necrotic Quality: Potts, Donald Slough Fat Layer (Subcutaneous Tissue) Exposed: No Tendon Exposed: No Muscle Exposed: No Joint Exposed: No Bone Exposed: No Periwound Skin Texture Texture Color Skelly, Creighton C. ([id number]) No Abnormalities Noted: No No Abnormalities Noted: No Moisture Temperature / Pain No Abnormalities Noted: No Temperature: No Abnormality Tenderness on Palpation: Yes Wound Preparation Ulcer Cleansing: Rinsed/Irrigated with Saline Topical Anesthetic Applied: Other: lidocaine 4%, Treatment Notes Wound #12 (Right Knee) 1. Cleansed with: Clean wound with Normal Saline 2. Anesthetic Topical Lidocaine 4% cream to wound bed prior to debridement 4. Dressing Applied: Other dressing (specify in notes) 7. Secured with 3 Layer Compression System - Bilateral Notes silvercell, drawtex, abd, Electronic Signature(s) Signed: 12/02/2017 4:37:47 PM By: Alejandro Mulling Entered By: Alejandro Mulling on 12/02/2017 16:12:57 [place]beck, [name] ([id number]) -------------------------------------------------------------------------------- Wound Assessment Details Patient Name: Donald Musca C. Date of Service: 12/02/2017 3:45 PM Medical Record Number: [id number] Patient  Account Number: [id number] Date of Birth/Sex: 1928/09/24 (84 y.o. M) Treating RN: Phillis Haggis Primary Care Paolo Okane: Guerry Potts Other Clinician: Referring Walaa Carel: Guerry Potts Treating Dnya Hickle/Extender: [employer]amese [place] in Treatment: 3 Wound Status Wound Number: 5 Primary  Venous Leg Ulcer Etiology: Wound Location: Left Malleolus - Medial Wound Status: Open Wounding Event: Blister Comorbid Cataracts, Arrhythmia, Deep Vein Date Acquired: 10/28/2017 History: Thrombosis, Osteoarthritis Weeks Of Treatment: 3 Clustered Wound: No Photos Photo Uploaded By: Alejandro Mulling on 12/02/2017 16:17:04 Wound Measurements Length: (cm) 0.9 Width: (cm) 1 Depth: (cm) 0.1 Area: (cm) 0.707 Volume: (cm) 0.071 % Reduction in Area: -199.6% % Reduction in Volume: -195.8% Epithelialization: None Tunneling: No Undermining: No Wound Description Full Thickness Without Exposed Support Classification: Potts Wound Margin: Flat and Intact Exudate Large Amount: Exudate Type: Serosanguineous Exudate Color: red, brown Foul Odor After Cleansing: No Slough/Fibrino Yes Wound Bed Granulation Amount: Medium (34-66%) Exposed Structure Granulation Quality: Red Fascia Exposed: No Necrotic Amount: Medium (34-66%) Fat Layer (Subcutaneous Tissue) Exposed: Yes Necrotic Quality: Donald Slough Tendon Exposed: No Muscle Exposed: No Joint Exposed: No Bone Exposed: No Howton, Randie C. ([id number]) Periwound Skin Texture Texture Color No Abnormalities Noted: No No Abnormalities Noted: No Erythema: Yes Moisture Erythema Location: Circumferential No Abnormalities Noted: No Temperature / Pain Temperature: No Abnormality Tenderness on Palpation: Yes Wound Preparation Ulcer Cleansing: Rinsed/Irrigated with Saline Topical Anesthetic Applied: Other: lidocaine 4%, Treatment Notes Wound #5 (Left, Medial Malleolus) 1. Cleansed with: Clean wound with Normal Saline 2.  Anesthetic Topical Lidocaine 4% cream to wound bed prior to debridement 4. Dressing Applied: Other dressing (specify in notes) 7. Secured with 3 Layer Compression System - Bilateral Notes silvercell, drawtex, abd, Electronic Signature(s) Signed: 12/02/2017 4:37:47 PM By: Alejandro Mulling Entered By: Alejandro Mulling on 12/02/2017 16:02:59 Eve, [name] ([id number]) -------------------------------------------------------------------------------- Wound Assessment Details Patient Name: Donald Musca C. Date of Service: 12/02/2017 3:45 PM Medical Record Number: [id number] Patient Account Number: [id number] Date of Birth/Sex: 02/21/1929 (84 y.o. M) Treating RN: Phillis Haggis Primary Care Desteni Piscopo: Guerry Potts Other Clinician: Referring Adiyah Lame: Guerry Potts Treating Gaje Tennyson/Extender: Altamese [place] in Treatment: 3 Wound Status Wound Number: 6 Primary Venous Leg Ulcer Etiology: Wound Location: Right Lower Leg - Medial Wound Status: Healed - Epithelialized Wounding Event: Blister Comorbid Cataracts, Arrhythmia, Deep Vein Date Acquired: 10/28/2017 History: Thrombosis, Osteoarthritis Weeks Of Treatment: 3 Clustered Wound: No Photos Photo Uploaded By: Alejandro Mulling on 12/02/2017 16:17:04 Wound Measurements Length: (cm) 0 Width: (cm) 0 Depth: (cm) 0 Area: (cm) 0 Volume: (cm) 0 % Reduction in Area: 100% % Reduction in Volume: 100% Epithelialization: Large (67-100%) Tunneling: No Undermining: No Wound Description Full Thickness Without Exposed Support Classification: Potts Wound Margin: Flat and Intact Exudate None Present Amount: Foul Odor After Cleansing: No Slough/Fibrino No Wound Bed Granulation Amount: None Present (0%) Exposed Structure Necrotic Amount: None Present (0%) Fascia Exposed: No Fat Layer (Subcutaneous Tissue) Exposed: Yes Tendon Exposed: No Muscle Exposed: No Joint Exposed: No Bone Exposed: No Periwound Skin  Texture Texture Color Dooley, Taiyo C. ([id number]) No Abnormalities Noted: No No Abnormalities Noted: No Callus: No Atrophie Blanche: No Crepitus: No Cyanosis: No Excoriation: No Ecchymosis: No Induration: No Erythema: Yes Rash: No Erythema Location: Circumferential Scarring: No Hemosiderin Staining: No Mottled: No Moisture Pallor: No No Abnormalities Noted: No Rubor: No Dry / Scaly: No Maceration: No Temperature / Pain Temperature: No Abnormality Tenderness on Palpation: Yes Wound Preparation Ulcer Cleansing: Rinsed/Irrigated with Saline Topical Anesthetic Applied: None Electronic Signature(s) Signed: 12/02/2017 4:37:47 PM By: Alejandro Mulling Entered By: Alejandro Mulling on 12/02/2017 16:03:54 Caris, [name] ([id number]) -------------------------------------------------------------------------------- Wound Assessment Details Patient Name: Donald Musca C. Date of Service: 12/02/2017 3:45 PM Medical Record Number: [id number] Patient Account Number: [id number] Date of Birth/Sex: July 30, 1929 (84 y.o. M) Treating  RN: Phillis Haggis Primary Care Jochebed Bills: Guerry Potts Other Clinician: Referring Julius Matus: Guerry Potts Treating Tallulah Hosman/Extender: Altamese [place] in Treatment: 3 Wound Status Wound Number: 7 Primary Venous Leg Ulcer Etiology: Wound Location: Right Lower Leg - Anterior Wound Status: Open Wounding Event: Gradually Appeared Comorbid Cataracts, Arrhythmia, Deep Vein Date Acquired: 10/28/2017 History: Thrombosis, Osteoarthritis Weeks Of Treatment: 3 Clustered Wound: No Photos Photo Uploaded By: Alejandro Mulling on 12/02/2017 16:17:38 Wound Measurements Length: (cm) 0.1 Width: (cm) 0.1 Depth: (cm) 0.1 Area: (cm) 0.008 Volume: (cm) 0.001 % Reduction in Area: 95.9% % Reduction in Volume: 95% Epithelialization: None Tunneling: No Undermining: No Wound Description Full Thickness Without Exposed  Support Classification: Potts Wound Margin: Flat and Intact Exudate None Present Amount: Foul Odor After Cleansing: No Slough/Fibrino No Wound Bed Granulation Amount: None Present (0%) Exposed Structure Necrotic Amount: Large (67-100%) Fascia Exposed: No Necrotic Quality: Potts Fat Layer (Subcutaneous Tissue) Exposed: No Tendon Exposed: No Muscle Exposed: No Joint Exposed: No Bone Exposed: No Periwound Skin Texture Texture Color Zenner, Edd C. ([id number]) No Abnormalities Noted: No No Abnormalities Noted: No Callus: No Atrophie Blanche: No Crepitus: No Cyanosis: No Excoriation: No Ecchymosis: No Induration: No Erythema: No Rash: No Hemosiderin Staining: No Scarring: No Mottled: No Pallor: No Moisture Rubor: No No Abnormalities Noted: No Dry / Scaly: No Temperature / Pain Maceration: No Temperature: No Abnormality Tenderness on Palpation: Yes Wound Preparation Ulcer Cleansing: Rinsed/Irrigated with Saline Topical Anesthetic Applied: Other: lidocaine 4%, Treatment Notes Wound #7 (Right, Anterior Lower Leg) 1. Cleansed with: Clean wound with Normal Saline 2. Anesthetic Topical Lidocaine 4% cream to wound bed prior to debridement 4. Dressing Applied: Other dressing (specify in notes) 7. Secured with 3 Layer Compression System - Bilateral Notes silvercell, drawtex, abd, Electronic Signature(s) Signed: 12/02/2017 4:37:47 PM By: Alejandro Mulling Entered By: Alejandro Mulling on 12/02/2017 16:04:56 [place], [name] ([id number]) -------------------------------------------------------------------------------- Wound Assessment Details Patient Name: Donald Musca C. Date of Service: 12/02/2017 3:45 PM Medical Record Number: [id number] Patient Account Number: [id number] Date of Birth/Sex: 1929/05/05 (84 y.o. M) Treating RN: Phillis Haggis Primary Care Zamora Colton: Guerry Potts Other Clinician: Referring Antinette Keough: Guerry Potts Treating  Rosalynn Sergent/Extender: Altamese [place] in Treatment: 3 Wound Status Wound Number: 8 Primary Venous Leg Ulcer Etiology: Wound Location: Right Lower Leg - Lateral Wound Status: Open Wounding Event: Blister Comorbid Cataracts, Arrhythmia, Deep Vein Date Acquired: 10/28/2017 History: Thrombosis, Osteoarthritis Weeks Of Treatment: 3 Clustered Wound: No Photos Photo Uploaded By: Alejandro Mulling on 12/02/2017 16:17:40 Wound Measurements Length: (cm) 0 Width: (cm) 0 Depth: (cm) 0 Area: (cm) 0 Volume: (cm) 0 % Reduction in Area: 100% % Reduction in Volume: 100% Epithelialization: None Tunneling: No Undermining: No Wound Description Full Thickness Without Exposed Support Foul Classification: Potts Slou Wound Margin: Flat and Intact Exudate None Present Amount: Odor After Cleansing: No gh/Fibrino No Wound Bed Granulation Amount: None Present (0%) Exposed Structure Necrotic Amount: None Present (0%) Fascia Exposed: No Fat Layer (Subcutaneous Tissue) Exposed: No Tendon Exposed: No Muscle Exposed: No Joint Exposed: No Bone Exposed: No Periwound Skin Texture Texture Color Courtois, Ash C. ([id number]) No Abnormalities Noted: No No Abnormalities Noted: No Moisture Temperature / Pain No Abnormalities Noted: No Temperature: No Abnormality Tenderness on Palpation: Yes Wound Preparation Ulcer Cleansing: Rinsed/Irrigated with Saline Topical Anesthetic Applied: None Electronic Signature(s) Signed: 12/02/2017 4:37:47 PM By: Alejandro Mulling Entered By: Alejandro Mulling on 12/02/2017 16:05:50 [name] ([id number]) -------------------------------------------------------------------------------- Vitals Details Patient Name: Donald Musca C. Date of Service: 12/02/2017 3:45 PM Medical Record Number: [id number]  Patient Account Number: [id number] Date of Birth/Sex: 24-Apr-1929 (84 y.o. M) Treating RN: Phillis Haggis Primary Care Nykeem Citro: Guerry Potts Other Clinician: Referring Reanna Scoggin: Guerry Potts Treating Naiyah Klostermann/Extender: Altamese [place] in Treatment: 3 Vital Signs Time Taken: 15:53 Pulse (bpm): 101 Height (in): 68 Respiratory Rate (breaths/min): 16 Weight (lbs): 150 Blood Pressure (mmHg): 106/84 Body Mass Index (BMI): 22.8 Reference Range: 80 - 120 mg / dl Electronic Signature(s) Signed: 12/02/2017 4:37:47 PM By: Alejandro Mulling Entered By: Alejandro Mulling on 12/02/2017 15:58:49

## 2017-12-04 NOTE — Telephone Encounter (Signed)
Call placed to Pt.  Pt notified of lab results.  Per Pt, he is taking furosemide 40 mg one tablet by mouth daily.  Drug added to Pt med list.

## 2017-12-06 NOTE — Telephone Encounter (Signed)
Would continue current lasix dosing.

## 2017-12-07 DIAGNOSIS — I87333 Chronic venous hypertension (idiopathic) with ulcer and inflammation of bilateral lower extremity: Secondary | ICD-10-CM | POA: Diagnosis not present

## 2017-12-07 DIAGNOSIS — L97321 Non-pressure chronic ulcer of left ankle limited to breakdown of skin: Secondary | ICD-10-CM | POA: Diagnosis not present

## 2017-12-07 DIAGNOSIS — I10 Essential (primary) hypertension: Secondary | ICD-10-CM | POA: Diagnosis not present

## 2017-12-07 DIAGNOSIS — L97221 Non-pressure chronic ulcer of left calf limited to breakdown of skin: Secondary | ICD-10-CM | POA: Diagnosis not present

## 2017-12-07 DIAGNOSIS — L97211 Non-pressure chronic ulcer of right calf limited to breakdown of skin: Secondary | ICD-10-CM | POA: Diagnosis not present

## 2017-12-07 DIAGNOSIS — I4891 Unspecified atrial fibrillation: Secondary | ICD-10-CM | POA: Diagnosis not present

## 2017-12-07 NOTE — Telephone Encounter (Signed)
Spoke with Pt wife.  Advised Dr. Johney FrameAllred aware Pt taking furosemide 40 mg daily, Dr. Johney FrameAllred does not want to add anything on to his diuretic therapy at this time.  Wife indicates understanding.  Notified to call if any issues.

## 2017-12-09 ENCOUNTER — Encounter: Payer: Medicare Other | Attending: Internal Medicine | Admitting: Internal Medicine

## 2017-12-09 DIAGNOSIS — I87312 Chronic venous hypertension (idiopathic) with ulcer of left lower extremity: Secondary | ICD-10-CM | POA: Diagnosis not present

## 2017-12-09 DIAGNOSIS — S91302A Unspecified open wound, left foot, initial encounter: Secondary | ICD-10-CM | POA: Diagnosis not present

## 2017-12-09 DIAGNOSIS — I87333 Chronic venous hypertension (idiopathic) with ulcer and inflammation of bilateral lower extremity: Secondary | ICD-10-CM | POA: Diagnosis not present

## 2017-12-09 DIAGNOSIS — L97211 Non-pressure chronic ulcer of right calf limited to breakdown of skin: Secondary | ICD-10-CM | POA: Diagnosis not present

## 2017-12-09 DIAGNOSIS — L97221 Non-pressure chronic ulcer of left calf limited to breakdown of skin: Secondary | ICD-10-CM | POA: Insufficient documentation

## 2017-12-09 DIAGNOSIS — Z950 Presence of cardiac pacemaker: Secondary | ICD-10-CM | POA: Diagnosis not present

## 2017-12-09 DIAGNOSIS — Z7901 Long term (current) use of anticoagulants: Secondary | ICD-10-CM | POA: Diagnosis not present

## 2017-12-09 DIAGNOSIS — I771 Stricture of artery: Secondary | ICD-10-CM | POA: Diagnosis not present

## 2017-12-09 DIAGNOSIS — Z86718 Personal history of other venous thrombosis and embolism: Secondary | ICD-10-CM | POA: Insufficient documentation

## 2017-12-09 DIAGNOSIS — I872 Venous insufficiency (chronic) (peripheral): Secondary | ICD-10-CM | POA: Diagnosis not present

## 2017-12-09 DIAGNOSIS — L97822 Non-pressure chronic ulcer of other part of left lower leg with fat layer exposed: Secondary | ICD-10-CM | POA: Diagnosis not present

## 2017-12-09 DIAGNOSIS — I4891 Unspecified atrial fibrillation: Secondary | ICD-10-CM | POA: Insufficient documentation

## 2017-12-11 DIAGNOSIS — I87333 Chronic venous hypertension (idiopathic) with ulcer and inflammation of bilateral lower extremity: Secondary | ICD-10-CM | POA: Diagnosis not present

## 2017-12-11 DIAGNOSIS — I10 Essential (primary) hypertension: Secondary | ICD-10-CM | POA: Diagnosis not present

## 2017-12-11 DIAGNOSIS — L97321 Non-pressure chronic ulcer of left ankle limited to breakdown of skin: Secondary | ICD-10-CM | POA: Diagnosis not present

## 2017-12-11 DIAGNOSIS — L97221 Non-pressure chronic ulcer of left calf limited to breakdown of skin: Secondary | ICD-10-CM | POA: Diagnosis not present

## 2017-12-11 DIAGNOSIS — I4891 Unspecified atrial fibrillation: Secondary | ICD-10-CM | POA: Diagnosis not present

## 2017-12-11 DIAGNOSIS — L97211 Non-pressure chronic ulcer of right calf limited to breakdown of skin: Secondary | ICD-10-CM | POA: Diagnosis not present

## 2017-12-14 DIAGNOSIS — L97221 Non-pressure chronic ulcer of left calf limited to breakdown of skin: Secondary | ICD-10-CM | POA: Diagnosis not present

## 2017-12-14 DIAGNOSIS — I4891 Unspecified atrial fibrillation: Secondary | ICD-10-CM | POA: Diagnosis not present

## 2017-12-14 DIAGNOSIS — I87333 Chronic venous hypertension (idiopathic) with ulcer and inflammation of bilateral lower extremity: Secondary | ICD-10-CM | POA: Diagnosis not present

## 2017-12-14 DIAGNOSIS — L97211 Non-pressure chronic ulcer of right calf limited to breakdown of skin: Secondary | ICD-10-CM | POA: Diagnosis not present

## 2017-12-14 DIAGNOSIS — I10 Essential (primary) hypertension: Secondary | ICD-10-CM | POA: Diagnosis not present

## 2017-12-14 DIAGNOSIS — L97321 Non-pressure chronic ulcer of left ankle limited to breakdown of skin: Secondary | ICD-10-CM | POA: Diagnosis not present

## 2017-12-14 NOTE — Progress Notes (Signed)
MARCO, RAPER ([id number]) Visit Report for 12/09/2017 Debridement Details Patient Name: [name], [name]. Date of Service: 12/09/2017 3:00 PM Medical Record Number: [id number] Patient Account Number: [id number] Date of Birth/Sex: 03/07/1929 (84 y.o. M) Treating RN: Huel Coventry Primary Care Provider: Guerry Bruin Other Clinician: Referring Provider: Guerry Bruin Treating Provider/Extender: Altamese [place] in Treatment: 4 Debridement Performed for Wound #11 Left,Dorsal Foot Assessment: Performed By: Physician Maxwell Caul, MD Debridement Type: Debridement Pre-procedure Verification/Time Yes - 15:20 Out Taken: Start Time: 15:20 Pain Control: Other : lidocaine 4% Total Area Debrided (L x W): 1.5 (cm) x 0.2 (cm) = 0.3 (cm) Tissue and other material Viable, Non-Viable, Slough, Subcutaneous, Slough debrided: Level: Skin/Subcutaneous Tissue Debridement Description: Excisional Instrument: Curette Bleeding: Minimum Hemostasis Achieved: Pressure End Time: 15:26 Procedural Pain: 0 Post Procedural Pain: 0 Response to Treatment: Procedure was tolerated well Post Debridement Measurements of Total Wound Length: (cm) 1.5 Width: (cm) 0.2 Depth: (cm) 0.2 Volume: (cm) 0.047 Character of Wound/Ulcer Post Debridement: Requires Further Debridement Post Procedure Diagnosis Same as Pre-procedure Electronic Signature(s) Signed: 12/09/2017 4:49:21 PM By: Baltazar Najjar MD Signed: 12/14/2017 9:08:37 AM By: Elliot Gurney, [name], RN, CWS, [name], [name] Entered By: Baltazar Najjar on 12/09/2017 16:10:04 [name] ([id number]) -------------------------------------------------------------------------------- Debridement Details Patient Name: Donald Musca C. Date of Service: 12/09/2017 3:00 PM Medical Record Number: [id number] Patient Account Number: [id number] Date of Birth/Sex: 1928-12-15 (84 y.o. M) Treating RN: Huel Coventry Primary Care Provider: Guerry Bruin Other  Clinician: Referring Provider: Guerry Bruin Treating Provider/Extender: Altamese [place] in Treatment: 4 Debridement Performed for Wound #13 Left,Anterior Lower Leg Assessment: Performed By: Physician Maxwell Caul, MD Debridement Type: Debridement Severity of Tissue Pre Fat layer exposed Debridement: Pre-procedure Verification/Time Yes - 15:20 Out Taken: Start Time: 15:20 Pain Control: Other : lidocaine 4% Total Area Debrided (L x W): 0.3 (cm) x 0.5 (cm) = 0.15 (cm) Tissue and other material Viable, Non-Viable, Slough, Subcutaneous, Slough debrided: Level: Skin/Subcutaneous Tissue Debridement Description: Excisional Instrument: Curette Bleeding: Minimum Hemostasis Achieved: Pressure End Time: 15:26 Procedural Pain: 0 Post Procedural Pain: 0 Response to Treatment: Procedure was tolerated well Post Debridement Measurements of Total Wound Length: (cm) 0.3 Width: (cm) 0.5 Depth: (cm) 0.1 Volume: (cm) 0.012 Character of Wound/Ulcer Post Debridement: Requires Further Debridement Severity of Tissue Post Debridement: Fat layer exposed Post Procedure Diagnosis Same as Pre-procedure Electronic Signature(s) Signed: 12/09/2017 4:49:21 PM By: Baltazar Najjar MD Signed: 12/14/2017 9:08:37 AM By: Elliot Gurney, [name], RN, CWS, [name], [name] Entered By: Baltazar Najjar on 12/09/2017 16:10:19 [name] ([id number]) -------------------------------------------------------------------------------- HPI Details Patient Name: Donald Musca C. Date of Service: 12/09/2017 3:00 PM Medical Record Number: [id number] Patient Account Number: [id number] Date of Birth/Sex: 08-May-1929 (84 y.o. M) Treating RN: Huel Coventry Primary Care Provider: Guerry Bruin Other Clinician: Referring Provider: Guerry Bruin Treating Provider/Extender: Altamese [place] in Treatment: 4 History of Present Illness HPI Description: Pleasant 84 year old with history of chronic venous  insufficiency, remote history of DVT, atrial fibrillation (on Coumadin). No history of diabetes. He says that he developed worsening left lower extremity swelling, blisters, and subsequent ulcerations in late October 2016. He was started on Augmentin for left lower extremity cellulitis, which has improved. Arterial ultrasound 07/27/2015 showed no significant peripheral arterial disease. Awaiting venous ultrasound. Performing dressing changes with silver alginate and using a Tubigrip for edema control. He denies any significant pain. No claudication or rest pain. Ambulating per his baseline with a walker. No fever or chills. Moderate drainage. 10/10/15; the patient arrives  back today having not been seen in almost 2 months. He has no open wounds on his legs. He has a history of venous insufficiency, arterial insufficiency. He has chronic Coumadin use secondary to atrial fibrillation and a remote history of DVT. Fortunately as he has not been here in 2 months we were not able to order him stockings. He is not able to get on standard compression hose. Juxtalite stockings would cost him probably about $100 appear, the couple was not able to afford this. 11/11/17 READMISSION This is an 84 year old man who is not a diabetic. He does have a history of sick sinus syndrome atrial fibrillation and has a cardiac pacemaker and is on Coumadin.he also has a history of DVT. His wife states that episodically over the last 2 years he has had wounds on his lower extremities but they have healed with home measures. Roughly 2-3 weeks ago the legs became a lot more swollen and he has had draining areas on the right anterior medial and lateral and the left medial leg. The patient's wife states that they were told to put support stockings on him but they are concerned because they worsen the blisters. this bit difficult to get the history but they think the edema has been increasing over the last several months. 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 He does not complain of chest pain orthopnea. He does not have a history of kidney issues that he is aware. [facility] He is not on diuretics. He was here 2 years ago. He was noted to have edema in his legs at that time. He had an ultrasound of his legs which I'll need to review. There was discussion about compression stockings but he is complaining clearly not using them. ABI in the right in this clinic was 1.37, 1.07 on the left 11/18/17; this is an elderly frail man who has bilateral small lower extremity wounds in the setting of very significant multifactorial edema. He probably has chronic venous insufficiency with lymphedema but it is clearly fluid overloaded last week. He apparently has started on diuretics [facility] but the patient's wife is not sure which one but states that he goes to the bathroom every 15 minutes. He is tolerating compression wraps. His wife stated that they charged him full price stating that he didn't have open wounds on his legs that would cover wraparound stockings. His edema is much better controlled 11/25/17-he is here in follow-up evaluation for bilateral lower extremity ulcers. He is tolerating compression therapy. There was one episode since last appointment with copious amounts of drainage from the right medial malleolus. We will add additional absorptive dressing, otherwise no change in treatment plan and he will follow-up next week 12/02/17; since I last saw this man is edema control is a lot better and I think this is a combination of diuretic therapy and are 3 layer compression. He has severe bilateral venous insufficiency with hemosiderin deposition and likely chronic stasis dermatitis/inflammation. In spite of the edema control he has several weeping areas on both legs including the left lateral leg, area on the  right posterior leg. He also has an excoriated area just below his right tibial tuberosity. Fortuitously the patient has a follow-up with cardiology tomorrow 12/09/17; his edema control is excellent now on 40 mg of Lasix on his cardiologist last week and according to his wife was thoroughly evaluated including  checking his pacemaker and no medication changes are made. He has good edema control in his lower legs. He has significant chronic venous insufficiency with hemosiderin physician as well as stasis dermatitis. major areas on the medial malleoli SHARRON, SIMPSON ([id number]) Electronic Signature(s) Signed: 12/09/2017 4:49:21 PM By: Baltazar Najjar MD Entered By: Baltazar Najjar on 12/09/2017 16:12:59 [name] ([id number]) -------------------------------------------------------------------------------- Physical Exam Details Patient Name: Donald Musca C. Date of Service: 12/09/2017 3:00 PM Medical Record Number: [id number] Patient Account Number: [id number] Date of Birth/Sex: June 03, 1929 (84 y.o. M) Treating RN: Huel Coventry Primary Care Provider: Guerry Bruin Other Clinician: Referring Provider: Guerry Bruin Treating Provider/Extender: Altamese [place] in Treatment: 4 Constitutional Sitting or standing Blood Pressure is within target range for patient.. Pulse regular and within target range for patient.Marland Kitchen appears in no distress. Notes exam; much better edema control than last week. In fact there is very little edema. Still open areas that are not closed including the left posterior lateral,left medial malleolus, left anterior foot leg right posterior leg . Debridement done with a #3 curet. Hemostasis with direct pressure removed necrotic surface debris. Electronic Signature(s) Signed: 12/09/2017 4:49:21 PM By: Baltazar Najjar MD Entered By: Baltazar Najjar on 12/09/2017 16:15:24 [name]  ([id number]) -------------------------------------------------------------------------------- Physician Orders Details Patient Name: [name] Date of Service: 12/09/2017 3:00 PM Medical Record Number: [id number] Patient Account Number: [id number] Date of Birth/Sex: [birth year] (84 y.o. M) Treating RN: Huel Coventry Primary Care Provider: Guerry Bruin Other Clinician: Referring Provider: Guerry Bruin Treating Provider/Extender: Altamese [place] in Treatment: 4 Verbal / Phone Orders: No Diagnosis Coding Wound Cleansing o Cleanse wound with mild soap and water - HHRN please wash legs Anesthetic (add to Medication List) o Topical Lidocaine 4% cream applied to wound bed prior to debridement (In Clinic Only). Skin Barriers/Peri-Wound Care o Moisturizing lotion - HHRN to moisturize o Other: - OTC Lotrimin Cream on top of right foot. Primary Wound Dressing o Silver Alginate - Wound #10 Right, Medial Malleolus, Wound #11 Left ,Dorsal Foot, Wound #13 Left, Anterior Lower Leg, Wound #5 Left, Medial Malleolus, Wound #7 Right, Anterior Lower Leg Secondary Dressing o ABD pad - cover all wounds o Drawtex - if needed for drainage o Foam - Reddened area at top of wraps bilaterally Dressing Change Frequency o Change Dressing Monday, Wednesday, Friday Follow-up Appointments o Return Appointment in 1 week. Edema Control o 3 Layer Compression System - Bilateral - Please do not place coban directly on legs or roll down tops of wraps. o Elevate legs to the level of the heart and pump ankles as often as possible Additional Orders / Instructions o Vitamin A; Vitamin C, Zinc o Activity as tolerated Home Health o Continue Home Health Visits o Home Health Nurse may visit PRN to address patientos wound care needs. o FACE TO FACE ENCOUNTER: MEDICARE and MEDICAID PATIENTS: I certify that this patient is under my care and that I had a face-to-face  encounter that meets the physician face-to-face encounter requirements with this patient on this date. The encounter with the patient was in whole or in part for the following MEDICAL CONDITION: (primary reason for Home Healthcare) MEDICAL NECESSITY: I certify, that based on my findings, NURSING services are a medically necessary home health service. HOME BOUND STATUS: I certify that my clinical findings support that this patient is homebound (i.e., Due to illness or injury, pt requires aid of supportive devices such as crutches, cane, wheelchairs, walkers, the use of special transportation or  the assistance of another person to leave their place of residence. There is a normal inability to leave the home and doing so requires considerable and taxing effort. Other absences are for medical reasons / religious services and are infrequent or of short duration when for other reasons). COURTLAND, REAS ([id number]) o If current dressing causes regression in wound condition, may D/C ordered dressing product/s and apply Normal Saline Moist Dressing daily until next Wound Healing Center / Other MD appointment. Notify Wound Healing Center of regression in wound condition at [phone]. o Please direct any NON-WOUND related issues/requests for orders to patient's Primary Care Physician Electronic Signature(s) Signed: 12/09/2017 4:49:21 PM By: Baltazar Najjar MD Signed: 12/14/2017 9:08:37 AM By: Elliot Gurney, [name], RN, CWS, [name], [name] Entered By: Elliot Gurney, [name], RN, CWS, Kim on 12/09/2017 15:37:46 [name] ([id number]) -------------------------------------------------------------------------------- Problem List Details Patient Name: TREYVION, DURKEE C. Date of Service: 12/09/2017 3:00 PM Medical Record Number: [id number] Patient Account Number: [id number] Date of Birth/Sex: 06/28/1929 (84 y.o. M) Treating RN: Huel Coventry Primary Care Provider: Guerry Bruin Other Clinician: Referring Provider:  Guerry Bruin Treating Provider/Extender: Altamese [place] in Treatment: 4 Active Problems ICD-10 Impacting Encounter Code Description Active Date Wound Healing Diagnosis L97.211 Non-pressure chronic ulcer of right calf limited to breakdown 11/11/2017 Yes of skin L97.221 Non-pressure chronic ulcer of left calf limited to breakdown of 11/11/2017 Yes skin I87.333 Chronic venous hypertension (idiopathic) with ulcer and 11/11/2017 Yes inflammation of bilateral lower extremity Inactive Problems Resolved Problems Electronic Signature(s) Signed: 12/09/2017 4:49:21 PM By: Baltazar Najjar MD Entered By: Baltazar Najjar on 12/09/2017 16:09:16 Agostini, [name] ([id number]) -------------------------------------------------------------------------------- Progress Note Details Patient Name: Donald Musca C. Date of Service: 12/09/2017 3:00 PM Medical Record Number: [id number] Patient Account Number: [id number] Date of Birth/Sex: Mar 16, 1929 (84 y.o. M) Treating RN: Huel Coventry Primary Care Provider: Guerry Bruin Other Clinician: Referring Provider: Guerry Bruin Treating Provider/Extender: Altamese [place] in Treatment: 4 Subjective History of Present Illness (HPI) Pleasant 84 year old with history of chronic venous insufficiency, remote history of DVT, atrial fibrillation (on Coumadin). No history of diabetes. He says that he developed worsening left lower extremity swelling, blisters, and subsequent ulcerations in late October 2016. He was started on Augmentin for left lower extremity cellulitis, which has improved. Arterial ultrasound 07/27/2015 showed no significant peripheral arterial disease. Awaiting venous ultrasound. Performing dressing changes with silver alginate and using a Tubigrip for edema control. He denies any significant pain. No claudication or rest pain. Ambulating per his baseline with a walker. No fever or chills. Moderate drainage. 10/10/15; the patient  arrives back today having not been seen in almost 2 months. He has no open wounds on his legs. He has a history of venous insufficiency, arterial insufficiency. He has chronic Coumadin use secondary to atrial fibrillation and a remote history of DVT. Fortunately as he has not been here in 2 months we were not able to order him stockings. He is not able to get on standard compression hose. Juxtalite stockings would cost him probably about $100 appear, the couple was not able to afford this. 11/11/17 READMISSION This is an 84 year old man who is not a diabetic. He does have a history of sick sinus syndrome atrial fibrillation and has a cardiac pacemaker and is on Coumadin.he also has a history of DVT. His wife states that episodically over the last 2 years he has had wounds on his lower extremities but they have healed with home measures. Roughly 2-3 weeks ago the legs became a  lot more swollen and he has had draining areas on the right anterior medial and lateral and the left medial leg. The patient's wife states that they were told to put support stockings on him but they are concerned because they worsen the blisters. this bit difficult to get the history but they think the edema has been increasing over the last several months. 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 He does not complain of chest pain orthopnea. He does not have a history of kidney issues that he is aware. Primary doctor at [facility]. He is not on diuretics. He was here 2 years ago. He was noted to have edema in his legs at that time. He had an ultrasound of his legs which I'll need to review. There was discussion about compression stockings but he is complaining clearly not using them. ABI in the right in this clinic was 1.37, 1.07 on the left 11/18/17; this is an elderly frail man who has bilateral small lower extremity wounds in the setting of very significant multifactorial edema.  He probably has chronic venous insufficiency with lymphedema but it is clearly fluid overloaded last week. He apparently has started on diuretics through his primary physician at [facility] but the patient's wife is not sure which one but states that he goes to the bathroom every 15 minutes. He is tolerating compression wraps. His wife stated that they charged him full price stating that he didn't have open wounds on his legs that would cover wraparound stockings. His edema is much better controlled 11/25/17-he is here in follow-up evaluation for bilateral lower extremity ulcers. He is tolerating compression therapy. There was one episode since last appointment with copious amounts of drainage from the right medial malleolus. We will add additional absorptive dressing, otherwise no change in treatment plan and he will follow-up next week 12/02/17; since I last saw this man is edema control is a lot better and I think this is a combination of diuretic therapy and are 3 layer compression. He has severe bilateral venous insufficiency with hemosiderin deposition and likely chronic stasis dermatitis/inflammation. In spite of the edema control he has several weeping areas on both legs including the left lateral leg, area on the right posterior leg. He also has an excoriated area just below his right tibial tuberosity. Fortuitously the patient has a follow-up with cardiology tomorrow 12/09/17; his edema control is excellent now on 40 mg of Lasix on his cardiologist last week and according to his wife was thoroughly evaluated including checking his pacemaker and no medication changes are made. He has good edema control in his lower legs. He has significant chronic venous insufficiency with hemosiderin physician as well as stasis dermatitis. major Shawley, Gleason C. ([id number]) areas on the medial malleoli Objective Constitutional Sitting or standing Blood Pressure is within target range for  patient.. Pulse regular and within target range for patient.Marland Kitchen appears in no distress. Vitals Time Taken: 3:00 PM, Height: 68 in, Weight: 150 lbs, BMI: 22.8, Pulse: 74 bpm, Respiratory Rate: 16 breaths/min, Blood Pressure: 116/64 mmHg. General Notes: exam; much better edema control than last week. In fact there is very little edema. Still open areas that are not closed including the left posterior lateral,left medial malleolus, left anterior foot leg right posterior leg . Debridement done with a #3 curet. Hemostasis with direct pressure removed necrotic surface debris. Integumentary (Hair, Skin) Wound #10 status is Open. Original cause of wound was Gradually  Appeared. The wound is located on the Right,Medial Malleolus. The wound measures 1.4cm length x 0.5cm width x 0.1cm depth; 0.55cm^2 area and 0.055cm^3 volume. There is no tunneling or undermining noted. There is a large amount of serosanguineous drainage noted. The wound margin is distinct with the outline attached to the wound base. There is large (67-100%) red granulation within the wound bed. There is a small (1-33%) amount of necrotic tissue within the wound bed including Adherent Slough. The periwound skin appearance exhibited: Maceration. Periwound temperature was noted as No Abnormality. The periwound has tenderness on palpation. Wound #11 status is Open. Original cause of wound was Gradually Appeared. The wound is located on the Left,Dorsal Foot. The wound measures 1.5cm length x 0.2cm width x 0.1cm depth; 0.236cm^2 area and 0.024cm^3 volume. There is no tunneling or undermining noted. There is a large amount of serous drainage noted. The wound margin is flat and intact. There is large (67-100%) red granulation within the wound bed. There is a small (1-33%) amount of necrotic tissue within the wound bed including Adherent Slough. The periwound skin appearance exhibited: Erythema. The periwound skin appearance did not exhibit: Callus,  Crepitus, Excoriation, Induration, Rash, Scarring, Dry/Scaly, Maceration, Atrophie Blanche, Cyanosis, Ecchymosis, Hemosiderin Staining, Mottled, Pallor, Rubor. The surrounding wound skin color is noted with erythema which is circumferential. Periwound temperature was noted as No Abnormality. The periwound has tenderness on palpation. Wound #12 status is Healed - Epithelialized. Original cause of wound was Gradually Appeared. The wound is located on the Right Knee. The wound measures 0cm length x 0cm width x 0cm depth; 0cm^2 area and 0cm^3 volume. There is no tunneling or undermining noted. There is a none present amount of drainage noted. The wound margin is distinct with the outline attached to the wound base. There is no granulation within the wound bed. There is no necrotic tissue within the wound bed. Periwound temperature was noted as No Abnormality. The periwound has tenderness on palpation. Wound #13 status is Open. Original cause of wound was Gradually Appeared. The wound is located on the Left,Anterior Lower Leg. The wound measures 0.3cm length x 0.5cm width x 0.1cm depth; 0.118cm^2 area and 0.012cm^3 volume. There is no tunneling or undermining noted. There is a large amount of serosanguineous drainage noted. The wound margin is flat and intact. There is medium (34-66%) red granulation within the wound bed. There is a medium (34-66%) amount of necrotic tissue within the wound bed including Adherent Slough. The periwound skin appearance exhibited: Maceration, Erythema. The surrounding wound skin color is noted with erythema which is circumferential. Periwound temperature was noted as No Abnormality. The periwound has tenderness on palpation. Wound #5 status is Open. Original cause of wound was Blister. The wound is located on the Left,Medial Malleolus. The wound measures 0.8cm length x 0.8cm width x 0.1cm depth; 0.503cm^2 area and 0.05cm^3 volume. There is Fat Layer (Subcutaneous Tissue)  Exposed exposed. There is no tunneling or undermining noted. There is a large amount of Limehouse, Raheen C. ([id number]) serosanguineous drainage noted. The wound margin is flat and intact. There is medium (34-66%) red granulation within the wound bed. There is a medium (34-66%) amount of necrotic tissue within the wound bed including Adherent Slough. The periwound skin appearance exhibited: Erythema. The periwound skin appearance did not exhibit: Callus, Crepitus, Excoriation, Induration, Rash, Scarring, Dry/Scaly, Maceration, Atrophie Blanche, Cyanosis, Ecchymosis, Hemosiderin Staining, Mottled, Pallor, Rubor. The surrounding wound skin color is noted with erythema which is circumferential. Periwound temperature was noted as No  Abnormality. The periwound has tenderness on palpation. Wound #7 status is Open. Original cause of wound was Gradually Appeared. The wound is located on the Right,Anterior Lower Leg. The wound measures 0.1cm length x 0.1cm width x 0.1cm depth; 0.008cm^2 area and 0.001cm^3 volume. There is no tunneling or undermining noted. There is a none present amount of drainage noted. The wound margin is flat and intact. There is no granulation within the wound bed. There is a large (67-100%) amount of necrotic tissue within the wound bed including Eschar. The periwound skin appearance did not exhibit: Callus, Crepitus, Excoriation, Induration, Rash, Scarring, Dry/Scaly, Maceration, Atrophie Blanche, Cyanosis, Ecchymosis, Hemosiderin Staining, Mottled, Pallor, Rubor, Erythema. Periwound temperature was noted as No Abnormality. The periwound has tenderness on palpation. Assessment Active Problems ICD-10 L97.211 - Non-pressure chronic ulcer of right calf limited to breakdown of skin L97.221 - Non-pressure chronic ulcer of left calf limited to breakdown of skin I87.333 - Chronic venous hypertension (idiopathic) with ulcer and inflammation of bilateral lower extremity Procedures Wound  #11 Pre-procedure diagnosis of Wound #11 is a To be determined located on the Left,Dorsal Foot . There was a Excisional Skin/Subcutaneous Tissue Debridement with a total area of 0.3 sq cm performed by Maxwell Caul, MD. With the following instrument(s): Curette. to remove Viable and Non-Viable tissue/material Material removed includes Subcutaneous Tissue, and Slough, and [place] after achieving pain control using Other (lidocaine 4%). A time out was conducted at 15:20, prior to the start of the procedure. A Minimum amount of bleeding was controlled with Pressure. The procedure was tolerated well with a pain level of 0 throughout and a pain level of 0 following the procedure. Post Debridement Measurements: 1.5cm length x 0.2cm width x 0.2cm depth; 0.047cm^3 volume. Character of Wound/Ulcer Post Debridement requires further debridement. Post procedure Diagnosis Wound #11: Same as Pre-Procedure Wound #13 Pre-procedure diagnosis of Wound #13 is a Venous Leg Ulcer located on the Left,Anterior Lower Leg .Severity of Tissue Pre Debridement is: Fat layer exposed. There was a Excisional Skin/Subcutaneous Tissue Debridement with a total area of 0.15 sq cm performed by Maxwell Caul, MD. With the following instrument(s): Curette. to remove Viable and Non-Viable tissue/material Material removed includes Subcutaneous Tissue, and Slough, and [place] after achieving pain control using Other (lidocaine 4%). A time out was conducted at 15:20, prior to the start of the procedure. A Minimum amount of bleeding was controlled with Pressure. The procedure was tolerated well with a pain level of 0 throughout and a pain level of 0 following the procedure. Post Debridement Measurements: 0.3cm length x 0.5cm width x 0.1cm depth; 0.012cm^3 volume. Character of Wound/Ulcer Post Debridement requires further debridement. Severity of Tissue Post Debridement is: Fat layer exposed. Post procedure Diagnosis Wound #13: Same  as Pre-Procedure Azure, Iwao C. ([id number]) Plan Wound Cleansing: Cleanse wound with mild soap and water - HHRN please wash legs Anesthetic (add to Medication List): Topical Lidocaine 4% cream applied to wound bed prior to debridement (In Clinic Only). Skin Barriers/Peri-Wound Care: Moisturizing lotion - HHRN to moisturize Other: - OTC Lotrimin Cream on top of right foot. Primary Wound Dressing: Silver Alginate - Wound #10 Right, Medial Malleolus, Wound #11 Left ,Dorsal Foot, Wound #13 Left, Anterior Lower Leg, Wound #5 Left, Medial Malleolus, Wound #7 Right, Anterior Lower Leg Secondary Dressing: ABD pad - cover all wounds Drawtex - if needed for drainage Foam - Reddened area at top of wraps bilaterally Dressing Change Frequency: Change Dressing Monday, Wednesday, Friday Follow-up Appointments: Return Appointment in 1  week. Edema Control: 3 Layer Compression System - Bilateral - Please do not place coban directly on legs or roll down tops of wraps. Elevate legs to the level of the heart and pump ankles as often as possible Additional Orders / Instructions: Vitamin A; Vitamin C, Zinc Activity as tolerated Home Health: Continue Home Health Visits Home Health Nurse may visit PRN to address patient s wound care needs. FACE TO FACE ENCOUNTER: MEDICARE and MEDICAID PATIENTS: I certify that this patient is under my care and that I had a face-to-face encounter that meets the physician face-to-face encounter requirements with this patient on this date. The encounter with the patient was in whole or in part for the following MEDICAL CONDITION: (primary reason for Home Healthcare) MEDICAL NECESSITY: I certify, that based on my findings, NURSING services are a medically necessary home health service. HOME BOUND STATUS: I certify that my clinical findings support that this patient is homebound (i.e., Due to illness or injury, pt requires aid of supportive devices such as crutches, cane,  wheelchairs, walkers, the use of special transportation or the assistance of another person to leave their place of residence. There is a normal inability to leave the home and doing so requires considerable and taxing effort. Other absences are for medical reasons / religious services and are infrequent or of short duration when for other reasons). If current dressing causes regression in wound condition, may D/C ordered dressing product/s and apply Normal Saline Moist Dressing daily until next Wound Healing Center / Other MD appointment. Notify Wound Healing Center of regression in wound condition at [phone]. Please direct any NON-WOUND related issues/requests for orders to patient's Primary Care Physician #1 continue with silver alginate to the wounds this week with 3 live compression #2 his edema control is excellent #3 he has a pair of Juxta light stockings although his wife is worried that these are going to fit because of the amount of edema that is come out of the leg PHILOPATEER, STRINE ([id number]) Electronic Signature(s) Signed: 12/09/2017 4:49:21 PM By: Baltazar Najjar MD Entered By: Baltazar Najjar on 12/09/2017 16:16:52 Malinoski, [name] ([id number]) -------------------------------------------------------------------------------- SuperBill Details Patient Name: Donald Musca C. Date of Service: 12/09/2017 Medical Record Number: [id number] Patient Account Number: [id number] Date of Birth/Sex: 1929-04-08 (84 y.o. M) Treating RN: Huel Coventry Primary Care Provider: Guerry Bruin Other Clinician: Referring Provider: Guerry Bruin Treating Provider/Extender: Altamese [place] in Treatment: 4 Diagnosis Coding ICD-10 Codes Code Description [phone] Non-pressure chronic ulcer of right calf limited to breakdown of skin L97.221 Non-pressure chronic ulcer of left calf limited to breakdown of skin I87.333 Chronic venous hypertension (idiopathic) with ulcer and  inflammation of bilateral lower extremity Facility Procedures CPT4 Code: [id number] Description: 11042 - DEB SUBQ TISSUE 20 SQ CM/< ICD-10 Diagnosis Description L97.211 Non-pressure chronic ulcer of right calf limited to breakdow L97.221 Non-pressure chronic ulcer of left calf limited to breakdown Modifier: n of skin of skin Quantity: 1 Physician Procedures CPT4 Code: [id number] Description: 11042 - WC PHYS SUBQ TISS 20 SQ CM ICD-10 Diagnosis Description L97.211 Non-pressure chronic ulcer of right calf limited to breakdow L97.221 Non-pressure chronic ulcer of left calf limited to breakdown Modifier: n of skin of skin Quantity: 1 Electronic Signature(s) Signed: 12/09/2017 4:49:21 PM By: Baltazar Najjar MD Entered By: Baltazar Najjar on 12/09/2017 16:17:18

## 2017-12-14 NOTE — Progress Notes (Signed)
ZAVIER, CANELA ([id number]) Visit Report for 12/09/2017 Arrival Information Details Patient Name: [name], [name]. Date of Service: 12/09/2017 3:00 PM Medical Record Number: [id number] Patient Account Number: [id number] Date of Birth/Sex: 1929-08-28 (84 y.o. M) Treating RN: Phillis Haggis Primary Care Hideko Esselman: Guerry Bruin Other Clinician: Referring Marijose Curington: Guerry Bruin Treating Milo Solana/Extender: Altamese [place] in Treatment: 4 Visit Information History Since Last Visit All ordered tests and consults were completed: No Patient Arrived: Wheel Chair Added or deleted any medications: No Arrival Time: 14:59 Any new allergies or adverse reactions: No Accompanied By: wife Had a fall or experienced change in No Transfer Assistance: EasyPivot Patient activities of daily living that may affect Lift risk of falls: Patient Identification Verified: Yes Signs or symptoms of abuse/neglect since last visito No Secondary Verification Process Yes Hospitalized since last visit: No Completed: Implantable device outside of the clinic excluding No Patient Requires Transmission-Based No cellular tissue based products placed in the center Precautions: since last visit: Patient Has Alerts: Yes Has Dressing in Place as Prescribed: Yes Patient Alerts: Patient on Blood Has Compression in Place as Prescribed: Yes Thinner Warfarin Pain Present Now: No Pacemaker Electronic Signature(s) Signed: 12/10/2017 4:32:30 PM By: Alejandro Mulling Entered By: Alejandro Mulling on 12/09/2017 14:59:45 [name] ([id number]) -------------------------------------------------------------------------------- Encounter Discharge Information Details Patient Name: Donald Musca C. Date of Service: 12/09/2017 3:00 PM Medical Record Number: [id number] Patient Account Number: [id number] Date of Birth/Sex: 1928-12-18 (84 y.o. M) Treating RN: Huel Coventry Primary Care Holden Draughon: Guerry Bruin  Other Clinician: Referring Brantlee Penn: Guerry Bruin Treating Micha Dosanjh/Extender: Altamese [place] in Treatment: 4 Encounter Discharge Information Items Discharge Pain Level: 0 Discharge Condition: Stable Ambulatory Status: Wheelchair Discharge Destination: Home Transportation: Private Auto Accompanied By: wife Schedule Follow-up Appointment: Yes Medication Reconciliation completed and No provided to Patient/Care Linc Donald: Provided on Clinical Summary of Care: 12/09/2017 Form Type Recipient Paper Patient RW Electronic Signature(s) Signed: 12/10/2017 4:32:30 PM By: Alejandro Mulling Entered By: Alejandro Mulling on 12/09/2017 15:48:11 [name] ([id number]) -------------------------------------------------------------------------------- Lower Extremity Assessment Details Patient Name: Donald Musca C. Date of Service: 12/09/2017 3:00 PM Medical Record Number: [id number] Patient Account Number: [id number] Date of Birth/Sex: [birth year] (84 y.o. M) Treating RN: Phillis Haggis Primary Care Righteous Claiborne: Guerry Bruin Other Clinician: Referring Jadi Deyarmin: Guerry Bruin Treating Yazlynn Birkeland/Extender: Altamese [place] in Treatment: 4 Edema Assessment Assessed: [Left: No] [Right: No] [Left: Edema] [Right: :] Calf Left: Right: Point of Measurement: 33 cm From Medial Instep 30.5 cm 29 cm Ankle Left: Right: Point of Measurement: 12 cm From Medial Instep 21.5 cm 21.5 cm Vascular Assessment Pulses: Dorsalis Pedis Palpable: [Left:Yes] [Right:Yes] Posterior Tibial Extremity colors, hair growth, and conditions: Extremity Color: [Left:Hyperpigmented] [Right:Hyperpigmented] Temperature of Extremity: [Left:Warm] [Right:Warm] Capillary Refill: [Left:< 3 seconds] [Right:< 3 seconds] Toe Nail Assessment Left: Right: Thick: Yes Yes Discolored: Yes Yes Deformed: Yes Yes Improper Length and Hygiene: Yes Yes Electronic Signature(s) Signed: 12/10/2017 4:32:30 PM By:  Alejandro Mulling Entered By: Alejandro Mulling on 12/09/2017 15:07:12 [name] ([id number]) -------------------------------------------------------------------------------- Multi Wound Chart Details Patient Name: Donald Musca C. Date of Service: 12/09/2017 3:00 PM Medical Record Number: [id number] Patient Account Number: [id number] Date of Birth/Sex: 1929-02-04 (84 y.o. M) Treating RN: Huel Coventry Primary Care Desmund Elman: Guerry Bruin Other Clinician: Referring Charda Janis: Guerry Bruin Treating Kanyla Omeara/Extender: Altamese [place] in Treatment: 4 Vital Signs Height(in): 68 Pulse(bpm): 74 Weight(lbs): 150 Blood Pressure(mmHg): 116/64 Body Mass Index(BMI): 23 Temperature(F): Respiratory Rate 16 (breaths/min): Photos: Wound Location: Right Malleolus - Medial Left Foot - Dorsal Right  Knee Wounding Event: Gradually Appeared Gradually Appeared Gradually Appeared Primary Etiology: Venous Leg Ulcer To be determined To be determined Comorbid History: Cataracts, Arrhythmia, Deep Cataracts, Arrhythmia, Deep Cataracts, Arrhythmia, Deep Vein Thrombosis, Vein Thrombosis, Vein Thrombosis, Osteoarthritis Osteoarthritis Osteoarthritis Date Acquired: 12/02/2017 12/02/2017 12/02/2017 Weeks of Treatment: 1 1 1  Wound Status: Open Open Healed - Epithelialized Measurements L x W x D 1.4x0.5x0.1 1.5x0.2x0.1 0x0x0 (cm) Area (cm) : 0.55 0.236 0 Volume (cm) : 0.055 0.024 0 % Reduction in Area: 69.50% 6.00% 100.00% % Reduction in Volume: 69.60% 4.00% 100.00% Classification: Full Thickness Without Full Thickness Without Full Thickness Without Exposed Support Structures Exposed Support Structures Exposed Support Structures Exudate Amount: Large Large None Present Exudate Type: Serosanguineous Serous N/A Exudate Color: red, brown amber N/A Wound Margin: Distinct, outline attached Flat and Intact Distinct, outline attached Granulation Amount: Large (67-100%) Large (67-100%) None  Present (0%) Granulation Quality: Red Red N/A Necrotic Amount: Small (1-33%) Small (1-33%) None Present (0%) Necrotic Tissue: Adherent Slough Adherent Slough N/A Exposed Structures: Fascia: No Fascia: No Fascia: No Fat Layer (Subcutaneous Fat Layer (Subcutaneous Fat Layer (Subcutaneous Tissue) Exposed: No Tissue) Exposed: No Tissue) Exposed: No Tendon: No Tendon: No Tendon: No Mirsky, Trinidad C. ([id number]) Muscle: No Muscle: No Muscle: No Joint: No Joint: No Joint: No Bone: No Bone: No Bone: No Epithelialization: None None Large (67-100%) Debridement: N/A Debridement - Excisional N/A Pre-procedure N/A 15:20 N/A Verification/Time Out Taken: Pain Control: N/A Other N/A Tissue Debrided: N/A Subcutaneous, Slough N/A Level: N/A Skin/Subcutaneous Tissue N/A Debridement Area (sq cm): N/A 0.3 N/A Instrument: N/A Curette N/A Bleeding: N/A Minimum N/A Hemostasis Achieved: N/A Pressure N/A Procedural Pain: N/A 0 N/A Post Procedural Pain: N/A 0 N/A Debridement Treatment N/A Procedure was tolerated well N/A Response: Post Debridement N/A 1.5x0.2x0.2 N/A Measurements L x W x D (cm) Post Debridement Volume: N/A 0.047 N/A (cm) Periwound Skin Texture: No Abnormalities Noted Excoriation: No No Abnormalities Noted Induration: No Callus: No Crepitus: No Rash: No Scarring: No Periwound Skin Moisture: Maceration: Yes Maceration: No No Abnormalities Noted Dry/Scaly: No Periwound Skin Color: No Abnormalities Noted Erythema: Yes No Abnormalities Noted Atrophie Blanche: No Cyanosis: No Ecchymosis: No Hemosiderin Staining: No Mottled: No Pallor: No Rubor: No Erythema Location: N/A Circumferential N/A Temperature: No Abnormality No Abnormality No Abnormality Tenderness on Palpation: Yes Yes Yes Wound Preparation: Ulcer Cleansing: Ulcer Cleansing: Ulcer Cleansing: Rinsed/Irrigated with Saline Rinsed/Irrigated with Saline Rinsed/Irrigated with Saline Topical Anesthetic  Applied: Topical Anesthetic Applied: Topical Anesthetic Applied: Other: lidocaine 4% None, Other: lidocaine 4% None Procedures Performed: N/A Debridement N/A Wound Number: [id number]  Photos: [name], [name] ([id number]) Wound Location: Left Lower Leg - Anterior Left Malleolus - Medial Right Lower Leg - Anterior Wounding Event: Gradually Appeared Blister Gradually Appeared Primary Etiology: Venous Leg Ulcer Venous Leg Ulcer Venous Leg Ulcer Comorbid History: Cataracts, Arrhythmia, Deep Cataracts, Arrhythmia, Deep Cataracts, Arrhythmia, Deep Vein Thrombosis, Vein Thrombosis, Vein Thrombosis, Osteoarthritis Osteoarthritis Osteoarthritis Date Acquired: 12/09/2017 10/28/2017 10/28/2017 Weeks of Treatment: 0 4 4 Wound Status: Open Open Open Measurements L x W x D 0.3x0.5x0.1 0.8x0.8x0.1 0.1x0.1x0.1 (cm) Area (cm) : 0.118 0.503 0.008 Volume (cm) : 0.012 0.05 0.001 % Reduction in Area: N/A -113.10% 95.90% % Reduction in Volume: N/A -108.30% 95.00% Classification: Full Thickness Without Full Thickness Without Full Thickness Without Exposed Support Structures Exposed Support Structures Exposed Support Structures Exudate Amount: Large Large None Present Exudate Type: Serosanguineous Serosanguineous N/A Exudate Color: red, brown red, brown N/A Wound Margin: Flat and Intact Flat and Intact Flat and  Intact Granulation Amount: Medium (34-66%) Medium (34-66%) None Present (0%) Granulation Quality: Red Red N/A Necrotic Amount: Medium (34-66%) Medium (34-66%) Large (67-100%) Necrotic Tissue: Adherent Slough Adherent Slough Eschar Exposed Structures: Fascia: No Fat Layer (Subcutaneous Fascia: No Fat Layer (Subcutaneous Tissue) Exposed: Yes Fat Layer (Subcutaneous Tissue) Exposed: No Fascia: No Tissue) Exposed: No Tendon: No Tendon: No Tendon: No Muscle: No Muscle: No Muscle: No Joint: No Joint: No Joint: No Bone: No Bone: No Bone: No Epithelialization: None None None Debridement:  Debridement - Excisional N/A N/A Pre-procedure 15:20 N/A N/A Verification/Time Out Taken: Pain Control: Other N/A N/A Tissue Debrided: Subcutaneous, Slough N/A N/A Level: Skin/Subcutaneous Tissue N/A N/A Debridement Area (sq cm): 0.15 N/A N/A Instrument: Curette N/A N/A Bleeding: Minimum N/A N/A Hemostasis Achieved: Pressure N/A N/A Procedural Pain: 0 N/A N/A Post Procedural Pain: 0 N/A N/A Debridement Treatment Procedure was tolerated well N/A N/A Response: Post Debridement 0.3x0.5x0.1 N/A N/A Measurements L x W x D (cm) Post Debridement Volume: 0.012 N/A N/A (cm) Periwound Skin Texture: No Abnormalities Noted Excoriation: No Excoriation: No Induration: No Induration: No Callus: No Callus: No Crepitus: No Crepitus: No [name], [name] ([id number]) Rash: No Rash: No Scarring: No Scarring: No Periwound Skin Moisture: Maceration: Yes Maceration: No Maceration: No Dry/Scaly: No Dry/Scaly: No Periwound Skin Color: Erythema: Yes Erythema: Yes Atrophie Blanche: No Atrophie Blanche: No Cyanosis: No Cyanosis: No Ecchymosis: No Ecchymosis: No Erythema: No Hemosiderin Staining: No Hemosiderin Staining: No Mottled: No Mottled: No Pallor: No Pallor: No Rubor: No Rubor: No Erythema Location: Circumferential Circumferential N/A Temperature: No Abnormality No Abnormality No Abnormality Tenderness on Palpation: Yes Yes Yes Wound Preparation: Ulcer Cleansing: Ulcer Cleansing: Ulcer Cleansing: Rinsed/Irrigated with Saline Rinsed/Irrigated with Saline Rinsed/Irrigated with Saline Topical Anesthetic Applied: Topical Anesthetic Applied: Topical Anesthetic Applied: Other: lidocaine 4% Other: lidocaine 4% Other: lidocaine 4% Procedures Performed: Debridement N/A N/A Treatment Notes Wound #10 (Right, Medial Malleolus) 1. Cleansed with: Clean wound with Normal Saline Cleanse wound with antibacterial soap and water 2. Anesthetic Topical Lidocaine 4% cream to  wound bed prior to debridement 3. Peri-wound Care: Moisturizing lotion 4. Dressing Applied: Other dressing (specify in notes) 5. Secondary Dressing Applied ABD Pad Foam 7. Secured with Tape 3 Layer Compression System - Bilateral Wound #11 (Left, Dorsal Foot) 1. Cleansed with: Clean wound with Normal Saline Cleanse wound with antibacterial soap and water 2. Anesthetic Topical Lidocaine 4% cream to wound bed prior to debridement 3. Peri-wound Care: Moisturizing lotion 4. Dressing Applied: Other dressing (specify in notes) 5. Secondary Dressing Applied ABD Pad Foam 7. Secured with Tape Klingberg, Lynda C. ([id number]) 3 Layer Compression System - Bilateral Wound #13 (Left, Anterior Lower Leg) 1. Cleansed with: Clean wound with Normal Saline Cleanse wound with antibacterial soap and water 2. Anesthetic Topical Lidocaine 4% cream to wound bed prior to debridement 3. Peri-wound Care: Moisturizing lotion 4. Dressing Applied: Other dressing (specify in notes) 5. Secondary Dressing Applied ABD Pad Foam 7. Secured with Tape 3 Layer Compression System - Bilateral Wound #5 (Left, Medial Malleolus) 1. Cleansed with: Clean wound with Normal Saline Cleanse wound with antibacterial soap and water 2. Anesthetic Topical Lidocaine 4% cream to wound bed prior to debridement 3. Peri-wound Care: Moisturizing lotion 4. Dressing Applied: Other dressing (specify in notes) 5. Secondary Dressing Applied ABD Pad Foam 7. Secured with Tape 3 Layer Compression System - Bilateral Wound #7 (Right, Anterior Lower Leg) 1. Cleansed with: Clean wound with Normal Saline Cleanse wound with antibacterial soap and water 2. Anesthetic Topical Lidocaine  4% cream to wound bed prior to debridement 3. Peri-wound Care: Moisturizing lotion 4. Dressing Applied: Other dressing (specify in notes) 5. Secondary Dressing Applied ABD Pad Foam 7. Secured with Tape 3 Layer Compression System -  Bilateral ANTWAIN, CALIENDO ([id number]) Electronic Signature(s) Signed: 12/09/2017 4:49:21 PM By: Baltazar Najjar MD Entered By: Baltazar Najjar on 12/09/2017 16:09:40 [name] ([id number]) -------------------------------------------------------------------------------- Multi-Disciplinary Care Plan Details Patient Name: [name] Date of Service: 12/09/2017 3:00 PM Medical Record Number: [id number] Patient Account Number: [id number] Date of Birth/Sex: 07/19/1929 (84 y.o. M) Treating RN: Huel Coventry Primary Care Anisa Leanos: Guerry Bruin Other Clinician: Referring Alizzon Dioguardi: Guerry Bruin Treating Linken Mcglothen/Extender: Altamese [place] in Treatment: 4 Active Inactive ` Abuse / Safety / Falls / Self Care Management Nursing Diagnoses: Impaired physical mobility Potential for falls Goals: Patient will not develop complications from immobility Date Initiated: 11/11/2017 Target Resolution Date: 12/09/2017 Goal Status: Active Patient will remain injury free related to falls Date Initiated: 11/11/2017 Target Resolution Date: 12/09/2017 Goal Status: Active Interventions: Assess fall risk on admission and as needed Notes: ` Nutrition Nursing Diagnoses: Potential for alteratiion in Nutrition/Potential for imbalanced nutrition Goals: Patient/caregiver agrees to and verbalizes understanding of need to use nutritional supplements and/or vitamins as prescribed Date Initiated: 11/11/2017 Target Resolution Date: 12/10/2017 Goal Status: Active Interventions: Assess patient nutrition upon admission and as needed per policy Notes: ` Orientation to the Wound Care Program Nursing Diagnoses: Knowledge deficit related to the wound healing center program Goals: Patient/caregiver will verbalize understanding of the [facility]aling [facility] [address] TYCEN, DOCKTER ([id number]) Date Initiated: 11/11/2017 Target Resolution Date: 12/10/2017 Goal Status: Active Interventions: Provide  education on orientation to the wound center Notes: ` Venous Leg Ulcer Nursing Diagnoses: Actual venous Insuffiency (use after diagnosis is confirmed) Goals: Non-invasive venous studies are completed as ordered Date Initiated: 11/11/2017 Target Resolution Date: 12/10/2017 Goal Status: Active Interventions: Assess peripheral edema status every visit. Treatment Activities: Non-invasive vascular studies : 11/11/2017 Notes: ` Wound/Skin Impairment Nursing Diagnoses: Impaired tissue integrity Goals: Ulcer/skin breakdown will have a volume reduction of 80% by week 12 Date Initiated: 11/11/2017 Target Resolution Date: 12/10/2017 Goal Status: Active Interventions: Assess patient/caregiver ability to obtain necessary supplies Provide education on ulcer and skin care Treatment Activities: Topical wound management initiated : 11/11/2017 Notes: Electronic Signature(s) Signed: 12/14/2017 9:08:37 AM By: Elliot Gurney, [name], RN, CWS, [name], [name] Entered By: Elliot Gurney, [name], RN, CWS, Kim on 12/09/2017 15:27:07 [name] ([id number]) -------------------------------------------------------------------------------- Pain Assessment Details Patient Name: Donald Musca C. Date of Service: 12/09/2017 3:00 PM Medical Record Number: [id number] Patient Account Number: [id number] Date of Birth/Sex: February 16, 1929 (84 y.o. M) Treating RN: Phillis Haggis Primary Care Rahiem Schellinger: Guerry Bruin Other Clinician: Referring Donnah Levert: Guerry Bruin Treating Jermya Dowding/Extender: Altamese  in Treatment: 4 Active Problems Location of Pain Severity and Description of Pain Patient Has Paino No Site Locations Pain Management and Medication Current Pain Management: Electronic Signature(s) Signed: 12/10/2017 4:32:30 PM By: Alejandro Mulling Entered By: Alejandro Mulling on 12/09/2017 14:59:56 [name]  ([id number]) -------------------------------------------------------------------------------- Patient/Caregiver Education Details Patient Name: [name]. Date of Service: 12/09/2017 3:00 PM Medical Record Number: [id number] Patient Account Number: [id number] Date of Birth/Gender: 1929/06/02 (84 y.o. M) Treating RN: Phillis Haggis Primary Care Physician: Guerry Bruin Other Clinician: Referring Physician: Guerry Bruin Treating Physician/Extender: Altamese  in Treatment: 4 Education Assessment Education Provided To: Patient Education Topics Provided Wound/Skin Impairment: Handouts: Caring for Your Ulcer, Other: change dressing as ordered Methods: Demonstration, Explain/Verbal Responses: State content correctly  Electronic Signature(s) Signed: 12/10/2017 4:32:30 PM By: Alejandro MullingPinkerton, [name] Entered By: Alejandro MullingPinkerton, [name] on 12/09/2017 15:48:31 Donald MouseWILLIFORD, Conn C. ([id number]) -------------------------------------------------------------------------------- Wound Assessment Details Patient Name: Donald MuscaWILLIFORD, [name] Date of Service: 12/09/2017 3:00 PM Medical Record Number: [id number] Patient Account Number: [id number] Date of Birth/Sex: 12/17/1928 (84 y.o. M) Treating RN: Phillis HaggisPinkerton, Debi Primary Care Khyla Mccumbers: Guerry BruinISOVEC, RICHARD Other Clinician: Referring Aseneth Hack: Guerry BruinISOVEC, RICHARD Treating Leighana Neyman/Extender: Altamese CarolinaOBSON, MICHAEL G Weeks in Treatment: 4 Wound Status Wound Number: 10 Primary Venous Leg Ulcer Etiology: Wound Location: Right Malleolus - Medial Wound Status: Open Wounding Event: Gradually Appeared Comorbid Cataracts, Arrhythmia, Deep Vein Date Acquired: 12/02/2017 History: Thrombosis, Osteoarthritis Weeks Of Treatment: 1 Clustered Wound: No Photos Photo Uploaded By: Alejandro MullingPinkerton, [name] on 12/09/2017 15:29:23 Wound Measurements Length: (cm) 1.4 Width: (cm) 0.5 Depth: (cm) 0.1 Area: (cm) 0.55 Volume: (cm) 0.055 % Reduction in Area: 69.5% % Reduction  in Volume: 69.6% Epithelialization: None Tunneling: No Undermining: No Wound Description Full Thickness Without Exposed Support Classification: Structures Wound Margin: Distinct, outline attached Exudate Large Amount: Exudate Type: Serosanguineous Exudate Color: red, brown Foul Odor After Cleansing: No Slough/Fibrino Yes Wound Bed Granulation Amount: Large (67-100%) Exposed Structure Granulation Quality: Red Fascia Exposed: No Necrotic Amount: Small (1-33%) Fat Layer (Subcutaneous Tissue) Exposed: No Necrotic Quality: Adherent Slough Tendon Exposed: No Muscle Exposed: No Joint Exposed: No Bone Exposed: No [name], [name] ([id number]) Periwound Skin Texture Texture Color No Abnormalities Noted: No No Abnormalities Noted: No Moisture Temperature / Pain No Abnormalities Noted: No Temperature: No Abnormality Maceration: Yes Tenderness on Palpation: Yes Wound Preparation Ulcer Cleansing: Rinsed/Irrigated with Saline Topical Anesthetic Applied: Other: lidocaine 4%, Treatment Notes Wound #10 (Right, Medial Malleolus) 1. Cleansed with: Clean wound with Normal Saline Cleanse wound with antibacterial soap and water 2. Anesthetic Topical Lidocaine 4% cream to wound bed prior to debridement 3. Peri-wound Care: Moisturizing lotion 4. Dressing Applied: Other dressing (specify in notes) 5. Secondary Dressing Applied ABD Pad Foam 7. Secured with Tape 3 Layer Compression System - Bilateral Electronic Signature(s) Signed: 12/10/2017 4:32:30 PM By: Alejandro MullingPinkerton, [name] Entered By: Alejandro MullingPinkerton, [name] on 12/09/2017 15:11:18 Donald MouseWILLIFORD, [name] ([id number]) -------------------------------------------------------------------------------- Wound Assessment Details Patient Name: Donald MuscaWILLIFORD, [name] Date of Service: 12/09/2017 3:00 PM Medical Record Number: [id number] Patient Account Number: [id number] Date of Birth/Sex: 03/18/1929 (84 y.o. M) Treating RN: Phillis HaggisPinkerton, Debi Primary Care  Trammell Bowden: Guerry BruinISOVEC, RICHARD Other Clinician: Referring Kavina Cantave: Guerry BruinISOVEC, RICHARD Treating Keiri Solano/Extender: Altamese CarolinaOBSON, MICHAEL G Weeks in Treatment: 4 Wound Status Wound Number: 11 Primary To be determined Etiology: Wound Location: Left Foot - Dorsal Wound Status: Open Wounding Event: Gradually Appeared Comorbid Cataracts, Arrhythmia, Deep Vein Date Acquired: 12/02/2017 History: Thrombosis, Osteoarthritis Weeks Of Treatment: 1 Clustered Wound: No Photos Photo Uploaded By: Alejandro MullingPinkerton, [name] on 12/09/2017 15:29:58 Wound Measurements Length: (cm) 1.5 Width: (cm) 0.2 Depth: (cm) 0.1 Area: (cm) 0.236 Volume: (cm) 0.024 % Reduction in Area: 6% % Reduction in Volume: 4% Epithelialization: None Tunneling: No Undermining: No Wound Description Full Thickness Without Exposed Support Classification: Structures Wound Margin: Flat and Intact Exudate Large Amount: Exudate Type: Serous Exudate Color: amber Foul Odor After Cleansing: No Slough/Fibrino Yes Wound Bed Granulation Amount: Large (67-100%) Exposed Structure Granulation Quality: Red Fascia Exposed: No Necrotic Amount: Small (1-33%) Fat Layer (Subcutaneous Tissue) Exposed: No Necrotic Quality: Adherent Slough Tendon Exposed: No Muscle Exposed: No Joint Exposed: No Bone Exposed: No Asbury, Jesse C. ([id number]) Periwound Skin Texture Texture Color No Abnormalities Noted: No No Abnormalities Noted: No Callus: No Atrophie Blanche: No Crepitus: No Cyanosis: No Excoriation: No Ecchymosis: No Induration: No  Erythema: Yes Rash: No Erythema Location: Circumferential Scarring: No Hemosiderin Staining: No Mottled: No Moisture Pallor: No No Abnormalities Noted: No Rubor: No Dry / Scaly: No Maceration: No Temperature / Pain Temperature: No Abnormality Tenderness on Palpation: Yes Wound Preparation Ulcer Cleansing: Rinsed/Irrigated with Saline Topical Anesthetic Applied: None, Other: lidocaine  4%, Treatment Notes Wound #11 (Left, Dorsal Foot) 1. Cleansed with: Clean wound with Normal Saline Cleanse wound with antibacterial soap and water 2. Anesthetic Topical Lidocaine 4% cream to wound bed prior to debridement 3. Peri-wound Care: Moisturizing lotion 4. Dressing Applied: Other dressing (specify in notes) 5. Secondary Dressing Applied ABD Pad Foam 7. Secured with Tape 3 Layer Compression System - Bilateral Electronic Signature(s) Signed: 12/10/2017 4:32:30 PM By: Alejandro Mulling Entered By: Alejandro Mulling on 12/09/2017 15:14:30 [name] ([id number]) -------------------------------------------------------------------------------- Wound Assessment Details Patient Name: Donald Musca C. Date of Service: 12/09/2017 3:00 PM Medical Record Number: [id number] Patient Account Number: [id number] Date of Birth/Sex: March 07, 1929 (84 y.o. M) Treating RN: Phillis Haggis Primary Care Lyrah Bradt: Guerry Bruin Other Clinician: Referring Indiah Heyden: Guerry Bruin Treating Dmiya Malphrus/Extender: [employer] [place] in Treatment: 4 Wound Status Wound Number: 12 Primary To be determined Etiology: Wound Location: Right Knee Wound Status: Healed - Epithelialized Wounding Event: Gradually Appeared Comorbid Cataracts, Arrhythmia, Deep Vein Date Acquired: 12/02/2017 History: Thrombosis, Osteoarthritis Weeks Of Treatment: 1 Clustered Wound: No Photos Photo Uploaded By: Alejandro Mulling on 12/09/2017 15:29:58 Wound Measurements Length: (cm) 0 Width: (cm) 0 Depth: (cm) 0 Area: (cm) 0 Volume: (cm) 0 % Reduction in Area: 100% % Reduction in Volume: 100% Epithelialization: Large (67-100%) Tunneling: No Undermining: No Wound Description Full Thickness Without Exposed Support Classification: Structures Wound Margin: Distinct, outline attached Exudate None Present Amount: Foul Odor After Cleansing: No Slough/Fibrino No Wound Bed Granulation Amount: None  Present (0%) Exposed Structure Necrotic Amount: None Present (0%) Fascia Exposed: No Fat Layer (Subcutaneous Tissue) Exposed: No Tendon Exposed: No Muscle Exposed: No Joint Exposed: No Bone Exposed: No Periwound Skin Texture Texture Color Cohron, Sankalp C. ([id number]) No Abnormalities Noted: No No Abnormalities Noted: No Moisture Temperature / Pain No Abnormalities Noted: No Temperature: No Abnormality Tenderness on Palpation: Yes Wound Preparation Ulcer Cleansing: Rinsed/Irrigated with Saline Topical Anesthetic Applied: None Electronic Signature(s) Signed: 12/10/2017 4:32:30 PM By: Alejandro Mulling Entered By: Alejandro Mulling on 12/09/2017 15:08:47 [name] ([id number]) -------------------------------------------------------------------------------- Wound Assessment Details Patient Name: Donald Musca C. Date of Service: 12/09/2017 3:00 PM Medical Record Number: [id number] Patient Account Number: [id number] Date of Birth/Sex: 1928/09/17 (84 y.o. M) Treating RN: Phillis Haggis Primary Care Alawna Graybeal: Guerry Bruin Other Clinician: Referring Shealee Yordy: Guerry Bruin Treating Graclyn Lawther/Extender: [employer] [place] in Treatment: 4 Wound Status Wound Number: 13 Primary Venous Leg Ulcer Etiology: Wound Location: Left Lower Leg - Anterior Wound Status: Open Wounding Event: Gradually Appeared Comorbid Cataracts, Arrhythmia, Deep Vein Date Acquired: 12/09/2017 History: Thrombosis, Osteoarthritis Weeks Of Treatment: 0 Clustered Wound: No Photos Photo Uploaded By: Alejandro Mulling on 12/09/2017 15:30:34 Wound Measurements Length: (cm) 0.3 Width: (cm) 0.5 Depth: (cm) 0.1 Area: (cm) 0.118 Volume: (cm) 0.012 % Reduction in Area: % Reduction in Volume: Epithelialization: None Tunneling: No Undermining: No Wound Description Full Thickness Without Exposed Support Classification: Structures Wound Margin: Flat and  Intact Exudate Large Amount: Exudate Type: Serosanguineous Exudate Color: red, brown Foul Odor After Cleansing: No Slough/Fibrino Yes Wound Bed Granulation Amount: Medium (34-66%) Exposed Structure Granulation Quality: Red Fascia Exposed: No Necrotic Amount: Medium (34-66%) Fat Layer (Subcutaneous Tissue) Exposed: No Necrotic Quality: Adherent Slough Tendon Exposed: No Muscle  Exposed: No Joint Exposed: No Bone Exposed: No [name], [name] ([id number]) Periwound Skin Texture Texture Color No Abnormalities Noted: No No Abnormalities Noted: No Erythema: Yes Moisture Erythema Location: Circumferential No Abnormalities Noted: No Maceration: Yes Temperature / Pain Temperature: No Abnormality Tenderness on Palpation: Yes Wound Preparation Ulcer Cleansing: Rinsed/Irrigated with Saline Topical Anesthetic Applied: Other: lidocaine 4%, Treatment Notes Wound #13 (Left, Anterior Lower Leg) 1. Cleansed with: Clean wound with Normal Saline Cleanse wound with antibacterial soap and water 2. Anesthetic Topical Lidocaine 4% cream to wound bed prior to debridement 3. Peri-wound Care: Moisturizing lotion 4. Dressing Applied: Other dressing (specify in notes) 5. Secondary Dressing Applied ABD Pad Foam 7. Secured with Tape 3 Layer Compression System - Bilateral Electronic Signature(s) Signed: 12/10/2017 4:32:30 PM By: Alejandro Mulling Entered By: Alejandro Mulling on 12/09/2017 15:16:59 [name] ([id number]) -------------------------------------------------------------------------------- Wound Assessment Details Patient Name: Donald Musca C. Date of Service: 12/09/2017 3:00 PM Medical Record Number: [id number] Patient Account Number: [id number] Date of Birth/Sex: [birth year] (84 y.o. M) Treating RN: Phillis Haggis Primary Care Bird Swetz: Guerry Bruin Other Clinician: Referring Waylynn Benefiel: Guerry Bruin Treating Butler Vegh/Extender: [employer] [place] in  Treatment: 4 Wound Status Wound Number: 5 Primary Venous Leg Ulcer Etiology: Wound Location: Left Malleolus - Medial Wound Status: Open Wounding Event: Blister Comorbid Cataracts, Arrhythmia, Deep Vein Date Acquired: 10/28/2017 History: Thrombosis, Osteoarthritis Weeks Of Treatment: 4 Clustered Wound: No Photos Photo Uploaded By: Alejandro Mulling on 12/09/2017 15:30:34 Wound Measurements Length: (cm) 0.8 Width: (cm) 0.8 Depth: (cm) 0.1 Area: (cm) 0.503 Volume: (cm) 0.05 % Reduction in Area: -113.1% % Reduction in Volume: -108.3% Epithelialization: None Tunneling: No Undermining: No Wound Description Full Thickness Without Exposed Support Classification: Structures Wound Margin: Flat and Intact Exudate Large Amount: Exudate Type: Serosanguineous Exudate Color: red, brown Foul Odor After Cleansing: No Slough/Fibrino Yes Wound Bed Granulation Amount: Medium (34-66%) Exposed Structure Granulation Quality: Red Fascia Exposed: No Necrotic Amount: Medium (34-66%) Fat Layer (Subcutaneous Tissue) Exposed: Yes Necrotic Quality: Adherent Slough Tendon Exposed: No Muscle Exposed: No Joint Exposed: No Bone Exposed: No Latouche, Zavon C. ([id number]) Periwound Skin Texture Texture Color No Abnormalities Noted: No No Abnormalities Noted: No Callus: No Atrophie Blanche: No Crepitus: No Cyanosis: No Excoriation: No Ecchymosis: No Induration: No Erythema: Yes Rash: No Erythema Location: Circumferential Scarring: No Hemosiderin Staining: No Mottled: No Moisture Pallor: No No Abnormalities Noted: No Rubor: No Dry / Scaly: No Maceration: No Temperature / Pain Temperature: No Abnormality Tenderness on Palpation: Yes Wound Preparation Ulcer Cleansing: Rinsed/Irrigated with Saline Topical Anesthetic Applied: Other: lidocaine 4%, Treatment Notes Wound #5 (Left, Medial Malleolus) 1. Cleansed with: Clean wound with Normal Saline Cleanse wound with  antibacterial soap and water 2. Anesthetic Topical Lidocaine 4% cream to wound bed prior to debridement 3. Peri-wound Care: Moisturizing lotion 4. Dressing Applied: Other dressing (specify in notes) 5. Secondary Dressing Applied ABD Pad Foam 7. Secured with Tape 3 Layer Compression System - Bilateral Electronic Signature(s) Signed: 12/10/2017 4:32:30 PM By: Alejandro Mulling Entered By: Alejandro Mulling on 12/09/2017 15:13:17 [name] ([id number]) -------------------------------------------------------------------------------- Wound Assessment Details Patient Name: Donald Musca C. Date of Service: 12/09/2017 3:00 PM Medical Record Number: [id number] Patient Account Number: [id number] Date of Birth/Sex: [birth year] (84 y.o. M) Treating RN: Phillis Haggis Primary Care Creola Krotz: Guerry Bruin Other Clinician: Referring Candice Lunney: Guerry Bruin Treating Deniz Eskridge/Extender: [employer] [place] in Treatment: 4 Wound Status Wound Number: 7 Primary Venous Leg Ulcer Etiology: Wound Location: Right Lower Leg - Anterior Wound Status: Open Wounding Event: Gradually Appeared  Comorbid Cataracts, Arrhythmia, Deep Vein Date Acquired: 10/28/2017 History: Thrombosis, Osteoarthritis Weeks Of Treatment: 4 Clustered Wound: No Photos Photo Uploaded By: Alejandro Mulling on 12/09/2017 15:30:53 Wound Measurements Length: (cm) 0.1 Width: (cm) 0.1 Depth: (cm) 0.1 Area: (cm) 0.008 Volume: (cm) 0.001 % Reduction in Area: 95.9% % Reduction in Volume: 95% Epithelialization: None Tunneling: No Undermining: No Wound Description Full Thickness Without Exposed Support Classification: Structures Wound Margin: Flat and Intact Exudate None Present Amount: Foul Odor After Cleansing: No Slough/Fibrino No Wound Bed Granulation Amount: None Present (0%) Exposed Structure Necrotic Amount: Large (67-100%) Fascia Exposed: No Necrotic Quality: Eschar Fat Layer (Subcutaneous  Tissue) Exposed: No Tendon Exposed: No Muscle Exposed: No Joint Exposed: No Bone Exposed: No Periwound Skin Texture Texture Color Beecher, [place] C. ([id number]) No Abnormalities Noted: No No Abnormalities Noted: No Callus: No Atrophie Blanche: No Crepitus: No Cyanosis: No Excoriation: No Ecchymosis: No Induration: No Erythema: No Rash: No Hemosiderin Staining: No Scarring: No Mottled: No Pallor: No Moisture Rubor: No No Abnormalities Noted: No Dry / Scaly: No Temperature / Pain Maceration: No Temperature: No Abnormality Tenderness on Palpation: Yes Wound Preparation Ulcer Cleansing: Rinsed/Irrigated with Saline Topical Anesthetic Applied: Other: lidocaine 4%, Treatment Notes Wound #7 (Right, Anterior Lower Leg) 1. Cleansed with: Clean wound with Normal Saline Cleanse wound with antibacterial soap and water 2. Anesthetic Topical Lidocaine 4% cream to wound bed prior to debridement 3. Peri-wound Care: Moisturizing lotion 4. Dressing Applied: Other dressing (specify in notes) 5. Secondary Dressing Applied ABD Pad Foam 7. Secured with Tape 3 Layer Compression System - Bilateral Electronic Signature(s) Signed: 12/10/2017 4:32:30 PM By: Alejandro Mulling Entered By: Alejandro Mulling on 12/09/2017 15:10:09 [name] ([id number]) -------------------------------------------------------------------------------- Vitals Details Patient Name: Donald Musca C. Date of Service: 12/09/2017 3:00 PM Medical Record Number: [id number] Patient Account Number: [id number] Date of Birth/Sex: [birth year] (84 y.o. M) Treating RN: Phillis Haggis Primary Care Sadaf Przybysz: Guerry Bruin Other Clinician: Referring Vanden Fawaz: Guerry Bruin Treating Kenedee Molesky/Extender: [employer] [place] in Treatment: 4 Vital Signs Time Taken: 15:00 Pulse (bpm): 74 Height (in): 68 Respiratory Rate (breaths/min): 16 Weight (lbs): 150 Blood Pressure (mmHg): 116/64 Body Mass Index  (BMI): 22.8 Reference Range: 80 - 120 mg / dl Electronic Signature(s) Signed: 12/10/2017 4:32:30 PM By: Alejandro Mulling Entered By: Alejandro Mulling on 12/09/2017 15:02:39

## 2017-12-15 ENCOUNTER — Telehealth: Payer: Self-pay | Admitting: *Deleted

## 2017-12-15 DIAGNOSIS — I8290 Acute embolism and thrombosis of unspecified vein: Secondary | ICD-10-CM | POA: Diagnosis not present

## 2017-12-15 DIAGNOSIS — I482 Chronic atrial fibrillation: Secondary | ICD-10-CM | POA: Diagnosis not present

## 2017-12-15 DIAGNOSIS — Z79899 Other long term (current) drug therapy: Secondary | ICD-10-CM | POA: Diagnosis not present

## 2017-12-15 NOTE — Telephone Encounter (Signed)
LMOVM requesting call back regarding [employer] monitor troubleshooting.  Will determine if [employer] has contacted the patient yet (electronic request submitted 3/28).

## 2017-12-16 ENCOUNTER — Encounter: Payer: Medicare Other | Admitting: Internal Medicine

## 2017-12-16 DIAGNOSIS — L97221 Non-pressure chronic ulcer of left calf limited to breakdown of skin: Secondary | ICD-10-CM | POA: Diagnosis not present

## 2017-12-16 DIAGNOSIS — L97829 Non-pressure chronic ulcer of other part of left lower leg with unspecified severity: Secondary | ICD-10-CM | POA: Diagnosis not present

## 2017-12-16 DIAGNOSIS — I87333 Chronic venous hypertension (idiopathic) with ulcer and inflammation of bilateral lower extremity: Secondary | ICD-10-CM | POA: Diagnosis not present

## 2017-12-16 DIAGNOSIS — Z7901 Long term (current) use of anticoagulants: Secondary | ICD-10-CM | POA: Diagnosis not present

## 2017-12-16 DIAGNOSIS — I771 Stricture of artery: Secondary | ICD-10-CM | POA: Diagnosis not present

## 2017-12-16 DIAGNOSIS — L97312 Non-pressure chronic ulcer of right ankle with fat layer exposed: Secondary | ICD-10-CM | POA: Diagnosis not present

## 2017-12-16 DIAGNOSIS — L03115 Cellulitis of right lower limb: Secondary | ICD-10-CM | POA: Diagnosis not present

## 2017-12-16 DIAGNOSIS — I872 Venous insufficiency (chronic) (peripheral): Secondary | ICD-10-CM | POA: Diagnosis not present

## 2017-12-16 DIAGNOSIS — L97529 Non-pressure chronic ulcer of other part of left foot with unspecified severity: Secondary | ICD-10-CM | POA: Diagnosis not present

## 2017-12-16 DIAGNOSIS — L97819 Non-pressure chronic ulcer of other part of right lower leg with unspecified severity: Secondary | ICD-10-CM | POA: Diagnosis not present

## 2017-12-16 DIAGNOSIS — L97211 Non-pressure chronic ulcer of right calf limited to breakdown of skin: Secondary | ICD-10-CM | POA: Diagnosis not present

## 2017-12-16 DIAGNOSIS — L97322 Non-pressure chronic ulcer of left ankle with fat layer exposed: Secondary | ICD-10-CM | POA: Diagnosis not present

## 2017-12-17 NOTE — Progress Notes (Addendum)
[name], KINGSLEY ([id number]) Visit Report for 12/16/2017 Debridement Details Patient Name: [name], [name]. Date of Service: 12/16/2017 12:45 PM Medical Record Number: [id number] Patient Account Number: [id number] Date of Birth/Sex: 01/03/1929 (84 y.o. Male) Treating RN: Huel Coventry Primary Care Provider: Guerry Bruin Other Clinician: Referring Provider: Guerry Bruin Treating Provider/Extender: Altamese [place] in Treatment: 5 Debridement Performed for Wound #10 Right,Medial Malleolus Assessment: Performed By: Physician Maxwell Caul, MD Debridement Type: Chemical/Enzymatic/Mechanical Agent Used: Other Severity of Tissue Pre Fat layer exposed Debridement: Pre-procedure Verification/Time Yes - 13:00 Out Taken: Start Time: 13:01 Pain Control: Other : lidocaine 4% Instrument: Other : saline and gauze Bleeding: Minimum Hemostasis Achieved: Pressure End Time: 13:05 Procedural Pain: 2 Post Procedural Pain: 1 Response to Treatment: Procedure was tolerated well Post Debridement Measurements of Total Wound Length: (cm) 0.8 Width: (cm) 1.4 Depth: (cm) 0.1 Volume: (cm) 0.088 Character of Wound/Ulcer Post Debridement: Stable Severity of Tissue Post Debridement: Limited to breakdown of skin Post Procedure Diagnosis Same as Pre-procedure Electronic Signature(s) Signed: 12/17/2017 2:16:57 PM By: Elliot Gurney, [name], RN, CWS, [name], [name] Signed: 12/17/2017 4:16:31 PM By: Baltazar Najjar MD Entered By: Elliot Gurney, [name], RN, CWS, Kim on 12/17/2017 14:16:56 [name], [name] ([id number]) -------------------------------------------------------------------------------- HPI Details Patient Name: [name]. Date of Service: 12/16/2017 12:45 PM Medical Record Number: [id number] Patient Account Number: [id number] Date of Birth/Sex: [birth year] (84 y.o. Male) Treating RN: Huel Coventry Primary Care Provider: Guerry Bruin Other Clinician: Referring Provider: Guerry Bruin Treating Provider/Extender: Altamese [place] in Treatment: 5 History of Present Illness HPI Description: Pleasant 84 year old with history of chronic venous insufficiency, remote history of DVT, atrial fibrillation (on Coumadin). No history of diabetes. He says that he developed worsening left lower extremity swelling, blisters, and subsequent ulcerations in late October 2016. He was started on Augmentin for left lower extremity cellulitis, which has improved. Arterial ultrasound 07/27/2015 showed no significant peripheral arterial disease. Awaiting venous ultrasound. Performing dressing changes with silver alginate and using a Tubigrip for edema control. He denies any significant pain. No claudication or rest pain. Ambulating per his baseline with a walker. No fever or chills. Moderate drainage. 10/10/15; the patient arrives back today having not been seen in almost 2 months. He has no open wounds on his legs. He has a history of venous insufficiency, arterial insufficiency. He has chronic Coumadin use secondary to atrial fibrillation and a remote history of DVT. Fortunately as he has not been here in 2 months we were not able to order him stockings. He is not able to get on standard compression hose. Juxtalite stockings would cost him probably about $100 appear, the couple was not able to afford this. 11/11/17 READMISSION This is an 84 year old man who is not a diabetic. He does have a history of sick sinus syndrome atrial fibrillation and has a cardiac pacemaker and is on Coumadin.he also has a history of DVT. His wife states that episodically over the last 2 years he has had wounds on his lower extremities but they have healed with home measures. Roughly 2-3 weeks ago the legs became a lot more swollen and he has had draining areas on the right anterior medial and lateral and the left medial leg. The patient's wife states that they were told to put support stockings on him but  they are concerned because they worsen the blisters. this bit difficult to get the history but they think the edema has been increasing over the last several months. 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 He does not complain of chest pain orthopnea. He does not have a history of kidney issues that he is aware. Primary doctor at [facility]. He is not on diuretics. He was here 2 years ago. He was noted to have edema in his legs at that time. He had an ultrasound of his legs which I'll need to review. There was discussion about compression stockings but he is complaining clearly not using them. ABI in the right in this clinic was 1.37, 1.07 on the left 11/18/17; this is an elderly frail man who has bilateral small lower extremity wounds in the setting of very significant multifactorial edema. He probably has chronic venous insufficiency with lymphedema but it is clearly fluid overloaded last week. He apparently has started on diuretics through his primary physician at [facility] but the patient's wife is not sure which one but states that he goes to the bathroom every 15 minutes. He is tolerating compression wraps. His wife stated that they charged him full price stating that he didn't have open wounds on his legs that would cover wraparound stockings. His edema is much better controlled 11/25/17-he is here in follow-up evaluation for bilateral lower extremity ulcers. He is tolerating compression therapy. There was one episode since last appointment with copious amounts of drainage from the right medial malleolus. We will add additional absorptive dressing, otherwise no change in treatment plan and he will follow-up next week 12/02/17; since I last saw this man is edema control is a lot better and I think this is a combination of diuretic therapy and are 3 layer compression. He has severe bilateral venous insufficiency with hemosiderin  deposition and likely chronic stasis dermatitis/inflammation. In spite of the edema control he has several weeping areas on both legs including the left lateral leg, area on the right posterior leg. He also has an excoriated area just below his right tibial tuberosity. Fortuitously the patient has a follow-up with cardiology tomorrow 12/09/17; his edema control is excellent now on 40 mg of Lasix on his cardiologist last week and according to his wife was thoroughly evaluated including checking his pacemaker and no medication changes are made. He has good edema control in his lower legs. He has significant chronic venous insufficiency with hemosiderin physician as well as stasis dermatitis. major areas on the medial malleoli Biggins, Javiel Potts. ([id number]) 12/16/17; he has excellent edema control however he is still having a lot of drainage and a lot of discomfort on the medial part of his right ankle/calf. The only other wound of any substances on the left medial malleolus. He is not been systemically unwell. Electronic Signature(s) Signed: 12/16/2017 5:02:04 PM By: Baltazar Najjar MD Entered By: Baltazar Najjar on 12/16/2017 13:26:57 [name] ([id number]) -------------------------------------------------------------------------------- Physical Exam Details Patient Name: [name]. Date of Service: 12/16/2017 12:45 PM Medical Record Number: [id number] Patient Account Number: [id number] Date of Birth/Sex: 1928-12-26 (84 y.o. Male) Treating RN: Huel Coventry Primary Care Provider: Guerry Bruin Other Clinician: Referring Provider: Guerry Bruin Treating Provider/Extender: Maxwell Caul [place] in Treatment: 5 Constitutional Sitting or standing Blood Pressure is within target range for patient.. Pulse regular and within target range for patient.Marland Kitchen Respirations regular, non-labored and within target range.. Temperature is normal and within the target range for the  patient.Marland Kitchen appears in no distress. Eyes Conjunctivae clear. No discharge. Respiratory Respiratory effort is easy and symmetric bilaterally. Rate is normal at rest and on room air.Marland Kitchen  Bilateral breath sounds are clear and equal in all lobes with no wheezes, rales or rhonchi.. Cardiovascular Heart rhythm and rate regular, without murmur or gallop.. Musculoskeletal no involvement of the ankle is seen no fluid. There is marked tenderness around the wound areas.. Integumentary (Hair, Skin) severe bilateral chronic venous insufficiency. There is some erythema on the medial right ankle. Psychiatric No evidence of depression, anxiety, or agitation. Calm, cooperative, and communicative. Appropriate interactions and affect.. Notes wound exam oOpen areas predominantly now include the right medial ankle/malleolus. Weeping edema coming fluid coming out of this even though there is not a lot of edema in the area. Is bit difficult to be certain because of the coexistent hemosiderin deposition but I think there is erythema and there is certainly marked tenderness compatible with cellulitis oThe only other area is actually the left medial malleolus. This looks fairly benign him not sure the dimension changes Electronic Signature(s) Signed: 12/16/2017 5:02:04 PM By: Baltazar Najjar MD Entered By: Baltazar Najjar on 12/16/2017 13:29:11 [name] ([id number]) -------------------------------------------------------------------------------- Physician Orders Details Patient Name: [name]. Date of Service: 12/16/2017 12:45 PM Medical Record Number: [id number] Patient Account Number: [id number] Date of Birth/Sex: 1928/10/14 (84 y.o. Male) Treating RN: Huel Coventry Primary Care Provider: Guerry Bruin Other Clinician: Referring Provider: Guerry Bruin Treating Provider/Extender: Altamese [place] in Treatment: 5 Verbal / Phone Orders: No Diagnosis Coding Wound Cleansing Wound #10  Right,Medial Malleolus o Cleanse wound with mild soap and water - HHRN please wash legs Wound #13 Left,Anterior Lower Leg o Cleanse wound with mild soap and water - HHRN please wash legs Wound #5 Left,Medial Malleolus o Cleanse wound with mild soap and water - HHRN please wash legs Anesthetic (add to Medication List) Wound #10 Right,Medial Malleolus o Topical Lidocaine 4% cream applied to wound bed prior to debridement (In Clinic Only). Wound #13 Left,Anterior Lower Leg o Topical Lidocaine 4% cream applied to wound bed prior to debridement (In Clinic Only). Wound #5 Left,Medial Malleolus o Topical Lidocaine 4% cream applied to wound bed prior to debridement (In Clinic Only). Skin Barriers/Peri-Wound Care Wound #10 Right,Medial Malleolus o Moisturizing lotion - HHRN to moisturize o Other: - OTC Lotrimin Cream on top of right foot. Wound #13 Left,Anterior Lower Leg o Moisturizing lotion - HHRN to moisturize o Other: - OTC Lotrimin Cream on top of right foot. Wound #5 Left,Medial Malleolus o Moisturizing lotion - HHRN to moisturize o Other: - OTC Lotrimin Cream on top of right foot. Primary Wound Dressing Wound #10 Right,Medial Malleolus o Silver Alginate Wound #13 Left,Anterior Lower Leg o Silver Alginate Wound #5 Left,Medial Malleolus o Silver Alginate Secondary Dressing [name], [name]. ([id number]) Wound #10 Right,Medial Malleolus o ABD pad - cover all wounds o Foam - Reddened area at top of wraps bilaterally o Drawtex - if needed for drainage Wound #13 Left,Anterior Lower Leg o ABD pad - cover all wounds o Foam - Reddened area at top of wraps bilaterally o Drawtex - if needed for drainage Wound #5 Left,Medial Malleolus o ABD pad - cover all wounds o Foam - Reddened area at top of wraps bilaterally o Drawtex - if needed for drainage Dressing Change Frequency Wound #10 Right,Medial Malleolus o Change Dressing Monday,  Wednesday, Friday Wound #13 Left,Anterior Lower Leg o Change Dressing Monday, Wednesday, Friday Wound #5 Left,Medial Malleolus o Change Dressing Monday, Wednesday, Friday Follow-up Appointments o Return Appointment in 1 week. Edema Control Wound #10 Right,Medial Malleolus o 3 Layer Compression System - Bilateral - Please  do not place coban directly on legs or roll down tops of wraps. o Elevate legs to the level of the heart and pump ankles as often as possible Wound #13 Left,Anterior Lower Leg o 3 Layer Compression System - Bilateral - Please do not place coban directly on legs or roll down tops of wraps. o Elevate legs to the level of the heart and pump ankles as often as possible Wound #5 Left,Medial Malleolus o 3 Layer Compression System - Bilateral - Please do not place coban directly on legs or roll down tops of wraps. o Elevate legs to the level of the heart and pump ankles as often as possible Additional Orders / Instructions Wound #10 Right,Medial Malleolus o Vitamin A; Vitamin Potts, Zinc o Activity as tolerated Wound #13 Left,Anterior Lower Leg o Vitamin A; Vitamin Potts, Zinc o Activity as tolerated Wound #5 Left,Medial Malleolus o Vitamin A; Vitamin Potts, Zinc o Activity as tolerated Home Health Wound #10 Right,Medial Malleolus MAYO, FAULK ([id number]) o Continue Home Health Visits o Home Health Nurse may visit PRN to address patientos wound care needs. o FACE TO FACE ENCOUNTER: MEDICARE and MEDICAID PATIENTS: I certify that this patient is under my care and that I had a face-to-face encounter that meets the physician face-to-face encounter requirements with this patient on this date. The encounter with the patient was in whole or in part for the following MEDICAL CONDITION: (primary reason for Home Healthcare) MEDICAL NECESSITY: I certify, that based on my findings, NURSING services are a medically necessary home health service. HOME  BOUND STATUS: I certify that my clinical findings support that this patient is homebound (i.e., Due to illness or injury, pt requires aid of supportive devices such as crutches, cane, wheelchairs, walkers, the use of special transportation or the assistance of another person to leave their place of residence. There is a normal inability to leave the home and doing so requires considerable and taxing effort. Other absences are for medical reasons / religious services and are infrequent or of short duration when for other reasons). o If current dressing causes regression in wound condition, may D/Potts ordered dressing product/s and apply Normal Saline Moist Dressing daily until next Wound Healing Center / Other MD appointment. Notify Wound Healing Center of regression in wound condition at [phone]. o Please direct any NON-WOUND related issues/requests for orders to patient's Primary Care Physician Wound #13 Left,Anterior Lower Leg o Continue Home Health Visits o Home Health Nurse may visit PRN to address patientos wound care needs. o FACE TO FACE ENCOUNTER: MEDICARE and MEDICAID PATIENTS: I certify that this patient is under my care and that I had a face-to-face encounter that meets the physician face-to-face encounter requirements with this patient on this date. The encounter with the patient was in whole or in part for the following MEDICAL CONDITION: (primary reason for Home Healthcare) MEDICAL NECESSITY: I certify, that based on my findings, NURSING services are a medically necessary home health service. HOME BOUND STATUS: I certify that my clinical findings support that this patient is homebound (i.e., Due to illness or injury, pt requires aid of supportive devices such as crutches, cane, wheelchairs, walkers, the use of special transportation or the assistance of another person to leave their place of residence. There is a normal inability to leave the home and doing so requires  considerable and taxing effort. Other absences are for medical reasons / religious services and are infrequent or of short duration when for other reasons). o If current  dressing causes regression in wound condition, may D/Potts ordered dressing product/s and apply Normal Saline Moist Dressing daily until next Wound Healing Center / Other MD appointment. Notify Wound Healing Center of regression in wound condition at [phone]. o Please direct any NON-WOUND related issues/requests for orders to patient's Primary Care Physician Wound #5 Left,Medial Malleolus o Continue Home Health Visits o Home Health Nurse may visit PRN to address patientos wound care needs. o FACE TO FACE ENCOUNTER: MEDICARE and MEDICAID PATIENTS: I certify that this patient is under my care and that I had a face-to-face encounter that meets the physician face-to-face encounter requirements with this patient on this date. The encounter with the patient was in whole or in part for the following MEDICAL CONDITION: (primary reason for Home Healthcare) MEDICAL NECESSITY: I certify, that based on my findings, NURSING services are a medically necessary home health service. HOME BOUND STATUS: I certify that my clinical findings support that this patient is homebound (i.e., Due to illness or injury, pt requires aid of supportive devices such as crutches, cane, wheelchairs, walkers, the use of special transportation or the assistance of another person to leave their place of residence. There is a normal inability to leave the home and doing so requires considerable and taxing effort. Other absences are for medical reasons / religious services and are infrequent or of short duration when for other reasons). o If current dressing causes regression in wound condition, may D/Potts ordered dressing product/s and apply Normal Saline Moist Dressing daily until next Wound Healing Center / Other MD appointment. Notify Wound Healing Center  of regression in wound condition at [phone]. o Please direct any NON-WOUND related issues/requests for orders to patient's Primary Care Physician Medications-please add to medication list. Wound #10 Right,Medial Malleolus o P.O. Antibiotics Wound #13 Left,Anterior Lower Leg [name], [name]. ([id number]) o P.O. Antibiotics Wound #5 Left,Medial Malleolus o P.O. Antibiotics Patient Medications Allergies: No Known Drug Allergies Notifications Medication Indication Start End doxycycline monohydrate cellulitis right leg 12/16/2017 DOSE oral 100 mg capsule - 1 capsule oral bid for 5 days Electronic Signature(s) Signed: 12/16/2017 1:24:12 PM By: Baltazar Najjar MD Entered By: Baltazar Najjar on 12/16/2017 13:24:10 [name] ([id number]) -------------------------------------------------------------------------------- Problem List Details Patient Name: [name]. Date of Service: 12/16/2017 12:45 PM Medical Record Number: [id number] Patient Account Number: [id number] Date of Birth/Sex: 07/16/1929 (84 y.o. Male) Treating RN: Huel Coventry Primary Care Provider: Guerry Bruin Other Clinician: Referring Provider: Guerry Bruin Treating Provider/Extender: Altamese [place] in Treatment: 5 Active Problems ICD-10 Impacting Encounter Code Description Active Date Wound Healing Diagnosis L97.211 Non-pressure chronic ulcer of right calf limited to breakdown 11/11/2017 Yes of skin L97.221 Non-pressure chronic ulcer of left calf limited to breakdown of 11/11/2017 Yes skin I87.333 Chronic venous hypertension (idiopathic) with ulcer and 11/11/2017 Yes inflammation of bilateral lower extremity Inactive Problems Resolved Problems Electronic Signature(s) Signed: 12/16/2017 5:02:04 PM By: Baltazar Najjar MD Entered By: Baltazar Najjar on 12/16/2017 13:24:57 [name]  ([id number]) -------------------------------------------------------------------------------- Progress Note Details Patient Name: [name]. Date of Service: 12/16/2017 12:45 PM Medical Record Number: [id number] Patient Account Number: [id number] Date of Birth/Sex: June 14, 1929 (84 y.o. Male) Treating RN: Huel Coventry Primary Care Provider: Guerry Bruin Other Clinician: Referring Provider: Guerry Bruin Treating Provider/Extender: Maxwell Caul [place] in Treatment: 5 Subjective History of Present Illness (HPI) Pleasant 84 year old with history of chronic venous insufficiency, remote history of DVT, atrial fibrillation (on Coumadin). No history of diabetes. He says that he developed worsening left lower  extremity swelling, blisters, and subsequent ulcerations in late October 2016. He was started on Augmentin for left lower extremity cellulitis, which has improved. Arterial ultrasound 07/27/2015 showed no significant peripheral arterial disease. Awaiting venous ultrasound. Performing dressing changes with silver alginate and using a Tubigrip for edema control. He denies any significant pain. No claudication or rest pain. Ambulating per his baseline with a walker. No fever or chills. Moderate drainage. 10/10/15; the patient arrives back today having not been seen in almost 2 months. He has no open wounds on his legs. He has a history of venous insufficiency, arterial insufficiency. He has chronic Coumadin use secondary to atrial fibrillation and a remote history of DVT. Fortunately as he has not been here in 2 months we were not able to order him stockings. He is not able to get on standard compression hose. Juxtalite stockings would cost him probably about $100 appear, the couple was not able to afford this. 11/11/17 READMISSION This is an 84 year old man who is not a diabetic. He does have a history of sick sinus syndrome atrial fibrillation and has a cardiac pacemaker and is on  Coumadin.he also has a history of DVT. His wife states that episodically over the last 2 years he has had wounds on his lower extremities but they have healed with home measures. Roughly 2-3 weeks ago the legs became a lot more swollen and he has had draining areas on the right anterior medial and lateral and the left medial leg. The patient's wife states that they were told to put support stockings on him but they are concerned because they worsen the blisters. this bit difficult to get the history but they think the edema has been increasing over the last several months. 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 He does not complain of chest pain orthopnea. He does not have a history of kidney issues that he is aware. Primary doctor at [facility]. He is not on diuretics. He was here 2 years ago. He was noted to have edema in his legs at that time. He had an ultrasound of his legs which I'll need to review. There was discussion about compression stockings but he is complaining clearly not using them. ABI in the right in this clinic was 1.37, 1.07 on the left 11/18/17; this is an elderly frail man who has bilateral small lower extremity wounds in the setting of very significant multifactorial edema. He probably has chronic venous insufficiency with lymphedema but it is clearly fluid overloaded last week. He apparently has started on diuretics through his primary physician at [facility] but the patient's wife is not sure which one but states that he goes to the bathroom every 15 minutes. He is tolerating compression wraps. His wife stated that they charged him full price stating that he didn't have open wounds on his legs that would cover wraparound stockings. His edema is much better controlled 11/25/17-he is here in follow-up evaluation for bilateral lower extremity ulcers. He is tolerating compression therapy. There was one episode since  last appointment with copious amounts of drainage from the right medial malleolus. We will add additional absorptive dressing, otherwise no change in treatment plan and he will follow-up next week 12/02/17; since I last saw this man is edema control is a lot better and I think this is a combination of diuretic therapy and are 3 layer compression. He has severe bilateral venous insufficiency with hemosiderin deposition and likely  chronic stasis dermatitis/inflammation. In spite of the edema control he has several weeping areas on both legs including the left lateral leg, area on the right posterior leg. He also has an excoriated area just below his right tibial tuberosity. Fortuitously the patient has a follow-up with cardiology tomorrow 12/09/17; his edema control is excellent now on 40 mg of Lasix on his cardiologist last week and according to his wife was thoroughly evaluated including checking his pacemaker and no medication changes are made. He has good edema control in his lower legs. He has significant chronic venous insufficiency with hemosiderin physician as well as stasis dermatitis. major [name], [name] ([id number]) areas on the medial malleoli 12/16/17; he has excellent edema control however he is still having a lot of drainage and a lot of discomfort on the medial part of his right ankle/calf. The only other wound of any substances on the left medial malleolus. He is not been systemically unwell. Objective Constitutional Sitting or standing Blood Pressure is within target range for patient.. Pulse regular and within target range for patient.Marland Kitchen Respirations regular, non-labored and within target range.. Temperature is normal and within the target range for the patient.Marland Kitchen appears in no distress. Vitals Time Taken: 12:54 PM, Height: 68 in, Weight: 150 lbs, BMI: 22.8, Temperature: 98.1 F, Pulse: 69 bpm, Respiratory Rate: 16 breaths/min, Blood Pressure: 104/64 mmHg. Eyes Conjunctivae  clear. No discharge. Respiratory Respiratory effort is easy and symmetric bilaterally. Rate is normal at rest and on room air.. Bilateral breath sounds are clear and equal in all lobes with no wheezes, rales or rhonchi.. Cardiovascular Heart rhythm and rate regular, without murmur or gallop.. Musculoskeletal no involvement of the ankle is seen no fluid. There is marked tenderness around the wound areas.Marland Kitchen Psychiatric No evidence of depression, anxiety, or agitation. Calm, cooperative, and communicative. Appropriate interactions and affect.. General Notes: wound exam Open areas predominantly now include the right medial ankle/malleolus. Weeping edema coming fluid coming out of this even though there is not a lot of edema in the area. Is bit difficult to be certain because of the coexistent hemosiderin deposition but I think there is erythema and there is certainly marked tenderness compatible with cellulitis The only other area is actually the left medial malleolus. This looks fairly benign him not sure the dimension changes Integumentary (Hair, Skin) severe bilateral chronic venous insufficiency. There is some erythema on the medial right ankle. Wound #10 status is Open. Original cause of wound was Gradually Appeared. The wound is located on the Right,Medial Malleolus. The wound measures 0.8cm length x 1.4cm width x 0.1cm depth; 0.88cm^2 area and 0.088cm^3 volume. There is no tunneling or undermining noted. There is a large amount of serosanguineous drainage noted. The wound margin is distinct with the outline attached to the wound base. There is large (67-100%) red granulation within the wound bed. There is a small (1-33%) amount of necrotic tissue within the wound bed including Adherent Slough. The periwound skin appearance exhibited: Maceration. Periwound temperature was noted as No Abnormality. The periwound has tenderness on palpation. Wound #11 status is Healed - Epithelialized. Original  cause of wound was Gradually Appeared. The wound is located on the Left,Dorsal Foot. The wound measures 0cm length x 0cm width x 0cm depth; 0cm^2 area and 0cm^3 volume. [name], Donald CMarland Kitchen ([id number]) Wound #13 status is Open. Original cause of wound was Gradually Appeared. The wound is located on the Left,Anterior Lower Leg. The wound measures 0.1cm length x 0.1cm width x 0.1cm depth; 0.008cm^2 area  and 0.001cm^3 volume. There is no tunneling or undermining noted. There is a none present amount of drainage noted. The wound margin is flat and intact. There is no granulation within the wound bed. There is a large (67-100%) amount of necrotic tissue within the wound bed including Eschar. The periwound skin appearance exhibited: Maceration, Erythema. The surrounding wound skin color is noted with erythema which is circumferential. Periwound temperature was noted as No Abnormality. The periwound has tenderness on palpation. Wound #5 status is Open. Original cause of wound was Blister. The wound is located on the Left,Medial Malleolus. The wound measures 0.8cm length x 1.2cm width x 0.1cm depth; 0.754cm^2 area and 0.075cm^3 volume. There is Fat Layer (Subcutaneous Tissue) Exposed exposed. There is no tunneling or undermining noted. There is a large amount of serosanguineous drainage noted. The wound margin is flat and intact. There is medium (34-66%) red granulation within the wound bed. There is a medium (34-66%) amount of necrotic tissue within the wound bed including Adherent Slough. The periwound skin appearance exhibited: Erythema. The periwound skin appearance did not exhibit: Callus, Crepitus, Excoriation, Induration, Rash, Scarring, Dry/Scaly, Maceration, Atrophie Blanche, Cyanosis, Ecchymosis, Hemosiderin Staining, Mottled, Pallor, Rubor. The surrounding wound skin color is noted with erythema which is circumferential. Periwound temperature was noted as No Abnormality. The periwound has  tenderness on palpation. Wound #7 status is Healed - Epithelialized. Original cause of wound was Gradually Appeared. The wound is located on the Right,Anterior Lower Leg. The wound measures 0cm length x 0cm width x 0cm depth; 0cm^2 area and 0cm^3 volume. 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 There was a Chemical/Enzymatic/Mechanical debridement performed by Maxwell Caul, MD. With the following instrument(s): saline and gauze. after achieving pain control using Other (lidocaine 4%). Agent used was other. A time out was conducted at 13:00, prior to the start of the procedure. A Minimum amount of bleeding was controlled with Pressure. The procedure was tolerated well with a pain level of 2 throughout and a pain level of 1 following the procedure. Post Debridement Measurements: 0.8cm length x 1.4cm width x 0.1cm depth; 0.088cm^3 volume. Character of Wound/Ulcer Post Debridement is stable. Severity of Tissue Post Debridement is: Limited to breakdown of skin. Post procedure Diagnosis Wound #10: Same as Pre-Procedure Pre-procedure diagnosis of Wound #10 is a Venous Leg Ulcer located on the Right,Medial Malleolus . There was a Three Layer Compression Therapy Procedure with a pre-treatment ABI of 1 by Huel Coventry, RN. Post procedure Diagnosis Wound #10: Same as Pre-Procedure Wound #13 Pre-procedure diagnosis of Wound #13 is a Venous Leg Ulcer located on the Left,Anterior Lower Leg . There was a Three [place], Davanta Potts. ([id number]) Layer Compression Therapy  Procedure with a pre-treatment ABI of 1.4 by Huel Coventry, RN. Post procedure Diagnosis Wound #13: Same as Pre-Procedure Plan Wound Cleansing: Wound #10 Right,Medial Malleolus: Cleanse wound with mild soap and water - HHRN please wash legs Wound #13 Left,Anterior Lower Leg: Cleanse wound with mild soap and water - HHRN please wash legs Wound #5 Left,Medial Malleolus: Cleanse wound with mild soap and water - HHRN please wash legs Anesthetic (add to Medication  List): Wound #10 Right,Medial Malleolus: Topical Lidocaine 4% cream applied to wound bed prior to debridement (In Clinic Only). Wound #13 Left,Anterior Lower Leg: Topical Lidocaine 4% cream applied to wound bed prior to debridement (In Clinic Only). Wound #5 Left,Medial Malleolus: Topical Lidocaine 4% cream applied to wound bed prior to debridement (In Clinic Only). Skin Barriers/Peri-Wound Care: Wound #10 Right,Medial Malleolus: Moisturizing lotion - HHRN to moisturize Other: - OTC Lotrimin Cream on top of right foot. Wound #13 Left,Anterior Lower Leg: Moisturizing lotion - HHRN to moisturize Other: - OTC Lotrimin Cream on top of right foot. Wound #5 Left,Medial Malleolus: Moisturizing lotion - HHRN to moisturize Other: - OTC Lotrimin Cream on top of right foot. Primary Wound Dressing: Wound #10 Right,Medial Malleolus: Silver Alginate Wound #13 Left,Anterior Lower Leg: Silver Alginate Wound #5 Left,Medial Malleolus: Silver Alginate Secondary Dressing: Wound #10 Right,Medial Malleolus: ABD pad - cover all wounds Foam - Reddened area at top of wraps bilaterally Drawtex - if needed for drainage Wound #13 Left,Anterior Lower Leg: ABD pad - cover all wounds Foam - Reddened area at top of wraps bilaterally Drawtex - if needed for drainage Wound #5 Left,Medial Malleolus: ABD pad - cover all wounds Foam - Reddened area at top of wraps bilaterally Drawtex - if needed for drainage Dressing Change Frequency: Wound #10  Right,Medial Malleolus: Change Dressing Monday, Wednesday, Friday Wound #13 Left,Anterior Lower Leg: Change Dressing Monday, Wednesday, Friday [name], [name]. ([id number]) Wound #5 Left,Medial Malleolus: Change Dressing Monday, Wednesday, Friday Follow-up Appointments: Return Appointment in 1 week. Edema Control: Wound #10 Right,Medial Malleolus: 3 Layer Compression System - Bilateral - Please do not place coban directly on legs or roll down tops of wraps. Elevate legs to the level of the heart and pump ankles as often as possible Wound #13 Left,Anterior Lower Leg: 3 Layer Compression System - Bilateral - Please do not place coban directly on legs or roll down tops of wraps. Elevate legs to the level of the heart and pump ankles as often as possible Wound #5 Left,Medial Malleolus: 3 Layer Compression System - Bilateral - Please do not place coban directly on legs or roll down tops of wraps. Elevate legs to the level of the heart and pump ankles as often as possible Additional Orders / Instructions: Wound #10 Right,Medial Malleolus: Vitamin A; Vitamin Potts, Zinc Activity as tolerated Wound #13 Left,Anterior Lower Leg: Vitamin A; Vitamin Potts, Zinc Activity as tolerated Wound #5 Left,Medial Malleolus: Vitamin A; Vitamin Potts, Zinc Activity as tolerated Home Health: Wound #10 Right,Medial Malleolus: Continue Home Health Visits Home Health Nurse may visit PRN to address patient s wound care needs. FACE TO FACE ENCOUNTER: MEDICARE and MEDICAID PATIENTS: I certify that this patient is under my care and that I had a face-to-face encounter that meets the physician face-to-face encounter requirements with this patient on this date. The encounter with the patient was in whole or in part for the following MEDICAL CONDITION: (primary reason for Home Healthcare) MEDICAL NECESSITY: I certify, that based on my findings, NURSING services are a medically necessary home health service. HOME BOUND  STATUS: I certify that my clinical findings support that this patient is homebound (i.e., Due to illness or injury, pt requires aid of supportive devices such as crutches, cane, wheelchairs, walkers, the use of special transportation or the assistance of another person to leave their place of residence. There is a normal inability to leave the home and doing so requires considerable and taxing effort. Other absences are for medical reasons /  religious services and are infrequent or of short duration when for other reasons). If current dressing causes regression in wound condition, may D/Potts ordered dressing product/s and apply Normal Saline Moist Dressing daily until next Wound Healing Center / Other MD appointment. Notify Wound Healing Center of regression in wound condition at [phone]. Please direct any NON-WOUND related issues/requests for orders to patient's Primary Care Physician Wound #13 Left,Anterior Lower Leg: Continue Home Health Visits Home Health Nurse may visit PRN to address patient s wound care needs. FACE TO FACE ENCOUNTER: MEDICARE and MEDICAID PATIENTS: I certify that this patient is under my care and that I had a face-to-face encounter that meets the physician face-to-face encounter requirements with this patient on this date. The encounter with the patient was in whole or in part for the following MEDICAL CONDITION: (primary reason for Home Healthcare) MEDICAL NECESSITY: I certify, that based on my findings, NURSING services are a medically necessary home health service. HOME BOUND STATUS: I certify that my clinical findings support that this patient is homebound (i.e., Due to illness or injury, pt requires aid of supportive devices such as crutches, cane, wheelchairs, walkers, the use of special transportation or the assistance of another person to leave their place of residence. There is a normal inability to leave the home and doing so requires considerable and taxing  effort. Other absences are for medical reasons / religious services and are infrequent or of short duration when for other reasons). If current dressing causes regression in wound condition, may D/Potts ordered dressing product/s and apply Normal Saline Moist Dressing daily until next Wound Healing Center / Other MD appointment. Notify Wound Healing Center of regression in wound condition at [phone]. Please direct any NON-WOUND related issues/requests for orders to patient's Primary Care Physician Wound #5 Left,Medial Malleolus: Continue Home Health Visits Home Health Nurse may visit PRN to address patient s wound care needs. [name], [name] ([id number]) FACE TO FACE ENCOUNTER: MEDICARE and MEDICAID PATIENTS: I certify that this patient is under my care and that I had a face-to-face encounter that meets the physician face-to-face encounter requirements with this patient on this date. The encounter with the patient was in whole or in part for the following MEDICAL CONDITION: (primary reason for Home Healthcare) MEDICAL NECESSITY: I certify, that based on my findings, NURSING services are a medically necessary home health service. HOME BOUND STATUS: I certify that my clinical findings support that this patient is homebound (i.e., Due to illness or injury, pt requires aid of supportive devices such as crutches, cane, wheelchairs, walkers, the use of special transportation or the assistance of another person to leave their place of residence. There is a normal inability to leave the home and doing so requires considerable and taxing effort. Other absences are for medical reasons / religious services and are infrequent or of short duration when for other reasons). If current dressing causes regression in wound condition, may D/Potts ordered dressing product/s and apply Normal Saline Moist Dressing daily until next Wound Healing Center / Other MD appointment. Notify Wound Healing Center of regression  in wound condition at [phone]. Please direct any NON-WOUND related issues/requests for orders to patient's Primary Care Physician Medications-please add to medication list.: Wound #10 Right,Medial Malleolus: P.O. Antibiotics Wound #13 Left,Anterior Lower Leg: P.O. Antibiotics Wound #5 Left,Medial Malleolus: P.O. Antibiotics The following medication(s) was prescribed: doxycycline monohydrate oral 100 mg capsule 1 capsule oral bid for 5 days for cellulitis right leg starting 12/16/2017 #1 1E doxycycline 100 twice  a day for 5 days #2 silver alginate to all open wounds #3the wound is weeping edema fluid for no apparent reason. This is what led [place] to question cellulitis. Electronic Signature(s) Signed: 12/17/2017 2:17:54 PM By: Elliot Gurney, [name], RN, CWS, [name], [name] Signed: 12/17/2017 4:16:31 PM By: Baltazar Najjar MD Previous Signature: 12/16/2017 5:02:04 PM Version By: Baltazar Najjar MD Entered By: Elliot Gurney, [name], RN, CWS, Kim on 12/17/2017 14:17:54 [name], [name] ([id number]) -------------------------------------------------------------------------------- SuperBill Details Patient Name: [name]. Date of Service: 12/16/2017 Medical Record Number: [id number] Patient Account Number: [id number] Date of Birth/Sex: 11/01/1928 (84 y.o. Male) Treating RN: Huel Coventry Primary Care Provider: Guerry Bruin Other Clinician: Referring Provider: Guerry Bruin Treating Provider/Extender: Maxwell Caul Weeks in Treatment: 5 Diagnosis Coding ICD-10 Codes Code Description [phone] Non-pressure chronic ulcer of right calf limited to breakdown of skin L97.221 Non-pressure chronic ulcer of left calf limited to breakdown of skin I87.333 Chronic venous hypertension (idiopathic) with ulcer and inflammation of bilateral lower extremity L03.115 Cellulitis of right lower limb Facility Procedures CPT4: Description Modifier Quantity Code [id number] BILATERAL: Application of multi-layer  venous compression system; leg (below 1 knee), including ankle and foot. Physician Procedures CPT4 Code: [id number] Description: 99214 - WC PHYS LEVEL 4 - EST PT ICD-10 Diagnosis Description L97.211 Non-pressure chronic ulcer of right calf limited to breakdo L97.221 Non-pressure chronic ulcer of left calf limited to breakdow Modifier: wn of skin n of skin Quantity: 1 Electronic Signature(s) Signed: 12/16/2017 5:02:04 PM By: Baltazar Najjar MD Entered By: Baltazar Najjar on 12/16/2017 13:38:20

## 2017-12-18 DIAGNOSIS — I4891 Unspecified atrial fibrillation: Secondary | ICD-10-CM | POA: Diagnosis not present

## 2017-12-18 DIAGNOSIS — L97221 Non-pressure chronic ulcer of left calf limited to breakdown of skin: Secondary | ICD-10-CM | POA: Diagnosis not present

## 2017-12-18 DIAGNOSIS — I87333 Chronic venous hypertension (idiopathic) with ulcer and inflammation of bilateral lower extremity: Secondary | ICD-10-CM | POA: Diagnosis not present

## 2017-12-18 DIAGNOSIS — L97321 Non-pressure chronic ulcer of left ankle limited to breakdown of skin: Secondary | ICD-10-CM | POA: Diagnosis not present

## 2017-12-18 DIAGNOSIS — L97211 Non-pressure chronic ulcer of right calf limited to breakdown of skin: Secondary | ICD-10-CM | POA: Diagnosis not present

## 2017-12-18 DIAGNOSIS — I10 Essential (primary) hypertension: Secondary | ICD-10-CM | POA: Diagnosis not present

## 2017-12-20 NOTE — Progress Notes (Signed)
FRANCISO, DIERKS ([id number]) Visit Report for 12/16/2017 Arrival Information Details Patient Name: [name], [name]. Date of Service: 12/16/2017 12:45 PM Medical Record Number: [id number] Patient Account Number: [id number] Date of Birth/Sex: August 06, 1929 (84 y.o. Male) Treating RN: Donald Crigler Primary Care Terald Jump: Guerry Bruin Other Clinician: Referring Serena Petterson: Guerry Bruin Treating Camil Wilhelmsen/Extender: Altamese [place] in Treatment: 5 Visit Information History Since Last Visit All ordered tests and consults were completed: No Patient Arrived: Wheel Chair Added or deleted any medications: No Arrival Time: 12:53 Any new allergies or adverse reactions: No Accompanied By: wife Had a fall or experienced change in No Transfer Assistance: None activities of daily living that may affect Patient Identification Verified: Yes risk of falls: Secondary Verification Process Yes Signs or symptoms of abuse/neglect since last visito No Completed: Hospitalized since last visit: No Patient Requires Transmission-Based No Implantable device outside of the clinic excluding No Precautions: cellular tissue based products placed in the center Patient Has Alerts: Yes since last visit: Patient Alerts: Patient on Blood Pain Present Now: No Thinner Warfarin Pacemaker Electronic Signature(s) Signed: 12/18/2017 4:04:12 PM By: Donald Crigler Entered By: Donald Crigler on 12/16/2017 12:54:06 [name] ([id number]) -------------------------------------------------------------------------------- Compression Therapy Details Patient Name: Donald Musca C. Date of Service: 12/16/2017 12:45 PM Medical Record Number: [id number] Patient Account Number: [id number] Date of Birth/Sex: 03-05-1929 (84 y.o. Male) Treating RN: Huel Coventry Primary Care Mahdiya Mossberg: Guerry Bruin Other Clinician: Referring Leanza Shepperson: Guerry Bruin Treating Bayani Renteria/Extender: Altamese [place] in Treatment: 5 Compression Therapy Performed for Wound Assessment: Wound #13 Left,Anterior Lower Leg Performed By: Clinician Huel Coventry, RN Compression Type: Three Layer Pre Treatment ABI: 1.4 Post Procedure Diagnosis Same as Pre-procedure Electronic Signature(s) Signed: 12/16/2017 1:43:28 PM By: Elliot Gurney, [name], RN, CWS, [name], [name] Entered By: Elliot Gurney, [name], RN, CWS, Kim on 12/16/2017 13:19:24 [name] ([id number]) -------------------------------------------------------------------------------- Compression Therapy Details Patient Name: Donald Musca C. Date of Service: 12/16/2017 12:45 PM Medical Record Number: [id number] Patient Account Number: [id number] Date of Birth/Sex: 10-31-1928 (84 y.o. Male) Treating RN: Huel Coventry Primary Care Maysel Mccolm: Guerry Bruin Other Clinician: Referring Marni Franzoni: Guerry Bruin Treating Alessio Bogan/Extender: Maxwell Caul Weeks in Treatment: 5 Compression Therapy Performed for Wound Assessment: Wound #10 Right,Medial Malleolus Performed By: Clinician Huel Coventry, RN Compression Type: Three Layer Pre Treatment ABI: 1 Post Procedure Diagnosis Same as Pre-procedure Electronic Signature(s) Signed: 12/16/2017 1:43:28 PM By: Elliot Gurney, [name], RN, CWS, [name], [name] Entered By: Elliot Gurney, [name], RN, CWS, Kim on 12/16/2017 13:20:01 [name] ([id number]) -------------------------------------------------------------------------------- Encounter Discharge Information Details Patient Name: Donald Musca C. Date of Service: 12/16/2017 12:45 PM Medical Record Number: [id number] Patient Account Number: [id number] Date of Birth/Sex: 11/27/1928 (84 y.o. Male) Treating RN: Huel Coventry Primary Care Devesh Monforte: Guerry Bruin Other Clinician: Referring Vercie Pokorny: Guerry Bruin Treating Talecia Sherlin/Extender: Altamese [place] in Treatment: 5 Encounter Discharge Information Items Discharge Pain Level: 0 Discharge Condition: Stable Ambulatory  Status: Wheelchair Discharge Destination: Home Transportation: Private Auto Accompanied By: wife Schedule Follow-up Appointment: Yes Medication Reconciliation completed and No provided to Patient/Care Dewight Catino: Provided on Clinical Summary of Care: 12/16/2017 Form Type Recipient Paper Patient RW Electronic Signature(s) Signed: 12/18/2017 4:04:12 PM By: Donald Crigler Entered By: Donald Crigler on 12/16/2017 13:50:07 [name] ([id number]) -------------------------------------------------------------------------------- Lower Extremity Assessment Details Patient Name: Donald Musca C. Date of Service: 12/16/2017 12:45 PM Medical Record Number: [id number] Patient Account Number: [id number] Date of Birth/Sex: [birth year] (84 y.o. Male) Treating RN: Donald Crigler Primary Care Layken Beg: Guerry Bruin Other Clinician: Referring Kirtis Challis: Guerry Bruin Treating  Larisa Lanius/Extender: Maxwell Caul Weeks in Treatment: 5 Edema Assessment Assessed: [Left: No] [Right: No] [Left: Edema] [Right: :] Calf Left: Right: Point of Measurement: 33 cm From Medial Instep 30.2 cm 29.7 cm Ankle Left: Right: Point of Measurement: 12 cm From Medial Instep 22.6 cm 22.2 cm Vascular Assessment Pulses: Dorsalis Pedis Palpable: [Left:Yes] [Right:Yes] Posterior Tibial Extremity colors, hair growth, and conditions: Extremity Color: [Left:Hyperpigmented] [Right:Hyperpigmented] Hair Growth on Extremity: [Left:No] [Right:No] Temperature of Extremity: [Left:Warm] [Right:Warm] Capillary Refill: [Left:< 3 seconds] [Right:< 3 seconds] Toe Nail Assessment Left: Right: Thick: Yes Yes Discolored: Yes Yes Deformed: Yes Yes Improper Length and Hygiene: Yes Yes Electronic Signature(s) Signed: 12/18/2017 4:04:12 PM By: Donald Crigler Entered By: Donald Crigler on 12/16/2017 13:04:02 [name]  ([id number]) -------------------------------------------------------------------------------- Multi Wound Chart Details Patient Name: Donald Musca C. Date of Service: 12/16/2017 12:45 PM Medical Record Number: [id number] Patient Account Number: [id number] Date of Birth/Sex: 1929-03-12 (84 y.o. Male) Treating RN: Huel Coventry Primary Care Dera Vanaken: Guerry Bruin Other Clinician: Referring Bruce Mayers: Guerry Bruin Treating Kavion Mancinas/Extender: Maxwell Caul Weeks in Treatment: 5 Vital Signs Height(in): 68 Pulse(bpm): 69 Weight(lbs): 150 Blood Pressure(mmHg): 104/64 Body Mass Index(BMI): 23 Temperature(F): 98.1 Respiratory Rate 16 (breaths/min): Photos: [10:No Photos] [11:No Photos] [13:No Photos] Wound Location: [10:Right Malleolus - Medial] [11:Left, Dorsal Foot] [13:Left Lower Leg - Anterior] Wounding Event: [10:Gradually Appeared] [11:Gradually Appeared] [13:Gradually Appeared] Primary Etiology: [10:Venous Leg Ulcer] [11:Fungal] [13:Venous Leg Ulcer] Comorbid History: [10:Cataracts, Arrhythmia, Deep Vein Thrombosis, Osteoarthritis] [11:N/A] [13:Cataracts, Arrhythmia, Deep Vein Thrombosis, Osteoarthritis] Date Acquired: [10:12/02/2017] [11:12/02/2017] [13:12/09/2017] Weeks of Treatment: [10:2] [11:2] [13:1] Wound Status: [10:Open] [11:Healed - Epithelialized] [13:Open] Measurements L x W x D [10:0.8x1.4x0.1] [11:0x0x0] [13:0.1x0.1x0.1] (cm) Area (cm) : [10:0.88] [11:0] [13:0.008] Volume (cm) : [10:0.088] [11:0] [13:0.001] % Reduction in Area: [10:51.30%] [11:100.00%] [13:93.20%] % Reduction in Volume: [10:51.40%] [11:100.00%] [13:91.70%] Classification: [10:Full Thickness Without Exposed Support Structures] [11:Full Thickness Without Exposed Support Structures] [13:Full Thickness Without Exposed Support Structures] Exudate Amount: [10:Large] [11:N/A] [13:None Present] Exudate Type: [10:Serosanguineous] [11:N/A] [13:N/A] Exudate Color: [10:red, brown] [11:N/A]  [13:N/A] Wound Margin: [10:Distinct, outline attached] [11:N/A] [13:Flat and Intact] Granulation Amount: [10:Large (67-100%)] [11:N/A] [13:None Present (0%)] Granulation Quality: [10:Red] [11:N/A] [13:N/A] Necrotic Amount: [10:Small (1-33%)] [11:N/A] [13:Large (67-100%)] Necrotic Tissue: [10:Adherent Slough] [11:N/A] [13:Eschar] Exposed Structures: [10:Fascia: No Fat Layer (Subcutaneous Tissue) Exposed: No Tendon: No Muscle: No Joint: No Bone: No] [11:N/A] [13:Fascia: No Fat Layer (Subcutaneous Tissue) Exposed: No Tendon: No Muscle: No Joint: No Bone: No] Epithelialization: [10:None] [11:N/A] [13:None] Periwound Skin Texture: [10:No Abnormalities Noted] [11:No Abnormalities Noted] [13:No Abnormalities Noted] Periwound Skin Moisture: [10:Maceration: Yes] [11:No Abnormalities Noted] [13:Maceration: Yes] Periwound Skin Color: [10:No Abnormalities Noted] [11:No Abnormalities Noted] [13:Erythema: Yes] Erythema Location: N/A N/A Circumferential Temperature: No Abnormality N/A No Abnormality Tenderness on Palpation: Yes No Yes Wound Preparation: Ulcer Cleansing: N/A Ulcer Cleansing: Rinsed/Irrigated with Saline Rinsed/Irrigated with Saline, Other: soap nd water Topical Anesthetic Applied: Other: lidocaine 4% Topical Anesthetic Applied: Other: lidocaine 4% Procedures Performed: Compression Therapy N/A Compression Therapy Wound Number: 5 7 N/A Photos: No Photos No Photos N/A Wound Location: Left Malleolus - Medial Right, Anterior Lower Leg N/A Wounding Event: Blister Gradually Appeared N/A Primary Etiology: Venous Leg Ulcer Venous Leg Ulcer N/A Comorbid History: Cataracts, Arrhythmia, Deep N/A N/A Vein Thrombosis, Osteoarthritis Date Acquired: 10/28/2017 10/28/2017 N/A Weeks of Treatment: 5 5 N/A Wound Status: Open Healed - Epithelialized N/A Measurements L x W x D 0.8x1.2x0.1 0x0x0 N/A (cm) Area (cm) : 0.754 0 N/A Volume (cm) : 0.075 0 N/A % Reduction in Area: -  219.50% 100.00%  N/A % Reduction in Volume: -212.50% 100.00% N/A Classification: Full Thickness Without Full Thickness Without N/A Exposed Support Structures Exposed Support Structures Exudate Amount: Large N/A N/A Exudate Type: Serosanguineous N/A N/A Exudate Color: red, brown N/A N/A Wound Margin: Flat and Intact N/A N/A Granulation Amount: Medium (34-66%) N/A N/A Granulation Quality: Red N/A N/A Necrotic Amount: Medium (34-66%) N/A N/A Necrotic Tissue: Adherent Slough N/A N/A Exposed Structures: Fat Layer (Subcutaneous N/A N/A Tissue) Exposed: Yes Fascia: No Tendon: No Muscle: No Joint: No Bone: No Epithelialization: None N/A N/A Periwound Skin Texture: Excoriation: No No Abnormalities Noted N/A Induration: No Callus: No Crepitus: No Rash: No Scarring: No Periwound Skin Moisture: Maceration: No No Abnormalities Noted N/A Dry/Scaly: No Periwound Skin Color: Erythema: Yes No Abnormalities Noted N/A Atrophie Blanche: No Cyanosis: No Hallstrom, Orva C. ([id number]) Ecchymosis: No Hemosiderin Staining: No Mottled: No Pallor: No Rubor: No Erythema Location: Circumferential N/A N/A Temperature: No Abnormality N/A N/A Tenderness on Palpation: Yes No N/A Wound Preparation: Ulcer Cleansing: N/A N/A Rinsed/Irrigated with Saline, Other: soap and water Topical Anesthetic Applied: Other: lidocaine 4% Procedures Performed: N/A N/A N/A Treatment Notes Electronic Signature(s) Signed: 12/16/2017 5:02:04 PM By: Baltazar Najjarobson, Michael MD Entered By: Baltazar Najjarobson, Michael on 12/16/2017 13:25:27 Donald MouseWILLIFORD, [name] ([id number]) -------------------------------------------------------------------------------- Multi-Disciplinary Care Plan Details Patient Name: Donald MouseWILLIFORD, [name] Date of Service: 12/16/2017 12:45 PM Medical Record Number: [id number] Patient Account Number: [id number] Date of Birth/Sex: 05/31/1929 2(84 y.o. Male) Treating RN: Huel CoventryWoody, Kim Primary Care Hydie Langan: Guerry BruinISOVEC, RICHARD Other  Clinician: Referring Michalle Rademaker: Guerry BruinISOVEC, RICHARD Treating Alexxa Sabet/Extender: Altamese CarolinaOBSON, MICHAEL G Weeks in Treatment: 5 Active Inactive ` Abuse / Safety / Falls / Self Care Management Nursing Diagnoses: Impaired physical mobility Potential for falls Goals: Patient will not develop complications from immobility Date Initiated: 11/11/2017 Target Resolution Date: 12/09/2017 Goal Status: Active Patient will remain injury free related to falls Date Initiated: 11/11/2017 Target Resolution Date: 12/09/2017 Goal Status: Active Interventions: Assess fall risk on admission and as needed Notes: ` Nutrition Nursing Diagnoses: Potential for alteratiion in Nutrition/Potential for imbalanced nutrition Goals: Patient/caregiver agrees to and verbalizes understanding of need to use nutritional supplements and/or vitamins as prescribed Date Initiated: 11/11/2017 Target Resolution Date: 12/10/2017 Goal Status: Active Interventions: Assess patient nutrition upon admission and as needed per policy Notes: ` Orientation to the Wound Care Program Nursing Diagnoses: Knowledge deficit related to the wound healing center program Goals: Patient/caregiver will verbalize understanding of the Wound Healing Center [address]Program Donald MouseWILLIFORD, [name] ([id number]) Date Initiated: 11/11/2017 Target Resolution Date: 12/10/2017 Goal Status: Active Interventions: Provide education on orientation to the wound center Notes: ` Venous Leg Ulcer Nursing Diagnoses: Actual venous Insuffiency (use after diagnosis is confirmed) Goals: Non-invasive venous studies are completed as ordered Date Initiated: 11/11/2017 Target Resolution Date: 12/10/2017 Goal Status: Active Interventions: Assess peripheral edema status every visit. Treatment Activities: Non-invasive vascular studies : 11/11/2017 Notes: ` Wound/Skin Impairment Nursing Diagnoses: Impaired tissue integrity Goals: Ulcer/skin breakdown will have a volume reduction of 80% by  week 12 Date Initiated: 11/11/2017 Target Resolution Date: 12/10/2017 Goal Status: Active Interventions: Assess patient/caregiver ability to obtain necessary supplies Provide education on ulcer and skin care Treatment Activities: Topical wound management initiated : 11/11/2017 Notes: Electronic Signature(s) Signed: 12/16/2017 1:43:28 PM By: Elliot GurneyWoody, [name], RN, CWS, [name], [name] Entered By: Elliot GurneyWoody, [name], RN, CWS, Kim on 12/16/2017 13:16:06 Donald MouseWILLIFORD, [name] ([id number]) -------------------------------------------------------------------------------- Pain Assessment Details Patient Name: Donald MuscaWILLIFORD, Maisen C. Date of Service: 12/16/2017 12:45 PM Medical Record Number: [id number] Patient Account Number: [id number] Date of Birth/Sex:  September 17, 1928 (84 y.o. Male) Treating RN: Donald Crigler Primary Care Mose Colaizzi: Guerry Bruin Other Clinician: Referring Laquida Cotrell: Guerry Bruin Treating Evalyne Cortopassi/Extender: Altamese [place] in Treatment: 5 Active Problems Location of Pain Severity and Description of Pain Patient Has Paino No Site Locations Pain Management and Medication Current Pain Management: Electronic Signature(s) Signed: 12/18/2017 4:04:12 PM By: Donald Crigler Entered By: Donald Crigler on 12/16/2017 12:54:14 [name] ([id number]) -------------------------------------------------------------------------------- Patient/Caregiver Education Details Patient Name: [name]. Date of Service: 12/16/2017 12:45 PM Medical Record Number: [id number] Patient Account Number: [id number] Date of Birth/Gender: [birth year] (84 y.o. Male) Treating RN: Donald Crigler Primary Care Physician: Guerry Bruin Other Clinician: Referring Physician: Guerry Bruin Treating Physician/Extender: Altamese [place] in Treatment: 5 Education Assessment Education Provided To: Patient Education Topics Provided Wound/Skin Impairment: Handouts: Caring for Your Ulcer Methods:  Explain/Verbal Responses: State content correctly Electronic Signature(s) Signed: 12/18/2017 4:04:12 PM By: Donald Crigler Entered By: Donald Crigler on 12/16/2017 13:50:23 [name] ([id number]) -------------------------------------------------------------------------------- Wound Assessment Details Patient Name: Donald Musca C. Date of Service: 12/16/2017 12:45 PM Medical Record Number: [id number] Patient Account Number: [id number] Date of Birth/Sex: 1929/07/30 (84 y.o. Male) Treating RN: Donald Crigler Primary Care Brithney Bensen: Guerry Bruin Other Clinician: Referring Ariyah Sedlack: Guerry Bruin Treating Emersyn Kotarski/Extender: Maxwell Caul Weeks in Treatment: 5 Wound Status Wound Number: 10 Primary Venous Leg Ulcer Etiology: Wound Location: Right Malleolus - Medial Wound Status: Open Wounding Event: Gradually Appeared Comorbid Cataracts, Arrhythmia, Deep Vein Date Acquired: 12/02/2017 History: Thrombosis, Osteoarthritis Weeks Of Treatment: 2 Clustered Wound: No Photos Photo Uploaded By: Curtis Sites on 12/16/2017 15:14:32 Wound Measurements Length: (cm) 0.8 Width: (cm) 1.4 Depth: (cm) 0.1 Area: (cm) 0.88 Volume: (cm) 0.088 % Reduction in Area: 51.3% % Reduction in Volume: 51.4% Epithelialization: None Tunneling: No Undermining: No Wound Description Full Thickness Without Exposed Support Classification: Structures Wound Margin: Distinct, outline attached Exudate Large Amount: Exudate Type: Serosanguineous Exudate Color: red, brown Foul Odor After Cleansing: No Slough/Fibrino Yes Wound Bed Granulation Amount: Large (67-100%) Exposed Structure Granulation Quality: Red Fascia Exposed: No Necrotic Amount: Small (1-33%) Fat Layer (Subcutaneous Tissue) Exposed: No Necrotic Quality: Adherent Slough Tendon Exposed: No Muscle Exposed: No Joint Exposed: No Bone Exposed: No [name], [name] ([id number]) Periwound Skin Texture Texture  Color No Abnormalities Noted: No No Abnormalities Noted: No Moisture Temperature / Pain No Abnormalities Noted: No Temperature: No Abnormality Maceration: Yes Tenderness on Palpation: Yes Wound Preparation Ulcer Cleansing: Rinsed/Irrigated with Saline Topical Anesthetic Applied: Other: lidocaine 4%, Treatment Notes Wound #10 (Right, Medial Malleolus) 1. Cleansed with: Clean wound with Normal Saline 2. Anesthetic Topical Lidocaine 4% cream to wound bed prior to debridement 4. Dressing Applied: Other dressing (specify in notes) 5. Secondary Dressing Applied ABD Pad 7. Secured with 3 Layer Compression System - Bilateral Notes silvercell, drawtex, abd Electronic Signature(s) Signed: 12/18/2017 4:04:12 PM By: Donald Crigler Entered By: Donald Crigler on 12/16/2017 13:08:29 [name] ([id number]) -------------------------------------------------------------------------------- Wound Assessment Details Patient Name: Donald Musca C. Date of Service: 12/16/2017 12:45 PM Medical Record Number: [id number] Patient Account Number: [id number] Date of Birth/Sex: 10/29/1928 (84 y.o. Male) Treating RN: Donald Crigler Primary Care Tomicka Lover: Guerry Bruin Other Clinician: Referring Buel Molder: Guerry Bruin Treating Lindon Kiel/Extender: Maxwell Caul Weeks in Treatment: 5 Wound Status Wound Number: 11 Primary Etiology: Fungal Wound Location: Left, Dorsal Foot Wound Status: Healed - Epithelialized Wounding Event: Gradually Appeared Date Acquired: 12/02/2017 Weeks Of Treatment: 2 Clustered Wound: No Photos Photo Uploaded By: Curtis Sites on 12/16/2017 15:14:33 Wound Measurements Length: (cm) 0 Width: (cm)  0 Depth: (cm) 0 Area: (cm) 0 Volume: (cm) 0 % Reduction in Area: 100% % Reduction in Volume: 100% Wound Description Full Thickness Without Exposed Support Classification: Structures Periwound Skin Texture Texture Color No Abnormalities Noted:  No No Abnormalities Noted: No Moisture No Abnormalities Noted: No Electronic Signature(s) Signed: 12/18/2017 4:04:12 PM By: Donald Crigler Entered By: Donald Crigler on 12/16/2017 13:06:40 [name] ([id number]) -------------------------------------------------------------------------------- Wound Assessment Details Patient Name: Donald Musca C. Date of Service: 12/16/2017 12:45 PM Medical Record Number: [id number] Patient Account Number: [id number] Date of Birth/Sex: August 03, 1929 (84 y.o. Male) Treating RN: Donald Crigler Primary Care Emalea Mix: Guerry Bruin Other Clinician: Referring Harvel Meskill: Guerry Bruin Treating Marcha Licklider/Extender: Maxwell Caul Weeks in Treatment: 5 Wound Status Wound Number: 13 Primary Venous Leg Ulcer Etiology: Wound Location: Left Lower Leg - Anterior Wound Status: Open Wounding Event: Gradually Appeared Comorbid Cataracts, Arrhythmia, Deep Vein Date Acquired: 12/09/2017 History: Thrombosis, Osteoarthritis Weeks Of Treatment: 1 Clustered Wound: No Photos Photo Uploaded By: Curtis Sites on 12/16/2017 15:14:53 Wound Measurements Length: (cm) 0.1 Width: (cm) 0.1 Depth: (cm) 0.1 Area: (cm) 0.008 Volume: (cm) 0.001 % Reduction in Area: 93.2% % Reduction in Volume: 91.7% Epithelialization: None Tunneling: No Undermining: No Wound Description Full Thickness Without Exposed Support Classification: Structures Wound Margin: Flat and Intact Exudate None Present Amount: Foul Odor After Cleansing: No Slough/Fibrino Yes Wound Bed Granulation Amount: None Present (0%) Exposed Structure Necrotic Amount: Large (67-100%) Fascia Exposed: No Necrotic Quality: Eschar Fat Layer (Subcutaneous Tissue) Exposed: No Tendon Exposed: No Muscle Exposed: No Joint Exposed: No Bone Exposed: No Periwound Skin Texture Texture Color [name], [name] ([id number]) No Abnormalities Noted: No No Abnormalities Noted: No Erythema:  Yes Moisture Erythema Location: Circumferential No Abnormalities Noted: No Maceration: Yes Temperature / Pain Temperature: No Abnormality Tenderness on Palpation: Yes Wound Preparation Ulcer Cleansing: Rinsed/Irrigated with Saline, Other: soap nd water, Topical Anesthetic Applied: Other: lidocaine 4%, Treatment Notes Wound #13 (Left, Anterior Lower Leg) 1. Cleansed with: Clean wound with Normal Saline 2. Anesthetic Topical Lidocaine 4% cream to wound bed prior to debridement 4. Dressing Applied: Other dressing (specify in notes) 5. Secondary Dressing Applied ABD Pad 7. Secured with 3 Layer Compression System - Bilateral Notes silvercell, drawtex, abd Electronic Signature(s) Signed: 12/18/2017 4:04:12 PM By: Donald Crigler Entered By: Donald Crigler on 12/16/2017 13:07:48 [name] ([id number]) -------------------------------------------------------------------------------- Wound Assessment Details Patient Name: Donald Musca C. Date of Service: 12/16/2017 12:45 PM Medical Record Number: [id number] Patient Account Number: [id number] Date of Birth/Sex: Mar 17, 1929 (84 y.o. Male) Treating RN: Donald Crigler Primary Care Shaynna Husby: Guerry Bruin Other Clinician: Referring Moksha Dorgan: Guerry Bruin Treating Loisann Roach/Extender: Maxwell Caul Weeks in Treatment: 5 Wound Status Wound Number: 5 Primary Venous Leg Ulcer Etiology: Wound Location: Left Malleolus - Medial Wound Status: Open Wounding Event: Blister Comorbid Cataracts, Arrhythmia, Deep Vein Date Acquired: 10/28/2017 History: Thrombosis, Osteoarthritis Weeks Of Treatment: 5 Clustered Wound: No Photos Photo Uploaded By: Curtis Sites on 12/16/2017 15:14:54 Wound Measurements Length: (cm) 0.8 Width: (cm) 1.2 Depth: (cm) 0.1 Area: (cm) 0.754 Volume: (cm) 0.075 % Reduction in Area: -219.5% % Reduction in Volume: -212.5% Epithelialization: None Tunneling: No Undermining: No Wound  Description Full Thickness Without Exposed Support Classification: Structures Wound Margin: Flat and Intact Exudate Large Amount: Exudate Type: Serosanguineous Exudate Color: red, brown Foul Odor After Cleansing: No Slough/Fibrino Yes Wound Bed Granulation Amount: Medium (34-66%) Exposed Structure Granulation Quality: Red Fascia Exposed: No Necrotic Amount: Medium (34-66%) Fat Layer (Subcutaneous Tissue) Exposed: Yes Necrotic Quality: Adherent Slough Tendon Exposed: No Muscle  Exposed: No Joint Exposed: No Bone Exposed: No [name], [name] ([id number]) Periwound Skin Texture Texture Color No Abnormalities Noted: No No Abnormalities Noted: No Callus: No Atrophie Blanche: No Crepitus: No Cyanosis: No Excoriation: No Ecchymosis: No Induration: No Erythema: Yes Rash: No Erythema Location: Circumferential Scarring: No Hemosiderin Staining: No Mottled: No Moisture Pallor: No No Abnormalities Noted: No Rubor: No Dry / Scaly: No Maceration: No Temperature / Pain Temperature: No Abnormality Tenderness on Palpation: Yes Wound Preparation Ulcer Cleansing: Rinsed/Irrigated with Saline, Other: soap and water, Topical Anesthetic Applied: Other: lidocaine 4%, Treatment Notes Wound #5 (Left, Medial Malleolus) 1. Cleansed with: Clean wound with Normal Saline 2. Anesthetic Topical Lidocaine 4% cream to wound bed prior to debridement 4. Dressing Applied: Other dressing (specify in notes) 5. Secondary Dressing Applied ABD Pad 7. Secured with 3 Layer Compression System - Bilateral Notes silvercell, drawtex, abd Electronic Signature(s) Signed: 12/18/2017 4:04:12 PM By: Donald Crigler Entered By: Donald Crigler on 12/16/2017 13:09:21 [name] ([id number]) -------------------------------------------------------------------------------- Wound Assessment Details Patient Name: Donald Musca C. Date of Service: 12/16/2017 12:45 PM Medical Record Number:  [id number] Patient Account Number: [id number] Date of Birth/Sex: [birth year] (84 y.o. Male) Treating RN: Donald Crigler Primary Care Charlea Nardo: Guerry Bruin Other Clinician: Referring Jayliana Valencia: Guerry Bruin Treating Bennetta Rudden/Extender: Maxwell Caul Weeks in Treatment: 5 Wound Status Wound Number: 7 Primary Etiology: Venous Leg Ulcer Wound Location: Right, Anterior Lower Leg Wound Status: Healed - Epithelialized Wounding Event: Gradually Appeared Date Acquired: 10/28/2017 Weeks Of Treatment: 5 Clustered Wound: No Photos Photo Uploaded By: Curtis Sites on 12/16/2017 15:15:15 Wound Measurements Length: (cm) 0 Width: (cm) 0 Depth: (cm) 0 Area: (cm) 0 Volume: (cm) 0 % Reduction in Area: 100% % Reduction in Volume: 100% Wound Description Full Thickness Without Exposed Support Classification: Structures Periwound Skin Texture Texture Color No Abnormalities Noted: No No Abnormalities Noted: No Moisture No Abnormalities Noted: No Electronic Signature(s) Signed: 12/18/2017 4:04:12 PM By: Donald Crigler Entered By: Donald Crigler on 12/16/2017 13:09:45 [name] ([id number]) -------------------------------------------------------------------------------- Vitals Details Patient Name: Donald Musca C. Date of Service: 12/16/2017 12:45 PM Medical Record Number: [id number] Patient Account Number: [id number] Date of Birth/Sex: 09-Jan-1929 (84 y.o. Male) Treating RN: Donald Crigler Primary Care Alekzander Cardell: Guerry Bruin Other Clinician: Referring Desi Rowe: Guerry Bruin Treating Jadore Mcguffin/Extender: [place] in Treatment: 5 Vital Signs Time Taken: 12:54 Temperature (F): 98.1 Height (in): 68 Pulse (bpm): 69 Weight (lbs): 150 Respiratory Rate (breaths/min): 16 Body Mass Index (BMI): 22.8 Blood Pressure (mmHg): 104/64 Reference Range: 80 - 120 mg / dl Electronic Signature(s) Signed: 12/18/2017 4:04:12 PM By: Donald Crigler Entered By: Donald Crigler on 12/16/2017 12:54:33

## 2017-12-21 ENCOUNTER — Telehealth: Payer: Self-pay | Admitting: Internal Medicine

## 2017-12-21 DIAGNOSIS — L97221 Non-pressure chronic ulcer of left calf limited to breakdown of skin: Secondary | ICD-10-CM | POA: Diagnosis not present

## 2017-12-21 DIAGNOSIS — I10 Essential (primary) hypertension: Secondary | ICD-10-CM | POA: Diagnosis not present

## 2017-12-21 DIAGNOSIS — I87333 Chronic venous hypertension (idiopathic) with ulcer and inflammation of bilateral lower extremity: Secondary | ICD-10-CM | POA: Diagnosis not present

## 2017-12-21 DIAGNOSIS — L97211 Non-pressure chronic ulcer of right calf limited to breakdown of skin: Secondary | ICD-10-CM | POA: Diagnosis not present

## 2017-12-21 DIAGNOSIS — I4891 Unspecified atrial fibrillation: Secondary | ICD-10-CM | POA: Diagnosis not present

## 2017-12-21 DIAGNOSIS — L97321 Non-pressure chronic ulcer of left ankle limited to breakdown of skin: Secondary | ICD-10-CM | POA: Diagnosis not present

## 2017-12-21 NOTE — Telephone Encounter (Signed)
Spoke to wife regarding patient's monitor. Patient's wife states that she spoke to someone with [employer] who will be sending her a new monitor. She said that they told her it would arrive in ~5 days. Patient/wife was instructed to call them when it's received. Patient/wife did not require any further assistance at this time.

## 2017-12-21 NOTE — Telephone Encounter (Signed)
New Message     1. Has your device fired? no  2. Is you device beeping? no  3. Are you experiencing draining or swelling at device site? no  4. Are you calling to see if we received your device transmission? no  5. Have you passed out? No    Patients spouse needs assistance on hold to send a transmission. Please call.  Please route to Device Clinic Pool

## 2017-12-21 NOTE — Telephone Encounter (Signed)
See phone note from 12/21/17 

## 2017-12-23 ENCOUNTER — Encounter: Payer: Medicare Other | Admitting: Internal Medicine

## 2017-12-23 DIAGNOSIS — I87333 Chronic venous hypertension (idiopathic) with ulcer and inflammation of bilateral lower extremity: Secondary | ICD-10-CM | POA: Diagnosis not present

## 2017-12-23 DIAGNOSIS — I872 Venous insufficiency (chronic) (peripheral): Secondary | ICD-10-CM | POA: Diagnosis not present

## 2017-12-23 DIAGNOSIS — I87311 Chronic venous hypertension (idiopathic) with ulcer of right lower extremity: Secondary | ICD-10-CM | POA: Diagnosis not present

## 2017-12-23 DIAGNOSIS — L97211 Non-pressure chronic ulcer of right calf limited to breakdown of skin: Secondary | ICD-10-CM | POA: Diagnosis not present

## 2017-12-23 DIAGNOSIS — Z7901 Long term (current) use of anticoagulants: Secondary | ICD-10-CM | POA: Diagnosis not present

## 2017-12-23 DIAGNOSIS — L97221 Non-pressure chronic ulcer of left calf limited to breakdown of skin: Secondary | ICD-10-CM | POA: Diagnosis not present

## 2017-12-23 DIAGNOSIS — L97312 Non-pressure chronic ulcer of right ankle with fat layer exposed: Secondary | ICD-10-CM | POA: Diagnosis not present

## 2017-12-23 DIAGNOSIS — I771 Stricture of artery: Secondary | ICD-10-CM | POA: Diagnosis not present

## 2017-12-25 DIAGNOSIS — L97221 Non-pressure chronic ulcer of left calf limited to breakdown of skin: Secondary | ICD-10-CM | POA: Diagnosis not present

## 2017-12-25 DIAGNOSIS — I87333 Chronic venous hypertension (idiopathic) with ulcer and inflammation of bilateral lower extremity: Secondary | ICD-10-CM | POA: Diagnosis not present

## 2017-12-25 DIAGNOSIS — L97321 Non-pressure chronic ulcer of left ankle limited to breakdown of skin: Secondary | ICD-10-CM | POA: Diagnosis not present

## 2017-12-25 DIAGNOSIS — I4891 Unspecified atrial fibrillation: Secondary | ICD-10-CM | POA: Diagnosis not present

## 2017-12-25 DIAGNOSIS — L97211 Non-pressure chronic ulcer of right calf limited to breakdown of skin: Secondary | ICD-10-CM | POA: Diagnosis not present

## 2017-12-25 DIAGNOSIS — I10 Essential (primary) hypertension: Secondary | ICD-10-CM | POA: Diagnosis not present

## 2017-12-28 DIAGNOSIS — I4891 Unspecified atrial fibrillation: Secondary | ICD-10-CM | POA: Diagnosis not present

## 2017-12-28 DIAGNOSIS — I87333 Chronic venous hypertension (idiopathic) with ulcer and inflammation of bilateral lower extremity: Secondary | ICD-10-CM | POA: Diagnosis not present

## 2017-12-28 DIAGNOSIS — I10 Essential (primary) hypertension: Secondary | ICD-10-CM | POA: Diagnosis not present

## 2017-12-28 DIAGNOSIS — L97221 Non-pressure chronic ulcer of left calf limited to breakdown of skin: Secondary | ICD-10-CM | POA: Diagnosis not present

## 2017-12-28 DIAGNOSIS — L97321 Non-pressure chronic ulcer of left ankle limited to breakdown of skin: Secondary | ICD-10-CM | POA: Diagnosis not present

## 2017-12-28 DIAGNOSIS — L97211 Non-pressure chronic ulcer of right calf limited to breakdown of skin: Secondary | ICD-10-CM | POA: Diagnosis not present

## 2017-12-28 NOTE — Progress Notes (Signed)
STARR, ENGEL ([id number]) Visit Report for 12/23/2017 Debridement Details Patient Name: [name], [name]. Date of Service: 12/23/2017 2:30 PM Medical Record Number: [id number] Patient Account Number: [id number] Date of Birth/Sex: 01/18/1929 (84 y.o. M) Treating RN: Huel Coventry Primary Care Provider: Guerry Potts Other Clinician: Referring Provider: Guerry Potts Treating Provider/Extender: Altamese [place] in Treatment: 6 Debridement Performed for Wound #10 Right,Medial Malleolus Assessment: Performed By: Physician Maxwell Caul, MD Debridement Type: Debridement Severity of Tissue Pre Fat layer exposed Debridement: Pre-procedure Verification/Time Yes - 15:30 Out Taken: Start Time: 15:30 Pain Control: Other : lidocaine 4% Total Area Debrided (L x W): 0.3 (cm) x 0.3 (cm) = 0.09 (cm) Tissue and other material Viable, Non-Viable, Slough, Subcutaneous, Skin: Dermis , Slough debrided: Level: Skin/Subcutaneous Tissue Debridement Description: Excisional Instrument: Curette Bleeding: Minimum Hemostasis Achieved: Pressure End Time: 15:35 Procedural Pain: 0 Post Procedural Pain: 0 Response to Treatment: Procedure was tolerated well Post Debridement Measurements of Total Wound Length: (cm) 0.3 Width: (cm) 0.5 Depth: (cm) 0.2 Volume: (cm) 0.024 Character of Wound/Ulcer Post Debridement: Stable Severity of Tissue Post Debridement: Fat layer exposed Post Procedure Diagnosis Same as Pre-procedure Electronic Signature(s) Signed: 12/23/2017 5:09:03 PM By: Baltazar Najjar MD Signed: 12/23/2017 5:11:52 PM By: Elliot Gurney, [name], RN, CWS, [name], [name] Entered By: Baltazar Najjar on 12/23/2017 16:56:11 [name], [name] ([id number]) -------------------------------------------------------------------------------- HPI Details Patient Name: Donald Musca C. Date of Service: 12/23/2017 2:30 PM Medical Record Number: [id number] Patient Account Number: [id number] Date of  Birth/Sex: 1928/09/21 (84 y.o. M) Treating RN: Huel Coventry Primary Care Provider: Guerry Potts Other Clinician: Referring Provider: Guerry Potts Treating Provider/Extender: Altamese [place] in Treatment: 6 History of Present Illness HPI Description: Pleasant 84 year old with history of chronic venous insufficiency, remote history of DVT, atrial fibrillation (on Coumadin). No history of diabetes. He says that he developed worsening left lower extremity swelling, blisters, and subsequent ulcerations in late October 2016. He was started on Augmentin for left lower extremity cellulitis, which has improved. Arterial ultrasound 07/27/2015 showed no significant peripheral arterial disease. Awaiting venous ultrasound. Performing dressing changes with silver alginate and using a Tubigrip for edema control. He denies any significant pain. No claudication or rest pain. Ambulating per his baseline with a walker. No fever or chills. Moderate drainage. 10/10/15; the patient arrives back today having not been seen in almost 2 months. He has no open wounds on his legs. He has a history of venous insufficiency, arterial insufficiency. He has chronic Coumadin use secondary to atrial fibrillation and a remote history of DVT. Fortunately as he has not been here in 2 months we were not able to order him stockings. He is not able to get on standard compression hose. Juxtalite stockings would cost him probably about $100 appear, the couple was not able to afford this. 11/11/17 READMISSION This is an 84 year old man who is not a diabetic. He does have a history of sick sinus syndrome atrial fibrillation and has a cardiac pacemaker and is on Coumadin.he also has a history of DVT. His wife states that episodically over the last 2 years he has had wounds on his lower extremities but they have healed with home measures. Roughly 2-3 weeks ago the legs became a lot more swollen and he has had draining areas on  the right anterior medial and lateral and the left medial leg. The patient's wife states that they were told to put support stockings on him but they are concerned because they worsen the blisters. this bit difficult to  get the history but they think the edema has been increasing over the last several months. 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 He does not complain of chest pain orthopnea. He does not have a history of kidney issues that he is aware. Primary doctor at [facility]. He is not on diuretics. He was here 2 years ago. He was noted to have edema in his legs at that time. He had an ultrasound of his legs which I'll need to review. There was discussion about compression stockings but he is complaining clearly not using them. ABI in the right in this clinic was 1.37, 1.07 on the left 11/18/17; this is an elderly frail man who has bilateral small lower extremity wounds in the setting of very significant multifactorial edema. He probably has chronic venous insufficiency with lymphedema but it is clearly fluid overloaded last week. He apparently has started on diuretics through his primary physician at [facility] but the patient's wife is not sure which one but states that he goes to the bathroom every 15 minutes. He is tolerating compression wraps. His wife stated that they charged him full price stating that he didn't have open wounds on his legs that would cover wraparound stockings. His edema is much better controlled 11/25/17-he is here in follow-up evaluation for bilateral lower extremity ulcers. He is tolerating compression therapy. There was one episode since last appointment with copious amounts of drainage from the right medial malleolus. We will add additional absorptive dressing, otherwise no change in treatment plan and he will follow-up next week 12/02/17; since I last saw this man is edema control is a lot better and I  think this is a combination of diuretic therapy and are 3 layer compression. He has severe bilateral venous insufficiency with hemosiderin deposition and likely chronic stasis dermatitis/inflammation. In spite of the edema control he has several weeping areas on both legs including the left lateral leg, area on the right posterior leg. He also has an excoriated area just below his right tibial tuberosity. Fortuitously the patient has a follow-up with cardiology tomorrow 12/09/17; his edema control is excellent now on 40 mg of Lasix on his cardiologist last week and according to his wife was thoroughly evaluated including checking his pacemaker and no medication changes are made. He has good edema control in his lower legs. He has significant chronic venous insufficiency with hemosiderin physician as well as stasis dermatitis. major areas on the medial malleoli [place], Ronen C. ([id number]) 12/16/17; he has excellent edema control however he is still having a lot of drainage and a lot of discomfort on the medial part of his right ankle/calf. The only other wound of any substances on the left medial malleolus. He is not been systemically unwell. 12/23/17; he has excellent edema control however he still has weeping edema fluid coming out of the medial part of the right ankle. The exact cause of this is unclear I gave him antibiotics last week however I don't really think that this is made a lot of difference. The other wound is on the left medial malleolus. This required debridement Electronic Signature(s) Signed: 12/23/2017 5:09:03 PM By: Baltazar Najjar MD Entered By: Baltazar Najjar on 12/23/2017 16:57:07 [name] ([id number]) -------------------------------------------------------------------------------- Physical Exam Details Patient Name: Donald Musca C. Date of Service: 12/23/2017 2:30 PM Medical Record Number: [id number] Patient Account Number: [id number] Date of Birth/Sex:  29-Jun-1929 (84 y.o. M) Treating RN: Huel Coventry Primary Care  Provider: Guerry Potts Other Clinician: Referring Provider: Guerry Potts Treating Provider/Extender: Altamese [place] in Treatment: 6 Constitutional Sitting or standing Blood Pressure is within target range for patient.. Pulse regular and within target range for patient.Marland Kitchen Respirations regular, non-labored and within target range.. Temperature is normal and within the target range for the patient.Marland Kitchen appears in no distress. Notes wound exam oOpen area predominantly includes the right medial ankle/malleolus. This is still has weeping edema fluid even though the edema appears to be well controlled. There is no real evidence of surrounding infection oThe other areas on the left medial malleolus. This is more superficial but had nonviable callus and eschar around the wound that I removed and subcutaneous tissue over the wound surface. Post debridement this actually looked fairly healthy. Hemostasis with direct pressure Electronic Signature(s) Signed: 12/23/2017 5:09:03 PM By: Baltazar Najjar MD Entered By: Baltazar Najjar on 12/23/2017 16:58:19 [name] ([id number]) -------------------------------------------------------------------------------- Physician Orders Details Patient Name: [name]. Date of Service: 12/23/2017 2:30 PM Medical Record Number: [id number] Patient Account Number: [id number] Date of Birth/Sex: December 07, 1928 (84 y.o. M) Treating RN: Huel Coventry Primary Care Provider: Guerry Potts Other Clinician: Referring Provider: Guerry Potts Treating Provider/Extender: Altamese [place] in Treatment: 6 Verbal / Phone Orders: No Diagnosis Coding Wound Cleansing Wound #10 Right,Medial Malleolus o Cleanse wound with mild soap and water - HHRN please wash legs Wound #13 Left,Anterior Lower Leg o Cleanse wound with mild soap and water - HHRN please wash legs Wound #5 Left,Medial  Malleolus o Cleanse wound with mild soap and water - HHRN please wash legs Anesthetic (add to Medication List) Wound #10 Right,Medial Malleolus o Topical Lidocaine 4% cream applied to wound bed prior to debridement (In Clinic Only). Wound #13 Left,Anterior Lower Leg o Topical Lidocaine 4% cream applied to wound bed prior to debridement (In Clinic Only). Wound #5 Left,Medial Malleolus o Topical Lidocaine 4% cream applied to wound bed prior to debridement (In Clinic Only). Skin Barriers/Peri-Wound Care Wound #10 Right,Medial Malleolus o Moisturizing lotion - HHRN to moisturize o Other: - OTC Lotrimin Cream on top of right foot. Wound #13 Left,Anterior Lower Leg o Moisturizing lotion - HHRN to moisturize o Other: - OTC Lotrimin Cream on top of right foot. Wound #5 Left,Medial Malleolus o Moisturizing lotion - HHRN to moisturize o Other: - OTC Lotrimin Cream on top of right foot. Primary Wound Dressing Wound #10 Right,Medial Malleolus o Silver Alginate Wound #13 Left,Anterior Lower Leg o Silver Alginate Wound #5 Left,Medial Malleolus o Silver Alginate Secondary Dressing Medders, Rishabh C. ([id number]) Wound #10 Right,Medial Malleolus o ABD pad - cover all wounds o Foam - Reddened area at top of wraps bilaterally o Drawtex - if needed for drainage Wound #13 Left,Anterior Lower Leg o ABD pad - cover all wounds o Foam - Reddened area at top of wraps bilaterally o Drawtex - if needed for drainage Wound #5 Left,Medial Malleolus o ABD pad - cover all wounds o Foam - Reddened area at top of wraps bilaterally o Drawtex - if needed for drainage Dressing Change Frequency Wound #10 Right,Medial Malleolus o Change Dressing Monday, Wednesday, Friday Wound #13 Left,Anterior Lower Leg o Change Dressing Monday, Wednesday, Friday Wound #5 Left,Medial Malleolus o Change Dressing Monday, Wednesday, Friday Follow-up Appointments Wound #10  Right,Medial Malleolus o Return Appointment in 1 week. Wound #13 Left,Anterior Lower Leg o Return Appointment in 1 week. Wound #5 Left,Medial Malleolus o Return Appointment in 1 week. Edema Control Wound #10 Right,Medial Malleolus o 3 Layer  Compression System - Bilateral - Please do not place coban directly on legs or roll down tops of wraps. o Elevate legs to the level of the heart and pump ankles as often as possible Wound #13 Left,Anterior Lower Leg o 3 Layer Compression System - Bilateral - Please do not place coban directly on legs or roll down tops of wraps. o Elevate legs to the level of the heart and pump ankles as often as possible Wound #5 Left,Medial Malleolus o 3 Layer Compression System - Bilateral - Please do not place coban directly on legs or roll down tops of wraps. o Elevate legs to the level of the heart and pump ankles as often as possible Additional Orders / Instructions Wound #10 Right,Medial Malleolus o Vitamin A; Vitamin C, Zinc o Activity as tolerated Wound #13 Left,Anterior Lower Leg o Vitamin A; Vitamin C, Zinc [name], [name] ([id number]) o Activity as tolerated Wound #5 Left,Medial Malleolus o Vitamin A; Vitamin C, Zinc o Activity as tolerated Home Health Wound #10 Right,Medial Malleolus o Continue Home Health Visits o Home Health Nurse may visit PRN to address patientos wound care needs. o FACE TO FACE ENCOUNTER: MEDICARE and MEDICAID PATIENTS: I certify that this patient is under my care and that I had a face-to-face encounter that meets the physician face-to-face encounter requirements with this patient on this date. The encounter with the patient was in whole or in part for the following MEDICAL CONDITION: (primary reason for Home Healthcare) MEDICAL NECESSITY: I certify, that based on my findings, NURSING services are a medically necessary home health service. HOME BOUND STATUS: I certify that my clinical  findings support that this patient is homebound (i.e., Due to illness or injury, pt requires aid of supportive devices such as crutches, cane, wheelchairs, walkers, the use of special transportation or the assistance of another person to leave their place of residence. There is a normal inability to leave the home and doing so requires considerable and taxing effort. Other absences are for medical reasons / religious services and are infrequent or of short duration when for other reasons). o If current dressing causes regression in wound condition, may D/C ordered dressing product/s and apply Normal Saline Moist Dressing daily until next Wound Healing Center / Other MD appointment. Notify Wound Healing Center of regression in wound condition at [phone]. o Please direct any NON-WOUND related issues/requests for orders to patient's Primary Care Physician Wound #13 Left,Anterior Lower Leg o Continue Home Health Visits o Home Health Nurse may visit PRN to address patientos wound care needs. o FACE TO FACE ENCOUNTER: MEDICARE and MEDICAID PATIENTS: I certify that this patient is under my care and that I had a face-to-face encounter that meets the physician face-to-face encounter requirements with this patient on this date. The encounter with the patient was in whole or in part for the following MEDICAL CONDITION: (primary reason for Home Healthcare) MEDICAL NECESSITY: I certify, that based on my findings, NURSING services are a medically necessary home health service. HOME BOUND STATUS: I certify that my clinical findings support that this patient is homebound (i.e., Due to illness or injury, pt requires aid of supportive devices such as crutches, cane, wheelchairs, walkers, the use of special transportation or the assistance of another person to leave their place of residence. There is a normal inability to leave the home and doing so requires considerable and taxing effort. Other  absences are for medical reasons / religious services and are infrequent or of short duration when  for other reasons). o If current dressing causes regression in wound condition, may D/C ordered dressing product/s and apply Normal Saline Moist Dressing daily until next Wound Healing Center / Other MD appointment. Notify Wound Healing Center of regression in wound condition at [phone]. o Please direct any NON-WOUND related issues/requests for orders to patient's Primary Care Physician Wound #5 Left,Medial Malleolus o Continue Home Health Visits o Home Health Nurse may visit PRN to address patientos wound care needs. o FACE TO FACE ENCOUNTER: MEDICARE and MEDICAID PATIENTS: I certify that this patient is under my care and that I had a face-to-face encounter that meets the physician face-to-face encounter requirements with this patient on this date. The encounter with the patient was in whole or in part for the following MEDICAL CONDITION: (primary reason for Home Healthcare) MEDICAL NECESSITY: I certify, that based on my findings, NURSING services are a medically necessary home health service. HOME BOUND STATUS: I certify that my clinical findings support that this patient is homebound (i.e., Due to illness or injury, pt requires aid of supportive devices such as crutches, cane, wheelchairs, walkers, the use of special transportation or the assistance of another person to leave their place of residence. There is a normal inability to leave the home and doing so requires considerable and taxing effort. Other absences are for medical reasons / religious services and are infrequent or of short duration when for other reasons). [name], [name] ([id number]) o If current dressing causes regression in wound condition, may D/C ordered dressing product/s and apply Normal Saline Moist Dressing daily until next Wound Healing Center / Other MD appointment. Notify Wound Healing Center of  regression in wound condition at [phone]. o Please direct any NON-WOUND related issues/requests for orders to patient's Primary Care Physician Electronic Signature(s) Signed: 12/23/2017 5:09:03 PM By: Baltazar Najjar MD Signed: 12/23/2017 5:11:52 PM By: Elliot Gurney, [name], RN, CWS, [name], [name] Entered By: Elliot Gurney, [name], RN, CWS, Kim on 12/23/2017 15:35:25 [name] ([id number]) -------------------------------------------------------------------------------- Problem List Details Patient Name: DARRYLE, DENNIE C. Date of Service: 12/23/2017 2:30 PM Medical Record Number: [id number] Patient Account Number: [id number] Date of Birth/Sex: Apr 21, 1929 (84 y.o. M) Treating RN: Huel Coventry Primary Care Provider: Guerry Potts Other Clinician: Referring Provider: Guerry Potts Treating Provider/Extender: Altamese [place] in Treatment: 6 Active Problems ICD-10 Impacting Encounter Code Description Active Date Wound Healing Diagnosis L97.211 Non-pressure chronic ulcer of right calf limited to breakdown 11/11/2017 Yes of skin L97.221 Non-pressure chronic ulcer of left calf limited to breakdown of 11/11/2017 Yes skin I87.333 Chronic venous hypertension (idiopathic) with ulcer and 11/11/2017 Yes inflammation of bilateral lower extremity Inactive Problems Resolved Problems Electronic Signature(s) Signed: 12/23/2017 5:09:03 PM By: Baltazar Najjar MD Entered By: Baltazar Najjar on 12/23/2017 16:55:17 [name], [name] ([id number]) -------------------------------------------------------------------------------- Progress Note Details Patient Name: Donald Musca C. Date of Service: 12/23/2017 2:30 PM Medical Record Number: [id number] Patient Account Number: [id number] Date of Birth/Sex: Feb 02, 1929 (84 y.o. M) Treating RN: Huel Coventry Primary Care Provider: Guerry Potts Other Clinician: Referring Provider: Guerry Potts Treating Provider/Extender: Altamese [place] in  Treatment: 6 Subjective History of Present Illness (HPI) Pleasant 84 year old with history of chronic venous insufficiency, remote history of DVT, atrial fibrillation (on Coumadin). No history of diabetes. He says that he developed worsening left lower extremity swelling, blisters, and subsequent ulcerations in late October 2016. He was started on Augmentin for left lower extremity cellulitis, which has improved. Arterial ultrasound 07/27/2015 showed no significant peripheral arterial disease. Awaiting venous ultrasound. Performing dressing  changes with silver alginate and using a Tubigrip for edema control. He denies any significant pain. No claudication or rest pain. Ambulating per his baseline with a walker. No fever or chills. Moderate drainage. 10/10/15; the patient arrives back today having not been seen in almost 2 months. He has no open wounds on his legs. He has a history of venous insufficiency, arterial insufficiency. He has chronic Coumadin use secondary to atrial fibrillation and a remote history of DVT. Fortunately as he has not been here in 2 months we were not able to order him stockings. He is not able to get on standard compression hose. Juxtalite stockings would cost him probably about $100 appear, the couple was not able to afford this. 11/11/17 READMISSION This is an 84 year old man who is not a diabetic. He does have a history of sick sinus syndrome atrial fibrillation and has a cardiac pacemaker and is on Coumadin.he also has a history of DVT. His wife states that episodically over the last 2 years he has had wounds on his lower extremities but they have healed with home measures. Roughly 2-3 weeks ago the legs became a lot more swollen and he has had draining areas on the right anterior medial and lateral and the left medial leg. The patient's wife states that they were told to put support stockings on him but they are concerned because they worsen the blisters. this bit  difficult to get the history but they think the edema has been increasing over the last several months. 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 He does not complain of chest pain orthopnea. He does not have a history of kidney issues that he is aware. Primary doctor at [facility]. He is not on diuretics. He was here 2 years ago. He was noted to have edema in his legs at that time. He had an ultrasound of his legs which I'll need to review. There was discussion about compression stockings but he is complaining clearly not using them. ABI in the right in this clinic was 1.37, 1.07 on the left 11/18/17; this is an elderly frail man who has bilateral small lower extremity wounds in the setting of very significant multifactorial edema. He probably has chronic venous insufficiency with lymphedema but it is clearly fluid overloaded last week. He apparently has started on diuretics through his primary physician at [facility] but the patient's wife is not sure which one but states that he goes to the bathroom every 15 minutes. He is tolerating compression wraps. His wife stated that they charged him full price stating that he didn't have open wounds on his legs that would cover wraparound stockings. His edema is much better controlled 11/25/17-he is here in follow-up evaluation for bilateral lower extremity ulcers. He is tolerating compression therapy. There was one episode since last appointment with copious amounts of drainage from the right medial malleolus. We will add additional absorptive dressing, otherwise no change in treatment plan and he will follow-up next week 12/02/17; since I last saw this man is edema control is a lot better and I think this is a combination of diuretic therapy and are 3 layer compression. He has severe bilateral venous insufficiency with hemosiderin deposition and likely chronic stasis dermatitis/inflammation. In  spite of the edema control he has several weeping areas on both legs including the left lateral leg, area on the right posterior leg. He also has an excoriated area just below his  right tibial tuberosity. Fortuitously the patient has a follow-up with cardiology tomorrow 12/09/17; his edema control is excellent now on 40 mg of Lasix on his cardiologist last week and according to his wife was thoroughly evaluated including checking his pacemaker and no medication changes are made. He has good edema control in his lower legs. He has significant chronic venous insufficiency with hemosiderin physician as well as stasis dermatitis. major Donald MouseWILLIFORD, [name] ([id number]) areas on the medial malleoli 12/16/17; he has excellent edema control however he is still having a lot of drainage and a lot of discomfort on the medial part of his right ankle/calf. The only other wound of any substances on the left medial malleolus. He is not been systemically unwell. 12/23/17; he has excellent edema control however he still has weeping edema fluid coming out of the medial part of the right ankle. The exact cause of this is unclear I gave him antibiotics last week however I don't really think that this is made a lot of difference. The other wound is on the left medial malleolus. This required debridement Objective Constitutional Sitting or standing Blood Pressure is within target range for patient.. Pulse regular and within target range for patient.Marland Kitchen. Respirations regular, non-labored and within target range.. Temperature is normal and within the target range for the patient.Marland Kitchen. appears in no distress. Vitals Time Taken: 3:43 PM, Height: 68 in, Weight: 150 lbs, BMI: 22.8, Temperature: 98.2 F, Pulse: 82 bpm, Respiratory Rate: 16 breaths/min, Blood Pressure: 109/78 mmHg. General Notes: wound exam Open area predominantly includes the right medial ankle/malleolus. This is still has weeping edema fluid even though the edema  appears to be well controlled. There is no real evidence of surrounding infection The other areas on the left medial malleolus. This is more superficial but had nonviable callus and eschar around the wound that I removed and subcutaneous tissue over the wound surface. Post debridement this actually looked fairly healthy. Hemostasis with direct pressure Integumentary (Hair, Skin) Wound #10 status is Open. Original cause of wound was Gradually Appeared. The wound is located on the Right,Medial Malleolus. The wound measures 0.3cm length x 0.3cm width x 0.1cm depth; 0.071cm^2 area and 0.007cm^3 volume. There is no tunneling or undermining noted. There is a large amount of serous drainage noted. The wound margin is distinct with the outline attached to the wound base. There is large (67-100%) red granulation within the wound bed. There is a small (1-33%) amount of necrotic tissue within the wound bed including Adherent Slough. The periwound skin appearance did not exhibit: Callus, Crepitus, Excoriation, Induration, Rash, Scarring, Dry/Scaly, Maceration, Atrophie Blanche, Cyanosis, Ecchymosis, Hemosiderin Staining, Mottled, Pallor, Rubor, Erythema. Periwound temperature was noted as No Abnormality. The periwound has tenderness on palpation. Wound #13 status is Open. Original cause of wound was Gradually Appeared. The wound is located on the Left,Anterior Lower Leg. The wound measures 0.1cm length x 0.1cm width x 0.1cm depth; 0.008cm^2 area and 0.001cm^3 volume. Wound #5 status is Open. Original cause of wound was Blister. The wound is located on the Left,Medial Malleolus. The wound measures 0.8cm length x 1cm width x 0.1cm depth; 0.628cm^2 area and 0.063cm^3 volume. There is Fat Layer (Subcutaneous Tissue) Exposed exposed. There is no tunneling or undermining noted. There is a large amount of serosanguineous drainage noted. The wound margin is flat and intact. There is medium (34-66%) red granulation  within the wound bed. There is a medium (34-66%) amount of necrotic tissue within the wound bed including Eschar and Adherent Slough.  The periwound skin appearance exhibited: Erythema. The periwound skin appearance did not exhibit: Callus, Crepitus, Excoriation, Induration, Rash, Scarring, Dry/Scaly, Maceration, Atrophie Blanche, Cyanosis, Ecchymosis, Hemosiderin Staining, Mottled, Pallor, Rubor. The surrounding wound skin color is noted with erythema which is circumferential. Periwound temperature was noted as No Abnormality. The periwound has tenderness on palpation. Wound #6 status is Healed - Epithelialized. Original cause of wound was Blister. The wound is located on the [place], Kionte C. ([id number]) Lower Leg. The wound measures 0cm length x 0cm width x 0cm depth; 0cm^2 area and 0cm^3 volume. 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 There was a Excisional Skin/Subcutaneous Tissue Debridement with a total area of 0.09 sq cm performed by Maxwell Caul, MD. With the following instrument(s): Curette. to remove Viable and Non-Viable tissue/material Material removed includes Subcutaneous Tissue, and Slough, Skin: Dermis, and Slough after achieving pain control using Other (lidocaine 4%). No specimens were taken. A time out was conducted at 15:30, prior to the start of the procedure. A Minimum amount of bleeding was controlled with Pressure. The procedure was tolerated well with a pain level of 0 throughout and a pain level of 0 following the  procedure. Post Debridement Measurements: 0.3cm length x 0.5cm width x 0.2cm depth; 0.024cm^3 volume. Character of Wound/Ulcer Post Debridement is stable. Severity of Tissue Post Debridement is: Fat layer exposed. Post procedure Diagnosis Wound #10: Same as Pre-Procedure Plan Wound Cleansing: Wound #10 Right,Medial Malleolus: Cleanse wound with mild soap and water - HHRN please wash legs Wound #13 Left,Anterior Lower Leg: Cleanse wound with mild soap and water - HHRN please wash legs Wound #5 Left,Medial Malleolus: Cleanse wound with mild soap and water - HHRN please wash legs Anesthetic (add to Medication List): Wound #10 Right,Medial Malleolus: Topical Lidocaine 4% cream applied to wound bed prior to debridement (In Clinic Only). Wound #13 Left,Anterior Lower Leg: Topical Lidocaine 4% cream applied to wound bed prior to debridement (In Clinic Only). Wound #5 Left,Medial Malleolus: Topical Lidocaine 4% cream applied to wound bed prior to debridement (In Clinic Only). Skin Barriers/Peri-Wound Care: Wound #10 Right,Medial Malleolus: Moisturizing lotion - HHRN to moisturize Other: - OTC Lotrimin Cream on top of right foot. [name], [name] ([id number]) Wound #13 Left,Anterior Lower Leg: Moisturizing lotion - HHRN to moisturize Other: - OTC Lotrimin Cream on top of right foot. Wound #5 Left,Medial Malleolus: Moisturizing lotion - HHRN to moisturize Other: - OTC Lotrimin Cream on top of right foot. Primary Wound Dressing: Wound #10 Right,Medial Malleolus: Silver Alginate Wound #13 Left,Anterior Lower Leg: Silver Alginate Wound #5 Left,Medial Malleolus: Silver Alginate Secondary Dressing: Wound #10 Right,Medial Malleolus: ABD pad - cover all wounds Foam - Reddened area at top of wraps bilaterally Drawtex - if needed for drainage Wound #13 Left,Anterior Lower Leg: ABD pad - cover all wounds Foam - Reddened area at top of wraps bilaterally Drawtex - if needed for  drainage Wound #5 Left,Medial Malleolus: ABD pad - cover all wounds Foam - Reddened area at top of wraps bilaterally Drawtex - if needed for drainage Dressing Change Frequency: Wound #10 Right,Medial Malleolus: Change Dressing Monday, Wednesday, Friday Wound #13  Left,Anterior Lower Leg: Change Dressing Monday, Wednesday, Friday Wound #5 Left,Medial Malleolus: Change Dressing Monday, Wednesday, Friday Follow-up Appointments: Wound #10 Right,Medial Malleolus: Return Appointment in 1 week. Wound #13 Left,Anterior Lower Leg: Return Appointment in 1 week. Wound #5 Left,Medial Malleolus: Return Appointment in 1 week. Edema Control: Wound #10 Right,Medial Malleolus: 3 Layer Compression System - Bilateral - Please do not place coban directly on legs or roll down tops of wraps. Elevate legs to the level of the heart and pump ankles as often as possible Wound #13 Left,Anterior Lower Leg: 3 Layer Compression System - Bilateral - Please do not place coban directly on legs or roll down tops of wraps. Elevate legs to the level of the heart and pump ankles as often as possible Wound #5 Left,Medial Malleolus: 3 Layer Compression System - Bilateral - Please do not place coban directly on legs or roll down tops of wraps. Elevate legs to the level of the heart and pump ankles as often as possible Additional Orders / Instructions: Wound #10 Right,Medial Malleolus: Vitamin A; Vitamin C, Zinc Activity as tolerated Wound #13 Left,Anterior Lower Leg: Vitamin A; Vitamin C, Zinc Activity as tolerated Wound #5 Left,Medial Malleolus: Vitamin A; Vitamin C, Zinc Activity as tolerated [name], [name]. ([id number]) Home Health: Wound #10 Right,Medial Malleolus: Continue Home Health Visits Home Health Nurse may visit PRN to address patient s wound care needs. FACE TO FACE ENCOUNTER: MEDICARE and MEDICAID PATIENTS: I certify that this patient is under my care and that I had a face-to-face encounter that  meets the physician face-to-face encounter requirements with this patient on this date. The encounter with the patient was in whole or in part for the following MEDICAL CONDITION: (primary reason for Home Healthcare) MEDICAL NECESSITY: I certify, that based on my findings, NURSING services are a medically necessary home health service. HOME BOUND STATUS: I certify that my clinical findings support that this patient is homebound (i.e., Due to illness or injury, pt requires aid of supportive devices such as crutches, cane, wheelchairs, walkers, the use of special transportation or the assistance of another person to leave their place of residence. There is a normal inability to leave the home and doing so requires considerable and taxing effort. Other absences are for medical reasons / religious services and are infrequent or of short duration when for other reasons). If current dressing causes regression in wound condition, may D/C ordered dressing product/s and apply Normal Saline Moist Dressing daily until next Wound Healing Center / Other MD appointment. Notify Wound Healing Center of regression in wound condition at [phone]. Please direct any NON-WOUND related issues/requests for orders to patient's Primary Care Physician Wound #13 Left,Anterior Lower Leg: Continue Home Health Visits Home Health Nurse may visit PRN to address patient s wound care needs. FACE TO FACE ENCOUNTER: MEDICARE and MEDICAID PATIENTS: I certify that this patient is under my care and that I had a face-to-face encounter that meets the physician face-to-face encounter requirements with this patient on this date. The encounter with the patient was in whole or in part for the following MEDICAL CONDITION: (primary reason for Home Healthcare) MEDICAL NECESSITY: I certify, that based on my findings, NURSING services are a medically necessary home health service. HOME BOUND STATUS: I certify that my clinical findings support  that this patient is homebound (i.e., Due to illness or injury, pt requires aid of supportive devices such as crutches, cane, wheelchairs, walkers, the use of special transportation or the assistance of another person to leave  their place of residence. There is a normal inability to leave the home and doing so requires considerable and taxing effort. Other absences are for medical reasons / religious services and are infrequent or of short duration when for other reasons). If current dressing causes regression in wound condition, may D/C ordered dressing product/s and apply Normal Saline Moist Dressing daily until next Wound Healing Center / Other MD appointment. Notify Wound Healing Center of regression in wound condition at [phone]. Please direct any NON-WOUND related issues/requests for orders to patient's Primary Care Physician Wound #5 Left,Medial Malleolus: Continue Home Health Visits Home Health Nurse may visit PRN to address patient s wound care needs. FACE TO FACE ENCOUNTER: MEDICARE and MEDICAID PATIENTS: I certify that this patient is under my care and that I had a face-to-face encounter that meets the physician face-to-face encounter requirements with this patient on this date. The encounter with the patient was in whole or in part for the following MEDICAL CONDITION: (primary reason for Home Healthcare) MEDICAL NECESSITY: I certify, that based on my findings, NURSING services are a medically necessary home health service. HOME BOUND STATUS: I certify that my clinical findings support that this patient is homebound (i.e., Due to illness or injury, pt requires aid of supportive devices such as crutches, cane, wheelchairs, walkers, the use of special transportation or the assistance of another person to leave their place of residence. There is a normal inability to leave the home and doing so requires considerable and taxing effort. Other absences are for medical reasons / religious  services and are infrequent or of short duration when for other reasons). If current dressing causes regression in wound condition, may D/C ordered dressing product/s and apply Normal Saline Moist Dressing daily until next Wound Healing Center / Other MD appointment. Notify Wound Healing Center of regression in wound condition at [phone]. Please direct any NON-WOUND related issues/requests for orders to patient's Primary Care Physician #1continue with silver alginate to both wound areas which is largely on the medial malleoli are/ankle areas bilaterally. ABDs. Continue 3 layer compression #2 they have juxta light stockings under the eventuality that these wounds actually close over [name], [name] ([id number]) Electronic Signature(s) Signed: 12/23/2017 5:09:03 PM By: Baltazar Najjar MD Entered By: Baltazar Najjar on 12/23/2017 16:59:27 [name] ([id number]) -------------------------------------------------------------------------------- SuperBill Details Patient Name: Donald Musca C. Date of Service: 12/23/2017 Medical Record Number: [id number] Patient Account Number: [id number] Date of Birth/Sex: 1929/02/15 (84 y.o. M) Treating RN: Huel Coventry Primary Care Provider: Guerry Potts Other Clinician: Referring Provider: Guerry Potts Treating Provider/Extender: Altamese Metz in Treatment: 6 Diagnosis Coding ICD-10 Codes Code Description [id number] Non-pressure chronic ulcer of right calf limited to breakdown of skin L97.221 Non-pressure chronic ulcer of left calf limited to breakdown of skin I87.333 Chronic venous hypertension (idiopathic) with ulcer and inflammation of bilateral lower extremity Facility Procedures CPT4 Code: [id number] Description: 11042 - DEB SUBQ TISSUE 20 SQ CM/< ICD-10 Diagnosis Description L97.221 Non-pressure chronic ulcer of left calf limited to breakdown Modifier: of skin Quantity: 1 Physician Procedures CPT4 Code:  [id number] Description: 11042 - WC PHYS SUBQ TISS 20 SQ CM ICD-10 Diagnosis Description L97.221 Non-pressure chronic ulcer of left calf limited to breakdown Modifier: of skin Quantity: 1 Electronic Signature(s) Signed: 12/23/2017 5:09:03 PM By: Baltazar Najjar MD Entered By: Baltazar Najjar on 12/23/2017 17:00:30

## 2017-12-28 NOTE — Progress Notes (Signed)
ADOLF, ORMISTON ([id number]) Visit Report for 12/23/2017 Arrival Information Details Patient Name: JAXXON, NAEEM. Date of Service: 12/23/2017 2:30 PM Medical Record Number: [id number] Patient Account Number: [id number] Date of Birth/Sex: Sep 29, 1928 (84 y.o. M) Treating RN: Renne Crigler Primary Care Karalyne Nusser: Guerry Bruin Other Clinician: Referring Dariella Gillihan: Guerry Bruin Treating Aliannah Holstrom/Extender: Altamese [place] in Treatment: 6 Visit Information History Since Last Visit All ordered tests and consults were completed: No Patient Arrived: Wheel Chair Added or deleted any medications: No Arrival Time: 15:01 Any new allergies or adverse reactions: No Accompanied By: wife Had a fall or experienced change in No Transfer Assistance: None activities of daily living that may affect Patient Identification Verified: Yes risk of falls: Secondary Verification Process Yes Signs or symptoms of abuse/neglect since last visito No Completed: Hospitalized since last visit: No Patient Requires Transmission-Based No Implantable device outside of the clinic excluding No Precautions: cellular tissue based products placed in the center Patient Has Alerts: Yes since last visit: Patient Alerts: Patient on Blood Pain Present Now: No Thinner Warfarin Pacemaker Electronic Signature(s) Signed: 12/23/2017 4:34:44 PM By: Renne Crigler Entered By: Renne Crigler on 12/23/2017 15:02:13 [name] ([id number]) -------------------------------------------------------------------------------- Encounter Discharge Information Details Patient Name: [name] Date of Service: 12/23/2017 2:30 PM Medical Record Number: [id number] Patient Account Number: [id number] Date of Birth/Sex: [birth year] (84 y.o. M) Treating RN: Huel Coventry Primary Care Baylon Santelli: Guerry Bruin Other Clinician: Referring Emika Tiano: Guerry Bruin Treating Yasin Ducat/Extender: Altamese [place] in Treatment: 6 Encounter Discharge Information Items Discharge Pain Level: 0 Discharge Condition: Stable Ambulatory Status: Wheelchair Discharge Destination: Home Transportation: Private Auto Accompanied By: spouse Schedule Follow-up Appointment: Yes Medication Reconciliation completed and No provided to Patient/Care Malikah Lakey: Provided on Clinical Summary of Care: 12/23/2017 Form Type Recipient Paper Patient RW Electronic Signature(s) Signed: 12/23/2017 4:35:20 PM By: Curtis Sites Entered By: Curtis Sites on 12/23/2017 16:35:20 [name] ([id number]) -------------------------------------------------------------------------------- Lower Extremity Assessment Details Patient Name: [name] Date of Service: 12/23/2017 2:30 PM Medical Record Number: [id number] Patient Account Number: [id number] Date of Birth/Sex: 04-29-1929 (84 y.o. M) Treating RN: Renne Crigler Primary Care Jasmaine Rochel: Guerry Bruin Other Clinician: Referring Emrie Gayle: Guerry Bruin Treating Mildred Tuccillo/Extender: Altamese [place] in Treatment: 6 Edema Assessment Assessed: [Left: No] [Right: No] [Left: Edema] [Right: :] Calf Left: Right: Point of Measurement: 33 cm From Medial Instep 29.5 cm 30.8 cm Ankle Left: Right: Point of Measurement: 12 cm From Medial Instep 23 cm 22.5 cm Vascular Assessment Claudication: Claudication Assessment [Left:None] [Right:None] Pulses: Dorsalis Pedis Palpable: [Left:Yes] [Right:Yes] Posterior Tibial Extremity colors, hair growth, and conditions: Extremity Color: [Left:Hyperpigmented] [Right:Hyperpigmented] Hair Growth on Extremity: [Left:No] [Right:No] Temperature of Extremity: [Left:Warm] [Right:Warm] Capillary Refill: [Left:< 3 seconds] [Right:< 3 seconds] Toe Nail Assessment Left: Right: Thick: Yes Yes Discolored: Yes Yes Deformed: Yes Yes Improper Length and Hygiene: Yes Yes Electronic Signature(s) Signed: 12/23/2017  4:34:44 PM By: Renne Crigler Entered By: Renne Crigler on 12/23/2017 15:19:23 Krizek, [name] ([id number]) -------------------------------------------------------------------------------- Multi Wound Chart Details Patient Name: [name] Date of Service: 12/23/2017 2:30 PM Medical Record Number: [id number] Patient Account Number: [id number] Date of Birth/Sex: Oct 14, 1928 (84 y.o. M) Treating RN: Huel Coventry Primary Care Toriann Spadoni: Guerry Bruin Other Clinician: Referring Shamanda Len: Guerry Bruin Treating Pocahontas Cohenour/Extender: Altamese [place] in Treatment: 6 Vital Signs Height(in): 68 Pulse(bpm): 82 Weight(lbs): 150 Blood Pressure(mmHg): 109/78 Body Mass Index(BMI): 23 Temperature(F): 98.2 Respiratory Rate 16 (breaths/min): Photos: Wound Location: Right Malleolus - Medial Left, Anterior Lower Leg Left Malleolus - Medial Wounding  Event: Gradually Appeared Gradually Appeared Blister Primary Etiology: Venous Leg Ulcer Venous Leg Ulcer Venous Leg Ulcer Comorbid History: Cataracts, Arrhythmia, Deep N/A Cataracts, Arrhythmia, Deep Vein Thrombosis, Vein Thrombosis, Osteoarthritis Osteoarthritis Date Acquired: 12/02/2017 12/09/2017 10/28/2017 Weeks of Treatment: 3 2 6  Wound Status: Open Open Open Measurements L x W x D 0.3x0.3x0.1 0.1x0.1x0.1 0.8x1x0.1 (cm) Area (cm) : 0.071 0.008 0.628 Volume (cm) : 0.007 0.001 0.063 % Reduction in Area: 96.10% 93.20% -166.10% % Reduction in Volume: 96.10% 91.70% -162.50% Classification: Full Thickness Without Full Thickness Without Full Thickness Without Exposed Support Structures Exposed Support Structures Exposed Support Structures Exudate Amount: Large N/A Large Exudate Type: Serous N/A Serosanguineous Exudate Color: amber N/A red, brown Wound Margin: Distinct, outline attached N/A Flat and Intact Granulation Amount: Large (67-100%) N/A Medium (34-66%) Granulation Quality: Red N/A Red Necrotic Amount: Small (1-33%)  N/A Medium (34-66%) Necrotic Tissue: Adherent Slough N/A Eschar, Adherent Slough Exposed Structures: Fascia: No N/A Fat Layer (Subcutaneous Fat Layer (Subcutaneous Tissue) Exposed: Yes Tissue) Exposed: No Fascia: No Tendon: No Tendon: No Olvera, Aarsh C. ([id number]) Muscle: No Muscle: No Joint: No Joint: No Bone: No Bone: No Epithelialization: None N/A None Debridement: Debridement - Excisional N/A N/A Pre-procedure 15:30 N/A N/A Verification/Time Out Taken: Pain Control: Other N/A N/A Tissue Debrided: Subcutaneous, Slough N/A N/A Level: Skin/Subcutaneous Tissue N/A N/A Debridement Area (sq cm): 0.09 N/A N/A Instrument: Curette N/A N/A Bleeding: Minimum N/A N/A Hemostasis Achieved: Pressure N/A N/A Procedural Pain: 0 N/A N/A Post Procedural Pain: 0 N/A N/A Debridement Treatment Procedure was tolerated well N/A N/A Response: Post Debridement 0.3x0.5x0.2 N/A N/A Measurements L x W x D (cm) Post Debridement Volume: 0.024 N/A N/A (cm) Periwound Skin Texture: Excoriation: No No Abnormalities Noted Excoriation: No Induration: No Induration: No Callus: No Callus: No Crepitus: No Crepitus: No Rash: No Rash: No Scarring: No Scarring: No Periwound Skin Moisture: Maceration: No No Abnormalities Noted Maceration: No Dry/Scaly: No Dry/Scaly: No Periwound Skin Color: Atrophie Blanche: No No Abnormalities Noted Erythema: Yes Cyanosis: No Atrophie Blanche: No Ecchymosis: No Cyanosis: No Erythema: No Ecchymosis: No Hemosiderin Staining: No Hemosiderin Staining: No Mottled: No Mottled: No Pallor: No Pallor: No Rubor: No Rubor: No Erythema Location: N/A N/A Circumferential Temperature: No Abnormality N/A No Abnormality Tenderness on Palpation: Yes No Yes Wound Preparation: Ulcer Cleansing: N/A Ulcer Cleansing: Rinsed/Irrigated with Saline Rinsed/Irrigated with Saline, Other: soap and water Topical Anesthetic Applied: Other: lidocaine 4% Topical  Anesthetic Applied: Other: lidocaine 4% Procedures Performed: Debridement N/A N/A Wound Number: 6 N/A N/A Photos: N/A N/A YAGO, LUDVIGSEN ([id number]) Wound Location: Right, Medial Lower Leg N/A N/A Wounding Event: Blister N/A N/A Primary Etiology: Venous Leg Ulcer N/A N/A Comorbid History: N/A N/A N/A Date Acquired: 10/28/2017 N/A N/A Weeks of Treatment: 6 N/A N/A Wound Status: Healed - Epithelialized N/A N/A Measurements L x W x D 0x0x0 N/A N/A (cm) Area (cm) : 0 N/A N/A Volume (cm) : 0 N/A N/A % Reduction in Area: 100.00% N/A N/A % Reduction in Volume: 100.00% N/A N/A Classification: Full Thickness Without N/A N/A Exposed Support Structures Exudate Amount: N/A N/A N/A Exudate Type: N/A N/A N/A Exudate Color: N/A N/A N/A Wound Margin: N/A N/A N/A Granulation Amount: N/A N/A N/A Granulation Quality: N/A N/A N/A Necrotic Amount: N/A N/A N/A Necrotic Tissue: N/A N/A N/A Exposed Structures: N/A N/A N/A Epithelialization: N/A N/A N/A Debridement: N/A N/A N/A Pain Control: N/A N/A N/A Tissue Debrided: N/A N/A N/A Level: N/A N/A N/A Debridement Area (sq cm): N/A N/A N/A Instrument:  N/A N/A N/A Bleeding: N/A N/A N/A Hemostasis Achieved: N/A N/A N/A Procedural Pain: N/A N/A N/A Post Procedural Pain: N/A N/A N/A Debridement Treatment N/A N/A N/A Response: Post Debridement N/A N/A N/A Measurements L x W x D (cm) Post Debridement Volume: N/A N/A N/A (cm) Periwound Skin Texture: No Abnormalities Noted N/A N/A Periwound Skin Moisture: No Abnormalities Noted N/A N/A Periwound Skin Color: No Abnormalities Noted N/A N/A Erythema Location: N/A N/A N/A Temperature: N/A N/A N/A Tenderness on Palpation: No N/A N/A Wound Preparation: N/A N/A N/A Procedures Performed: N/A N/A N/A Treatment Notes Wound #10 (Right, Medial Malleolus) 1. Cleansed with: Clean wound with Normal Saline Boger, Khalin C. ([id number]) Cleanse wound with antibacterial soap and water 2.  Anesthetic Topical Lidocaine 4% cream to wound bed prior to debridement 4. Dressing Applied: Other dressing (specify in notes) 5. Secondary Dressing Applied ABD Pad 7. Secured with 3 Layer Compression System - Bilateral Notes silvercell, abd Wound #13 (Left, Anterior Lower Leg) 1. Cleansed with: Clean wound with Normal Saline Cleanse wound with antibacterial soap and water 2. Anesthetic Topical Lidocaine 4% cream to wound bed prior to debridement 4. Dressing Applied: Other dressing (specify in notes) 5. Secondary Dressing Applied ABD Pad 7. Secured with 3 Layer Compression System - Bilateral Notes silvercell, abd Wound #5 (Left, Medial Malleolus) 1. Cleansed with: Clean wound with Normal Saline Cleanse wound with antibacterial soap and water 2. Anesthetic Topical Lidocaine 4% cream to wound bed prior to debridement 4. Dressing Applied: Other dressing (specify in notes) 5. Secondary Dressing Applied ABD Pad 7. Secured with 3 Layer Compression System - Bilateral Notes silvercell, abd Electronic Signature(s) Signed: 12/23/2017 5:09:03 PM By: Baltazar Najjar MD Entered By: Baltazar Najjar on 12/23/2017 16:55:51 [name] ([id number]) -------------------------------------------------------------------------------- Multi-Disciplinary Care Plan Details Patient Name: [name]. Date of Service: 12/23/2017 2:30 PM Medical Record Number: [id number] Patient Account Number: [id number] Date of Birth/Sex: 19-May-1929 (84 y.o. M) Treating RN: Huel Coventry Primary Care Rivka Baune: Guerry Bruin Other Clinician: Referring Hendrix Console: Guerry Bruin Treating Tevita Gomer/Extender: Altamese [place] in Treatment: 6 Active Inactive ` Abuse / Safety / Falls / Self Care Management Nursing Diagnoses: Impaired physical mobility Potential for falls Goals: Patient will not develop complications from immobility Date Initiated: 11/11/2017 Target Resolution Date:  12/09/2017 Goal Status: Active Patient will remain injury free related to falls Date Initiated: 11/11/2017 Target Resolution Date: 12/09/2017 Goal Status: Active Interventions: Assess fall risk on admission and as needed Notes: ` Nutrition Nursing Diagnoses: Potential for alteratiion in Nutrition/Potential for imbalanced nutrition Goals: Patient/caregiver agrees to and verbalizes understanding of need to use nutritional supplements and/or vitamins as prescribed Date Initiated: 11/11/2017 Target Resolution Date: 12/10/2017 Goal Status: Active Interventions: Assess patient nutrition upon admission and as needed per policy Notes: ` Orientation to the Wound Care Program Nursing Diagnoses: Knowledge deficit related to the wound healing center program Goals: Patient/caregiver will verbalize understanding of the [facility] [address] SIEGFRIED, VIETH ([id number]) Date Initiated: 11/11/2017 Target Resolution Date: 12/10/2017 Goal Status: Active Interventions: Provide education on orientation to the wound center Notes: ` Venous Leg Ulcer Nursing Diagnoses: Actual venous Insuffiency (use after diagnosis is confirmed) Goals: Non-invasive venous studies are completed as ordered Date Initiated: 11/11/2017 Target Resolution Date: 12/10/2017 Goal Status: Active Interventions: Assess peripheral edema status every visit. Treatment Activities: Non-invasive vascular studies : 11/11/2017 Notes: ` Wound/Skin Impairment Nursing Diagnoses: Impaired tissue integrity Goals: Ulcer/skin breakdown will have a volume reduction of 80% by week 12 Date Initiated: 11/11/2017 Target Resolution Date:  12/10/2017 Goal Status: Active Interventions: Assess patient/caregiver ability to obtain necessary supplies Provide education on ulcer and skin care Treatment Activities: Topical wound management initiated : 11/11/2017 Notes: Electronic Signature(s) Signed: 12/23/2017 5:11:52 PM By: Elliot Gurney, [name], RN, CWS, Kim  RN, [name] Entered By: Elliot Gurney, [name], RN, CWS, Kim on 12/23/2017 15:33:48 [name] ([id number]) -------------------------------------------------------------------------------- Pain Assessment Details Patient Name: [name] Date of Service: 12/23/2017 2:30 PM Medical Record Number: [id number] Patient Account Number: [id number] Date of Birth/Sex: Feb 11, 1929 (84 y.o. M) Treating RN: Renne Crigler Primary Care Breccan Galant: Guerry Bruin Other Clinician: Referring Keevan Wolz: Guerry Bruin Treating Saima Monterroso/Extender: Altamese [place] in Treatment: 6 Active Problems Location of Pain Severity and Description of Pain Patient Has Paino Yes Site Locations Duration of the Pain. Constant / Intermittento Intermittent Pain Management and Medication Current Pain Management: Notes states ankles hurt at night Electronic Signature(s) Signed: 12/23/2017 4:34:44 PM By: Renne Crigler Entered By: Renne Crigler on 12/23/2017 15:02:56 [name] ([id number]) -------------------------------------------------------------------------------- Patient/Caregiver Education Details Patient Name: [name] Date of Service: 12/23/2017 2:30 PM Medical Record Number: [id number] Patient Account Number: [id number] Date of Birth/Gender: 12-10-1928 (84 y.o. M) Treating RN: Curtis Sites Primary Care Physician: Guerry Bruin Other Clinician: Referring Physician: Guerry Bruin Treating Physician/Extender: Altamese [place] in Treatment: 6 Education Assessment Education Provided To: Patient Education Topics Provided Venous: Handouts: Other: leg elevation Methods: Explain/Verbal Responses: State content correctly Electronic Signature(s) Signed: 12/23/2017 4:39:21 PM By: Curtis Sites Entered By: Curtis Sites on 12/23/2017 16:35:36 Standen, [name] ([id number]) -------------------------------------------------------------------------------- Wound  Assessment Details Patient Name: [name] Date of Service: 12/23/2017 2:30 PM Medical Record Number: [id number] Patient Account Number: [id number] Date of Birth/Sex: 1928/12/20 (84 y.o. M) Treating RN: Renne Crigler Primary Care Letizia Hook: Guerry Bruin Other Clinician: Referring Camika Marsico: Guerry Bruin Treating Salah Nakamura/Extender: Altamese [place] in Treatment: 6 Wound Status Wound Number: 10 Primary Venous Leg Ulcer Etiology: Wound Location: Right Malleolus - Medial Wound Status: Open Wounding Event: Gradually Appeared Comorbid Cataracts, Arrhythmia, Deep Vein Date Acquired: 12/02/2017 History: Thrombosis, Osteoarthritis Weeks Of Treatment: 3 Clustered Wound: No Photos Photo Uploaded By: Renne Crigler on 12/23/2017 16:21:59 Wound Measurements Length: (cm) 0.3 Width: (cm) 0.3 Depth: (cm) 0.1 Area: (cm) 0.071 Volume: (cm) 0.007 % Reduction in Area: 96.1% % Reduction in Volume: 96.1% Epithelialization: None Tunneling: No Undermining: No Wound Description Full Thickness Without Exposed Support Classification: Structures Wound Margin: Distinct, outline attached Exudate Large Amount: Exudate Type: Serous Exudate Color: amber Foul Odor After Cleansing: No Slough/Fibrino Yes Wound Bed Granulation Amount: Large (67-100%) Exposed Structure Granulation Quality: Red Fascia Exposed: No Necrotic Amount: Small (1-33%) Fat Layer (Subcutaneous Tissue) Exposed: No Necrotic Quality: Adherent Slough Tendon Exposed: No Muscle Exposed: No Joint Exposed: No Bone Exposed: No Gavina, Kirke C. ([id number]) Periwound Skin Texture Texture Color No Abnormalities Noted: No No Abnormalities Noted: No Callus: No Atrophie Blanche: No Crepitus: No Cyanosis: No Excoriation: No Ecchymosis: No Induration: No Erythema: No Rash: No Hemosiderin Staining: No Scarring: No Mottled: No Pallor: No Moisture Rubor: No No Abnormalities Noted: No Dry /  Scaly: No Temperature / Pain Maceration: No Temperature: No Abnormality Tenderness on Palpation: Yes Wound Preparation Ulcer Cleansing: Rinsed/Irrigated with Saline Topical Anesthetic Applied: Other: lidocaine 4%, Treatment Notes Wound #10 (Right, Medial Malleolus) 1. Cleansed with: Clean wound with Normal Saline Cleanse wound with antibacterial soap and water 2. Anesthetic Topical Lidocaine 4% cream to wound bed prior to debridement 4. Dressing Applied: Other dressing (specify in notes) 5. Secondary Dressing Applied ABD Pad  7. Secured with 3 Layer Compression System - Bilateral Notes silvercell, abd Electronic Signature(s) Signed: 12/23/2017 4:34:44 PM By: Renne Crigler Entered By: Renne Crigler on 12/23/2017 15:14:43 Ventresca, [name] ([id number]) -------------------------------------------------------------------------------- Wound Assessment Details Patient Name: [name] Date of Service: 12/23/2017 2:30 PM Medical Record Number: [id number] Patient Account Number: [id number] Date of Birth/Sex: 12-07-1928 (84 y.o. M) Treating RN: Renne Crigler Primary Care Ramere Downs: Guerry Bruin Other Clinician: Referring Kinley Ferrentino: Guerry Bruin Treating Cornesha Radziewicz/Extender: Altamese [place] in Treatment: 6 Wound Status Wound Number: 13 Primary Etiology: Venous Leg Ulcer Wound Location: Left, Anterior Lower Leg Wound Status: Open Wounding Event: Gradually Appeared Date Acquired: 12/09/2017 Weeks Of Treatment: 2 Clustered Wound: No Photos Photo Uploaded By: Renne Crigler on 12/23/2017 16:22:00 Wound Measurements Length: (cm) 0.1 Width: (cm) 0.1 Depth: (cm) 0.1 Area: (cm) 0.008 Volume: (cm) 0.001 % Reduction in Area: 93.2% % Reduction in Volume: 91.7% Wound Description Full Thickness Without Exposed Support Classification: Structures Periwound Skin Texture Texture Color No Abnormalities Noted: No No Abnormalities Noted:  No Moisture No Abnormalities Noted: No Treatment Notes Wound #13 (Left, Anterior Lower Leg) 1. Cleansed with: Clean wound with Normal Saline Cleanse wound with antibacterial soap and water 2. Anesthetic Topical Lidocaine 4% cream to wound bed prior to debridement Acevedo, Pratik C. ([id number]) 4. Dressing Applied: Other dressing (specify in notes) 5. Secondary Dressing Applied ABD Pad 7. Secured with 3 Layer Compression System - Bilateral Notes silvercell, abd Electronic Signature(s) Signed: 12/23/2017 4:34:44 PM By: Renne Crigler Entered By: Renne Crigler on 12/23/2017 15:17:07 [name] ([id number]) -------------------------------------------------------------------------------- Wound Assessment Details Patient Name: [name] Date of Service: 12/23/2017 2:30 PM Medical Record Number: [id number] Patient Account Number: [id number] Date of Birth/Sex: 26-Aug-1929 (84 y.o. M) Treating RN: Renne Crigler Primary Care Ethen Bannan: Guerry Bruin Other Clinician: Referring Earma Nicolaou: Guerry Bruin Treating Teauna Dubach/Extender: Altamese [place] in Treatment: 6 Wound Status Wound Number: 5 Primary Venous Leg Ulcer Etiology: Wound Location: Left Malleolus - Medial Wound Status: Open Wounding Event: Blister Comorbid Cataracts, Arrhythmia, Deep Vein Date Acquired: 10/28/2017 History: Thrombosis, Osteoarthritis Weeks Of Treatment: 6 Clustered Wound: No Photos Photo Uploaded By: Renne Crigler on 12/23/2017 16:23:13 Wound Measurements Length: (cm) 0.8 Width: (cm) 1 Depth: (cm) 0.1 Area: (cm) 0.628 Volume: (cm) 0.063 % Reduction in Area: -166.1% % Reduction in Volume: -162.5% Epithelialization: None Tunneling: No Undermining: No Wound Description Full Thickness Without Exposed Support Classification: Structures Wound Margin: Flat and Intact Exudate Large Amount: Exudate Type: Serosanguineous Exudate Color: red, brown Foul Odor  After Cleansing: No Slough/Fibrino Yes Wound Bed Granulation Amount: Medium (34-66%) Exposed Structure Granulation Quality: Red Fascia Exposed: No Necrotic Amount: Medium (34-66%) Fat Layer (Subcutaneous Tissue) Exposed: Yes Necrotic Quality: Eschar, Adherent Slough Tendon Exposed: No Muscle Exposed: No Joint Exposed: No Bone Exposed: No [place], Trevione C. ([id number]) Periwound Skin Texture Texture Color No Abnormalities Noted: No No Abnormalities Noted: No Callus: No Atrophie Blanche: No Crepitus: No Cyanosis: No Excoriation: No Ecchymosis: No Induration: No Erythema: Yes Rash: No Erythema Location: Circumferential Scarring: No Hemosiderin Staining: No Mottled: No Moisture Pallor: No No Abnormalities Noted: No Rubor: No Dry / Scaly: No Maceration: No Temperature / Pain Temperature: No Abnormality Tenderness on Palpation: Yes Wound Preparation Ulcer Cleansing: Rinsed/Irrigated with Saline, Other: soap and water, Topical Anesthetic Applied: Other: lidocaine 4%, Treatment Notes Wound #5 (Left, Medial Malleolus) 1. Cleansed with: Clean wound with Normal Saline Cleanse wound with antibacterial soap and water 2. Anesthetic Topical Lidocaine 4% cream to wound bed prior to debridement 4.  Dressing Applied: Other dressing (specify in notes) 5. Secondary Dressing Applied ABD Pad 7. Secured with 3 Layer Compression System - Bilateral Notes silvercell, abd Electronic Signature(s) Signed: 12/23/2017 4:34:44 PM By: Renne CriglerFlinchum, Cheryl Entered By: Renne CriglerFlinchum, Cheryl on 12/23/2017 15:15:37 Georges MouseWILLIFORD, Juvon C. ([id number]) -------------------------------------------------------------------------------- Wound Assessment Details Patient Name: [name], Jaquon C. Date of Service: 12/23/2017 2:30 PM Medical Record Number: [id number] Patient Account Number: [id number] Date of Birth/Sex: 12/20/1928 (84 y.o. M) Treating RN: Renne CriglerFlinchum, Cheryl Primary Care Isa Kohlenberg: Guerry BruinISOVEC, RICHARD  Other Clinician: Referring Barrett Goldie: Guerry BruinISOVEC, RICHARD Treating Joannah Gitlin/Extender: Altamese CarolinaOBSON, MICHAEL G Weeks in Treatment: 6 Wound Status Wound Number: 6 Primary Etiology: Venous Leg Ulcer Wound Location: Right, Medial Lower Leg Wound Status: Healed - Epithelialized Wounding Event: Blister Date Acquired: 10/28/2017 Weeks Of Treatment: 6 Clustered Wound: No Photos Photo Uploaded By: Renne CriglerFlinchum, Cheryl on 12/23/2017 16:23:14 Wound Measurements Length: (cm) 0 Width: (cm) 0 Depth: (cm) 0 Area: (cm) 0 Volume: (cm) 0 % Reduction in Area: 100% % Reduction in Volume: 100% Wound Description Full Thickness Without Exposed Support Classification: Structures Periwound Skin Texture Texture Color No Abnormalities Noted: No No Abnormalities Noted: No Moisture No Abnormalities Noted: No Electronic Signature(s) Signed: 12/23/2017 4:34:44 PM By: Renne CriglerFlinchum, Cheryl Entered By: Renne CriglerFlinchum, Cheryl on 12/23/2017 15:17:08 Georges MouseWILLIFORD, Graylen C. ([id number]) -------------------------------------------------------------------------------- Vitals Details Patient Name: [name], Rian C. Date of Service: 12/23/2017 2:30 PM Medical Record Number: [id number] Patient Account Number: [id number] Date of Birth/Sex: 07/27/1929 (84 y.o. M) Treating RN: Curtis Sitesorthy, Joanna Primary Care Randie Bloodgood: Guerry BruinISOVEC, RICHARD Other Clinician: Referring Daris Aristizabal: Guerry BruinISOVEC, RICHARD Treating Raevyn Sokol/Extender: Altamese CarolinaOBSON, MICHAEL G Weeks in Treatment: 6 Vital Signs Time Taken: 15:43 Temperature (F): 98.2 Height (in): 68 Pulse (bpm): 82 Weight (lbs): 150 Respiratory Rate (breaths/min): 16 Body Mass Index (BMI): 22.8 Blood Pressure (mmHg): 109/78 Reference Range: 80 - 120 mg / dl Electronic Signature(s) Signed: 12/23/2017 4:39:21 PM By: Curtis Sitesorthy, Joanna Entered By: Curtis Sitesorthy, Joanna on 12/23/2017 15:44:00

## 2017-12-30 ENCOUNTER — Encounter: Payer: Medicare Other | Admitting: Internal Medicine

## 2017-12-30 DIAGNOSIS — I87333 Chronic venous hypertension (idiopathic) with ulcer and inflammation of bilateral lower extremity: Secondary | ICD-10-CM | POA: Diagnosis not present

## 2017-12-30 DIAGNOSIS — L97821 Non-pressure chronic ulcer of other part of left lower leg limited to breakdown of skin: Secondary | ICD-10-CM | POA: Diagnosis not present

## 2017-12-30 DIAGNOSIS — I872 Venous insufficiency (chronic) (peripheral): Secondary | ICD-10-CM | POA: Diagnosis not present

## 2017-12-30 DIAGNOSIS — L97221 Non-pressure chronic ulcer of left calf limited to breakdown of skin: Secondary | ICD-10-CM | POA: Diagnosis not present

## 2017-12-30 DIAGNOSIS — L97322 Non-pressure chronic ulcer of left ankle with fat layer exposed: Secondary | ICD-10-CM | POA: Diagnosis not present

## 2017-12-30 DIAGNOSIS — Z7901 Long term (current) use of anticoagulants: Secondary | ICD-10-CM | POA: Diagnosis not present

## 2017-12-30 DIAGNOSIS — L97211 Non-pressure chronic ulcer of right calf limited to breakdown of skin: Secondary | ICD-10-CM | POA: Diagnosis not present

## 2017-12-30 DIAGNOSIS — I771 Stricture of artery: Secondary | ICD-10-CM | POA: Diagnosis not present

## 2017-12-30 DIAGNOSIS — I87332 Chronic venous hypertension (idiopathic) with ulcer and inflammation of left lower extremity: Secondary | ICD-10-CM | POA: Diagnosis not present

## 2018-01-01 DIAGNOSIS — I87333 Chronic venous hypertension (idiopathic) with ulcer and inflammation of bilateral lower extremity: Secondary | ICD-10-CM | POA: Diagnosis not present

## 2018-01-01 DIAGNOSIS — L97321 Non-pressure chronic ulcer of left ankle limited to breakdown of skin: Secondary | ICD-10-CM | POA: Diagnosis not present

## 2018-01-01 DIAGNOSIS — I4891 Unspecified atrial fibrillation: Secondary | ICD-10-CM | POA: Diagnosis not present

## 2018-01-01 DIAGNOSIS — L97211 Non-pressure chronic ulcer of right calf limited to breakdown of skin: Secondary | ICD-10-CM | POA: Diagnosis not present

## 2018-01-01 DIAGNOSIS — L97221 Non-pressure chronic ulcer of left calf limited to breakdown of skin: Secondary | ICD-10-CM | POA: Diagnosis not present

## 2018-01-01 DIAGNOSIS — I10 Essential (primary) hypertension: Secondary | ICD-10-CM | POA: Diagnosis not present

## 2018-01-04 DIAGNOSIS — I4891 Unspecified atrial fibrillation: Secondary | ICD-10-CM | POA: Diagnosis not present

## 2018-01-04 DIAGNOSIS — L97221 Non-pressure chronic ulcer of left calf limited to breakdown of skin: Secondary | ICD-10-CM | POA: Diagnosis not present

## 2018-01-04 DIAGNOSIS — I10 Essential (primary) hypertension: Secondary | ICD-10-CM | POA: Diagnosis not present

## 2018-01-04 DIAGNOSIS — I87333 Chronic venous hypertension (idiopathic) with ulcer and inflammation of bilateral lower extremity: Secondary | ICD-10-CM | POA: Diagnosis not present

## 2018-01-04 DIAGNOSIS — L97321 Non-pressure chronic ulcer of left ankle limited to breakdown of skin: Secondary | ICD-10-CM | POA: Diagnosis not present

## 2018-01-04 DIAGNOSIS — L97211 Non-pressure chronic ulcer of right calf limited to breakdown of skin: Secondary | ICD-10-CM | POA: Diagnosis not present

## 2018-01-05 NOTE — Progress Notes (Signed)
INIKO, ROBLES ([id number]) Visit Report for 12/30/2017 Arrival Information Details Patient Name: [name], [name]. Date of Service: 12/30/2017 2:00 PM Medical Record Number: [id number] Patient Account Number: [id number] Date of Birth/Sex: Jan 24, 1929 (84 y.o. M) Treating RN: Phillis Haggis Primary Care Zakyria Metzinger: Guerry Bruin Other Clinician: Referring Azizah Lisle: Guerry Bruin Treating Teejay Meader/Extender: Altamese [place] in Treatment: 7 Visit Information History Since Last Visit All ordered tests and consults were completed: No Patient Arrived: Wheel Chair Added or deleted any medications: No Arrival Time: 14:13 Any new allergies or adverse reactions: No Accompanied By: spouse Had a fall or experienced change in No Transfer Assistance: EasyPivot Patient activities of daily living that may affect Lift risk of falls: Patient Identification Verified: Yes Signs or symptoms of abuse/neglect since last visito No Secondary Verification Process Yes Hospitalized since last visit: No Completed: Implantable device outside of the clinic excluding No Patient Requires Transmission-Based No cellular tissue based products placed in the center Precautions: since last visit: Patient Has Alerts: Yes Has Dressing in Place as Prescribed: Yes Patient Alerts: Patient on Blood Has Compression in Place as Prescribed: Yes Thinner Warfarin Pain Present Now: No Pacemaker Electronic Signature(s) Signed: 01/01/2018 4:29:16 PM By: Alejandro Mulling Entered By: Alejandro Mulling on 12/30/2017 14:14:05 [name] ([id number]) -------------------------------------------------------------------------------- Encounter Discharge Information Details Patient Name: Donald Musca C. Date of Service: 12/30/2017 2:00 PM Medical Record Number: [id number] Patient Account Number: [id number] Date of Birth/Sex: Apr 16, 1929 (84 y.o. M) Treating RN: Huel Coventry Primary Care Kacy Hegna: Guerry Bruin Other Clinician: Referring Maryiah Olvey: Guerry Bruin Treating Tahjay Binion/Extender: Altamese [place] in Treatment: 7 Encounter Discharge Information Items Discharge Pain Level: 0 Discharge Condition: Stable Ambulatory Status: Wheelchair Discharge Destination: Home Transportation: Private Auto Accompanied By: spouse Schedule Follow-up Appointment: Yes Medication Reconciliation completed and No provided to Patient/Care Gerrald Basu: Provided on Clinical Summary of Care: 12/30/2017 Form Type Recipient Paper Patient RW Electronic Signature(s) Signed: 12/30/2017 4:34:54 PM By: Curtis Sites Entered By: Curtis Sites on 12/30/2017 16:34:54 [name] ([id number]) -------------------------------------------------------------------------------- Lower Extremity Assessment Details Patient Name: Donald Musca C. Date of Service: 12/30/2017 2:00 PM Medical Record Number: [id number] Patient Account Number: [id number] Date of Birth/Sex: [birth year] (84 y.o. M) Treating RN: Phillis Haggis Primary Care Logan Vegh: Guerry Bruin Other Clinician: Referring Thiago Ragsdale: Guerry Bruin Treating Aneisa Karren/Extender: Altamese [place] in Treatment: 7 Edema Assessment Assessed: [Left: No] [Right: No] [Left: Edema] [Right: :] Calf Left: Right: Point of Measurement: 33 cm From Medial Instep 29.5 cm 30.2 cm Ankle Left: Right: Point of Measurement: 12 cm From Medial Instep 22 cm 22.7 cm Vascular Assessment Pulses: Dorsalis Pedis Palpable: [Left:Yes] [Right:Yes] Posterior Tibial Extremity colors, hair growth, and conditions: Extremity Color: [Left:Hyperpigmented] [Right:Hyperpigmented] Temperature of Extremity: [Left:Warm] [Right:Warm] Capillary Refill: [Left:< 3 seconds] [Right:< 3 seconds] Toe Nail Assessment Left: Right: Thick: Yes Yes Discolored: Yes Yes Deformed: Yes Yes Improper Length and Hygiene: Yes Yes Electronic Signature(s) Signed: 01/01/2018 4:29:16 PM  By: Alejandro Mulling Entered By: Alejandro Mulling on 12/30/2017 14:27:02 [name] ([id number]) -------------------------------------------------------------------------------- Multi Wound Chart Details Patient Name: Donald Musca C. Date of Service: 12/30/2017 2:00 PM Medical Record Number: [id number] Patient Account Number: [id number] Date of Birth/Sex: 03-30-1929 (84 y.o. M) Treating RN: Phillis Haggis Primary Care Goldye Tourangeau: Guerry Bruin Other Clinician: Referring Bevely Hackbart: Guerry Bruin Treating Margy Sumler/Extender: Altamese [place] in Treatment: 7 Vital Signs Height(in): 68 Pulse(bpm): 66 Weight(lbs): 150 Blood Pressure(mmHg): 113/99 Body Mass Index(BMI): 23 Temperature(F): 97.6 Respiratory Rate 16 (breaths/min): Photos: [10:No Photos] [13:No Photos] [5:No Photos] Wound Location: [10:Right, Medial  Malleolus] [13:Left, Anterior Lower Leg] [5:Left, Medial Malleolus] Wounding Event: [10:Gradually Appeared] [13:Gradually Appeared] [5:Blister] Primary Etiology: [10:Venous Leg Ulcer] [13:Venous Leg Ulcer] [5:Venous Leg Ulcer] Date Acquired: [10:12/02/2017] [13:12/09/2017] [5:10/28/2017] Weeks of Treatment: [10:4] [13:3] [5:7] Wound Status: [10:Open] [13:Open] [5:Open] Measurements L x W x D [10:0.3x0.3x0.1] [13:0.7x0.4x0.1] [5:0.8x0.7x0.2] (cm) Area (cm) : [10:0.071] [13:0.22] [5:0.44] Volume (cm) : [10:0.007] [13:0.022] [5:0.088] % Reduction in Area: [10:96.10%] [13:-86.40%] [5:-86.40%] % Reduction in Volume: [10:96.10%] [13:-83.30%] [5:-266.70%] Classification: [10:Full Thickness Without Exposed Support Structures] [13:Full Thickness Without Exposed Support Structures] [5:Full Thickness Without Exposed Support Structures] Debridement: [10:N/A] [13:Debridement - Selective/Open Wound] [5:Debridement - Excisional] Pre-procedure [10:N/A] [13:14:35] [5:14:35] Verification/Time Out Taken: Pain Control: [10:N/A] [13:Other] [5:Other] Tissue Debrided:  [10:N/A] [13:Necrotic/Eschar] [5:Subcutaneous, Slough] Level: [10:N/A] [13:Non-Viable Tissue] [5:Skin/Subcutaneous Tissue] Debridement Area (sq cm): [10:N/A] [13:0.28] [5:0.56] Instrument: [10:N/A] [13:Curette] [5:Curette] Bleeding: [10:N/A] [13:None] [5:None] Procedural Pain: [10:N/A] [13:0] [5:3] Post Procedural Pain: [10:N/A] [13:0] [5:3] Debridement Treatment [10:N/A] [13:Procedure was tolerated well] [5:Procedure was tolerated well] Response: Post Debridement [10:N/A] [13:0.2x0.2x0.1] [5:0.8x0.7x0.3] Measurements L x W x D (cm) Post Debridement Volume: [10:N/A] [13:0.003] [5:0.132] (cm) Periwound Skin Texture: [10:No Abnormalities Noted] [13:No Abnormalities Noted] [5:No Abnormalities Noted] Periwound Skin Moisture: [10:No Abnormalities Noted] [13:No Abnormalities Noted] [5:No Abnormalities Noted] Periwound Skin Color: [10:No Abnormalities Noted] [13:No Abnormalities Noted] [5:No Abnormalities Noted] Tenderness on Palpation: No No No Procedures Performed: N/A Debridement Debridement Treatment Notes Electronic Signature(s) Signed: 12/30/2017 5:40:08 PM By: Baltazar Najjar MD Entered By: Baltazar Najjar on 12/30/2017 15:20:08 [name] ([id number]) -------------------------------------------------------------------------------- Multi-Disciplinary Care Plan Details Patient Name: Donald Musca C. Date of Service: 12/30/2017 2:00 PM Medical Record Number: [id number] Patient Account Number: [id number] Date of Birth/Sex: October 31, 1928 (84 y.o. M) Treating RN: Phillis Haggis Primary Care Hiilani Jetter: Guerry Bruin Other Clinician: Referring Barabara Motz: Guerry Bruin Treating Kelisha Dall/Extender: Altamese [place] in Treatment: 7 Active Inactive ` Abuse / Safety / Falls / Self Care Management Nursing Diagnoses: Impaired physical mobility Potential for falls Goals: Patient will not develop complications from immobility Date Initiated: 11/11/2017 Target Resolution  Date: 12/09/2017 Goal Status: Active Patient will remain injury free related to falls Date Initiated: 11/11/2017 Target Resolution Date: 12/09/2017 Goal Status: Active Interventions: Assess fall risk on admission and as needed Notes: ` Nutrition Nursing Diagnoses: Potential for alteratiion in Nutrition/Potential for imbalanced nutrition Goals: Patient/caregiver agrees to and verbalizes understanding of need to use nutritional supplements and/or vitamins as prescribed Date Initiated: 11/11/2017 Target Resolution Date: 12/10/2017 Goal Status: Active Interventions: Assess patient nutrition upon admission and as needed per policy Notes: ` Orientation to the Wound Care Program Nursing Diagnoses: Knowledge deficit related to the wound healing center program Goals: Patient/caregiver will verbalize understanding of the [facility] Center [address] JOANTHONY, HAMZA ([id number]) Date Initiated: 11/11/2017 Target Resolution Date: 12/10/2017 Goal Status: Active Interventions: Provide education on orientation to the wound center Notes: ` Venous Leg Ulcer Nursing Diagnoses: Actual venous Insuffiency (use after diagnosis is confirmed) Goals: Non-invasive venous studies are completed as ordered Date Initiated: 11/11/2017 Target Resolution Date: 12/10/2017 Goal Status: Active Interventions: Assess peripheral edema status every visit. Treatment Activities: Non-invasive vascular studies : 11/11/2017 Notes: ` Wound/Skin Impairment Nursing Diagnoses: Impaired tissue integrity Goals: Ulcer/skin breakdown will have a volume reduction of 80% by week 12 Date Initiated: 11/11/2017 Target Resolution Date: 12/10/2017 Goal Status: Active Interventions: Assess patient/caregiver ability to obtain necessary supplies Provide education on ulcer and skin care Treatment Activities: Topical wound management initiated : 11/11/2017 Notes: Electronic Signature(s) Signed: 01/01/2018 4:29:16 PM By: Alejandro Mulling Entered By: Alejandro Mulling  on 12/30/2017 14:35:56 JAYJAY, LITTLES ([id number]) -------------------------------------------------------------------------------- Pain Assessment Details Patient Name: TAVARI, LOADHOLT. Date of Service: 12/30/2017 2:00 PM Medical Record Number: [id number] Patient Account Number: [id number] Date of Birth/Sex: Mar 26, 1929 (84 y.o. M) Treating RN: Phillis Haggis Primary Care Seddrick Flax: Guerry Bruin Other Clinician: Referring Versie Soave: Guerry Bruin Treating Hailie Searight/Extender: Altamese [place] in Treatment: 7 Active Problems Location of Pain Severity and Description of Pain Patient Has Paino No Site Locations Pain Management and Medication Current Pain Management: Electronic Signature(s) Signed: 01/01/2018 4:29:16 PM By: Alejandro Mulling Entered By: Alejandro Mulling on 12/30/2017 14:14:18 [name] ([id number]) -------------------------------------------------------------------------------- Patient/Caregiver Education Details Patient Name: [name]. Date of Service: 12/30/2017 2:00 PM Medical Record Number: [id number] Patient Account Number: [id number] Date of Birth/Gender: 02/16/1929 (84 y.o. M) Treating RN: Curtis Sites Primary Care Physician: Guerry Bruin Other Clinician: Referring Physician: Guerry Bruin Treating Physician/Extender: Altamese [place] in Treatment: 7 Education Assessment Education Provided To: Patient Education Topics Provided Venous: Handouts: Other: leg elevation Methods: Explain/Verbal Responses: State content correctly Electronic Signature(s) Signed: 12/30/2017 5:00:43 PM By: Curtis Sites Entered By: Curtis Sites on 12/30/2017 16:35:15 [name], [name] ([id number]) -------------------------------------------------------------------------------- Wound Assessment Details Patient Name: Donald Musca C. Date of Service: 12/30/2017 2:00 PM Medical Record  Number: [id number] Patient Account Number: [id number] Date of Birth/Sex: 1929/05/15 (84 y.o. M) Treating RN: Phillis Haggis Primary Care Khyan Oats: Guerry Bruin Other Clinician: Referring Tahnee Cifuentes: Guerry Bruin Treating Elaura Calix/Extender: Altamese [place] in Treatment: 7 Wound Status Wound Number: 10 Primary Etiology: Venous Leg Ulcer Wound Location: Right, Medial Malleolus Wound Status: Open Wounding Event: Gradually Appeared Date Acquired: 12/02/2017 Weeks Of Treatment: 4 Clustered Wound: No Photos Photo Uploaded By: [name] on 12/30/2017 16:59:03 Wound Measurements Length: (cm) 0.3 Width: (cm) 0.3 Depth: (cm) 0.1 Area: (cm) 0.071 Volume: (cm) 0.007 % Reduction in Area: 96.1% % Reduction in Volume: 96.1% Wound Description Full Thickness Without Exposed Support Classification: Structures Periwound Skin Texture Texture Color No Abnormalities Noted: No No Abnormalities Noted: No Moisture No Abnormalities Noted: No Treatment Notes Wound #10 (Right, Medial Malleolus) 1. Cleansed with: Clean wound with Normal Saline Cleanse wound with antibacterial soap and water 2. Anesthetic Topical Lidocaine 4% cream to wound bed prior to debridement [name], [name] ([id number]) 4. Dressing Applied: Other dressing (specify in notes) 5. Secondary Dressing Applied ABD Pad 7. Secured with 3 Layer Compression System - Bilateral Notes silvercell, abd Electronic Signature(s) Signed: 01/01/2018 4:29:16 PM By: Alejandro Mulling Entered By: Alejandro Mulling on 12/30/2017 14:23:40 [name] ([id number]) -------------------------------------------------------------------------------- Wound Assessment Details Patient Name: Donald Musca C. Date of Service: 12/30/2017 2:00 PM Medical Record Number: [id number] Patient Account Number: [id number] Date of Birth/Sex: 1929/01/20 (84 y.o. M) Treating RN: Phillis Haggis Primary Care Ryka Beighley: Guerry Bruin Other Clinician: Referring Marvella Jenning: Guerry Bruin Treating Godfrey Tritschler/Extender: Altamese [place] in Treatment: 7 Wound Status Wound Number: 13 Primary Etiology: Venous Leg Ulcer Wound Location: Left, Anterior Lower Leg Wound Status: Open Wounding Event: Gradually Appeared Date Acquired: 12/09/2017 Weeks Of Treatment: 3 Clustered Wound: No Photos Photo Uploaded By: [name] on 12/30/2017 16:59:47 Wound Measurements Length: (cm) 0.7 Width: (cm) 0.4 Depth: (cm) 0.1 Area: (cm) 0.22 Volume: (cm) 0.022 % Reduction in Area: -86.4% % Reduction in Volume: -83.3% Wound Description Full Thickness Without Exposed Support Classification: Structures Periwound Skin Texture Texture Color No Abnormalities Noted: No No Abnormalities Noted: No Moisture No Abnormalities Noted: No Treatment Notes Wound #13 (Left, Anterior Lower Leg) 1. Cleansed with: Clean wound with Normal Saline Cleanse  wound with antibacterial soap and water 2. Anesthetic Topical Lidocaine 4% cream to wound bed prior to debridement [name], [name] ([id number]) 4. Dressing Applied: Other dressing (specify in notes) 5. Secondary Dressing Applied ABD Pad 7. Secured with 3 Layer Compression System - Bilateral Notes silvercell, abd Electronic Signature(s) Signed: 01/01/2018 4:29:16 PM By: Alejandro Mulling Entered By: Alejandro Mulling on 12/30/2017 14:24:54 [name] ([id number]) -------------------------------------------------------------------------------- Wound Assessment Details Patient Name: Donald Musca C. Date of Service: 12/30/2017 2:00 PM Medical Record Number: [id number] Patient Account Number: [id number] Date of Birth/Sex: Dec 21, 1928 (84 y.o. M) Treating RN: Phillis Haggis Primary Care Miran Kautzman: Guerry Bruin Other Clinician: Referring Ethel Meisenheimer: Guerry Bruin Treating Cheyan Frees/Extender: Altamese [place] in Treatment: 7 Wound Status Wound Number:  5 Primary Etiology: Venous Leg Ulcer Wound Location: Left, Medial Malleolus Wound Status: Open Wounding Event: Blister Date Acquired: 10/28/2017 Weeks Of Treatment: 7 Clustered Wound: No Photos Photo Uploaded By: [name] on 12/30/2017 17:00:17 Wound Measurements Length: (cm) 0.8 Width: (cm) 0.7 Depth: (cm) 0.2 Area: (cm) 0.44 Volume: (cm) 0.088 % Reduction in Area: -86.4% % Reduction in Volume: -266.7% Wound Description Full Thickness Without Exposed Support Classification: Structures Periwound Skin Texture Texture Color No Abnormalities Noted: No No Abnormalities Noted: No Moisture No Abnormalities Noted: No Treatment Notes Wound #5 (Left, Medial Malleolus) 1. Cleansed with: Clean wound with Normal Saline Cleanse wound with antibacterial soap and water 2. Anesthetic Topical Lidocaine 4% cream to wound bed prior to debridement [name], [name] ([id number]) 4. Dressing Applied: Other dressing (specify in notes) 5. Secondary Dressing Applied ABD Pad 7. Secured with 3 Layer Compression System - Bilateral Notes silvercell, abd Electronic Signature(s) Signed: 01/01/2018 4:29:16 PM By: Alejandro Mulling Entered By: Alejandro Mulling on 12/30/2017 14:24:55 [name] ([id number]) -------------------------------------------------------------------------------- Vitals Details Patient Name: Donald Musca C. Date of Service: 12/30/2017 2:00 PM Medical Record Number: [id number] Patient Account Number: [id number] Date of Birth/Sex: [birth year] (84 y.o. M) Treating RN: Phillis Haggis Primary Care Lyndall Windt: Guerry Bruin Other Clinician: Referring Kobee Medlen: Guerry Bruin Treating Castulo Scarpelli/Extender: Altamese [place] in Treatment: 7 Vital Signs Time Taken: 14:14 Temperature (F): 97.6 Height (in): 68 Pulse (bpm): 66 Weight (lbs): 150 Respiratory Rate (breaths/min): 16 Body Mass Index (BMI): 22.8 Blood Pressure (mmHg):  113/99 Reference Range: 80 - 120 mg / dl Electronic Signature(s) Signed: 01/01/2018 4:29:16 PM By: Alejandro Mulling Entered By: Alejandro Mulling on 12/30/2017 14:16:07

## 2018-01-05 NOTE — Progress Notes (Signed)
FINCH, COSTANZO ([id number]) Visit Report for 12/30/2017 Debridement Details Patient Name: [name], [name]. Date of Service: 12/30/2017 2:00 PM Medical Record Number: [id number] Patient Account Number: [id number] Date of Birth/Sex: [birth year] (84 y.o. M) Treating RN: Huel Coventry Primary Care Provider: Guerry Bruin Other Clinician: Referring Provider: Guerry Bruin Treating Provider/Extender: Altamese [place] in Treatment: 7 Debridement Performed for Wound #13 Left,Anterior Lower Leg Assessment: Performed By: Physician Maxwell Caul, MD Debridement Type: Debridement Severity of Tissue Pre Limited to breakdown of skin Debridement: Pre-procedure Verification/Time Yes - 14:35 Out Taken: Start Time: 14:35 Pain Control: Other : lidocaine 4% Total Area Debrided (L x W): 0.7 (cm) x 0.4 (cm) = 0.28 (cm) Tissue and other material Non-Viable, Eschar debrided: Level: Non-Viable Tissue Debridement Description: Selective/Open Wound Instrument: Curette Bleeding: None End Time: 14:38 Procedural Pain: 0 Post Procedural Pain: 0 Response to Treatment: Procedure was tolerated well Post Debridement Measurements of Total Wound Length: (cm) 0.2 Width: (cm) 0.2 Depth: (cm) 0.1 Volume: (cm) 0.003 Character of Wound/Ulcer Post Debridement: Improved Severity of Tissue Post Debridement: Limited to breakdown of skin Post Procedure Diagnosis Same as Pre-procedure Electronic Signature(s) Signed: 12/30/2017 5:40:08 PM By: Baltazar Najjar MD Signed: 12/30/2017 8:51:29 PM By: Elliot Gurney, [name], RN, CWS, [name], [name] Entered By: Baltazar Najjar on 12/30/2017 15:20:32 [name] ([id number]) -------------------------------------------------------------------------------- Debridement Details Patient Name: Donald Musca C. Date of Service: 12/30/2017 2:00 PM Medical Record Number: [id number] Patient Account Number: [id number] Date of Birth/Sex: 03/12/1929 (84 y.o. M) Treating  RN: Huel Coventry Primary Care Provider: Guerry Bruin Other Clinician: Referring Provider: Guerry Bruin Treating Provider/Extender: Altamese [place] in Treatment: 7 Debridement Performed for Wound #5 Left,Medial Malleolus Assessment: Performed By: Physician Maxwell Caul, MD Debridement Type: Debridement Severity of Tissue Pre Fat layer exposed Debridement: Pre-procedure Verification/Time Yes - 14:35 Out Taken: Start Time: 14:35 Pain Control: Other : lidocaine 4% Total Area Debrided (L x W): 0.8 (cm) x 0.7 (cm) = 0.56 (cm) Tissue and other material Viable, Non-Viable, Slough, Subcutaneous, Slough debrided: Level: Skin/Subcutaneous Tissue Debridement Description: Excisional Instrument: Curette Bleeding: None End Time: 14:38 Procedural Pain: 3 Post Procedural Pain: 3 Response to Treatment: Procedure was tolerated well Post Debridement Measurements of Total Wound Length: (cm) 0.8 Width: (cm) 0.7 Depth: (cm) 0.3 Volume: (cm) 0.132 Character of Wound/Ulcer Post Debridement: Stable Severity of Tissue Post Debridement: Fat layer exposed Post Procedure Diagnosis Same as Pre-procedure Electronic Signature(s) Signed: 12/30/2017 5:40:08 PM By: Baltazar Najjar MD Signed: 12/30/2017 8:51:29 PM By: Elliot Gurney, [name], RN, CWS, [name], [name] Entered By: Baltazar Najjar on 12/30/2017 15:20:50 [name] ([id number]) -------------------------------------------------------------------------------- HPI Details Patient Name: Donald Musca C. Date of Service: 12/30/2017 2:00 PM Medical Record Number: [id number] Patient Account Number: [id number] Date of Birth/Sex: 1929-01-24 (84 y.o. M) Treating RN: Huel Coventry Primary Care Provider: Guerry Bruin Other Clinician: Referring Provider: Guerry Bruin Treating Provider/Extender: Altamese [place] in Treatment: 7 History of Present Illness HPI Description: Pleasant 84 year old with history of chronic venous  insufficiency, remote history of DVT, atrial fibrillation (on Coumadin). No history of diabetes. He says that he developed worsening left lower extremity swelling, blisters, and subsequent ulcerations in late October 2016. He was started on Augmentin for left lower extremity cellulitis, which has improved. Arterial ultrasound 07/27/2015 showed no significant peripheral arterial disease. Awaiting venous ultrasound. Performing dressing changes with silver alginate and using a Tubigrip for edema control. He denies any significant pain. No claudication or rest pain. Ambulating per his baseline with a walker. No fever  or chills. Moderate drainage. 10/10/15; the patient arrives back today having not been seen in almost 2 months. He has no open wounds on his legs. He has a history of venous insufficiency, arterial insufficiency. He has chronic Coumadin use secondary to atrial fibrillation and a remote history of DVT. Fortunately as he has not been here in 2 months we were not able to order him stockings. He is not able to get on standard compression hose. Juxtalite stockings would cost him probably about $100 appear, the couple was not able to afford this. 11/11/17 READMISSION This is an 84 year old man who is not a diabetic. He does have a history of sick sinus syndrome atrial fibrillation and has a cardiac pacemaker and is on Coumadin.he also has a history of DVT. His wife states that episodically over the last 2 years he has had wounds on his lower extremities but they have healed with home measures. Roughly 2-3 weeks ago the legs became a lot more swollen and he has had draining areas on the right anterior medial and lateral and the left medial leg. The patient's wife states that they were told to put support stockings on him but they are concerned because they worsen the blisters. this bit difficult to get the history but they think the edema has been increasing over the last several months. 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 He does not complain of chest pain orthopnea. He does not have a history of kidney issues that he is aware. Primary doctor at [facility]. He is not on diuretics. He was here 2 years ago. He was noted to have edema in his legs at that time. He had an ultrasound of his legs which I'll need to review. There was discussion about compression stockings but he is complaining clearly not using them. ABI in the right in this clinic was 1.37, 1.07 on the left 11/18/17; this is an elderly frail man who has bilateral small lower extremity wounds in the setting of very significant multifactorial edema. He probably has chronic venous insufficiency with lymphedema but it is clearly fluid overloaded last week. He apparently has started on diuretics through his primary physician at [facility] but the patient's wife is not sure which one but states that he goes to the bathroom every 15 minutes. He is tolerating compression wraps. His wife stated that they charged him full price stating that he didn't have open wounds on his legs that would cover wraparound stockings. His edema is much better controlled 11/25/17-he is here in follow-up evaluation for bilateral lower extremity ulcers. He is tolerating compression therapy. There was one episode since last appointment with copious amounts of drainage from the right medial malleolus. We will add additional absorptive dressing, otherwise no change in treatment plan and he will follow-up next week 12/02/17; since I last saw this man is edema control is a lot better and I think this is a combination of diuretic therapy and are 3 layer compression. He has severe bilateral venous insufficiency with hemosiderin deposition and likely chronic stasis dermatitis/inflammation. In spite of the edema control he has several weeping areas on both legs including the left lateral leg, area on the  right posterior leg. He also has an excoriated area just below his right tibial tuberosity. Fortuitously the patient has a follow-up with cardiology tomorrow 12/09/17; his edema control is excellent now on 40 mg of Lasix on his cardiologist last week and  according to his wife was thoroughly evaluated including checking his pacemaker and no medication changes are made. He has good edema control in his lower legs. He has significant chronic venous insufficiency with hemosiderin physician as well as stasis dermatitis. major areas on the medial malleoli Barbara, Hannah C. ([id number]) 12/16/17; he has excellent edema control however he is still having a lot of drainage and a lot of discomfort on the medial part of his right ankle/calf. The only other wound of any substances on the left medial malleolus. He is not been systemically unwell. 12/23/17; he has excellent edema control however he still has weeping edema fluid coming out of the medial part of the right ankle. The exact cause of this is unclear I gave him antibiotics last week however I don't really think that this is made a lot of difference. The other wound is on the left medial malleolus. This required debridement 12/30/17; he has good edema control bilaterally. Still having some weeping edema coming up the medial part of the right ankle. I suspect he has chronic venous inflammation/raises dermatitis. Some component of lymphedema Electronic Signature(s) Signed: 12/30/2017 5:40:08 PM By: Baltazar Najjar MD Entered By: Baltazar Najjar on 12/30/2017 15:22:01 [name] ([id number]) -------------------------------------------------------------------------------- Physical Exam Details Patient Name: Donald Musca C. Date of Service: 12/30/2017 2:00 PM Medical Record Number: [id number] Patient Account Number: [id number] Date of Birth/Sex: August 11, 1929 (84 y.o. M) Treating RN: Huel Coventry Primary Care Provider: Guerry Bruin Other  Clinician: Referring Provider: Guerry Bruin Treating Provider/Extender: Altamese [place] in Treatment: 7 Constitutional Sitting or standing Blood Pressure is within target range for patient.. Pulse regular and within target range for patient.Marland Kitchen Respirations regular, non-labored and within target range.. Temperature is normal and within the target range for the patient.Marland Kitchen appears in no distress.frail elderly patient. Respiratory Respiratory effort is easy and symmetric bilaterally. Rate is normal at rest and on room air.. Cardiovascular . Notes wound exam oWeeping open area on the right medial ankle is only a tiny open area remaining. Surrounding this there is a large area of tender inflammation. I think the areas sclerosed. oOn the left leg. He had a small open area anteriorly that was eschar. This was removed with a #3 curet there is still a small open area remaining. Over the left medial malleolus nonviable eschar around the wound bed. Necrotic surface debris removed from the surface hemostasis with direct pressure Electronic Signature(s) Signed: 12/30/2017 5:40:08 PM By: Baltazar Najjar MD Entered By: Baltazar Najjar on 12/30/2017 15:26:22 [name] ([id number]) -------------------------------------------------------------------------------- Physician Orders Details Patient Name: Donald Musca C. Date of Service: 12/30/2017 2:00 PM Medical Record Number: [id number] Patient Account Number: [id number] Date of Birth/Sex: [birth year] (84 y.o. M) Treating RN: Phillis Haggis Primary Care Provider: Guerry Bruin Other Clinician: Referring Provider: Guerry Bruin Treating Provider/Extender: Altamese [place] in Treatment: 7 Verbal / Phone Orders: No Diagnosis Coding Wound Cleansing Wound #10 Right,Medial Malleolus o Cleanse wound with mild soap and water - HHRN please wash legs Wound #13 Left,Anterior Lower Leg o Cleanse wound with mild soap and  water - HHRN please wash legs Wound #5 Left,Medial Malleolus o Cleanse wound with mild soap and water - HHRN please wash legs Anesthetic (add to Medication List) Wound #10 Right,Medial Malleolus o Topical Lidocaine 4% cream applied to wound bed prior to debridement (In Clinic Only). Wound #13 Left,Anterior Lower Leg o Topical Lidocaine 4% cream applied to wound bed prior to debridement (In Clinic Only). Wound #5 Left,Medial Malleolus o Topical  Lidocaine 4% cream applied to wound bed prior to debridement (In Clinic Only). Skin Barriers/Peri-Wound Care Wound #10 Right,Medial Malleolus o Moisturizing lotion - HHRN to moisturize o Triamcinolone Acetonide Ointment (TAC) - Right foot ankle reddened area Wound #13 Left,Anterior Lower Leg o Moisturizing lotion - HHRN to moisturize o Triamcinolone Acetonide Ointment (TAC) - Right foot ankle reddened area Wound #5 Left,Medial Malleolus o Moisturizing lotion - HHRN to moisturize o Triamcinolone Acetonide Ointment (TAC) - Right foot ankle reddened area Primary Wound Dressing Wound #10 Right,Medial Malleolus o Silver Alginate Wound #13 Left,Anterior Lower Leg o Silver Alginate Wound #5 Left,Medial Malleolus o Silver Alginate Secondary Dressing Holan, Esiquio C. ([id number]) Wound #10 Right,Medial Malleolus o ABD pad - cover all wounds o Foam - Reddened area at top of wraps bilaterally o Drawtex - Drawtex Dressing Change Frequency Wound #10 Right,Medial Malleolus o Change Dressing Monday, Wednesday, Friday Wound #13 Left,Anterior Lower Leg o Change Dressing Monday, Wednesday, Friday Wound #5 Left,Medial Malleolus o Change Dressing Monday, Wednesday, Friday Follow-up Appointments Wound #10 Right,Medial Malleolus o Return Appointment in 1 week. Wound #13 Left,Anterior Lower Leg o Return Appointment in 1 week. Wound #5 Left,Medial Malleolus o Return Appointment in 1 week. Edema Control Wound  #10 Right,Medial Malleolus o 3 Layer Compression System - Bilateral - Please do not place coban directly on legs or roll down tops of wraps. o Elevate legs to the level of the heart and pump ankles as often as possible Wound #13 Left,Anterior Lower Leg o 3 Layer Compression System - Bilateral - Please do not place coban directly on legs or roll down tops of wraps. o Elevate legs to the level of the heart and pump ankles as often as possible Wound #5 Left,Medial Malleolus o 3 Layer Compression System - Bilateral - Please do not place coban directly on legs or roll down tops of wraps. o Elevate legs to the level of the heart and pump ankles as often as possible Additional Orders / Instructions Wound #10 Right,Medial Malleolus o Vitamin A; Vitamin C, Zinc o Activity as tolerated Wound #13 Left,Anterior Lower Leg o Vitamin A; Vitamin C, Zinc o Activity as tolerated Wound #5 Left,Medial Malleolus o Vitamin A; Vitamin C, Zinc o Activity as tolerated Home Health Wound #10 Right,Medial Malleolus o Continue Home Health Visits o Home Health Nurse may visit PRN to address patientos wound care needs. [name], [name] ([id number]) o FACE TO FACE ENCOUNTER: MEDICARE and MEDICAID PATIENTS: I certify that this patient is under my care and that I had a face-to-face encounter that meets the physician face-to-face encounter requirements with this patient on this date. The encounter with the patient was in whole or in part for the following MEDICAL CONDITION: (primary reason for Home Healthcare) MEDICAL NECESSITY: I certify, that based on my findings, NURSING services are a medically necessary home health service. HOME BOUND STATUS: I certify that my clinical findings support that this patient is homebound (i.e., Due to illness or injury, pt requires aid of supportive devices such as crutches, cane, wheelchairs, walkers, the use of special transportation or the assistance  of another person to leave their place of residence. There is a normal inability to leave the home and doing so requires considerable and taxing effort. Other absences are for medical reasons / religious services and are infrequent or of short duration when for other reasons). o If current dressing causes regression in wound condition, may D/C ordered dressing product/s and apply Normal Saline Moist Dressing daily until next Wound  Healing Center / Other MD appointment. Notify Wound Healing Center of regression in wound condition at [phone]. o Please direct any NON-WOUND related issues/requests for orders to patient's Primary Care Physician Wound #13 Left,Anterior Lower Leg o Continue Home Health Visits o Home Health Nurse may visit PRN to address patientos wound care needs. o FACE TO FACE ENCOUNTER: MEDICARE and MEDICAID PATIENTS: I certify that this patient is under my care and that I had a face-to-face encounter that meets the physician face-to-face encounter requirements with this patient on this date. The encounter with the patient was in whole or in part for the following MEDICAL CONDITION: (primary reason for Home Healthcare) MEDICAL NECESSITY: I certify, that based on my findings, NURSING services are a medically necessary home health service. HOME BOUND STATUS: I certify that my clinical findings support that this patient is homebound (i.e., Due to illness or injury, pt requires aid of supportive devices such as crutches, cane, wheelchairs, walkers, the use of special transportation or the assistance of another person to leave their place of residence. There is a normal inability to leave the home and doing so requires considerable and taxing effort. Other absences are for medical reasons / religious services and are infrequent or of short duration when for other reasons). o If current dressing causes regression in wound condition, may D/C ordered dressing product/s and  apply Normal Saline Moist Dressing daily until next Wound Healing Center / Other MD appointment. Notify Wound Healing Center of regression in wound condition at [phone]. o Please direct any NON-WOUND related issues/requests for orders to patient's Primary Care Physician Wound #5 Left,Medial Malleolus o Continue Home Health Visits o Home Health Nurse may visit PRN to address patientos wound care needs. o FACE TO FACE ENCOUNTER: MEDICARE and MEDICAID PATIENTS: I certify that this patient is under my care and that I had a face-to-face encounter that meets the physician face-to-face encounter requirements with this patient on this date. The encounter with the patient was in whole or in part for the following MEDICAL CONDITION: (primary reason for Home Healthcare) MEDICAL NECESSITY: I certify, that based on my findings, NURSING services are a medically necessary home health service. HOME BOUND STATUS: I certify that my clinical findings support that this patient is homebound (i.e., Due to illness or injury, pt requires aid of supportive devices such as crutches, cane, wheelchairs, walkers, the use of special transportation or the assistance of another person to leave their place of residence. There is a normal inability to leave the home and doing so requires considerable and taxing effort. Other absences are for medical reasons / religious services and are infrequent or of short duration when for other reasons). o If current dressing causes regression in wound condition, may D/C ordered dressing product/s and apply Normal Saline Moist Dressing daily until next Wound Healing Center / Other MD appointment. Notify Wound Healing Center of regression in wound condition at [phone]. o Please direct any NON-WOUND related issues/requests for orders to patient's Primary Care Physician Medications-please add to medication list. Wound #10 Right,Medial Malleolus o P.O.  Antibiotics Wound #13 Left,Anterior Lower Leg o P.O. Antibiotics [name], [name] ([id number]) Wound #5 Left,Medial Malleolus o P.O. Antibiotics Patient Medications Allergies: No Known Drug Allergies Notifications Medication Indication Start End lidocaine DOSE topical 4 % cream - cream topical Electronic Signature(s) Signed: 12/30/2017 5:40:08 PM By: Baltazar Najjar MD Signed: 01/01/2018 4:29:16 PM By: Alejandro Mulling Previous Signature: 12/30/2017 2:46:02 PM Version By: Baltazar Najjar MD Entered By: Alejandro Mulling on  12/30/2017 14:50:33 [name], [name] ([id number]) -------------------------------------------------------------------------------- Prescription 12/30/2017 Patient Name: [name] Provider: Maxwell Caul MD Date of Birth: [birth year] NPI#: [id number] Sex: M DEA#: [id number] Phone #: [phone] License #: [id number] Patient Address: [facility] [address] [facility] [place], [place] [address] [address] [place], [place] [address] [id number] Allergies No Known Drug Allergies Medication Medication: Route: Strength: Form: lidocaine topical 4% cream Class: TOPICAL LOCAL ANESTHETICS Dose: Frequency / Time: Indication: cream topical Number of Refills: Number of Units: 0 Generic Substitution: Start Date: End Date: Administered at Substitution Permitted Facility: Yes Time Administered: Time Discontinued: Note to Pharmacy: Signature(s): Date(s): Electronic Signature(s) Signed: 12/30/2017 5:40:08 PM By: Baltazar Najjar MD Signed: 01/01/2018 4:29:16 PM By: Alejandro Mulling Entered By: Alejandro Mulling on 12/30/2017 14:50:37 [name] ([id number]) [name], [name] ([id number]) --------------------------------------------------------------------------------  Problem List Details Patient Name: Donald Musca C. Date of Service: 12/30/2017 2:00  PM Medical Record Number: [id number] Patient Account Number: [id number] Date of Birth/Sex: 03-15-1929 (84 y.o. M) Treating RN: Huel Coventry Primary Care Provider: Guerry Bruin Other Clinician: Referring Provider: Guerry Bruin Treating Provider/Extender: Altamese [place] in Treatment: 7 Active Problems ICD-10 Impacting Encounter Code Description Active Date Wound Healing Diagnosis L97.211 Non-pressure chronic ulcer of right calf limited to breakdown 11/11/2017 Yes of skin L97.221 Non-pressure chronic ulcer of left calf limited to breakdown of 11/11/2017 Yes skin I87.333 Chronic venous hypertension (idiopathic) with ulcer and 11/11/2017 Yes inflammation of bilateral lower extremity Inactive Problems Resolved Problems Electronic Signature(s) Signed: 12/30/2017 5:40:08 PM By: Baltazar Najjar MD Entered By: Baltazar Najjar on 12/30/2017 15:19:56 [name], [name] ([id number]) -------------------------------------------------------------------------------- Progress Note Details Patient Name: Donald Musca C. Date of Service: 12/30/2017 2:00 PM Medical Record Number: [id number] Patient Account Number: [id number] Date of Birth/Sex: 01-13-1929 (84 y.o. M) Treating RN: Huel Coventry Primary Care Provider: Guerry Bruin Other Clinician: Referring Provider: Guerry Bruin Treating Provider/Extender: Altamese [place] in Treatment: 7 Subjective History of Present Illness (HPI) Pleasant 84 year old with history of chronic venous insufficiency, remote history of DVT, atrial fibrillation (on Coumadin). No history of diabetes. He says that he developed worsening left lower extremity swelling, blisters, and subsequent ulcerations in late October 2016. He was started on Augmentin for left lower extremity cellulitis, which has improved. Arterial ultrasound 07/27/2015 showed no significant peripheral arterial disease. Awaiting venous ultrasound. Performing dressing changes with  silver alginate and using a Tubigrip for edema control. He denies any significant pain. No claudication or rest pain. Ambulating per his baseline with a walker. No fever or chills. Moderate drainage. 10/10/15; the patient arrives back today having not been seen in almost 2 months. He has no open wounds on his legs. He has a history of venous insufficiency, arterial insufficiency. He has chronic Coumadin use secondary to atrial fibrillation and a remote history of DVT. Fortunately as he has not been here in 2 months we were not able to order him stockings. He is not able to get on standard compression hose. Juxtalite stockings would cost him probably about $100 appear, the couple was not able to afford this. 11/11/17 READMISSION This is an 84 year old man who is not a diabetic. He does have a history of sick sinus syndrome atrial fibrillation and has a cardiac pacemaker and is on Coumadin.he also has a history of DVT. His wife states that episodically over the last 2 years he has had wounds on his lower extremities but they have healed with home measures. Roughly 2-3  weeks ago the legs became a lot more swollen and he has had draining areas on the right anterior medial and lateral and the left medial leg. The patient's wife states that they were told to put support stockings on him but they are concerned because they worsen the blisters. this bit difficult to get the history but they think the edema has been increasing over the last several months. 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 He does not complain of chest pain orthopnea. He does not have a history of kidney issues that he is aware. Primary doctor at [facility]. He is not on diuretics. He was here 2 years ago. He was noted to have edema in his legs at that time. He had an ultrasound of his legs which I'll need to review. There was discussion about compression stockings but he is complaining  clearly not using them. ABI in the right in this clinic was 1.37, 1.07 on the left 11/18/17; this is an elderly frail man who has bilateral small lower extremity wounds in the setting of very significant multifactorial edema. He probably has chronic venous insufficiency with lymphedema but it is clearly fluid overloaded last week. He apparently has started on diuretics through his primary physician at [facility] but the patient's wife is not sure which one but states that he goes to the bathroom every 15 minutes. He is tolerating compression wraps. His wife stated that they charged him full price stating that he didn't have open wounds on his legs that would cover wraparound stockings. His edema is much better controlled 11/25/17-he is here in follow-up evaluation for bilateral lower extremity ulcers. He is tolerating compression therapy. There was one episode since last appointment with copious amounts of drainage from the right medial malleolus. We will add additional absorptive dressing, otherwise no change in treatment plan and he will follow-up next week 12/02/17; since I last saw this man is edema control is a lot better and I think this is a combination of diuretic therapy and are 3 layer compression. He has severe bilateral venous insufficiency with hemosiderin deposition and likely chronic stasis dermatitis/inflammation. In spite of the edema control he has several weeping areas on both legs including the left lateral leg, area on the right posterior leg. He also has an excoriated area just below his right tibial tuberosity. Fortuitously the patient has a follow-up with cardiology tomorrow 12/09/17; his edema control is excellent now on 40 mg of Lasix on his cardiologist last week and according to his wife was thoroughly evaluated including checking his pacemaker and no medication changes are made. He has good edema control in his lower legs. He has significant chronic venous  insufficiency with hemosiderin physician as well as stasis dermatitis. major [name], [name] ([id number]) areas on the medial malleoli 12/16/17; he has excellent edema control however he is still having a lot of drainage and a lot of discomfort on the medial part of his right ankle/calf. The only other wound of any substances on the left medial malleolus. He is not been systemically unwell. 12/23/17; he has excellent edema control however he still has weeping edema fluid coming out of the medial part of the right ankle. The exact cause of this is unclear I gave him antibiotics last week however I don't really think that this is made a lot of difference. The other wound is on the left medial malleolus. This required debridement 12/30/17; he has  good edema control bilaterally. Still having some weeping edema coming up the medial part of the right ankle. I suspect he has chronic venous inflammation/raises dermatitis. Some component of lymphedema Objective Constitutional Sitting or standing Blood Pressure is within target range for patient.. Pulse regular and within target range for patient.Marland Kitchen Respirations regular, non-labored and within target range.. Temperature is normal and within the target range for the patient.Marland Kitchen appears in no distress.frail elderly patient. Vitals Time Taken: 2:14 PM, Height: 68 in, Weight: 150 lbs, BMI: 22.8, Temperature: 97.6 F, Pulse: 66 bpm, Respiratory Rate: 16 breaths/min, Blood Pressure: 113/99 mmHg. Respiratory Respiratory effort is easy and symmetric bilaterally. Rate is normal at rest and on room air.. General Notes: wound exam Weeping open area on the right medial ankle is only a tiny open area remaining. Surrounding this there is a large area of tender inflammation. I think the areas sclerosed. On the left leg. He had a small open area anteriorly that was eschar. This was removed with a #3 curet there is still a small open area remaining. Over the left  medial malleolus nonviable eschar around the wound bed. Necrotic surface debris removed from the surface hemostasis with direct pressure Integumentary (Hair, Skin) Wound #10 status is Open. Original cause of wound was Gradually Appeared. The wound is located on the Right,Medial Malleolus. The wound measures 0.3cm length x 0.3cm width x 0.1cm depth; 0.071cm^2 area and 0.007cm^3 volume. Wound #13 status is Open. Original cause of wound was Gradually Appeared. The wound is located on the Left,Anterior Lower Leg. The wound measures 0.7cm length x 0.4cm width x 0.1cm depth; 0.22cm^2 area and 0.022cm^3 volume. Wound #5 status is Open. Original cause of wound was Blister. The wound is located on the Left,Medial Malleolus. The wound measures 0.8cm length x 0.7cm width x 0.2cm depth; 0.44cm^2 area and 0.088cm^3 volume. Assessment Active Problems ICD-10 L97.211 - Non-pressure chronic ulcer of right calf limited to breakdown of skin [name], [name] ([id number]) L97.221 - Non-pressure chronic ulcer of left calf limited to breakdown of skin I87.333 - Chronic venous hypertension (idiopathic) with ulcer and inflammation of bilateral lower extremity Procedures Wound #13 Pre-procedure diagnosis of Wound #13 is a Venous Leg Ulcer located on the Left,Anterior Lower Leg .Severity of Tissue Pre Debridement is: Limited to breakdown of skin. There was a Selective/Open Wound Non-Viable Tissue Debridement with a total area of 0.28 sq cm performed by Maxwell Caul, MD. With the following instrument(s): Curette. to remove Non- Viable tissue/material Material removed includes Eschar after achieving pain control using Other (lidocaine 4%). No specimens were taken. A time out was conducted at 14:35, prior to the start of the procedure. There was no bleeding. The procedure was tolerated well with a pain level of 0 throughout and a pain level of 0 following the procedure. Post Debridement Measurements: 0.2cm length  x 0.2cm width x 0.1cm depth; 0.003cm^3 volume. Character of Wound/Ulcer Post Debridement is improved. Severity of Tissue Post Debridement is: Limited to breakdown of skin. Post procedure Diagnosis Wound #13: Same as Pre-Procedure Wound #5 Pre-procedure diagnosis of Wound #5 is a Venous Leg Ulcer located on the Left,Medial Malleolus .Severity of Tissue Pre Debridement is: Fat layer exposed. There was a Excisional Skin/Subcutaneous Tissue Debridement with a total area of 0.56 sq cm performed by Maxwell Caul, MD. With the following instrument(s): Curette. to remove Viable and Non-Viable tissue/material Material removed includes Subcutaneous Tissue, and Slough, and [place] after achieving pain control using Other (lidocaine 4%). No specimens were taken.  A time out was conducted at 14:35, prior to the start of the procedure. There was no bleeding. The procedure was tolerated well with a pain level of 3 throughout and a pain level of 3 following the procedure. Post Debridement Measurements: 0.8cm length x 0.7cm width x 0.3cm depth; 0.132cm^3 volume. Character of Wound/Ulcer Post Debridement is stable. Severity of Tissue Post Debridement is: Fat layer exposed. Post procedure Diagnosis Wound #5: Same as Pre-Procedure Plan Wound Cleansing: Wound #10 Right,Medial Malleolus: Cleanse wound with mild soap and water - HHRN please wash legs Wound #13 Left,Anterior Lower Leg: Cleanse wound with mild soap and water - HHRN please wash legs Wound #5 Left,Medial Malleolus: Cleanse wound with mild soap and water - HHRN please wash legs Anesthetic (add to Medication List): Wound #10 Right,Medial Malleolus: Topical Lidocaine 4% cream applied to wound bed prior to debridement (In Clinic Only). Wound #13 Left,Anterior Lower Leg: Topical Lidocaine 4% cream applied to wound bed prior to debridement (In Clinic Only). Wound #5 Left,Medial Malleolus: Topical Lidocaine 4% cream applied to wound bed prior to  debridement (In Clinic Only). Skin Barriers/Peri-Wound Care: Wound #10 Right,Medial Malleolus: Moisturizing lotion - HHRN to moisturize [name], [name] ([id number]) Triamcinolone Acetonide Ointment (TAC) - Right foot ankle reddened area Wound #13 Left,Anterior Lower Leg: Moisturizing lotion - HHRN to moisturize Triamcinolone Acetonide Ointment (TAC) - Right foot ankle reddened area Wound #5 Left,Medial Malleolus: Moisturizing lotion - HHRN to moisturize Triamcinolone Acetonide Ointment (TAC) - Right foot ankle reddened area Primary Wound Dressing: Wound #10 Right,Medial Malleolus: Silver Alginate Wound #13 Left,Anterior Lower Leg: Silver Alginate Wound #5 Left,Medial Malleolus: Silver Alginate Secondary Dressing: Wound #10 Right,Medial Malleolus: ABD pad - cover all wounds Foam - Reddened area at top of wraps bilaterally Drawtex - Drawtex Dressing Change Frequency: Wound #10 Right,Medial Malleolus: Change Dressing Monday, Wednesday, Friday Wound #13 Left,Anterior Lower Leg: Change Dressing Monday, Wednesday, Friday Wound #5 Left,Medial Malleolus: Change Dressing Monday, Wednesday, Friday Follow-up Appointments: Wound #10 Right,Medial Malleolus: Return Appointment in 1 week. Wound #13 Left,Anterior Lower Leg: Return Appointment in 1 week. Wound #5 Left,Medial Malleolus: Return Appointment in 1 week. Edema Control: Wound #10 Right,Medial Malleolus: 3 Layer Compression System - Bilateral - Please do not place coban directly on legs or roll down tops of wraps. Elevate legs to the level of the heart and pump ankles as often as possible Wound #13 Left,Anterior Lower Leg: 3 Layer Compression System - Bilateral - Please do not place coban directly on legs or roll down tops of wraps. Elevate legs to the level of the heart and pump ankles as often as possible Wound #5 Left,Medial Malleolus: 3 Layer Compression System - Bilateral - Please do not place coban directly on legs or  roll down tops of wraps. Elevate legs to the level of the heart and pump ankles as often as possible Additional Orders / Instructions: Wound #10 Right,Medial Malleolus: Vitamin A; Vitamin C, Zinc Activity as tolerated Wound #13 Left,Anterior Lower Leg: Vitamin A; Vitamin C, Zinc Activity as tolerated Wound #5 Left,Medial Malleolus: Vitamin A; Vitamin C, Zinc Activity as tolerated Home Health: Wound #10 Right,Medial Malleolus: Continue Home Health Visits Home Health Nurse may visit PRN to address patient s wound care needs. FACE TO FACE ENCOUNTER: MEDICARE and MEDICAID PATIENTS: I certify that this patient is under my care and that I had a face-to-face encounter that meets the physician face-to-face encounter requirements with this patient on this date. The encounter with the patient was in whole or in part  for the following MEDICAL CONDITION: (primary reason for Home [name], [name] ([id number]) Healthcare) MEDICAL NECESSITY: I certify, that based on my findings, NURSING services are a medically necessary home health service. HOME BOUND STATUS: I certify that my clinical findings support that this patient is homebound (i.e., Due to illness or injury, pt requires aid of supportive devices such as crutches, cane, wheelchairs, walkers, the use of special transportation or the assistance of another person to leave their place of residence. There is a normal inability to leave the home and doing so requires considerable and taxing effort. Other absences are for medical reasons / religious services and are infrequent or of short duration when for other reasons). If current dressing causes regression in wound condition, may D/C ordered dressing product/s and apply Normal Saline Moist Dressing daily until next Wound Healing Center / Other MD appointment. Notify Wound Healing Center of regression in wound condition at [phone]. Please direct any NON-WOUND related issues/requests for orders  to patient's Primary Care Physician Wound #13 Left,Anterior Lower Leg: Continue Home Health Visits Home Health Nurse may visit PRN to address patient s wound care needs. FACE TO FACE ENCOUNTER: MEDICARE and MEDICAID PATIENTS: I certify that this patient is under my care and that I had a face-to-face encounter that meets the physician face-to-face encounter requirements with this patient on this date. The encounter with the patient was in whole or in part for the following MEDICAL CONDITION: (primary reason for Home Healthcare) MEDICAL NECESSITY: I certify, that based on my findings, NURSING services are a medically necessary home health service. HOME BOUND STATUS: I certify that my clinical findings support that this patient is homebound (i.e., Due to illness or injury, pt requires aid of supportive devices such as crutches, cane, wheelchairs, walkers, the use of special transportation or the assistance of another person to leave their place of residence. There is a normal inability to leave the home and doing so requires considerable and taxing effort. Other absences are for medical reasons / religious services and are infrequent or of short duration when for other reasons). If current dressing causes regression in wound condition, may D/C ordered dressing product/s and apply Normal Saline Moist Dressing daily until next Wound Healing Center / Other MD appointment. Notify Wound Healing Center of regression in wound condition at [phone]. Please direct any NON-WOUND related issues/requests for orders to patient's Primary Care Physician Wound #5 Left,Medial Malleolus: Continue Home Health Visits Home Health Nurse may visit PRN to address patient s wound care needs. FACE TO FACE ENCOUNTER: MEDICARE and MEDICAID PATIENTS: I certify that this patient is under my care and that I had a face-to-face encounter that meets the physician face-to-face encounter requirements with this patient on this  date. The encounter with the patient was in whole or in part for the following MEDICAL CONDITION: (primary reason for Home Healthcare) MEDICAL NECESSITY: I certify, that based on my findings, NURSING services are a medically necessary home health service. HOME BOUND STATUS: I certify that my clinical findings support that this patient is homebound (i.e., Due to illness or injury, pt requires aid of supportive devices such as crutches, cane, wheelchairs, walkers, the use of special transportation or the assistance of another person to leave their place of residence. There is a normal inability to leave the home and doing so requires considerable and taxing effort. Other absences are for medical reasons / religious services and are infrequent or of short duration when for other reasons). If current dressing causes  regression in wound condition, may D/C ordered dressing product/s and apply Normal Saline Moist Dressing daily until next Wound Healing Center / Other MD appointment. Notify Wound Healing Center of regression in wound condition at [phone]. Please direct any NON-WOUND related issues/requests for orders to patient's Primary Care Physician Medications-please add to medication list.: Wound #10 Right,Medial Malleolus: P.O. Antibiotics Wound #13 Left,Anterior Lower Leg: P.O. Antibiotics Wound #5 Left,Medial Malleolus: P.O. Antibiotics The following medication(s) was prescribed: lidocaine topical 4 % cream cream topical was prescribed at facility #1 to the left medial leg area we apply TCA and silver alginate under 3KLcompression. I'm not convinced there is 53-year-old perfusion to support a higher wrap strength. #22 small open areas on the left including anterior and the medial malleolus both debrided we will continue with silver alginate here [name], [name] ([id number]) #3 the degree of inflammation around the left medial ankle/medial calcaneus is impressive and tender and warm. I  think this is probably stasis dermatitis however I went ahead and gave him a week's worth of doxycycline Electronic Signature(s) Signed: 12/30/2017 5:40:08 PM By: Baltazar Najjar MD Entered By: Baltazar Najjar on 12/30/2017 15:27:55 [name] ([id number]) -------------------------------------------------------------------------------- SuperBill Details Patient Name: Donald Musca C. Date of Service: 12/30/2017 Medical Record Number: [id number] Patient Account Number: [id number] Date of Birth/Sex: December 20, 1928 (84 y.o. M) Treating RN: Huel Coventry Primary Care Provider: Guerry Bruin Other Clinician: Referring Provider: Guerry Bruin Treating Provider/Extender: Altamese Alpha in Treatment: 7 Diagnosis Coding ICD-10 Codes Code Description [phone] Non-pressure chronic ulcer of right calf limited to breakdown of skin L97.221 Non-pressure chronic ulcer of left calf limited to breakdown of skin I87.333 Chronic venous hypertension (idiopathic) with ulcer and inflammation of bilateral lower extremity L97.321 Non-pressure chronic ulcer of left ankle limited to breakdown of skin Facility Procedures CPT4 Code: [id number] Description: 11042 - DEB SUBQ TISSUE 20 SQ CM/< ICD-10 Diagnosis Description L97.321 Non-pressure chronic ulcer of left ankle limited to breakdown Modifier: of skin Quantity: 1 CPT4 Code: [id number] Description: 97597 - DEBRIDE WOUND 1ST 20 SQ CM OR < ICD-10 Diagnosis Description L97.221 Non-pressure chronic ulcer of left calf limited to breakdown o Modifier: 59 f skin Quantity: 1 Physician Procedures CPT4 Code: [id number] Description: 11042 - WC PHYS SUBQ TISS 20 SQ CM ICD-10 Diagnosis Description L97.321 Non-pressure chronic ulcer of left ankle limited to breakdown Modifier: of skin Quantity: 1 CPT4 Code: [id number] Description: 97597 - WC PHYS DEBR WO ANESTH 20 SQ CM ICD-10 Diagnosis Description L97.221 Non-pressure chronic ulcer of left calf limited to  breakdown o Modifier: 59 f skin Quantity: 1 Electronic Signature(s) Signed: 12/30/2017 5:40:08 PM By: Baltazar Najjar MD Entered By: Baltazar Najjar on 12/30/2017 15:30:00

## 2018-01-06 ENCOUNTER — Encounter: Payer: Medicare Other | Attending: Internal Medicine | Admitting: Internal Medicine

## 2018-01-06 DIAGNOSIS — I771 Stricture of artery: Secondary | ICD-10-CM | POA: Diagnosis not present

## 2018-01-06 DIAGNOSIS — I872 Venous insufficiency (chronic) (peripheral): Secondary | ICD-10-CM | POA: Diagnosis not present

## 2018-01-06 DIAGNOSIS — Z7901 Long term (current) use of anticoagulants: Secondary | ICD-10-CM | POA: Diagnosis not present

## 2018-01-06 DIAGNOSIS — I4891 Unspecified atrial fibrillation: Secondary | ICD-10-CM | POA: Insufficient documentation

## 2018-01-06 DIAGNOSIS — Z09 Encounter for follow-up examination after completed treatment for conditions other than malignant neoplasm: Secondary | ICD-10-CM | POA: Insufficient documentation

## 2018-01-06 DIAGNOSIS — Z86718 Personal history of other venous thrombosis and embolism: Secondary | ICD-10-CM | POA: Insufficient documentation

## 2018-01-06 DIAGNOSIS — R609 Edema, unspecified: Secondary | ICD-10-CM | POA: Diagnosis not present

## 2018-01-06 DIAGNOSIS — Z950 Presence of cardiac pacemaker: Secondary | ICD-10-CM | POA: Diagnosis not present

## 2018-01-06 DIAGNOSIS — I87313 Chronic venous hypertension (idiopathic) with ulcer of bilateral lower extremity: Secondary | ICD-10-CM | POA: Diagnosis not present

## 2018-01-06 DIAGNOSIS — L97312 Non-pressure chronic ulcer of right ankle with fat layer exposed: Secondary | ICD-10-CM | POA: Diagnosis not present

## 2018-01-06 DIAGNOSIS — L97322 Non-pressure chronic ulcer of left ankle with fat layer exposed: Secondary | ICD-10-CM | POA: Diagnosis not present

## 2018-01-08 DIAGNOSIS — L97221 Non-pressure chronic ulcer of left calf limited to breakdown of skin: Secondary | ICD-10-CM | POA: Diagnosis not present

## 2018-01-08 DIAGNOSIS — L97321 Non-pressure chronic ulcer of left ankle limited to breakdown of skin: Secondary | ICD-10-CM | POA: Diagnosis not present

## 2018-01-08 DIAGNOSIS — I4891 Unspecified atrial fibrillation: Secondary | ICD-10-CM | POA: Diagnosis not present

## 2018-01-08 DIAGNOSIS — L97211 Non-pressure chronic ulcer of right calf limited to breakdown of skin: Secondary | ICD-10-CM | POA: Diagnosis not present

## 2018-01-08 DIAGNOSIS — I10 Essential (primary) hypertension: Secondary | ICD-10-CM | POA: Diagnosis not present

## 2018-01-08 DIAGNOSIS — I87333 Chronic venous hypertension (idiopathic) with ulcer and inflammation of bilateral lower extremity: Secondary | ICD-10-CM | POA: Diagnosis not present

## 2018-01-09 NOTE — Progress Notes (Signed)
Donald, Potts ([id number]) Visit Report for 01/06/2018 Debridement Details Patient Name: [name], [name]. Date of Service: 01/06/2018 2:30 PM Medical Record Number: [id number] Patient Account Number: [id number] Date of Birth/Sex: 15-Jun-1929 (84 y.o. M) Treating RN: Curtis Sites Primary Care Provider: Guerry Bruin Other Clinician: Referring Provider: Guerry Bruin Treating Provider/Extender: Altamese [place] in Treatment: 8 Debridement Performed for Wound #10 Right,Medial Malleolus Assessment: Performed By: Physician Maxwell Caul, MD Debridement Type: Debridement Severity of Tissue Pre Fat layer exposed Debridement: Pre-procedure Verification/Time Yes - 15:25 Out Taken: Start Time: 15:25 Pain Control: Lidocaine 4% Topical Solution Total Area Debrided (L x W): 0.3 (cm) x 0.4 (cm) = 0.12 (cm) Tissue and other material Non-Viable, Eschar debrided: Level: Non-Viable Tissue Debridement Description: Selective/Open Wound Instrument: Curette Bleeding: Minimum Hemostasis Achieved: Pressure End Time: 15:27 Procedural Pain: 0 Post Procedural Pain: 0 Response to Treatment: Procedure was tolerated well Post Debridement Measurements of Total Wound Length: (cm) 0.3 Width: (cm) 0.4 Depth: (cm) 0.1 Volume: (cm) 0.009 Character of Wound/Ulcer Post Debridement: Improved Severity of Tissue Post Debridement: Fat layer exposed Post Procedure Diagnosis Same as Pre-procedure Electronic Signature(s) Signed: 01/06/2018 4:28:35 PM By: Baltazar Najjar MD Signed: 01/06/2018 4:48:19 PM By: Curtis Sites Entered By: Baltazar Najjar on 01/06/2018 16:21:57 [name] ([id number]) -------------------------------------------------------------------------------- Debridement Details Patient Name: Donald Musca C. Date of Service: 01/06/2018 2:30 PM Medical Record Number: [id number] Patient Account Number: [id number] Date of Birth/Sex: 22-Jan-1929 (84 y.o. M) Treating  RN: Curtis Sites Primary Care Provider: Guerry Bruin Other Clinician: Referring Provider: Guerry Bruin Treating Provider/Extender: Altamese [place] in Treatment: 8 Debridement Performed for Wound #5 Left,Medial Malleolus Assessment: Performed By: Physician Maxwell Caul, MD Debridement Type: Debridement Severity of Tissue Pre Fat layer exposed Debridement: Pre-procedure Verification/Time Yes - 15:27 Out Taken: Start Time: 15:27 Pain Control: Lidocaine 4% Topical Solution Total Area Debrided (L x W): 0.6 (cm) x 0.8 (cm) = 0.48 (cm) Tissue and other material Viable, Non-Viable, Eschar, Slough, Subcutaneous, Slough debrided: Level: Skin/Subcutaneous Tissue Debridement Description: Excisional Instrument: Curette Bleeding: Minimum Hemostasis Achieved: Pressure End Time: 15:29 Procedural Pain: 0 Post Procedural Pain: 0 Response to Treatment: Procedure was tolerated well Post Debridement Measurements of Total Wound Length: (cm) 0.6 Width: (cm) 0.8 Depth: (cm) 0.2 Volume: (cm) 0.075 Character of Wound/Ulcer Post Debridement: Improved Severity of Tissue Post Debridement: Fat layer exposed Post Procedure Diagnosis Same as Pre-procedure Electronic Signature(s) Signed: 01/06/2018 4:28:35 PM By: Baltazar Najjar MD Signed: 01/06/2018 4:48:19 PM By: Curtis Sites Entered By: Baltazar Najjar on 01/06/2018 16:22:11 [name] ([id number]) -------------------------------------------------------------------------------- HPI Details Patient Name: Donald Musca C. Date of Service: 01/06/2018 2:30 PM Medical Record Number: [id number] Patient Account Number: [id number] Date of Birth/Sex: [birth year] (84 y.o. M) Treating RN: Curtis Sites Primary Care Provider: Guerry Bruin Other Clinician: Referring Provider: Guerry Bruin Treating Provider/Extender: Altamese [place] in Treatment: 8 History of Present Illness HPI Description: Pleasant  84 year old with history of chronic venous insufficiency, remote history of DVT, atrial fibrillation (on Coumadin). No history of diabetes. He says that he developed worsening left lower extremity swelling, blisters, and subsequent ulcerations in late October 2016. He was started on Augmentin for left lower extremity cellulitis, which has improved. Arterial ultrasound 07/27/2015 showed no significant peripheral arterial disease. Awaiting venous ultrasound. Performing dressing changes with silver alginate and using a Tubigrip for edema control. He denies any significant pain. No claudication or rest pain. Ambulating per his baseline with a walker. No fever or chills. Moderate drainage. 10/10/15; the patient arrives  back today having not been seen in almost 2 months. He has no open wounds on his legs. He has a history of venous insufficiency, arterial insufficiency. He has chronic Coumadin use secondary to atrial fibrillation and a remote history of DVT. Fortunately as he has not been here in 2 months we were not able to order him stockings. He is not able to get on standard compression hose. Juxtalite stockings would cost him probably about $100 appear, the couple was not able to afford this. 11/11/17 READMISSION This is an [name] who is not a diabetic. He does have a history of sick sinus syndrome atrial fibrillation and has a cardiac pacemaker and is on Coumadin.he also has a history of DVT. His wife states that episodically over the last 2 years he has had wounds on his lower extremities but they have healed with home measures. Roughly 2-3 weeks ago the legs became a lot more swollen and he has had draining areas on the right anterior medial and lateral and the left medial leg. The patient's wife states that they were told to put support stockings on him but they are concerned because they worsen the blisters. this bit difficult to get the history but they think the edema has been  increasing over the last several months. 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 He does not complain of chest pain orthopnea. He does not have a history of kidney issues that he is aware. Primary doctor at [facility] He is not on diuretics. He was here 2 years ago. He was noted to have edema in his legs at that time. He had an ultrasound of his legs which I'll need to review. There was discussion about compression stockings but he is complaining clearly not using them. ABI in the right in this clinic was 1.37, 1.07 on the left 11/18/17; this is an elderly frail Potts who has bilateral small lower extremity wounds in the setting of very significant multifactorial edema. He probably has chronic venous insufficiency with lymphedema but it is clearly fluid overloaded last week. He apparently has started on diuretics through his primary physician at [facility] but the patient's wife is not sure which one but states that he goes to the bathroom every 15 minutes. He is tolerating compression wraps. His wife stated that they charged him full price stating that he didn't have open wounds on his legs that would cover wraparound stockings. His edema is much better controlled 11/25/17-he is here in follow-up evaluation for bilateral lower extremity ulcers. He is tolerating compression therapy. There was one episode since last appointment with copious amounts of drainage from the right medial malleolus. We will add additional absorptive dressing, otherwise no change in treatment plan and he will follow-up next week 12/02/17; since I last saw this Potts is edema control is a lot better and I think this is a combination of diuretic therapy and are 3 layer compression. He has severe bilateral venous insufficiency with hemosiderin deposition and likely chronic stasis dermatitis/inflammation. In spite of the edema control he has several weeping areas on both  legs including the left lateral leg, area on the right posterior leg. He also has an excoriated area just below his right tibial tuberosity. Fortuitously the patient has a follow-up with cardiology tomorrow 12/09/17; his edema control is excellent now on 40 mg of Lasix on his cardiologist last week and according to his wife was thoroughly evaluated including  checking his pacemaker and no medication changes are made. He has good edema control in his lower legs. He has significant chronic venous insufficiency with hemosiderin physician as well as stasis dermatitis. major areas on the medial malleoli Rathbun, Kyvon C. ([id number]) 12/16/17; he has excellent edema control however he is still having a lot of drainage and a lot of discomfort on the medial part of his right ankle/calf. The only other wound of any substances on the left medial malleolus. He is not been systemically unwell. 12/23/17; he has excellent edema control however he still has weeping edema fluid coming out of the medial part of the right ankle. The exact cause of this is unclear I gave him antibiotics last week however I don't really think that this is made a lot of difference. The other wound is on the left medial malleolus. This required debridement 12/30/17; he has good edema control bilaterally. Still having some weeping edema coming up the medial part of the right ankle. I suspect he has chronic venous inflammation/raises dermatitis. Some component of lymphedema 01/06/18; patient has very good edema control bilaterally. We've been using 3 alert compression which he seems to tolerate. I suspect he has chronic venous inflammation stasis dermatitis and some component of lymphedema. He had 3 areas we are following one on the right medial ankle, one on the left medial malleolus and one on the left anterior leg. Using silver alginate Electronic Signature(s) Signed: 01/06/2018 4:28:35 PM By: Baltazar Najjar MD Entered By: Baltazar Najjar  on 01/06/2018 16:23:12 [name] ([id number]) -------------------------------------------------------------------------------- Physical Exam Details Patient Name: Donald Musca C. Date of Service: 01/06/2018 2:30 PM Medical Record Number: [id number] Patient Account Number: [id number] Date of Birth/Sex: Jan 18, 1929 (84 y.o. M) Treating RN: Curtis Sites Primary Care Provider: Guerry Bruin Other Clinician: Referring Provider: Guerry Bruin Treating Provider/Extender: Altamese [place] in Treatment: 8 Constitutional Sitting or standing Blood Pressure is within target range for patient.. Pulse regular and within target range for patient.Marland Kitchen Respirations regular, non-labored and within target range.. Temperature is normal and within the target range for the patient.Marland Kitchen appears in no distress. Notes wound exam; the weeping edema in the right medial ankle as stopped. Tiny open area here covered with black eschar. I remove this with a #3 curet are along with some nonviable subcutaneous tissue. Hemostasis with direct pressure. oThe more substantial area over the left medial malleolus also required debridement surrounding eschar nonviable subcutaneous tissue over the surface of the wound hemostasis with direct pressure oThe area on the left anterior leg is closed Electronic Signature(s) Signed: 01/06/2018 4:28:35 PM By: Baltazar Najjar MD Entered By: Baltazar Najjar on 01/06/2018 16:24:38 [name] ([id number]) -------------------------------------------------------------------------------- Physician Orders Details Patient Name: Donald Musca C. Date of Service: 01/06/2018 2:30 PM Medical Record Number: [id number] Patient Account Number: [id number] Date of Birth/Sex: [birth year] (84 y.o. M) Treating RN: Curtis Sites Primary Care Provider: Guerry Bruin Other Clinician: Referring Provider: Guerry Bruin Treating Provider/Extender: Altamese [place] in  Treatment: 8 Verbal / Phone Orders: No Diagnosis Coding Wound Cleansing Wound #10 Right,Medial Malleolus o Cleanse wound with mild soap and water - HHRN please wash legs Wound #5 Left,Medial Malleolus o Cleanse wound with mild soap and water - HHRN please wash legs Anesthetic (add to Medication List) Wound #10 Right,Medial Malleolus o Topical Lidocaine 4% cream applied to wound bed prior to debridement (In Clinic Only). Wound #5 Left,Medial Malleolus o Topical Lidocaine 4% cream applied to wound bed prior to debridement (In Clinic Only). Primary  Wound Dressing Wound #10 Right,Medial Malleolus o Silver Collagen Wound #5 Left,Medial Malleolus o Silver Collagen Secondary Dressing Wound #10 Right,Medial Malleolus o Boardered Foam Dressing Wound #5 Left,Medial Malleolus o Boardered Foam Dressing Dressing Change Frequency Wound #10 Right,Medial Malleolus o Change Dressing Monday, Wednesday, Friday Wound #5 Left,Medial Malleolus o Change Dressing Monday, Wednesday, Friday Follow-up Appointments Wound #10 Right,Medial Malleolus o Return Appointment in 1 week. Wound #5 Left,Medial Malleolus o Return Appointment in 1 week. Edema Control NEDIM, OKI ([id number]) Wound #10 Right,Medial Malleolus o Patient to wear own Velcro compression garment. - HHRN to instruct in the roper usage of juxtalite compression wraps o Elevate legs to the level of the heart and pump ankles as often as possible Wound #5 Left,Medial Malleolus o Patient to wear own Velcro compression garment. - HHRN to instruct in the roper usage of juxtalite compression wraps o Elevate legs to the level of the heart and pump ankles as often as possible Additional Orders / Instructions Wound #10 Right,Medial Malleolus o Vitamin A; Vitamin C, Zinc o Activity as tolerated Wound #5 Left,Medial Malleolus o Vitamin A; Vitamin C, Zinc o Activity as tolerated Home Health Wound  #10 Right,Medial Malleolus o Continue Home Health Visits o Home Health Nurse may visit PRN to address patientos wound care needs. o FACE TO FACE ENCOUNTER: MEDICARE and MEDICAID PATIENTS: I certify that this patient is under my care and that I had a face-to-face encounter that meets the physician face-to-face encounter requirements with this patient on this date. The encounter with the patient was in whole or in part for the following MEDICAL CONDITION: (primary reason for Home Healthcare) MEDICAL NECESSITY: I certify, that based on my findings, NURSING services are a medically necessary home health service. HOME BOUND STATUS: I certify that my clinical findings support that this patient is homebound (i.e., Due to illness or injury, pt requires aid of supportive devices such as crutches, cane, wheelchairs, walkers, the use of special transportation or the assistance of another person to leave their place of residence. There is a normal inability to leave the home and doing so requires considerable and taxing effort. Other absences are for medical reasons / religious services and are infrequent or of short duration when for other reasons). o If current dressing causes regression in wound condition, may D/C ordered dressing product/s and apply Normal Saline Moist Dressing daily until next Wound Healing Center / Other MD appointment. Notify Wound Healing Center of regression in wound condition at [phone]. o Please direct any NON-WOUND related issues/requests for orders to patient's Primary Care Physician Wound #5 Left,Medial Malleolus o Continue Home Health Visits o Home Health Nurse may visit PRN to address patientos wound care needs. o FACE TO FACE ENCOUNTER: MEDICARE and MEDICAID PATIENTS: I certify that this patient is under my care and that I had a face-to-face encounter that meets the physician face-to-face encounter requirements with this patient on this date. The  encounter with the patient was in whole or in part for the following MEDICAL CONDITION: (primary reason for Home Healthcare) MEDICAL NECESSITY: I certify, that based on my findings, NURSING services are a medically necessary home health service. HOME BOUND STATUS: I certify that my clinical findings support that this patient is homebound (i.e., Due to illness or injury, pt requires aid of supportive devices such as crutches, cane, wheelchairs, walkers, the use of special transportation or the assistance of another person to leave their place of residence. There is a normal inability to leave the home  and doing so requires considerable and taxing effort. Other absences are for medical reasons / religious services and are infrequent or of short duration when for other reasons). o If current dressing causes regression in wound condition, may D/C ordered dressing product/s and apply Normal Saline Moist Dressing daily until next Wound Healing Center / Other MD appointment. Notify Wound Healing Center of regression in wound condition at [phone]. o Please direct any NON-WOUND related issues/requests for orders to patient's Primary Care Physician TREVAUN, RENDLEMAN ([id number]) Electronic Signature(s) Signed: 01/06/2018 4:28:35 PM By: Baltazar Najjar MD Signed: 01/06/2018 4:48:19 PM By: Curtis Sites Entered By: Curtis Sites on 01/06/2018 15:31:24 [name] ([id number]) -------------------------------------------------------------------------------- Problem List Details Patient Name: ORESTES, GEIMAN C. Date of Service: 01/06/2018 2:30 PM Medical Record Number: [id number] Patient Account Number: [id number] Date of Birth/Sex: 02/20/1929 (84 y.o. M) Treating RN: Curtis Sites Primary Care Provider: Guerry Bruin Other Clinician: Referring Provider: Guerry Bruin Treating Provider/Extender: Altamese [place] in Treatment: 8 Active Problems ICD-10 Impacting  Encounter Code Description Active Date Wound Healing Diagnosis L97.211 Non-pressure chronic ulcer of right calf limited to breakdown 11/11/2017 Yes of skin L97.221 Non-pressure chronic ulcer of left calf limited to breakdown of 11/11/2017 Yes skin I87.333 Chronic venous hypertension (idiopathic) with ulcer and 11/11/2017 Yes inflammation of bilateral lower extremity Inactive Problems Resolved Problems Electronic Signature(s) Signed: 01/06/2018 4:28:35 PM By: Baltazar Najjar MD Entered By: Baltazar Najjar on 01/06/2018 16:21:27 [name] ([id number]) -------------------------------------------------------------------------------- Progress Note Details Patient Name: Donald Musca C. Date of Service: 01/06/2018 2:30 PM Medical Record Number: [id number] Patient Account Number: [id number] Date of Birth/Sex: Dec 04, 1928 (84 y.o. M) Treating RN: Curtis Sites Primary Care Provider: Guerry Bruin Other Clinician: Referring Provider: Guerry Bruin Treating Provider/Extender: Altamese [place] in Treatment: 8 Subjective History of Present Illness (HPI) Pleasant 84 year old with history of chronic venous insufficiency, remote history of DVT, atrial fibrillation (on Coumadin). No history of diabetes. He says that he developed worsening left lower extremity swelling, blisters, and subsequent ulcerations in late October 2016. He was started on Augmentin for left lower extremity cellulitis, which has improved. Arterial ultrasound 07/27/2015 showed no significant peripheral arterial disease. Awaiting venous ultrasound. Performing dressing changes with silver alginate and using a Tubigrip for edema control. He denies any significant pain. No claudication or rest pain. Ambulating per his baseline with a walker. No fever or chills. Moderate drainage. 10/10/15; the patient arrives back today having not been seen in almost 2 months. He has no open wounds on his legs. He has a history of  venous insufficiency, arterial insufficiency. He has chronic Coumadin use secondary to atrial fibrillation and a remote history of DVT. Fortunately as he has not been here in 2 months we were not able to order him stockings. He is not able to get on standard compression hose. Juxtalite stockings would cost him probably about $100 appear, the couple was not able to afford this. 11/11/17 READMISSION This is an 84 year old Potts who is not a diabetic. He does have a history of sick sinus syndrome atrial fibrillation and has a cardiac pacemaker and is on Coumadin.he also has a history of DVT. His wife states that episodically over the last 2 years he has had wounds on his lower extremities but they have healed with home measures. Roughly 2-3 weeks ago the legs became a lot more swollen and he has had draining areas on the right anterior medial and lateral and the left medial leg. The patient's wife states that they were  told to put support stockings on him but they are concerned because they worsen the blisters. this bit difficult to get the history but they think the edema has been increasing over the last several months. 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 He does not complain of chest pain orthopnea. He does not have a history of kidney issues that he is aware. Primary doctor at [facility]. He is not on diuretics. He was here 2 years ago. He was noted to have edema in his legs at that time. He had an ultrasound of his legs which I'll need to review. There was discussion about compression stockings but he is complaining clearly not using them. ABI in the right in this clinic was 1.37, 1.07 on the left 11/18/17; this is an elderly frail Potts who has bilateral small lower extremity wounds in the setting of very significant multifactorial edema. He probably has chronic venous insufficiency with lymphedema but it is clearly fluid overloaded last week. He  apparently has started on diuretics through his primary physician at [facility] but the patient's wife is not sure which one but states that he goes to the bathroom every 15 minutes. He is tolerating compression wraps. His wife stated that they charged him full price stating that he didn't have open wounds on his legs that would cover wraparound stockings. His edema is much better controlled 11/25/17-he is here in follow-up evaluation for bilateral lower extremity ulcers. He is tolerating compression therapy. There was one episode since last appointment with copious amounts of drainage from the right medial malleolus. We will add additional absorptive dressing, otherwise no change in treatment plan and he will follow-up next week 12/02/17; since I last saw this Potts is edema control is a lot better and I think this is a combination of diuretic therapy and are 3 layer compression. He has severe bilateral venous insufficiency with hemosiderin deposition and likely chronic stasis dermatitis/inflammation. In spite of the edema control he has several weeping areas on both legs including the left lateral leg, area on the right posterior leg. He also has an excoriated area just below his right tibial tuberosity. Fortuitously the patient has a follow-up with cardiology tomorrow 12/09/17; his edema control is excellent now on 40 mg of Lasix on his cardiologist last week and according to his wife was thoroughly evaluated including checking his pacemaker and no medication changes are made. He has good edema control in his lower legs. He has significant chronic venous insufficiency with hemosiderin physician as well as stasis dermatitis. major DARRELD, HOFFER ([id number]) areas on the medial malleoli 12/16/17; he has excellent edema control however he is still having a lot of drainage and a lot of discomfort on the medial part of his right ankle/calf. The only other wound of any substances on the  left medial malleolus. He is not been systemically unwell. 12/23/17; he has excellent edema control however he still has weeping edema fluid coming out of the medial part of the right ankle. The exact cause of this is unclear I gave him antibiotics last week however I don't really think that this is made a lot of difference. The other wound is on the left medial malleolus. This required debridement 12/30/17; he has good edema control bilaterally. Still having some weeping edema coming up the medial part of the right ankle. I suspect he has chronic venous inflammation/raises dermatitis. Some component of lymphedema 01/06/18; patient has very  good edema control bilaterally. We've been using 3 alert compression which he seems to tolerate. I suspect he has chronic venous inflammation stasis dermatitis and some component of lymphedema. He had 3 areas we are following one on the right medial ankle, one on the left medial malleolus and one on the left anterior leg. Using silver alginate Objective Constitutional Sitting or standing Blood Pressure is within target range for patient.. Pulse regular and within target range for patient.Marland Kitchen Respirations regular, non-labored and within target range.. Temperature is normal and within the target range for the patient.Marland Kitchen appears in no distress. Vitals Time Taken: 3:00 PM, Height: 68 in, Weight: 150 lbs, BMI: 22.8, Temperature: 97.9 F, Pulse: 64 bpm, Respiratory Rate: 16 breaths/min, Blood Pressure: Donald/68 mmHg. General Notes: wound exam; the weeping edema in the right medial ankle as stopped. Tiny open area here covered with black eschar. I remove this with a #3 curet are along with some nonviable subcutaneous tissue. Hemostasis with direct pressure. The more substantial area over the left medial malleolus also required debridement surrounding eschar nonviable subcutaneous tissue over the surface of the wound hemostasis with direct pressure The area on the left  anterior leg is closed Integumentary (Hair, Skin) Wound #10 status is Open. Original cause of wound was Gradually Appeared. The wound is located on the Right,Medial Malleolus. The wound measures 0.3cm length x 0.4cm width x 0.1cm depth; 0.094cm^2 area and 0.009cm^3 volume. There is no tunneling or undermining noted. There is a none present amount of drainage noted. The wound margin is flat and intact. There is no granulation within the wound bed. There is a large (67-100%) amount of necrotic tissue within the wound bed including Eschar. Periwound temperature was noted as No Abnormality. The periwound has tenderness on palpation. Wound #13 status is Healed - Epithelialized. Original cause of wound was Gradually Appeared. The wound is located on the Left,Anterior Lower Leg. The wound measures 0cm length x 0cm width x 0cm depth; 0cm^2 area and 0cm^3 volume. There is no tunneling or undermining noted. There is a none present amount of drainage noted. The wound margin is flat and intact. There is no granulation within the wound bed. There is a large (67-100%) amount of necrotic tissue within the wound bed including Eschar. Periwound temperature was noted as No Abnormality. The periwound has tenderness on palpation. Wound #5 status is Open. Original cause of wound was Blister. The wound is located on the Left,Medial Malleolus. The wound measures 0.6cm length x 0.8cm width x 0.2cm depth; 0.377cm^2 area and 0.075cm^3 volume. There is no tunneling or undermining noted. There is a large amount of serosanguineous drainage noted. The wound margin is distinct with the outline attached to the wound base. There is no granulation within the wound bed. There is a large (67-100%) amount of necrotic tissue within the wound bed including Eschar and Adherent Slough. The periwound skin appearance did not exhibit: Maceration. Periwound temperature was noted as No Abnormality. The periwound has tenderness on  palpation. DEMETRY, BENDICKSON ([id number]) Assessment Active Problems ICD-10 [phone] - Non-pressure chronic ulcer of right calf limited to breakdown of skin L97.221 - Non-pressure chronic ulcer of left calf limited to breakdown of skin I87.333 - Chronic venous hypertension (idiopathic) with ulcer and inflammation of bilateral lower extremity Procedures Wound #10 Pre-procedure diagnosis of Wound #10 is a Venous Leg Ulcer located on the Right,Medial Malleolus .Severity of Tissue Pre Debridement is: Fat layer exposed. There was a Selective/Open Wound Non-Viable Tissue Debridement with a total area  of 0.12 sq cm performed by Maxwell Caul, MD. With the following instrument(s): Curette. to remove Non-Viable tissue/material Material removed includes Eschar after achieving pain control using Lidocaine 4% Topical Solution. No specimens were taken. A time out was conducted at 15:25, prior to the start of the procedure. A Minimum amount of bleeding was controlled with Pressure. The procedure was tolerated well with a pain level of 0 throughout and a pain level of 0 following the procedure. Post Debridement Measurements: 0.3cm length x 0.4cm width x 0.1cm depth; 0.009cm^3 volume. Character of Wound/Ulcer Post Debridement is improved. Severity of Tissue Post Debridement is: Fat layer exposed. Post procedure Diagnosis Wound #10: Same as Pre-Procedure Wound #5 Pre-procedure diagnosis of Wound #5 is a Venous Leg Ulcer located on the Left,Medial Malleolus .Severity of Tissue Pre Debridement is: Fat layer exposed. There was a Excisional Skin/Subcutaneous Tissue Debridement with a total area of 0.48 sq cm performed by Maxwell Caul, MD. With the following instrument(s): Curette. to remove Viable and Non-Viable tissue/material Material removed includes Eschar, Subcutaneous Tissue, and Slough, and Slough after achieving pain control using Lidocaine 4% Topical Solution. No specimens were taken. A time out  was conducted at 15:27, prior to the start of the procedure. A Minimum amount of bleeding was controlled with Pressure. The procedure was tolerated well with a pain level of 0 throughout and a pain level of 0 following the procedure. Post Debridement Measurements: 0.6cm length x 0.8cm width x 0.2cm depth; 0.075cm^3 volume. Character of Wound/Ulcer Post Debridement is improved. Severity of Tissue Post Debridement is: Fat layer exposed. Post procedure Diagnosis Wound #5: Same as Pre-Procedure Plan Wound Cleansing: Wound #10 Right,Medial Malleolus: Cleanse wound with mild soap and water - HHRN please wash legs Wound #5 Left,Medial Malleolus: Cleanse wound with mild soap and water - HHRN please wash legs Anesthetic (add to Medication List): Wound #10 Right,Medial Malleolus: Topical Lidocaine 4% cream applied to wound bed prior to debridement (In Clinic Only). Wound #5 Left,Medial Malleolus: [name], [name] ([id number]) Topical Lidocaine 4% cream applied to wound bed prior to debridement (In Clinic Only). Primary Wound Dressing: Wound #10 Right,Medial Malleolus: Silver Collagen Wound #5 Left,Medial Malleolus: Silver Collagen Secondary Dressing: Wound #10 Right,Medial Malleolus: Boardered Foam Dressing Wound #5 Left,Medial Malleolus: Boardered Foam Dressing Dressing Change Frequency: Wound #10 Right,Medial Malleolus: Change Dressing Monday, Wednesday, Friday Wound #5 Left,Medial Malleolus: Change Dressing Monday, Wednesday, Friday Follow-up Appointments: Wound #10 Right,Medial Malleolus: Return Appointment in 1 week. Wound #5 Left,Medial Malleolus: Return Appointment in 1 week. Edema Control: Wound #10 Right,Medial Malleolus: Patient to wear own Velcro compression garment. - HHRN to instruct in the roper usage of juxtalite compression wraps Elevate legs to the level of the heart and pump ankles as often as possible Wound #5 Left,Medial Malleolus: Patient to wear own Velcro  compression garment. - HHRN to instruct in the roper usage of juxtalite compression wraps Elevate legs to the level of the heart and pump ankles as often as possible Additional Orders / Instructions: Wound #10 Right,Medial Malleolus: Vitamin A; Vitamin C, Zinc Activity as tolerated Wound #5 Left,Medial Malleolus: Vitamin A; Vitamin C, Zinc Activity as tolerated Home Health: Wound #10 Right,Medial Malleolus: Continue Home Health Visits Home Health Nurse may visit PRN to address patient s wound care needs. FACE TO FACE ENCOUNTER: MEDICARE and MEDICAID PATIENTS: I certify that this patient is under my care and that I had a face-to-face encounter that meets the physician face-to-face encounter requirements with this patient on this date. The  encounter with the patient was in whole or in part for the following MEDICAL CONDITION: (primary reason for Home Healthcare) MEDICAL NECESSITY: I certify, that based on my findings, NURSING services are a medically necessary home health service. HOME BOUND STATUS: I certify that my clinical findings support that this patient is homebound (i.e., Due to illness or injury, pt requires aid of supportive devices such as crutches, cane, wheelchairs, walkers, the use of special transportation or the assistance of another person to leave their place of residence. There is a normal inability to leave the home and doing so requires considerable and taxing effort. Other absences are for medical reasons / religious services and are infrequent or of short duration when for other reasons). If current dressing causes regression in wound condition, may D/C ordered dressing product/s and apply Normal Saline Moist Dressing daily until next Wound Healing Center / Other MD appointment. Notify Wound Healing Center of regression in wound condition at [phone]. Please direct any NON-WOUND related issues/requests for orders to patient's Primary Care Physician Wound #5  Left,Medial Malleolus: Continue Home Health Visits Home Health Nurse may visit PRN to address patient s wound care needs. FACE TO FACE ENCOUNTER: MEDICARE and MEDICAID PATIENTS: I certify that this patient is under my care and that I had a face-to-face encounter that meets the physician face-to-face encounter requirements with this patient on this date. The encounter with the patient was in whole or in part for the following MEDICAL CONDITION: (primary reason for Home Healthcare) MEDICAL NECESSITY: I certify, that based on my findings, NURSING services are a medically necessary home health service. HOME BOUND STATUS: I certify that my clinical findings support that this patient is homebound (i.e., Due to [name], [name]. ([id number]) illness or injury, pt requires aid of supportive devices such as crutches, cane, wheelchairs, walkers, the use of special transportation or the assistance of another person to leave their place of residence. There is a normal inability to leave the home and doing so requires considerable and taxing effort. Other absences are for medical reasons / religious services and are infrequent or of short duration when for other reasons). If current dressing causes regression in wound condition, may D/C ordered dressing product/s and apply Normal Saline Moist Dressing daily until next Wound Healing Center / Other MD appointment. Notify Wound Healing Center of regression in wound condition at [phone]. Please direct any NON-WOUND related issues/requests for orders to patient's Primary Care Physician #1as the primary dressing to Silver collagen/border foam and put him in his own juxta leg stockings. #2 we have home health changing the dressings #3 will need to be careful about edema control here however. Electronic Signature(s) Signed: 01/06/2018 4:28:35 PM By: Baltazar Najjar MD Entered By: Baltazar Najjar on 01/06/2018 16:25:27 [name]  ([id number]) -------------------------------------------------------------------------------- SuperBill Details Patient Name: Donald Musca C. Date of Service: 01/06/2018 Medical Record Number: [id number] Patient Account Number: [id number] Date of Birth/Sex: May 09, 1929 (84 y.o. M) Treating RN: Curtis Sites Primary Care Provider: Guerry Bruin Other Clinician: Referring Provider: Guerry Bruin Treating Provider/Extender: Altamese Sanford in Treatment: 8 Diagnosis Coding ICD-10 Codes Code Description [id number] Non-pressure chronic ulcer of right calf limited to breakdown of skin L97.221 Non-pressure chronic ulcer of left calf limited to breakdown of skin I87.333 Chronic venous hypertension (idiopathic) with ulcer and inflammation of bilateral lower extremity Facility Procedures CPT4 Code: [id number] Description: 11042 - DEB SUBQ TISSUE 20 SQ CM/< ICD-10 Diagnosis Description L97.221 Non-pressure chronic ulcer of left calf limited to breakdown o  Modifier: f skin Quantity: 1 CPT4 Code: [id number] Description: 97597 - DEBRIDE WOUND 1ST 20 SQ CM OR < ICD-10 Diagnosis Description L97.211 Non-pressure chronic ulcer of right calf limited to breakdown Modifier: 59 of skin Quantity: 1 Physician Procedures CPT4 Code: [id number] Description: 11042 - WC PHYS SUBQ TISS 20 SQ CM ICD-10 Diagnosis Description L97.221 Non-pressure chronic ulcer of left calf limited to breakdown o Modifier: f skin Quantity: 1 CPT4 Code: [id number] Description: 97597 - WC PHYS DEBR WO ANESTH 20 SQ CM ICD-10 Diagnosis Description L97.211 Non-pressure chronic ulcer of right calf limited to breakdown Modifier: 59 of skin Quantity: 1 Electronic Signature(s) Signed: 01/06/2018 4:28:35 PM By: Baltazar Najjar MD Entered By: Baltazar Najjar on 01/06/2018 16:26:45

## 2018-01-10 NOTE — Progress Notes (Signed)
LUISFERNANDO, BRIGHTWELL ([id number]) Visit Report for 01/06/2018 Arrival Information Details Patient Name: [name], [name]. Date of Service: 01/06/2018 2:30 PM Medical Record Number: [id number] Patient Account Number: [id number] Date of Birth/Sex: Dec 08, 1928 (84 y.o. M) Treating RN: Donald Crigler Primary Care [name]: [name] Other Clinician: Referring [name]: [name] Treating Wiletta Bermingham/Extender: Altamese [place] in Treatment: 8 Visit Information History Since Last Visit All ordered tests and consults were completed: No Patient Arrived: Wheel Chair Added or deleted any medications: No Arrival Time: 14:57 Any new allergies or adverse reactions: No Accompanied By: wife Had a fall or experienced change in No Transfer Assistance: EasyPivot Patient activities of daily living that may affect Lift risk of falls: Patient Identification Verified: Yes Signs or symptoms of abuse/neglect since last visito No Secondary Verification Process Yes Hospitalized since last visit: No Completed: Implantable device outside of the clinic excluding No Patient Requires Transmission-Based No cellular tissue based products placed in the center Precautions: since last visit: Patient Has Alerts: Yes Has Dressing in Place as Prescribed: Yes Patient Alerts: Patient on Blood Pain Present Now: No Thinner Warfarin Pacemaker Electronic Signature(s) Signed: 01/06/2018 4:21:17 PM By: Donald Crigler Entered By: Donald Crigler on 01/06/2018 14:58:12 [name] ([id number]) -------------------------------------------------------------------------------- Encounter Discharge Information Details Patient Name: Donald Musca C. Date of Service: 01/06/2018 2:30 PM Medical Record Number: [id number] Patient Account Number: [id number] Date of Birth/Sex: November 14, 1928 (84 y.o. M) Treating RN: Donald Crigler Primary Care Nakai Yard: [name] Other Clinician: Referring Janaysia Mcleroy:  [name] Treating Demarquez Ciolek/Extender: Altamese [place] in Treatment: 8 Encounter Discharge Information Items Discharge Pain Level: 0 Discharge Condition: Stable Ambulatory Status: Walker Discharge Destination: Home Transportation: Private Auto Schedule Follow-up Appointment: Yes Medication Reconciliation completed and No provided to Patient/Care [name]: Provided on Clinical Summary of Care: 01/06/2018 Form Type Recipient Paper Patient RW Electronic Signature(s) Signed: 01/07/2018 2:11:15 PM By: [name] Entered By: [name] on 01/06/2018 15:57:14 [name] ([id number]) -------------------------------------------------------------------------------- Lower Extremity Assessment Details Patient Name: Donald Musca C. Date of Service: 01/06/2018 2:30 PM Medical Record Number: [id number] Patient Account Number: [id number] Date of Birth/Sex: Jul 02, 1929 (84 y.o. M) Treating RN: Donald Crigler Primary Care Deandrew Hoecker: [name] Other Clinician: Referring [name]: [name] Treating [name]/Extender: Altamese [place] in Treatment: 8 Edema Assessment Assessed: [Left: No] [Right: No] [Left: Edema] [Right: :] Calf Left: Right: Point of Measurement: 33 cm From Medial Instep 30 cm 31 cm Ankle Left: Right: Point of Measurement: 12 cm From Medial Instep 22 cm 20.5 cm Vascular Assessment Pulses: Dorsalis Pedis Palpable: [Left:Yes] [Right:Yes] Posterior Tibial Extremity colors, hair growth, and conditions: Extremity Color: [Left:Hyperpigmented] [Right:Hyperpigmented] Temperature of Extremity: [Left:Warm] [Right:Warm] Capillary Refill: [Left:< 3 seconds] [Right:< 3 seconds] Toe Nail Assessment Left: Right: Thick: Yes Yes Discolored: Yes Yes Deformed: Yes Yes Improper Length and Hygiene: Yes Yes Electronic Signature(s) Signed: 01/06/2018 4:21:17 PM By: Donald Crigler Entered By: Donald Crigler on 01/06/2018  15:09:38 [name] ([id number]) -------------------------------------------------------------------------------- Multi Wound Chart Details Patient Name: Donald Musca C. Date of Service: 01/06/2018 2:30 PM Medical Record Number: [id number] Patient Account Number: [id number] Date of Birth/Sex: 05/10/1929 (84 y.o. M) Treating RN: [name] Primary Care Kanetra Ho: [name] Other Clinician: Referring Kelley Polinsky: [name] Treating [name]/Extender: Altamese  in Treatment: 8 Vital Signs Height(in): 68 Pulse(bpm): 64 Weight(lbs): 150 Blood Pressure(mmHg): 104/68 Body Mass Index(BMI): 23 Temperature(F): 97.9 Respiratory Rate 16 (breaths/min): Photos: [10:No Photos] [13:No Photos] [5:No Photos] Wound Location: [10:Right Malleolus - Medial] [13:Left, Anterior Lower Leg] [5:Left Malleolus - Medial]  Wounding Event: [10:Gradually Appeared] [13:Gradually Appeared] [5:Blister] Primary Etiology: [10:Venous Leg Ulcer] [13:Venous Leg Ulcer] [5:Venous Leg Ulcer] Comorbid History: [10:Cataracts, Arrhythmia, Deep Vein Thrombosis, Osteoarthritis] [13:Cataracts, Arrhythmia, Deep Vein Thrombosis, Osteoarthritis] [5:Cataracts, Arrhythmia, Deep Vein Thrombosis, Osteoarthritis] Date Acquired: [10:12/02/2017] [13:12/09/2017] [5:10/28/2017] Weeks of Treatment: [10:5] [13:4] [5:8] Wound Status: [10:Open] [13:Healed - Epithelialized] [5:Open] Measurements L x W x D [10:0.3x0.4x0.1] [13:0x0x0] [5:0.6x0.8x0.2] (cm) Area (cm) : [10:0.094] [13:0] [5:0.377] Volume (cm) : [10:0.009] [13:0] [5:0.075] % Reduction in Area: [10:94.80%] [13:100.00%] [5:-59.70%] % Reduction in Volume: [10:95.00%] [13:100.00%] [5:-212.50%] Classification: [10:Full Thickness Without Exposed Support Structures] [13:Full Thickness Without Exposed Support Structures] [5:Full Thickness Without Exposed Support Structures] Exudate Amount: [10:None Present] [13:None Present] [5:Large] Exudate Type:  [10:N/A] [13:N/A] [5:Serosanguineous] Exudate Color: [10:N/A] [13:N/A] [5:red, brown] Wound Margin: [10:Flat and Intact] [13:Flat and Intact] [5:Distinct, outline attached] Granulation Amount: [10:None Present (0%)] [13:None Present (0%)] [5:None Present (0%)] Necrotic Amount: [10:Large (67-100%)] [13:Large (67-100%)] [5:Large (67-100%)] Necrotic Tissue: [10:Eschar] [13:Eschar] [5:Eschar, Adherent Slough] Epithelialization: [10:None] [13:None] [5:None] Debridement: [10:Debridement - Selective/Open Wound] [13:N/A] [5:Debridement - Excisional] Pre-procedure [10:15:25] [13:N/A] [5:15:27] Verification/Time Out Taken: Pain Control: [10:Lidocaine 4% Topical Solution N/A] [5:Lidocaine 4% Topical Solution] Tissue Debrided: [10:Necrotic/Eschar] [13:N/A] [5:Necrotic/Eschar, Subcutaneous, Slough] Level: [10:Non-Viable Tissue] [13:N/A] [5:Skin/Subcutaneous Tissue] Debridement Area (sq cm): [10:0.12] [13:N/A] [5:0.48] Instrument: [10:Curette] [13:N/A] [5:Curette] Bleeding: [10:Minimum] [13:N/A] [5:Minimum] Hemostasis Achieved: Pressure N/A Pressure Procedural Pain: 0 N/A 0 Post Procedural Pain: 0 N/A 0 Debridement Treatment Procedure was tolerated well N/A Procedure was tolerated well Response: Post Debridement 0.3x0.4x0.1 N/A 0.6x0.8x0.2 Measurements L x W x D (cm) Post Debridement Volume: 0.009 N/A 0.075 (cm) Periwound Skin Texture: No Abnormalities Noted No Abnormalities Noted No Abnormalities Noted Periwound Skin Moisture: No Abnormalities Noted No Abnormalities Noted Maceration: No Periwound Skin Color: No Abnormalities Noted No Abnormalities Noted No Abnormalities Noted Temperature: No Abnormality No Abnormality No Abnormality Tenderness on Palpation: Yes Yes Yes Wound Preparation: Ulcer Cleansing: Ulcer Cleansing: Ulcer Cleansing: Rinsed/Irrigated with Saline, Rinsed/Irrigated with Saline, Rinsed/Irrigated with Saline, Other: soap and water Other: soap and water Other: soap and  water Topical Anesthetic Applied: Topical Anesthetic Applied: Topical Anesthetic Applied: Other: lidocaine 4% Other: lidocaine 4% Other: lidocaine 4% Procedures Performed: Debridement N/A Debridement Treatment Notes Wound #10 (Right, Medial Malleolus) 1. Cleansed with: Clean wound with Normal Saline 2. Anesthetic Topical Lidocaine 4% cream to wound bed prior to debridement 4. Dressing Applied: Prisma Ag 5. Secondary Dressing Applied Bordered Foam Dressing 7. Secured with Support Garment 20-30 mm/Hg pressure to: Notes tea hing reviewex with patient anx his wife on juzo wraps for legs bilateral Wound #5 (Left, Medial Malleolus) 1. Cleansed with: Clean wound with Normal Saline 2. Anesthetic Topical Lidocaine 4% cream to wound bed prior to debridement 4. Dressing Applied: Prisma Ag 5. Secondary Dressing Applied Bordered Foam Dressing 7. Secured with Support Garment 20-30 mm/Hg pressure to: Notes tea hing reviewex with patient anx his wife on juzo wraps for legs bilateral LAN, MCNEILL ([id number]) Electronic Signature(s) Signed: 01/06/2018 4:28:35 PM By: Baltazar Najjar MD Entered By: Baltazar Najjar on 01/06/2018 16:21:38 [name] ([id number]) -------------------------------------------------------------------------------- Multi-Disciplinary Care Plan Details Patient Name: Donald Musca C. Date of Service: 01/06/2018 2:30 PM Medical Record Number: [id number] Patient Account Number: [id number] Date of Birth/Sex: Apr 17, 1929 (84 y.o. M) Treating RN: [name] Primary Care Vernon Ariel: [name] Other Clinician: Referring Lavayah Vita: [name] Treating Emmette Katt/Extender: Altamese [place] in Treatment: 8 Active Inactive ` Abuse / Safety / Falls / Self Care Management Nursing Diagnoses: Impaired physical mobility Potential for falls Goals: Patient  will not develop complications from immobility Date Initiated: 11/11/2017 Target  Resolution Date: 12/09/2017 Goal Status: Active Patient will remain injury free related to falls Date Initiated: 11/11/2017 Target Resolution Date: 12/09/2017 Goal Status: Active Interventions: Assess fall risk on admission and as needed Notes: ` Nutrition Nursing Diagnoses: Potential for alteratiion in Nutrition/Potential for imbalanced nutrition Goals: Patient/caregiver agrees to and verbalizes understanding of need to use nutritional supplements and/or vitamins as prescribed Date Initiated: 11/11/2017 Target Resolution Date: 12/10/2017 Goal Status: Active Interventions: Assess patient nutrition upon admission and as needed per policy Notes: ` Orientation to the Wound Care Program Nursing Diagnoses: Knowledge deficit related to the wound healing center program Goals: Patient/caregiver will verbalize understanding of the [facility]aling [facility] [address] MELITON, SAMAD ([id number]) Date Initiated: 11/11/2017 Target Resolution Date: 12/10/2017 Goal Status: Active Interventions: Provide education on orientation to the wound center Notes: ` Venous Leg Ulcer Nursing Diagnoses: Actual venous Insuffiency (use after diagnosis is confirmed) Goals: Non-invasive venous studies are completed as ordered Date Initiated: 11/11/2017 Target Resolution Date: 12/10/2017 Goal Status: Active Interventions: Assess peripheral edema status every visit. Treatment Activities: Non-invasive vascular studies : 11/11/2017 Notes: ` Wound/Skin Impairment Nursing Diagnoses: Impaired tissue integrity Goals: Ulcer/skin breakdown will have a volume reduction of 80% by week 12 Date Initiated: 11/11/2017 Target Resolution Date: 12/10/2017 Goal Status: Active Interventions: Assess patient/caregiver ability to obtain necessary supplies Provide education on ulcer and skin care Treatment Activities: Topical wound management initiated : 11/11/2017 Notes: Electronic Signature(s) Signed: 01/06/2018 4:48:19 PM By: [name] Entered By: [name] on 01/06/2018 15:25:56 [name] ([id number]) -------------------------------------------------------------------------------- Pain Assessment Details Patient Name: Donald Musca C. Date of Service: 01/06/2018 2:30 PM Medical Record Number: [id number] Patient Account Number: [id number] Date of Birth/Sex: 11-14-1928 (84 y.o. M) Treating RN: Donald Crigler Primary Care Ashten Sarnowski: [name] Other Clinician: Referring Donivin Wirt: [name] Treating Vinson Tietze/Extender: Altamese [place] in Treatment: 8 Active Problems Location of Pain Severity and Description of Pain Patient Has Paino No Site Locations Pain Management and Medication Current Pain Management: Electronic Signature(s) Signed: 01/06/2018 4:21:17 PM By: Donald Crigler Entered By: Donald Crigler on 01/06/2018 14:59:59 [name] ([id number]) -------------------------------------------------------------------------------- Patient/Caregiver Education Details Patient Name: [name]. Date of Service: 01/06/2018 2:30 PM Medical Record Number: [id number] Patient Account Number: [id number] Date of Birth/Gender: 20-Sep-1928 (84 y.o. M) Treating RN: Donald Crigler Primary Care Physician: [name] Other Clinician: Referring Physician: Guerry Potts Treating Physician/Extender: Altamese [place] in Treatment: 8 Education Assessment Education Provided To: Patient and Caregiver Education Topics Provided Wound Debridement: Methods: Explain/Verbal Responses: State content correctly Wound/Skin Impairment: Methods: Explain/Verbal Responses: State content correctly Electronic Signature(s) Signed: 01/06/2018 4:21:17 PM By: Donald Crigler Entered By: Donald Crigler on 01/06/2018 15:56:22 Evon, Annabell Potts ([id number]) -------------------------------------------------------------------------------- Wound Assessment Details Patient Name:  Donald Musca C. Date of Service: 01/06/2018 2:30 PM Medical Record Number: [id number] Patient Account Number: [id number] Date of Birth/Sex: [birth year] (84 y.o. M) Treating RN: Donald Crigler Primary Care Davinity Fanara: [name] Other Clinician: Referring Ellise Kovack: [name] Treating Kyle Luppino/Extender: Altamese [place] in Treatment: 8 Wound Status Wound Number: 10 Primary Venous Leg Ulcer Etiology: Wound Location: Right Malleolus - Medial Wound Status: Open Wounding Event: Gradually Appeared Comorbid Cataracts, Arrhythmia, Deep Vein Date Acquired: 12/02/2017 History: Thrombosis, Osteoarthritis Weeks Of Treatment: 5 Clustered Wound: No Photos Photo Uploaded By: Alejandro Mulling on 01/06/2018 16:25:37 Wound Measurements Length: (cm) 0.3 Width: (cm) 0.4 Depth: (cm) 0.1 Area: (cm) 0.094 Volume: (cm) 0.009 % Reduction in Area: 94.8% % Reduction in  Volume: 95% Epithelialization: None Tunneling: No Undermining: No Wound Description Full Thickness Without Exposed Support Classification: Structures Wound Margin: Flat and Intact Exudate None Present Amount: Foul Odor After Cleansing: No Slough/Fibrino Yes Wound Bed Granulation Amount: None Present (0%) Necrotic Amount: Large (67-100%) Necrotic Quality: Eschar Periwound Skin Texture Texture Color No Abnormalities Noted: No No Abnormalities Noted: No Moisture Temperature / Pain No Abnormalities Noted: No Temperature: No Abnormality Tenderness on Palpation: Yes Wound Preparation Ptacek, Cobain C. ([id number]) Ulcer Cleansing: Rinsed/Irrigated with Saline, Other: soap and water, Topical Anesthetic Applied: Other: lidocaine 4%, Treatment Notes Wound #10 (Right, Medial Malleolus) 1. Cleansed with: Clean wound with Normal Saline 2. Anesthetic Topical Lidocaine 4% cream to wound bed prior to debridement 4. Dressing Applied: Prisma Ag 5. Secondary Dressing Applied Bordered Foam Dressing 7.  Secured with Support Garment 20-30 mm/Hg pressure to: Notes tea hing reviewex with patient anx his wife on juzo wraps for legs bilateral Electronic Signature(s) Signed: 01/06/2018 4:21:17 PM By: Donald Crigler Entered By: Donald Crigler on 01/06/2018 15:11:46 [name] ([id number]) -------------------------------------------------------------------------------- Wound Assessment Details Patient Name: Donald Musca C. Date of Service: 01/06/2018 2:30 PM Medical Record Number: [id number] Patient Account Number: [id number] Date of Birth/Sex: 08/23/1929 (84 y.o. M) Treating RN: [name] Primary Care Mikeya Tomasetti: [name] Other Clinician: Referring Owais Pruett: [name] Treating Neilan Rizzo/Extender: Altamese [place] in Treatment: 8 Wound Status Wound Number: 13 Primary Venous Leg Ulcer Etiology: Wound Location: Left, Anterior Lower Leg Wound Status: Healed - Epithelialized Wounding Event: Gradually Appeared Comorbid Cataracts, Arrhythmia, Deep Vein Date Acquired: 12/09/2017 History: Thrombosis, Osteoarthritis Weeks Of Treatment: 4 Clustered Wound: No Photos Photo Uploaded By: Alejandro Mulling on 01/06/2018 16:26:15 Wound Measurements Length: (cm) 0 Width: (cm) 0 Depth: (cm) 0 Area: (cm) 0 Volume: (cm) 0 % Reduction in Area: 100% % Reduction in Volume: 100% Epithelialization: None Tunneling: No Undermining: No Wound Description Full Thickness Without Exposed Support Classification: Structures Wound Margin: Flat and Intact Exudate None Present Amount: Foul Odor After Cleansing: No Slough/Fibrino Yes Wound Bed Granulation Amount: None Present (0%) Exposed Structure Necrotic Amount: Large (67-100%) Fascia Exposed: No Necrotic Quality: Eschar Fat Layer (Subcutaneous Tissue) Exposed: No Tendon Exposed: No Muscle Exposed: No Joint Exposed: No Bone Exposed: No Periwound Skin Texture Texture Color No Abnormalities Noted: No No  Abnormalities Noted: No Moisture Temperature / Pain Cart, Jesper C. ([id number]) No Abnormalities Noted: No Temperature: No Abnormality Tenderness on Palpation: Yes Wound Preparation Ulcer Cleansing: Rinsed/Irrigated with Saline, Other: soap and water, Topical Anesthetic Applied: Other: lidocaine 4%, Electronic Signature(s) Signed: 01/06/2018 4:48:19 PM By: [name] Entered By: [name] on 01/06/2018 15:26:58 [name] ([id number]) -------------------------------------------------------------------------------- Wound Assessment Details Patient Name: Donald Musca C. Date of Service: 01/06/2018 2:30 PM Medical Record Number: [id number] Patient Account Number: [id number] Date of Birth/Sex: 29-Jun-1929 (84 y.o. M) Treating RN: Donald Crigler Primary Care Suzette Flagler: [name] Other Clinician: Referring Deshana Rominger: [name] Treating Latashia Koch/Extender: Altamese [place] in Treatment: 8 Wound Status Wound Number: 5 Primary Venous Leg Ulcer Etiology: Wound Location: Left Malleolus - Medial Wound Status: Open Wounding Event: Blister Comorbid Cataracts, Arrhythmia, Deep Vein Date Acquired: 10/28/2017 History: Thrombosis, Osteoarthritis Weeks Of Treatment: 8 Clustered Wound: No Photos Photo Uploaded By: Alejandro Mulling on 01/06/2018 16:26:15 Wound Measurements Length: (cm) 0.6 Width: (cm) 0.8 Depth: (cm) 0.2 Area: (cm) 0.377 Volume: (cm) 0.075 % Reduction in Area: -59.7% % Reduction in Volume: -212.5% Epithelialization: None Tunneling: No Undermining: No Wound Description Full Thickness Without Exposed Support Classification: Structures Wound Margin: Distinct, outline  attached Exudate Large Amount: Exudate Type: Serosanguineous Exudate Color: red, brown Foul Odor After Cleansing: No Slough/Fibrino Yes Wound Bed Granulation Amount: None Present (0%) Exposed Structure Necrotic Amount: Large (67-100%) Fascia Exposed:  No Necrotic Quality: Eschar, Adherent Slough Fat Layer (Subcutaneous Tissue) Exposed: No Tendon Exposed: No Muscle Exposed: No Joint Exposed: No Bone Exposed: No Periwound Skin Texture Texture Color Slaugh, Zane C. ([id number]) No Abnormalities Noted: No No Abnormalities Noted: No Moisture Temperature / Pain No Abnormalities Noted: No Temperature: No Abnormality Maceration: No Tenderness on Palpation: Yes Wound Preparation Ulcer Cleansing: Rinsed/Irrigated with Saline, Other: soap and water, Topical Anesthetic Applied: Other: lidocaine 4%, Treatment Notes Wound #5 (Left, Medial Malleolus) 1. Cleansed with: Clean wound with Normal Saline 2. Anesthetic Topical Lidocaine 4% cream to wound bed prior to debridement 4. Dressing Applied: Prisma Ag 5. Secondary Dressing Applied Bordered Foam Dressing 7. Secured with Support Garment 20-30 mm/Hg pressure to: Notes tea hing reviewex with patient anx his wife on juzo wraps for legs bilateral Electronic Signature(s) Signed: 01/06/2018 4:21:17 PM By: Donald Crigler Entered By: Donald Crigler on 01/06/2018 15:14:35 [name] ([id number]) -------------------------------------------------------------------------------- Vitals Details Patient Name: Donald Musca C. Date of Service: 01/06/2018 2:30 PM Medical Record Number: [id number] Patient Account Number: [id number] Date of Birth/Sex: 03/27/1929 (84 y.o. M) Treating RN: Donald Crigler Primary Care Aziah Kaiser: [name] Other Clinician: Referring Aariz Maish: [name] Treating Yaniris Braddock/Extender: Altamese [place] in Treatment: 8 Vital Signs Time Taken: 15:00 Temperature (F): 97.9 Height (in): 68 Pulse (bpm): 64 Weight (lbs): 150 Respiratory Rate (breaths/min): 16 Body Mass Index (BMI): 22.8 Blood Pressure (mmHg): 104/68 Reference Range: 80 - 120 mg / dl Electronic Signature(s) Signed: 01/06/2018 4:21:17 PM By: Donald Crigler Entered By:  Donald Crigler on 01/06/2018 15:00:58

## 2018-01-11 DIAGNOSIS — L97221 Non-pressure chronic ulcer of left calf limited to breakdown of skin: Secondary | ICD-10-CM | POA: Diagnosis not present

## 2018-01-11 DIAGNOSIS — I87333 Chronic venous hypertension (idiopathic) with ulcer and inflammation of bilateral lower extremity: Secondary | ICD-10-CM | POA: Diagnosis not present

## 2018-01-11 DIAGNOSIS — L97211 Non-pressure chronic ulcer of right calf limited to breakdown of skin: Secondary | ICD-10-CM | POA: Diagnosis not present

## 2018-01-11 DIAGNOSIS — I4891 Unspecified atrial fibrillation: Secondary | ICD-10-CM | POA: Diagnosis not present

## 2018-01-11 DIAGNOSIS — I10 Essential (primary) hypertension: Secondary | ICD-10-CM | POA: Diagnosis not present

## 2018-01-11 DIAGNOSIS — L97321 Non-pressure chronic ulcer of left ankle limited to breakdown of skin: Secondary | ICD-10-CM | POA: Diagnosis not present

## 2018-01-12 DIAGNOSIS — I87333 Chronic venous hypertension (idiopathic) with ulcer and inflammation of bilateral lower extremity: Secondary | ICD-10-CM | POA: Diagnosis not present

## 2018-01-12 DIAGNOSIS — I10 Essential (primary) hypertension: Secondary | ICD-10-CM | POA: Diagnosis not present

## 2018-01-12 DIAGNOSIS — Z86718 Personal history of other venous thrombosis and embolism: Secondary | ICD-10-CM | POA: Diagnosis not present

## 2018-01-12 DIAGNOSIS — L97211 Non-pressure chronic ulcer of right calf limited to breakdown of skin: Secondary | ICD-10-CM | POA: Diagnosis not present

## 2018-01-12 DIAGNOSIS — Z7901 Long term (current) use of anticoagulants: Secondary | ICD-10-CM | POA: Diagnosis not present

## 2018-01-12 DIAGNOSIS — I8290 Acute embolism and thrombosis of unspecified vein: Secondary | ICD-10-CM | POA: Diagnosis not present

## 2018-01-12 DIAGNOSIS — I4891 Unspecified atrial fibrillation: Secondary | ICD-10-CM | POA: Diagnosis not present

## 2018-01-12 DIAGNOSIS — I482 Chronic atrial fibrillation: Secondary | ICD-10-CM | POA: Diagnosis not present

## 2018-01-12 DIAGNOSIS — L97321 Non-pressure chronic ulcer of left ankle limited to breakdown of skin: Secondary | ICD-10-CM | POA: Diagnosis not present

## 2018-01-12 DIAGNOSIS — M1990 Unspecified osteoarthritis, unspecified site: Secondary | ICD-10-CM | POA: Diagnosis not present

## 2018-01-13 ENCOUNTER — Encounter: Payer: Medicare Other | Admitting: Internal Medicine

## 2018-01-13 DIAGNOSIS — L97319 Non-pressure chronic ulcer of right ankle with unspecified severity: Secondary | ICD-10-CM | POA: Diagnosis not present

## 2018-01-13 DIAGNOSIS — I4891 Unspecified atrial fibrillation: Secondary | ICD-10-CM | POA: Diagnosis not present

## 2018-01-13 DIAGNOSIS — L97222 Non-pressure chronic ulcer of left calf with fat layer exposed: Secondary | ICD-10-CM | POA: Diagnosis not present

## 2018-01-13 DIAGNOSIS — Z86718 Personal history of other venous thrombosis and embolism: Secondary | ICD-10-CM | POA: Diagnosis not present

## 2018-01-13 DIAGNOSIS — Z7901 Long term (current) use of anticoagulants: Secondary | ICD-10-CM | POA: Diagnosis not present

## 2018-01-13 DIAGNOSIS — R600 Localized edema: Secondary | ICD-10-CM | POA: Diagnosis not present

## 2018-01-13 DIAGNOSIS — L97329 Non-pressure chronic ulcer of left ankle with unspecified severity: Secondary | ICD-10-CM | POA: Diagnosis not present

## 2018-01-13 DIAGNOSIS — Z09 Encounter for follow-up examination after completed treatment for conditions other than malignant neoplasm: Secondary | ICD-10-CM | POA: Diagnosis not present

## 2018-01-13 DIAGNOSIS — R609 Edema, unspecified: Secondary | ICD-10-CM | POA: Diagnosis not present

## 2018-01-13 DIAGNOSIS — I872 Venous insufficiency (chronic) (peripheral): Secondary | ICD-10-CM | POA: Diagnosis not present

## 2018-01-15 DIAGNOSIS — L97321 Non-pressure chronic ulcer of left ankle limited to breakdown of skin: Secondary | ICD-10-CM | POA: Diagnosis not present

## 2018-01-15 DIAGNOSIS — L97211 Non-pressure chronic ulcer of right calf limited to breakdown of skin: Secondary | ICD-10-CM | POA: Diagnosis not present

## 2018-01-15 DIAGNOSIS — M1990 Unspecified osteoarthritis, unspecified site: Secondary | ICD-10-CM | POA: Diagnosis not present

## 2018-01-15 DIAGNOSIS — I10 Essential (primary) hypertension: Secondary | ICD-10-CM | POA: Diagnosis not present

## 2018-01-15 DIAGNOSIS — I87333 Chronic venous hypertension (idiopathic) with ulcer and inflammation of bilateral lower extremity: Secondary | ICD-10-CM | POA: Diagnosis not present

## 2018-01-15 DIAGNOSIS — I4891 Unspecified atrial fibrillation: Secondary | ICD-10-CM | POA: Diagnosis not present

## 2018-01-16 NOTE — Progress Notes (Signed)
[name] ([id number]) Visit Report for 01/13/2018 HPI Details Patient Name: [name], [name]. Date of Service: 01/13/2018 3:45 PM Medical Record Number: [id number] Patient Account Number: [id number] Date of Birth/Sex: 05-23-1929 (84 y.o. M) Treating RN: Huel Coventry Primary Care Provider: Guerry Bruin Other Clinician: Referring Provider: Guerry Bruin Treating Provider/Extender: Altamese [place] in Treatment: 9 History of Present Illness HPI Description: Pleasant 84 year old with history of chronic venous insufficiency, remote history of DVT, atrial fibrillation (on Coumadin). No history of diabetes. He says that he developed worsening left lower extremity swelling, blisters, and subsequent ulcerations in late October 2016. He was started on Augmentin for left lower extremity cellulitis, which has improved. Arterial ultrasound 07/27/2015 showed no significant peripheral arterial disease. Awaiting venous ultrasound. Performing dressing changes with silver alginate and using a Tubigrip for edema control. He denies any significant pain. No claudication or rest pain. Ambulating per his baseline with a walker. No fever or chills. Moderate drainage. 10/10/15; the patient arrives back today having not been seen in almost 2 months. He has no open wounds on his legs. He has a history of venous insufficiency, arterial insufficiency. He has chronic Coumadin use secondary to atrial fibrillation and a remote history of DVT. Fortunately as he has not been here in 2 months we were not able to order him stockings. He is not able to get on standard compression hose. Juxtalite stockings would cost him probably about $100 appear, the couple was not able to afford this. 11/11/17 READMISSION This is an 84 year old man who is not a diabetic. He does have a history of sick sinus syndrome atrial fibrillation and has a cardiac pacemaker and is on Coumadin.he also has a history of DVT. His wife  states that episodically over the last 2 years he has had wounds on his lower extremities but they have healed with home measures. Roughly 2-3 weeks ago the legs became a lot more swollen and he has had draining areas on the right anterior medial and lateral and the left medial leg. The patient's wife states that they were told to put support stockings on him but they are concerned because they worsen the blisters. this bit difficult to get the history but they think the edema has been increasing over the last several months. 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 He does not complain of chest pain orthopnea. He does not have a history of kidney issues that he is aware. Primary doctor at [facility]. He is not on diuretics. He was here 2 years ago. He was noted to have edema in his legs at that time. He had an ultrasound of his legs which I'll need to review. There was discussion about compression stockings but he is complaining clearly not using them. ABI in the right in this clinic was 1.37, 1.07 on the left 11/18/17; this is an elderly frail man who has bilateral small lower extremity wounds in the setting of very significant multifactorial edema. He probably has chronic venous insufficiency with lymphedema but it is clearly fluid overloaded last week. He apparently has started on diuretics through his primary physician at [facility] but the patient's wife is not sure which one but states that he goes to the bathroom every 15 minutes. He is tolerating compression wraps. His wife stated that they charged him full price stating that he didn't have open wounds on his legs that would cover wraparound stockings. His edema is much  better controlled 11/25/17-he is here in follow-up evaluation for bilateral lower extremity ulcers. He is tolerating compression therapy. There was one episode since last appointment with copious amounts of drainage  from the right medial malleolus. We will add additional absorptive dressing, otherwise no change in treatment plan and he will follow-up next week 12/02/17; since I last saw this man is edema control is a lot better and I think this is a combination of diuretic therapy and are 3 layer compression. He has severe bilateral venous insufficiency with hemosiderin deposition and likely chronic stasis dermatitis/inflammation. In spite of the edema control he has several weeping areas on both legs including the left lateral leg, area on the right posterior leg. He also has an excoriated area just below his right tibial tuberosity. Fortuitously the patient has a follow-up with cardiology tomorrow 12/09/17; his edema control is excellent now on 40 mg of Lasix on his cardiologist last week and according to his wife was [name] ([id number]) thoroughly evaluated including checking his pacemaker and no medication changes are made. He has good edema control in his lower legs. He has significant chronic venous insufficiency with hemosiderin physician as well as stasis dermatitis. major areas on the medial malleoli 12/16/17; he has excellent edema control however he is still having a lot of drainage and a lot of discomfort on the medial part of his right ankle/calf. The only other wound of any substances on the left medial malleolus. He is not been systemically unwell. 12/23/17; he has excellent edema control however he still has weeping edema fluid coming out of the medial part of the right ankle. The exact cause of this is unclear I gave him antibiotics last week however I don't really think that this is made a lot of difference. The other wound is on the left medial malleolus. This required debridement 12/30/17; he has good edema control bilaterally. Still having some weeping edema coming up the medial part of the right ankle. I suspect he has chronic venous inflammation/raises dermatitis. Some component  of lymphedema 01/06/18; patient has very good edema control bilaterally. We've been using 3 alert compression which he seems to tolerate. I suspect he has chronic venous inflammation stasis dermatitis and some component of lymphedema. He had 3 areas we are following one on the right medial ankle, one on the left medial malleolus and one on the left anterior leg. Using silver alginate 01/13/18; patient we changed to silver collagen last week. He is been using his own juxta light stockings. He comes in today his edema is well controlled.. Still some areas on the left anterior leg but everything on the right medial ankle looks closed. His wife is asking questions about his Lasix how long he needs to be on this etc. etc. doesn't look like he's had a lot of follow-up on his electrolytes and I've ordered a basic metabolic panel Electronic Signature(s) Signed: 01/14/2018 1:16:01 PM By: Baltazar Najjar MD Entered By: Baltazar Najjar on 01/14/2018 07:50:06 [name] ([id number]) -------------------------------------------------------------------------------- Physical Exam Details Patient Name: Donald Musca C. Date of Service: 01/13/2018 3:45 PM Medical Record Number: [id number] Patient Account Number: [id number] Date of Birth/Sex: 09-21-1928 (84 y.o. M) Treating RN: Huel Coventry Primary Care Provider: Guerry Bruin Other Clinician: Referring Provider: Guerry Bruin Treating Provider/Extender: Altamese [place] in Treatment: 9 Constitutional Sitting or standing Blood Pressure is within target range for patient.. Pulse regular and within target range for patient.Marland Kitchen Respirations regular, non-labored and within target range.. Temperature is normal  and within the target range for the patient.Marland Kitchen appears in no distress.Not as fibrinous-looking as I am used to seeing. Respiratory Respiratory effort is easy and symmetric bilaterally. Rate is normal at rest and on room air.. Cardiovascular He  does not look overtly dehydrated. Pedal pulses palpable and strong bilaterally.. Edema is well-controlled. Lymphatic None palpable in the popliteal or inguinal area. Musculoskeletal Other than the severe hemosiderin deposition bilaterally no overt skin issues. Psychiatric Somewhat less talkative but in general no overt pathology. Notes Wound exam; there is no open area on the right medial ankle. There is no weeping edema fluid. He has Areas over the left anterior and left medial but these look better than last week. Severe chronic venous insufficiency with hemosiderin deposition. Electronic Signature(s) Signed: 01/14/2018 1:16:01 PM By: Baltazar Najjar MD Entered By: Baltazar Najjar on 01/14/2018 07:52:27 [name] ([id number]) -------------------------------------------------------------------------------- Physician Orders Details Patient Name: [name] Date of Service: 01/13/2018 3:45 PM Medical Record Number: [id number] Patient Account Number: [id number] Date of Birth/Sex: 06/16/1929 (84 y.o. M) Treating RN: Huel Coventry Primary Care Provider: Guerry Bruin Other Clinician: Referring Provider: Guerry Bruin Treating Provider/Extender: Altamese [place] in Treatment: 9 Verbal / Phone Orders: No Diagnosis Coding Wound Cleansing Wound #14 Left,Lateral Lower Leg o Cleanse wound with mild soap and water - HHRN please wash legs Wound #15 Left,Medial Lower Leg o Cleanse wound with mild soap and water - HHRN please wash legs Wound #5 Left,Medial Malleolus o Cleanse wound with mild soap and water - HHRN please wash legs Anesthetic (add to Medication List) Wound #14 Left,Lateral Lower Leg o Topical Lidocaine 4% cream applied to wound bed prior to debridement (In Clinic Only). Wound #15 Left,Medial Lower Leg o Topical Lidocaine 4% cream applied to wound bed prior to debridement (In Clinic Only). Wound #5 Left,Medial Malleolus o Topical Lidocaine 4%  cream applied to wound bed prior to debridement (In Clinic Only). Primary Wound Dressing Wound #14 Left,Lateral Lower Leg o Silver Collagen Wound #15 Left,Medial Lower Leg o Silver Collagen Wound #5 Left,Medial Malleolus o Silver Collagen Secondary Dressing Wound #14 Left,Lateral Lower Leg o ABD and Kerlix/Conform Wound #15 Left,Medial Lower Leg o ABD and Kerlix/Conform Wound #5 Left,Medial Malleolus o ABD and Kerlix/Conform Dressing Change Frequency Wound #14 Left,Lateral Lower Leg o Change Dressing Monday, Wednesday, Friday ARGELIO, GRANIER ([id number]) Wound #15 Left,Medial Lower Leg o Change Dressing Monday, Wednesday, Friday Wound #5 Left,Medial Malleolus o Change Dressing Monday, Wednesday, Friday Follow-up Appointments Wound #14 Left,Lateral Lower Leg o Return Appointment in 1 week. Wound #15 Left,Medial Lower Leg o Return Appointment in 1 week. Wound #5 Left,Medial Malleolus o Return Appointment in 1 week. Edema Control Wound #14 Left,Lateral Lower Leg o Patient to wear own Velcro compression garment. o Elevate legs to the level of the heart and pump ankles as often as possible Wound #15 Left,Medial Lower Leg o Patient to wear own Velcro compression garment. o Elevate legs to the level of the heart and pump ankles as often as possible Wound #5 Left,Medial Malleolus o Patient to wear own Velcro compression garment. o Elevate legs to the level of the heart and pump ankles as often as possible Additional Orders / Instructions Wound #14 Left,Lateral Lower Leg o Vitamin A; Vitamin C, Zinc o Activity as tolerated Wound #15 Left,Medial Lower Leg o Vitamin A; Vitamin C, Zinc o Activity as tolerated Wound #5 Left,Medial Malleolus o Vitamin A; Vitamin C, Zinc o Activity as tolerated Home Health Wound #14 Left,Lateral Lower Leg   o Continue Home Health Visits o Home Health Nurse may visit PRN to address patientos  wound care needs. o FACE TO FACE ENCOUNTER: MEDICARE and MEDICAID PATIENTS: I certify that this patient is under my care and that I had a face-to-face encounter that meets the physician face-to-face encounter requirements with this patient on this date. The encounter with the patient was in whole or in part for the following MEDICAL CONDITION: (primary reason for Home Healthcare) MEDICAL NECESSITY: I certify, that based on my findings, NURSING services are a medically necessary home health service. HOME BOUND STATUS: I certify that my clinical findings support that this patient is homebound (i.e., Due to illness or injury, pt requires aid of supportive devices such as crutches, cane, wheelchairs, walkers, the use of special transportation or the assistance of another person to leave their place of residence. There is a normal inability to leave the home [name], [name] ([id number]) and doing so requires considerable and taxing effort. Other absences are for medical reasons / religious services and are infrequent or of short duration when for other reasons). o If current dressing causes regression in wound condition, may D/C ordered dressing product/s and apply Normal Saline Moist Dressing daily until next Wound Healing Center / Other MD appointment. Notify Wound Healing Center of regression in wound condition at [phone]. o Please direct any NON-WOUND related issues/requests for orders to patient's Primary Care Physician Wound #15 Left,Medial Lower Leg o Continue Home Health Visits o Home Health Nurse may visit PRN to address patientos wound care needs. o FACE TO FACE ENCOUNTER: MEDICARE and MEDICAID PATIENTS: I certify that this patient is under my care and that I had a face-to-face encounter that meets the physician face-to-face encounter requirements with this patient on this date. The encounter with the patient was in whole or in part for the following MEDICAL CONDITION:  (primary reason for Home Healthcare) MEDICAL NECESSITY: I certify, that based on my findings, NURSING services are a medically necessary home health service. HOME BOUND STATUS: I certify that my clinical findings support that this patient is homebound (i.e., Due to illness or injury, pt requires aid of supportive devices such as crutches, cane, wheelchairs, walkers, the use of special transportation or the assistance of another person to leave their place of residence. There is a normal inability to leave the home and doing so requires considerable and taxing effort. Other absences are for medical reasons / religious services and are infrequent or of short duration when for other reasons). o If current dressing causes regression in wound condition, may D/C ordered dressing product/s and apply Normal Saline Moist Dressing daily until next Wound Healing Center / Other MD appointment. Notify Wound Healing Center of regression in wound condition at [phone]. o Please direct any NON-WOUND related issues/requests for orders to patient's Primary Care Physician Wound #5 Left,Medial Malleolus o Continue Home Health Visits o Home Health Nurse may visit PRN to address patientos wound care needs. o FACE TO FACE ENCOUNTER: MEDICARE and MEDICAID PATIENTS: I certify that this patient is under my care and that I had a face-to-face encounter that meets the physician face-to-face encounter requirements with this patient on this date. The encounter with the patient was in whole or in part for the following MEDICAL CONDITION: (primary reason for Home Healthcare) MEDICAL NECESSITY: I certify, that based on my findings, NURSING services are a medically necessary home health service. HOME BOUND STATUS: I certify that my clinical findings support that this patient is homebound (i.e.,  Due to illness or injury, pt requires aid of supportive devices such as crutches, cane, wheelchairs, walkers, the use of  special transportation or the assistance of another person to leave their place of residence. There is a normal inability to leave the home and doing so requires considerable and taxing effort. Other absences are for medical reasons / religious services and are infrequent or of short duration when for other reasons). o If current dressing causes regression in wound condition, may D/C ordered dressing product/s and apply Normal Saline Moist Dressing daily until next Wound Healing Center / Other MD appointment. Notify Wound Healing Center of regression in wound condition at [phone]. o Please direct any NON-WOUND related issues/requests for orders to patient's Primary Care Physician Custom [employer] o BMP - Drawn by [facility]. Please fax results to [phone]. Electronic Signature(s) Signed: 01/13/2018 5:47:31 PM By: Elliot Gurney, [name], RN, CWS, [name], [name] Signed: 01/14/2018 1:16:01 PM By: Baltazar Najjar MD Entered By: Elliot Gurney, [name], RN, CWS, Kim on 01/13/2018 16:19:00 [name] ([id number]) -------------------------------------------------------------------------------- Problem List Details Patient Name: CLOUD, GRAHAM. Date of Service: 01/13/2018 3:45 PM Medical Record Number: [id number] Patient Account Number: [id number] Date of Birth/Sex: [birth year] (84 y.o. M) Treating RN: Huel Coventry Primary Care Provider: Guerry Bruin Other Clinician: Referring Provider: Guerry Bruin Treating Provider/Extender: Altamese [place] in Treatment: 9 Active Problems ICD-10 Impacting Encounter Code Description Active Date Wound Healing Diagnosis L97.211 Non-pressure chronic ulcer of right calf limited to breakdown 11/11/2017 Yes of skin L97.221 Non-pressure chronic ulcer of left calf limited to breakdown of 11/11/2017 Yes skin I87.333 Chronic venous hypertension (idiopathic) with ulcer and 11/11/2017 Yes inflammation of bilateral lower extremity Inactive Problems Resolved  Problems Electronic Signature(s) Signed: 01/14/2018 1:16:01 PM By: Baltazar Najjar MD Entered By: Baltazar Najjar on 01/14/2018 07:47:04 [name], [name] [place] ([id number]) -------------------------------------------------------------------------------- Progress Note Details Patient Name: Donald Musca C. Date of Service: 01/13/2018 3:45 PM Medical Record Number: [id number] Patient Account Number: [id number] Date of Birth/Sex: Feb 14, 1929 (84 y.o. M) Treating RN: Huel Coventry Primary Care Provider: Guerry Bruin Other Clinician: Referring Provider: Guerry Bruin Treating Provider/Extender: Altamese [place] in Treatment: 9 Subjective History of Present Illness (HPI) Pleasant 84 year old with history of chronic venous insufficiency, remote history of DVT, atrial fibrillation (on Coumadin). No history of diabetes. He says that he developed worsening left lower extremity swelling, blisters, and subsequent ulcerations in late October 2016. He was started on Augmentin for left lower extremity cellulitis, which has improved. Arterial ultrasound 07/27/2015 showed no significant peripheral arterial disease. Awaiting venous ultrasound. Performing dressing changes with silver alginate and using a Tubigrip for edema control. He denies any significant pain. No claudication or rest pain. Ambulating per his baseline with a walker. No fever or chills. Moderate drainage. 10/10/15; the patient arrives back today having not been seen in almost 2 months. He has no open wounds on his legs. He has a history of venous insufficiency, arterial insufficiency. He has chronic Coumadin use secondary to atrial fibrillation and a remote history of DVT. Fortunately as he has not been here in 2 months we were not able to order him stockings. He is not able to get on standard compression hose. Juxtalite stockings would cost him probably about $100 appear, the couple was not able to afford this. 11/11/17  READMISSION This is an 84 year old man who is not a diabetic. He does have a history of sick sinus syndrome atrial fibrillation and has a cardiac pacemaker and is on Coumadin.he also has a history of DVT.  His wife states that episodically over the last 2 years he has had wounds on his lower extremities but they have healed with home measures. Roughly 2-3 weeks ago the legs became a lot more swollen and he has had draining areas on the right anterior medial and lateral and the left medial leg. The patient's wife states that they were told to put support stockings on him but they are concerned because they worsen the blisters. this bit difficult to get the history but they think the edema has been increasing over the last several months. 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 He does not complain of chest pain orthopnea. He does not have a history of kidney issues that he is aware. Primary doctor at [facility] He is not on diuretics. He was here 2 years ago. He was noted to have edema in his legs at that time. He had an ultrasound of his legs which I'll need to review. There was discussion about compression stockings but he is complaining clearly not using them. ABI in the right in this clinic was 1.37, 1.07 on the left 11/18/17; this is an elderly frail man who has bilateral small lower extremity wounds in the setting of very significant multifactorial edema. He probably has chronic venous insufficiency with lymphedema but it is clearly fluid overloaded last week. He apparently has started on diuretics through his primary physician at [facility] but the patient's wife is not sure which one but states that he goes to the bathroom every 15 minutes. He is tolerating compression wraps. His wife stated that they charged him full price stating that he didn't have open wounds on his legs that would cover wraparound stockings. His edema is much  better controlled 11/25/17-he is here in follow-up evaluation for bilateral lower extremity ulcers. He is tolerating compression therapy. There was one episode since last appointment with copious amounts of drainage from the right medial malleolus. We will add additional absorptive dressing, otherwise no change in treatment plan and he will follow-up next week 12/02/17; since I last saw this man is edema control is a lot better and I think this is a combination of diuretic therapy and are 3 layer compression. He has severe bilateral venous insufficiency with hemosiderin deposition and likely chronic stasis dermatitis/inflammation. In spite of the edema control he has several weeping areas on both legs including the left lateral leg, area on the right posterior leg. He also has an excoriated area just below his right tibial tuberosity. Fortuitously the patient has a follow-up with cardiology tomorrow 12/09/17; his edema control is excellent now on 40 mg of Lasix on his cardiologist last week and according to his wife was thoroughly evaluated including checking his pacemaker and no medication changes are made. He has good edema control in his lower legs. He has significant chronic venous insufficiency with hemosiderin physician as well as stasis dermatitis. major [name], [name] ([id number]) areas on the medial malleoli 12/16/17; he has excellent edema control however he is still having a lot of drainage and a lot of discomfort on the medial part of his right ankle/calf. The only other wound of any substances on the left medial malleolus. He is not been systemically unwell. 12/23/17; he has excellent edema control however he still has weeping edema fluid coming out of the medial part of the right ankle. The exact cause of this is unclear I gave him antibiotics last week however I  don't really think that this is made a lot of difference. The other wound is on the left medial malleolus. This required  debridement 12/30/17; he has good edema control bilaterally. Still having some weeping edema coming up the medial part of the right ankle. I suspect he has chronic venous inflammation/raises dermatitis. Some component of lymphedema 01/06/18; patient has very good edema control bilaterally. We've been using 3 alert compression which he seems to tolerate. I suspect he has chronic venous inflammation stasis dermatitis and some component of lymphedema. He had 3 areas we are following one on the right medial ankle, one on the left medial malleolus and one on the left anterior leg. Using silver alginate 01/13/18; patient we changed to silver collagen last week. He is been using his own juxta light stockings. He comes in today his edema is well controlled.. Still some areas on the left anterior leg but everything on the right medial ankle looks closed. His wife is asking questions about his Lasix how long he needs to be on this etc. etc. doesn't look like he's had a lot of follow-up on his electrolytes and I've ordered a basic metabolic panel Objective Constitutional Sitting or standing Blood Pressure is within target range for patient.. Pulse regular and within target range for patient.Marland Kitchen Respirations regular, non-labored and within target range.. Temperature is normal and within the target range for the patient.Marland Kitchen appears in no distress.Not as fibrinous-looking as I am used to seeing. Vitals Time Taken: 3:48 PM, Height: 68 in, Weight: 150 lbs, BMI: 22.8, Temperature: 98.3 F, Pulse: 90 bpm, Respiratory Rate: 16 breaths/min, Blood Pressure: 132/80 mmHg. Respiratory Respiratory effort is easy and symmetric bilaterally. Rate is normal at rest and on room air.. Cardiovascular He does not look overtly dehydrated. Pedal pulses palpable and strong bilaterally.. Edema is well-controlled. Lymphatic None palpable in the popliteal or inguinal area. Musculoskeletal Other than the severe hemosiderin deposition  bilaterally no overt skin issues. Psychiatric Somewhat less talkative but in general no overt pathology. General Notes: Wound exam; there is no open area on the right medial ankle. There is no weeping edema fluid. He has Areas over the left anterior and left medial but these look better than last week. Severe chronic venous insufficiency with hemosiderin deposition. Integumentary (Hair, Skin) Wound #10 status is Healed - Epithelialized. Original cause of wound was Gradually Appeared. The wound is located on the Right,Medial Malleolus. The wound measures 0cm length x 0cm width x 0cm depth; 0cm^2 area and 0cm^3 volume. There is [name], [name] ([id number]) no tunneling or undermining noted. There is a none present amount of drainage noted. The wound margin is flat and intact. There is no granulation within the wound bed. There is a large (67-100%) amount of necrotic tissue within the wound bed including Eschar. Periwound temperature was noted as No Abnormality. The periwound has tenderness on palpation. Wound #14 status is Open. Original cause of wound was Shear/Friction. The wound is located on the Left,Lateral Lower Leg. The wound measures 2.3cm length x 3.2cm width x 0.1cm depth; 5.781cm^2 area and 0.578cm^3 volume. There is no tunneling or undermining noted. There is a medium amount of serous drainage noted. The wound margin is flat and intact. There is large (67-100%) red granulation within the wound bed. There is a small (1-33%) amount of necrotic tissue within the wound bed including Adherent Slough. The periwound skin appearance exhibited: Excoriation. The periwound skin appearance did not exhibit: Callus, Crepitus, Induration, Rash, Scarring, Dry/Scaly, Maceration, Atrophie Blanche, Cyanosis, Ecchymosis,  Hemosiderin Staining, Mottled, Pallor, Rubor, Erythema. Periwound temperature was noted as No Abnormality. The periwound has tenderness on palpation. Wound #15 status is Open. Original  cause of wound was Shear/Friction. The wound is located on the Left,Medial Lower Leg. The wound measures 0.4cm length x 0.7cm width x 0.1cm depth; 0.22cm^2 area and 0.022cm^3 volume. There is no tunneling or undermining noted. There is a small amount of serosanguineous drainage noted. The wound margin is flat and intact. There is large (67-100%) red granulation within the wound bed. There is a small (1-33%) amount of necrotic tissue within the wound bed including Adherent Slough. The periwound skin appearance exhibited: Excoriation. The periwound skin appearance did not exhibit: Callus, Crepitus, Induration, Rash, Scarring, Dry/Scaly, Maceration, Atrophie Blanche, Cyanosis, Ecchymosis, Hemosiderin Staining, Mottled, Pallor, Rubor, Erythema. Periwound temperature was noted as No Abnormality. The periwound has tenderness on palpation. Wound #5 status is Open. Original cause of wound was Blister. The wound is located on the Left,Medial Malleolus. The wound measures 0.4cm length x 0.4cm width x 0.1cm depth; 0.126cm^2 area and 0.013cm^3 volume. Assessment Active Problems ICD-10 L97.211 - Non-pressure chronic ulcer of right calf limited to breakdown of skin L97.221 - Non-pressure chronic ulcer of left calf limited to breakdown of skin I87.333 - Chronic venous hypertension (idiopathic) with ulcer and inflammation of bilateral lower extremity Plan Wound Cleansing: Wound #14 Left,Lateral Lower Leg: Cleanse wound with mild soap and water - HHRN please wash legs Wound #15 Left,Medial Lower Leg: Cleanse wound with mild soap and water - HHRN please wash legs Wound #5 Left,Medial Malleolus: Cleanse wound with mild soap and water - HHRN please wash legs Anesthetic (add to Medication List): Wound #14 Left,Lateral Lower Leg: Topical Lidocaine 4% cream applied to wound bed prior to debridement (In Clinic Only). Wound #15 Left,Medial Lower Leg: Topical Lidocaine 4% cream applied to wound bed prior to  debridement (In Clinic Only). Wound #5 Left,Medial Malleolus: Topical Lidocaine 4% cream applied to wound bed prior to debridement (In Clinic Only). [name], [name] ([id number]) Primary Wound Dressing: Wound #14 Left,Lateral Lower Leg: Silver Collagen Wound #15 Left,Medial Lower Leg: Silver Collagen Wound #5 Left,Medial Malleolus: Silver Collagen Secondary Dressing: Wound #14 Left,Lateral Lower Leg: ABD and Kerlix/Conform Wound #15 Left,Medial Lower Leg: ABD and Kerlix/Conform Wound #5 Left,Medial Malleolus: ABD and Kerlix/Conform Dressing Change Frequency: Wound #14 Left,Lateral Lower Leg: Change Dressing Monday, Wednesday, Friday Wound #15 Left,Medial Lower Leg: Change Dressing Monday, Wednesday, Friday Wound #5 Left,Medial Malleolus: Change Dressing Monday, Wednesday, Friday Follow-up Appointments: Wound #14 Left,Lateral Lower Leg: Return Appointment in 1 week. Wound #15 Left,Medial Lower Leg: Return Appointment in 1 week. Wound #5 Left,Medial Malleolus: Return Appointment in 1 week. Edema Control: Wound #14 Left,Lateral Lower Leg: Patient to wear own Velcro compression garment. Elevate legs to the level of the heart and pump ankles as often as possible Wound #15 Left,Medial Lower Leg: Patient to wear own Velcro compression garment. Elevate legs to the level of the heart and pump ankles as often as possible Wound #5 Left,Medial Malleolus: Patient to wear own Velcro compression garment. Elevate legs to the level of the heart and pump ankles as often as possible Additional Orders / Instructions: Wound #14 Left,Lateral Lower Leg: Vitamin A; Vitamin C, Zinc Activity as tolerated Wound #15 Left,Medial Lower Leg: Vitamin A; Vitamin C, Zinc Activity as tolerated Wound #5 Left,Medial Malleolus: Vitamin A; Vitamin C, Zinc Activity as tolerated Home Health: Wound #14 Left,Lateral Lower Leg: Continue Home Health Visits Home Health Nurse may visit PRN to address  patient s wound care needs. FACE TO FACE ENCOUNTER: MEDICARE and MEDICAID PATIENTS: I certify that this patient is under my care and that I had a face-to-face encounter that meets the physician face-to-face encounter requirements with this patient on this date. The encounter with the patient was in whole or in part for the following MEDICAL CONDITION: (primary reason for Home Healthcare) MEDICAL NECESSITY: I certify, that based on my findings, NURSING services are a medically necessary home health service. HOME BOUND STATUS: I certify that my clinical findings support that this patient is homebound (i.e., Due to illness or injury, pt requires aid of supportive devices such as crutches, cane, wheelchairs, walkers, the use of special transportation or the assistance of another person to leave their place of residence. There is a normal inability to leave the home and doing so requires considerable and taxing effort. Other absences are for medical reasons / religious services and [name], [name]. ([id number]) are infrequent or of short duration when for other reasons). If current dressing causes regression in wound condition, may D/C ordered dressing product/s and apply Normal Saline Moist Dressing daily until next Wound Healing Center / Other MD appointment. Notify Wound Healing Center of regression in wound condition at [phone]. Please direct any NON-WOUND related issues/requests for orders to patient's Primary Care Physician Wound #15 Left,Medial Lower Leg: Continue Home Health Visits Home Health Nurse may visit PRN to address patient s wound care needs. FACE TO FACE ENCOUNTER: MEDICARE and MEDICAID PATIENTS: I certify that this patient is under my care and that I had a face-to-face encounter that meets the physician face-to-face encounter requirements with this patient on this date. The encounter with the patient was in whole or in part for the following MEDICAL CONDITION: (primary reason  for Home Healthcare) MEDICAL NECESSITY: I certify, that based on my findings, NURSING services are a medically necessary home health service. HOME BOUND STATUS: I certify that my clinical findings support that this patient is homebound (i.e., Due to illness or injury, pt requires aid of supportive devices such as crutches, cane, wheelchairs, walkers, the use of special transportation or the assistance of another person to leave their place of residence. There is a normal inability to leave the home and doing so requires considerable and taxing effort. Other absences are for medical reasons / religious services and are infrequent or of short duration when for other reasons). If current dressing causes regression in wound condition, may D/C ordered dressing product/s and apply Normal Saline Moist Dressing daily until next Wound Healing Center / Other MD appointment. Notify Wound Healing Center of regression in wound condition at [phone]. Please direct any NON-WOUND related issues/requests for orders to patient's Primary Care Physician Wound #5 Left,Medial Malleolus: Continue Home Health Visits Home Health Nurse may visit PRN to address patient s wound care needs. FACE TO FACE ENCOUNTER: MEDICARE and MEDICAID PATIENTS: I certify that this patient is under my care and that I had a face-to-face encounter that meets the physician face-to-face encounter requirements with this patient on this date. The encounter with the patient was in whole or in part for the following MEDICAL CONDITION: (primary reason for Home Healthcare) MEDICAL NECESSITY: I certify, that based on my findings, NURSING services are a medically necessary home health service. HOME BOUND STATUS: I certify that my clinical findings support that this patient is homebound (i.e., Due to illness or injury, pt requires aid of supportive devices such as crutches, cane, wheelchairs, walkers, the use of special transportation or  the  assistance of another person to leave their place of residence. There is a normal inability to leave the home and doing so requires considerable and taxing effort. Other absences are for medical reasons / religious services and are infrequent or of short duration when for other reasons). If current dressing causes regression in wound condition, may D/C ordered dressing product/s and apply Normal Saline Moist Dressing daily until next Wound Healing Center / Other MD appointment. Notify Wound Healing Center of regression in wound condition at [phone]. Please direct any NON-WOUND related issues/requests for orders to patient's Primary Care Physician ordered were: BMP - Drawn by [facility]. Please fax results to [phone]. #1I'm continuing was silver collagen to the left medial open wounds #2 his edema is well controlled bilaterally Using his own juxta light stockings. In particular the right medial wound which was the weeping edema area has closed #3The patient looks somewhat weaker in contact. I'm going to order basic metabolic panel on him as he is on 40 mg of Lasix and has had a considerable reduction in his lower extremity edema. #4 he appears close to being "healed" he also has external compression pumps at home #5 we'll see how he does this week Electronic Signature(s) Signed: 01/14/2018 1:16:01 PM By: Baltazar Najjar MD [name] ([id number]) Entered By: Baltazar Najjar on 01/14/2018 07:59:07 [name] ([id number]) -------------------------------------------------------------------------------- SuperBill Details Patient Name: Donald Musca C. Date of Service: 01/13/2018 Medical Record Number: [id number] Patient Account Number: [id number] Date of Birth/Sex: 03/02/1929 (84 y.o. M) Treating RN: Huel Coventry Primary Care Provider: Guerry Bruin Other Clinician: Referring Provider: Guerry Bruin Treating Provider/Extender: Altamese Lorenzo in Treatment:  9 Diagnosis Coding ICD-10 Codes Code Description [phone] Non-pressure chronic ulcer of right calf limited to breakdown of skin L97.221 Non-pressure chronic ulcer of left calf limited to breakdown of skin I87.333 Chronic venous hypertension (idiopathic) with ulcer and inflammation of bilateral lower extremity Facility Procedures CPT4 Code: [id number] Description: 99213 - WOUND CARE VISIT-LEV 3 EST PT Modifier: Quantity: 1 Physician Procedures CPT4: Description Modifier Quantity Code [id number] 99213 - WC PHYS LEVEL 3 - EST PT 1 ICD-10 Diagnosis Description L97.211 Non-pressure chronic ulcer of right calf limited to breakdown of skin L97.221 Non-pressure chronic ulcer of left calf limited to  breakdown of skin I87.333 Chronic venous hypertension (idiopathic) with ulcer and inflammation of bilateral lower extremity Electronic Signature(s) Signed: 01/14/2018 1:16:01 PM By: Baltazar Najjar MD Entered By: Baltazar Najjar on 01/14/2018 07:59:28

## 2018-01-16 NOTE — Progress Notes (Signed)
LAAKEA, PEREIRA ([id number]) Visit Report for 01/13/2018 Arrival Information Details Patient Name: [name], [name]. Date of Service: 01/13/2018 3:45 PM Medical Record Number: [id number] Patient Account Number: [id number] Date of Birth/Sex: [birth year] (84 y.o. M) Treating RN: Phillis Haggis Primary Care Katrece Roediger: Guerry Bruin Other Clinician: Referring Courtny Bennison: Guerry Bruin Treating Sofija Antwi/Extender: Altamese [place] in Treatment: 9 Visit Information History Since Last Visit All ordered tests and consults were completed: No Patient Arrived: Wheel Chair Added or deleted any medications: No Arrival Time: 15:47 Any new allergies or adverse reactions: No Accompanied By: wife Had a fall or experienced change in No Transfer Assistance: EasyPivot Patient activities of daily living that may affect Lift risk of falls: Patient Identification Verified: Yes Signs or symptoms of abuse/neglect since last visito No Secondary Verification Process Yes Hospitalized since last visit: No Completed: Implantable device outside of the clinic excluding No Patient Requires Transmission-Based No cellular tissue based products placed in the center Precautions: since last visit: Patient Has Alerts: Yes Has Dressing in Place as Prescribed: Yes Patient Alerts: Patient on Blood Has Compression in Place as Prescribed: Yes Thinner Warfarin Pain Present Now: No Pacemaker Electronic Signature(s) Signed: 01/13/2018 4:48:17 PM By: Alejandro Mulling Entered By: Alejandro Mulling on 01/13/2018 15:48:11 [name] ([id number]) -------------------------------------------------------------------------------- Clinic Level of Care Assessment Details Patient Name: [name] Date of Service: 01/13/2018 3:45 PM Medical Record Number: [id number] Patient Account Number: [id number] Date of Birth/Sex: 1929/07/23 (84 y.o. M) Treating RN: Huel Coventry Primary Care Kylor Valverde: Guerry Bruin  Other Clinician: Referring Guinn Delarosa: Guerry Bruin Treating Jaceion Aday/Extender: Altamese  in Treatment: 9 Clinic Level of Care Assessment Items TOOL 4 Quantity Score []  - Use when only an EandM is performed on FOLLOW-UP visit 0 ASSESSMENTS - Nursing Assessment / Reassessment []  - Reassessment of Co-morbidities (includes updates in patient status) 0 X- 1 5 Reassessment of Adherence to Treatment Plan ASSESSMENTS - Wound and Skin Assessment / Reassessment X - Simple Wound Assessment / Reassessment - one wound 1 5 []  - 0 Complex Wound Assessment / Reassessment - multiple wounds []  - 0 Dermatologic / Skin Assessment (not related to wound area) ASSESSMENTS - Focused Assessment []  - Circumferential Edema Measurements - multi extremities 0 []  - 0 Nutritional Assessment / Counseling / Intervention []  - 0 Lower Extremity Assessment (monofilament, tuning fork, pulses) []  - 0 Peripheral Arterial Disease Assessment (using hand held doppler) ASSESSMENTS - Ostomy and/or Continence Assessment and Care []  - Incontinence Assessment and Management 0 []  - 0 Ostomy Care Assessment and Management (repouching, etc.) PROCESS - Coordination of Care X - Simple Patient / Family Education for ongoing care 1 15 []  - 0 Complex (extensive) Patient / Family Education for ongoing care []  - 0 Staff obtains Chiropractor, Records, Test Results / Process Orders []  - 0 Staff telephones HHA, Nursing Homes / Clarify orders / etc []  - 0 Routine Transfer to another Facility (non-emergent condition) []  - 0 Routine Hospital Admission (non-emergent condition) []  - 0 New Admissions / Manufacturing engineer / Ordering NPWT, Apligraf, etc. []  - 0 Emergency Hospital Admission (emergent condition) X- 1 10 Simple Discharge Coordination Else, Hamlin C. ([id number]) []  - 0 Complex (extensive) Discharge Coordination PROCESS - Special Needs []  - Pediatric / Minor Patient Management 0 []  - 0 Isolation  Patient Management []  - 0 Hearing / Language / Visual special needs []  - 0 Assessment of Community assistance (transportation, D/C planning, etc.) []  - 0 Additional assistance / Altered mentation []  - 0 Support Surface(s) Assessment (  bed, cushion, seat, etc.) INTERVENTIONS - Wound Cleansing / Measurement []  - Simple Wound Cleansing - one wound 0 X- 2 5 Complex Wound Cleansing - multiple wounds X- 1 5 Wound Imaging (photographs - any number of wounds) []  - 0 Wound Tracing (instead of photographs) []  - 0 Simple Wound Measurement - one wound X- 2 5 Complex Wound Measurement - multiple wounds INTERVENTIONS - Wound Dressings []  - Small Wound Dressing one or multiple wounds 0 X- 1 15 Medium Wound Dressing one or multiple wounds []  - 0 Large Wound Dressing one or multiple wounds []  - 0 Application of Medications - topical []  - 0 Application of Medications - injection INTERVENTIONS - Miscellaneous []  - External ear exam 0 []  - 0 Specimen Collection (cultures, biopsies, blood, body fluids, etc.) []  - 0 Specimen(s) / Culture(s) sent or taken to Lab for analysis []  - 0 Patient Transfer (multiple staff / Nurse, adult / Similar devices) []  - 0 Simple Staple / Suture removal (25 or less) []  - 0 Complex Staple / Suture removal (26 or more) []  - 0 Hypo / Hyperglycemic Management (close monitor of Blood Glucose) []  - 0 Ankle / Brachial Index (ABI) - do not check if billed separately X- 1 5 Vital Signs Piascik, Berk C. ([id number]) Has the patient been seen at the hospital within the last three years: Yes Total Score: 80 Level Of Care: New/Established - Level 3 Electronic Signature(s) Signed: 01/13/2018 5:47:31 PM By: Elliot Gurney, [name], RN, CWS, [name], [name] Entered By: Elliot Gurney, [name], RN, CWS, Kim on 01/13/2018 16:16:28 [name] ([id number]) -------------------------------------------------------------------------------- Encounter Discharge Information Details Patient Name:  Donald Musca C. Date of Service: 01/13/2018 3:45 PM Medical Record Number: [id number] Patient Account Number: [id number] Date of Birth/Sex: 07/14/1929 (84 y.o. M) Treating RN: Phillis Haggis Primary Care Devetta Hagenow: Guerry Bruin Other Clinician: Referring Kahlea Cobert: Guerry Bruin Treating Orby Tangen/Extender: Altamese [place] in Treatment: 9 Encounter Discharge Information Items Discharge Condition: Stable Ambulatory Status: Wheelchair Discharge Destination: Home Transportation: Private Auto Schedule Follow-up Appointment: Yes Clinical Summary of Care: Electronic Signature(s) Signed: 01/13/2018 4:48:17 PM By: Alejandro Mulling Entered By: Alejandro Mulling on 01/13/2018 16:39:18 [name] ([id number]) -------------------------------------------------------------------------------- Lower Extremity Assessment Details Patient Name: Donald Musca C. Date of Service: 01/13/2018 3:45 PM Medical Record Number: [id number] Patient Account Number: [id number] Date of Birth/Sex: 12/07/1928 (84 y.o. M) Treating RN: Phillis Haggis Primary Care Jailynn Lavalais: Guerry Bruin Other Clinician: Referring Sherre Wooton: Guerry Bruin Treating Joeline Freer/Extender: Altamese [place] in Treatment: 9 Edema Assessment Assessed: [Left: No] [Right: No] Edema: [Left: No] [Right: No] Calf Left: Right: Point of Measurement: 33 cm From Medial Instep 29.1 cm 29.4 cm Ankle Left: Right: Point of Measurement: 12 cm From Medial Instep 21.1 cm 20.8 cm Vascular Assessment Claudication: Claudication Assessment [Left:None] [Right:None] Pulses: Dorsalis Pedis Palpable: [Left:Yes] [Right:Yes] Posterior Tibial Extremity colors, hair growth, and conditions: Extremity Color: [Left:Hyperpigmented] [Right:Hyperpigmented] Hair Growth on Extremity: [Left:No] [Right:No] Temperature of Extremity: [Left:Warm] [Right:Cool] Capillary Refill: [Left:< 3 seconds] [Right:< 3 seconds] Toe Nail  Assessment Left: Right: Thick: Yes Yes Discolored: Yes Yes Deformed: Yes Yes Improper Length and Hygiene: Yes Yes Electronic Signature(s) Signed: 01/13/2018 4:48:17 PM By: Alejandro Mulling Entered By: Alejandro Mulling on 01/13/2018 16:06:59 [place], [name] ([id number]) -------------------------------------------------------------------------------- Multi Wound Chart Details Patient Name: Donald Musca C. Date of Service: 01/13/2018 3:45 PM Medical Record Number: [id number] Patient Account Number: [id number] Date of Birth/Sex: [birth year] (84 y.o. M) Treating RN: Huel Coventry Primary Care Amyjo Mizrachi: Guerry Bruin Other Clinician: Referring Kaylah Chiasson: Guerry Bruin Treating  Zella Dewan/Extender: Maxwell Caul We[place] in Treatment: 9 Vital Signs Height(in): 68 Pulse(bpm): 90 Weight(lbs): 150 Blood Pressure(mmHg): 132/80 Body Mass Index(BMI): 23 Temperature(F): 98.3 Respiratory Rate 16 (breaths/min): Photos: Wound Location: Right, Medial Malleolus Left Lower Leg - Lateral Left Lower Leg - Medial Wounding Event: Gradually Appeared Shear/Friction Shear/Friction Primary Etiology: Venous Leg Ulcer Skin Tear Trauma, Other Comorbid History: Cataracts, Arrhythmia, Deep Cataracts, Arrhythmia, Deep Cataracts, Arrhythmia, Deep Vein Thrombosis, Vein Thrombosis, Vein Thrombosis, Osteoarthritis Osteoarthritis Osteoarthritis Date Acquired: 12/02/2017 01/06/2018 01/06/2018 Weeks of Treatment: 6 0 0 Wound Status: Healed - Epithelialized Open Open Clustered Wound: No Yes Yes Clustered Quantity: N/A 6 4 Measurements L x W x D 0x0x0 2.3x3.2x0.1 0.4x0.7x0.1 (cm) Area (cm) : 0 5.781 0.22 Volume (cm) : 0 0.578 0.022 % Reduction in Area: 100.00% 0.00% N/A % Reduction in Volume: 100.00% 0.00% N/A Classification: Full Thickness Without Partial Thickness Partial Thickness Exposed Support Structures Exudate Amount: None Present Medium Small Exudate Type: N/A Serous Serosanguineous Exudate  Color: N/A amber red, brown Wound Margin: Flat and Intact Flat and Intact Flat and Intact Granulation Amount: None Present (0%) Large (67-100%) Large (67-100%) Granulation Quality: N/A Red Red Necrotic Amount: Large (67-100%) Small (1-33%) Small (1-33%) Necrotic Tissue: Eschar Adherent Slough Adherent Slough Epithelialization: None Small (1-33%) None Periwound Skin Texture: No Abnormalities Noted [name], [name] ([id number]) Excoriation: Yes Excoriation: Yes Induration: No Induration: No Callus: No Callus: No Crepitus: No Crepitus: No Rash: No Rash: No Scarring: No Scarring: No Periwound Skin Moisture: No Abnormalities Noted Maceration: No Maceration: No Dry/Scaly: No Dry/Scaly: No Periwound Skin Color: No Abnormalities Noted Atrophie Blanche: No Atrophie Blanche: No Cyanosis: No Cyanosis: No Ecchymosis: No Ecchymosis: No Erythema: No Erythema: No Hemosiderin Staining: No Hemosiderin Staining: No Mottled: No Mottled: No Pallor: No Pallor: No Rubor: No Rubor: No Temperature: No Abnormality No Abnormality No Abnormality Tenderness on Palpation: Yes Yes Yes Wound Preparation: Ulcer Cleansing: Ulcer Cleansing: Ulcer Cleansing: Rinsed/Irrigated with Saline, Rinsed/Irrigated with Saline Rinsed/Irrigated with Saline Other: soap and water Topical Anesthetic Applied: Topical Anesthetic Applied: Topical Anesthetic Applied: Other: lidocaine 4% None Other: lidocaine 4% Wound Number: 5 N/A N/A Photos: N/A N/A Wound Location: Left, Medial Malleolus N/A N/A Wounding Event: Blister N/A N/A Primary Etiology: Venous Leg Ulcer N/A N/A Comorbid History: N/A N/A N/A Date Acquired: 10/28/2017 N/A N/A Weeks of Treatment: 9 N/A N/A Wound Status: Open N/A N/A Clustered Wound: No N/A N/A Clustered Quantity: N/A N/A N/A Measurements L x W x D 0.4x0.4x0.1 N/A N/A (cm) Area (cm) : 0.126 N/A N/A Volume (cm) : 0.013 N/A N/A % Reduction in Area: 46.60% N/A N/A % Reduction  in Volume: 45.80% N/A N/A Classification: Full Thickness Without N/A N/A Exposed Support Structures Exudate Amount: N/A N/A N/A Exudate Type: N/A N/A N/A Exudate Color: N/A N/A N/A Wound Margin: N/A N/A N/A Granulation Amount: N/A N/A N/A Granulation Quality: N/A N/A N/A [name], [name] ([id number]) Necrotic Amount: N/A N/A N/A Necrotic Tissue: N/A N/A N/A Exposed Structures: N/A N/A N/A Epithelialization: N/A N/A N/A Periwound Skin Texture: No Abnormalities Noted N/A N/A Periwound Skin Moisture: No Abnormalities Noted N/A N/A Periwound Skin Color: No Abnormalities Noted N/A N/A Temperature: N/A N/A N/A Tenderness on Palpation: No N/A N/A Wound Preparation: N/A N/A N/A Treatment Notes Wound #14 (Left, Lateral Lower Leg) 1. Cleansed with: Clean wound with Normal Saline 4. Dressing Applied: Prisma Ag 5. Secondary Dressing Applied ABD Pad Kerlix/Conform 7. Secured with Patient to wear own compression stockings Notes juxtalite wraps bilateral with compression stockings Wound #15 (  Left, Medial Lower Leg) 1. Cleansed with: Clean wound with Normal Saline 4. Dressing Applied: Prisma Ag 5. Secondary Dressing Applied ABD Pad Kerlix/Conform 7. Secured with Patient to wear own compression stockings Notes juxtalite wraps bilateral with compression stockings Wound #5 (Left, Medial Malleolus) 1. Cleansed with: Clean wound with Normal Saline 4. Dressing Applied: Prisma Ag 5. Secondary Dressing Applied ABD Pad Kerlix/Conform 7. Secured with Patient to wear own compression stockings Notes juxtalite wraps bilateral with compression stockings KIMOTHY, KISHIMOTO ([id number]) Electronic Signature(s) Signed: 01/14/2018 1:16:01 PM By: Baltazar Najjar MD Previous Signature: 01/13/2018 5:47:31 PM Version By: Elliot Gurney, [name], RN, CWS, [name], [name] Entered By: Baltazar Najjar on 01/14/2018 07:48:49 [name]  ([id number]) -------------------------------------------------------------------------------- Multi-Disciplinary Care Plan Details Patient Name: BLADYN, TIPPS. Date of Service: 01/13/2018 3:45 PM Medical Record Number: [id number] Patient Account Number: [id number] Date of Birth/Sex: [birth year] (84 y.o. M) Treating RN: Huel Coventry Primary Care Rickesha Veracruz: Guerry Bruin Other Clinician: Referring Mozelle Remlinger: Guerry Bruin Treating Keymari Sato/Extender: Altamese [place] in Treatment: 9 Active Inactive ` Abuse / Safety / Falls / Self Care Management Nursing Diagnoses: Impaired physical mobility Potential for falls Goals: Patient will not develop complications from immobility Date Initiated: 11/11/2017 Target Resolution Date: 12/09/2017 Goal Status: Active Patient will remain injury free related to falls Date Initiated: 11/11/2017 Target Resolution Date: 12/09/2017 Goal Status: Active Interventions: Assess fall risk on admission and as needed Notes: ` Nutrition Nursing Diagnoses: Potential for alteratiion in Nutrition/Potential for imbalanced nutrition Goals: Patient/caregiver agrees to and verbalizes understanding of need to use nutritional supplements and/or vitamins as prescribed Date Initiated: 11/11/2017 Target Resolution Date: 12/10/2017 Goal Status: Active Interventions: Assess patient nutrition upon admission and as needed per policy Notes: ` Orientation to the Wound Care Program Nursing Diagnoses: Knowledge deficit related to the wound healing center program Goals: Patient/caregiver will verbalize understanding of the Wound Healing Center [address] JAISHAWN, WITZKE ([id number]) Date Initiated: 11/11/2017 Target Resolution Date: 12/10/2017 Goal Status: Active Interventions: Provide education on orientation to the wound center Notes: ` Venous Leg Ulcer Nursing Diagnoses: Actual venous Insuffiency (use after diagnosis is confirmed) Goals: Non-invasive venous studies  are completed as ordered Date Initiated: 11/11/2017 Target Resolution Date: 12/10/2017 Goal Status: Active Interventions: Assess peripheral edema status every visit. Treatment Activities: Non-invasive vascular studies : 11/11/2017 Notes: ` Wound/Skin Impairment Nursing Diagnoses: Impaired tissue integrity Goals: Ulcer/skin breakdown will have a volume reduction of 80% by week 12 Date Initiated: 11/11/2017 Target Resolution Date: 12/10/2017 Goal Status: Active Interventions: Assess patient/caregiver ability to obtain necessary supplies Provide education on ulcer and skin care Treatment Activities: Topical wound management initiated : 11/11/2017 Notes: Electronic Signature(s) Signed: 01/13/2018 5:47:31 PM By: Elliot Gurney, [name], RN, CWS, [name], [name] Entered By: Elliot Gurney, [name], RN, CWS, Kim on 01/13/2018 16:11:51 [name] ([id number]) -------------------------------------------------------------------------------- Pain Assessment Details Patient Name: Donald Musca C. Date of Service: 01/13/2018 3:45 PM Medical Record Number: [id number] Patient Account Number: [id number] Date of Birth/Sex: 1928-10-01 (84 y.o. M) Treating RN: Phillis Haggis Primary Care Krisalyn Yankowski: Guerry Bruin Other Clinician: Referring Darshan Solanki: Guerry Bruin Treating Johnnie Goynes/Extender: Altamese [place] in Treatment: 9 Active Problems Location of Pain Severity and Description of Pain Patient Has Paino No Site Locations Pain Management and Medication Current Pain Management: Electronic Signature(s) Signed: 01/13/2018 4:48:17 PM By: Alejandro Mulling Entered By: Alejandro Mulling on 01/13/2018 15:48:18 [name] ([id number]) -------------------------------------------------------------------------------- Patient/Caregiver Education Details Patient Name: [name]. Date of Service: 01/13/2018 3:45 PM Medical Record Number: [id number] Patient Account Number: [id number] Date of Birth/Gender:  05-May-1929 (84 y.o. M) Treating RN: Phillis Haggis Primary Care Physician: Guerry Bruin Other Clinician: Referring Physician: Guerry Bruin Treating Physician/Extender: Altamese [place] in Treatment: 9 Education Assessment Education Provided To: Patient Education Topics Provided Wound/Skin Impairment: Handouts: Caring for Your Ulcer Methods: Explain/Verbal Responses: State content correctly Electronic Signature(s) Signed: 01/13/2018 4:48:17 PM By: Alejandro Mulling Entered By: Alejandro Mulling on 01/13/2018 16:39:35 [name], [name] ([id number]) -------------------------------------------------------------------------------- Wound Assessment Details Patient Name: Donald Musca C. Date of Service: 01/13/2018 3:45 PM Medical Record Number: [id number] Patient Account Number: [id number] Date of Birth/Sex: [birth year] (84 y.o. M) Treating RN: Huel Coventry Primary Care Tivis Wherry: Guerry Bruin Other Clinician: Referring Maisyn Nouri: Guerry Bruin Treating Aison Malveaux/Extender: Altamese [place] in Treatment: 9 Wound Status Wound Number: 10 Primary Venous Leg Ulcer Etiology: Wound Location: Right, Medial Malleolus Wound Status: Healed - Epithelialized Wounding Event: Gradually Appeared Comorbid Cataracts, Arrhythmia, Deep Vein Date Acquired: 12/02/2017 History: Thrombosis, Osteoarthritis Weeks Of Treatment: 6 Clustered Wound: No Photos Photo Uploaded By: Alejandro Mulling on 01/13/2018 16:58:34 Wound Measurements Length: (cm) 0 Width: (cm) 0 Depth: (cm) 0 Area: (cm) 0 Volume: (cm) 0 % Reduction in Area: 100% % Reduction in Volume: 100% Epithelialization: None Tunneling: No Undermining: No Wound Description Full Thickness Without Exposed Support Classification: Structures Wound Margin: Flat and Intact Exudate None Present Amount: Foul Odor After Cleansing: No Slough/Fibrino Yes Wound Bed Granulation Amount: None Present (0%) Necrotic Amount:  Large (67-100%) Necrotic Quality: Eschar Periwound Skin Texture Texture Color No Abnormalities Noted: No No Abnormalities Noted: No Moisture Temperature / Pain No Abnormalities Noted: No Temperature: No Abnormality [name], [name] ([id number]) Tenderness on Palpation: Yes Wound Preparation Ulcer Cleansing: Rinsed/Irrigated with Saline, Other: soap and water, Topical Anesthetic Applied: Other: lidocaine 4%, Electronic Signature(s) Signed: 01/13/2018 5:47:31 PM By: Elliot Gurney, [name], RN, CWS, [name], [name] Entered By: Elliot Gurney, [name], RN, CWS, Kim on 01/13/2018 16:13:26 [name] ([id number]) -------------------------------------------------------------------------------- Wound Assessment Details Patient Name: Donald Musca C. Date of Service: 01/13/2018 3:45 PM Medical Record Number: [id number] Patient Account Number: [id number] Date of Birth/Sex: October 14, 1928 (84 y.o. M) Treating RN: Phillis Haggis Primary Care Tannen Vandezande: Guerry Bruin Other Clinician: Referring Stonewall Doss: Guerry Bruin Treating Orby Tangen/Extender: Altamese [place] in Treatment: 9 Wound Status Wound Number: 14 Primary Skin Tear Etiology: Wound Location: Left Lower Leg - Lateral Wound Status: Open Wounding Event: Shear/Friction Comorbid Cataracts, Arrhythmia, Deep Vein Date Acquired: 01/06/2018 History: Thrombosis, Osteoarthritis Weeks Of Treatment: 0 Clustered Wound: Yes Photos Photo Uploaded By: Alejandro Mulling on 01/13/2018 16:57:49 Wound Measurements Length: (cm) 2.3 % Re Width: (cm) 3.2 % Re Depth: (cm) 0.1 Epit Clustered Quantity: 6 Tunn Area: (cm) 5.781 Und Volume: (cm) 0.578 duction in Area: 0% duction in Volume: 0% helialization: Small (1-33%) eling: No ermining: No Wound Description Classification: Partial Thickness Wound Margin: Flat and Intact Exudate Amount: Medium Exudate Type: Serous Exudate Color: amber Foul Odor After Cleansing: No Slough/Fibrino Yes Wound  Bed Granulation Amount: Large (67-100%) Granulation Quality: Red Necrotic Amount: Small (1-33%) Necrotic Quality: Adherent Slough Periwound Skin Texture Texture Color No Abnormalities Noted: No No Abnormalities Noted: No [name], [name] ([id number]) Callus: No Atrophie Blanche: No Crepitus: No Cyanosis: No Excoriation: Yes Ecchymosis: No Induration: No Erythema: No Rash: No Hemosiderin Staining: No Scarring: No Mottled: No Pallor: No Moisture Rubor: No No Abnormalities Noted: No Dry / Scaly: No Temperature / Pain Maceration: No Temperature: No Abnormality Tenderness on Palpation: Yes Wound Preparation Ulcer Cleansing: Rinsed/Irrigated with Saline Topical Anesthetic Applied: Other: lidocaine 4%, Treatment Notes Wound #14 (Left,  Lateral Lower Leg) 1. Cleansed with: Clean wound with Normal Saline 4. Dressing Applied: Prisma Ag 5. Secondary Dressing Applied ABD Pad Kerlix/Conform 7. Secured with Patient to wear own compression stockings Notes juxtalite wraps bilateral with compression stockings Electronic Signature(s) Signed: 01/13/2018 4:48:17 PM By: Alejandro Mulling Entered By: Alejandro Mulling on 01/13/2018 16:04:00 [name] ([id number]) -------------------------------------------------------------------------------- Wound Assessment Details Patient Name: Donald Musca C. Date of Service: 01/13/2018 3:45 PM Medical Record Number: [id number] Patient Account Number: [id number] Date of Birth/Sex: [birth year] (84 y.o. M) Treating RN: Phillis Haggis Primary Care Liela Rylee: Guerry Bruin Other Clinician: Referring Abhinav Mayorquin: Guerry Bruin Treating Zriyah Kopplin/Extender: Altamese [place] in Treatment: 9 Wound Status Wound Number: 15 Primary Trauma, Other Etiology: Wound Location: Left Lower Leg - Medial Wound Status: Open Wounding Event: Shear/Friction Comorbid Cataracts, Arrhythmia, Deep Vein Date Acquired: 01/06/2018 History: Thrombosis,  Osteoarthritis Weeks Of Treatment: 0 Clustered Wound: Yes Photos Photo Uploaded By: Alejandro Mulling on 01/13/2018 16:56:56 Wound Measurements Length: (cm) 0.4 Width: (cm) 0.7 Depth: (cm) 0.1 Clustered Quantity: 4 Area: (cm) 0.22 Volume: (cm) 0.022 % Reduction in Area: % Reduction in Volume: Epithelialization: None Tunneling: No Undermining: No Wound Description Classification: Partial Thickness Wound Margin: Flat and Intact Exudate Amount: Small Exudate Type: Serosanguineous Exudate Color: red, brown Foul Odor After Cleansing: No Slough/Fibrino Yes Wound Bed Granulation Amount: Large (67-100%) Exposed Structure Granulation Quality: Red Fascia Exposed: No Necrotic Amount: Small (1-33%) Fat Layer (Subcutaneous Tissue) Exposed: No Necrotic Quality: Adherent Slough Tendon Exposed: No Muscle Exposed: No Joint Exposed: No Bone Exposed: No Periwound Skin Texture [name], [name] ([id number]) Texture Color No Abnormalities Noted: No No Abnormalities Noted: No Callus: No Atrophie Blanche: No Crepitus: No Cyanosis: No Excoriation: Yes Ecchymosis: No Induration: No Erythema: No Rash: No Hemosiderin Staining: No Scarring: No Mottled: No Pallor: No Moisture Rubor: No No Abnormalities Noted: No Dry / Scaly: No Temperature / Pain Maceration: No Temperature: No Abnormality Tenderness on Palpation: Yes Wound Preparation Ulcer Cleansing: Rinsed/Irrigated with Saline Topical Anesthetic Applied: None Treatment Notes Wound #15 (Left, Medial Lower Leg) 1. Cleansed with: Clean wound with Normal Saline 4. Dressing Applied: Prisma Ag 5. Secondary Dressing Applied ABD Pad Kerlix/Conform 7. Secured with Patient to wear own compression stockings Notes juxtalite wraps bilateral with compression stockings Electronic Signature(s) Signed: 01/13/2018 4:48:17 PM By: Alejandro Mulling Entered By: Alejandro Mulling on 01/13/2018 16:02:52 [name], [name]  ([id number]) -------------------------------------------------------------------------------- Wound Assessment Details Patient Name: Donald Musca C. Date of Service: 01/13/2018 3:45 PM Medical Record Number: [id number] Patient Account Number: [id number] Date of Birth/Sex: 09-14-1928 (84 y.o. M) Treating RN: Phillis Haggis Primary Care Marisol Giambra: Guerry Bruin Other Clinician: Referring Eliani Leclere: Guerry Bruin Treating Leotha Westermeyer/Extender: Altamese [place] in Treatment: 9 Wound Status Wound Number: 5 Primary Etiology: Venous Leg Ulcer Wound Location: Left, Medial Malleolus Wound Status: Open Wounding Event: Blister Date Acquired: 10/28/2017 Weeks Of Treatment: 9 Clustered Wound: No Photos Photo Uploaded By: Alejandro Mulling on 01/13/2018 16:56:56 Wound Measurements Length: (cm) 0.4 Width: (cm) 0.4 Depth: (cm) 0.1 Area: (cm) 0.126 Volume: (cm) 0.013 % Reduction in Area: 46.6% % Reduction in Volume: 45.8% Wound Description Full Thickness Without Exposed Support Classification: Structures Periwound Skin Texture Texture Color No Abnormalities Noted: No No Abnormalities Noted: No Moisture No Abnormalities Noted: No Treatment Notes Wound #5 (Left, Medial Malleolus) 1. Cleansed with: Clean wound with Normal Saline 4. Dressing Applied: [name], [name] ([id number]) 5. Secondary Dressing Applied ABD Pad Kerlix/Conform 7. Secured with Patient to wear own compression stockings Notes juxtalite wraps bilateral  with compression stockings Electronic Signature(s) Signed: 01/13/2018 4:48:17 PM By: Alejandro Mulling Entered By: Alejandro Mulling on 01/13/2018 16:00:30 [name] ([id number]) -------------------------------------------------------------------------------- Vitals Details Patient Name: Donald Musca C. Date of Service: 01/13/2018 3:45 PM Medical Record Number: [id number] Patient Account Number: [id number] Date of Birth/Sex:  January 03, 1929 (84 y.o. M) Treating RN: Phillis Haggis Primary Care Janelis Stelzer: Guerry Bruin Other Clinician: Referring [place] Want: Guerry Bruin Treating Esteven Overfelt/Extender: Altamese [place] in Treatment: 9 Vital Signs Time Taken: 15:48 Temperature (F): 98.3 Height (in): 68 Pulse (bpm): 90 Weight (lbs): 150 Respiratory Rate (breaths/min): 16 Body Mass Index (BMI): 22.8 Blood Pressure (mmHg): 132/80 Reference Range: 80 - 120 mg / dl Electronic Signature(s) Signed: 01/13/2018 4:48:17 PM By: Alejandro Mulling Entered By: Alejandro Mulling on 01/13/2018 15:53:20

## 2018-01-18 DIAGNOSIS — L97211 Non-pressure chronic ulcer of right calf limited to breakdown of skin: Secondary | ICD-10-CM | POA: Diagnosis not present

## 2018-01-18 DIAGNOSIS — M1990 Unspecified osteoarthritis, unspecified site: Secondary | ICD-10-CM | POA: Diagnosis not present

## 2018-01-18 DIAGNOSIS — I87333 Chronic venous hypertension (idiopathic) with ulcer and inflammation of bilateral lower extremity: Secondary | ICD-10-CM | POA: Diagnosis not present

## 2018-01-18 DIAGNOSIS — L97321 Non-pressure chronic ulcer of left ankle limited to breakdown of skin: Secondary | ICD-10-CM | POA: Diagnosis not present

## 2018-01-18 DIAGNOSIS — I10 Essential (primary) hypertension: Secondary | ICD-10-CM | POA: Diagnosis not present

## 2018-01-18 DIAGNOSIS — I4891 Unspecified atrial fibrillation: Secondary | ICD-10-CM | POA: Diagnosis not present

## 2018-01-20 ENCOUNTER — Encounter: Payer: Medicare Other | Admitting: Internal Medicine

## 2018-01-20 DIAGNOSIS — I872 Venous insufficiency (chronic) (peripheral): Secondary | ICD-10-CM | POA: Diagnosis not present

## 2018-01-20 DIAGNOSIS — Z09 Encounter for follow-up examination after completed treatment for conditions other than malignant neoplasm: Secondary | ICD-10-CM | POA: Diagnosis not present

## 2018-01-20 DIAGNOSIS — L97329 Non-pressure chronic ulcer of left ankle with unspecified severity: Secondary | ICD-10-CM | POA: Diagnosis not present

## 2018-01-20 DIAGNOSIS — L97229 Non-pressure chronic ulcer of left calf with unspecified severity: Secondary | ICD-10-CM | POA: Diagnosis not present

## 2018-01-20 DIAGNOSIS — I4891 Unspecified atrial fibrillation: Secondary | ICD-10-CM | POA: Diagnosis not present

## 2018-01-20 DIAGNOSIS — Z7901 Long term (current) use of anticoagulants: Secondary | ICD-10-CM | POA: Diagnosis not present

## 2018-01-20 DIAGNOSIS — Z86718 Personal history of other venous thrombosis and embolism: Secondary | ICD-10-CM | POA: Diagnosis not present

## 2018-01-20 DIAGNOSIS — R609 Edema, unspecified: Secondary | ICD-10-CM | POA: Diagnosis not present

## 2018-01-20 DIAGNOSIS — R600 Localized edema: Secondary | ICD-10-CM | POA: Diagnosis not present

## 2018-01-22 DIAGNOSIS — M545 Low back pain: Secondary | ICD-10-CM | POA: Diagnosis not present

## 2018-01-22 DIAGNOSIS — G894 Chronic pain syndrome: Secondary | ICD-10-CM | POA: Diagnosis not present

## 2018-01-22 DIAGNOSIS — M542 Cervicalgia: Secondary | ICD-10-CM | POA: Diagnosis not present

## 2018-01-22 DIAGNOSIS — Z79899 Other long term (current) drug therapy: Secondary | ICD-10-CM | POA: Diagnosis not present

## 2018-01-22 NOTE — Progress Notes (Signed)
Donald, Potts ([id number]) Visit Report for 01/20/2018 HPI Details Patient Name: [name], [name]. Date of Service: 01/20/2018 3:15 PM Medical Record Number: [id number] Patient Account Number: [id number] Date of Birth/Sex: Jan 01, 1929 (84 y.o. M) Treating RN: Huel Coventry Primary Care Provider: Guerry Bruin Other Clinician: Referring Provider: Guerry Bruin Treating Provider/Extender: Altamese [place] in Treatment: 10 History of Present Illness HPI Description: Pleasant 84 year old with history of chronic venous insufficiency, remote history of DVT, atrial fibrillation (on Coumadin). No history of diabetes. He says that he developed worsening left lower extremity swelling, blisters, and subsequent ulcerations in late October 2016. He was started on Augmentin for left lower extremity cellulitis, which has improved. Arterial ultrasound 07/27/2015 showed no significant peripheral arterial disease. Awaiting venous ultrasound. Performing dressing changes with silver alginate and using a Tubigrip for edema control. He denies any significant pain. No claudication or rest pain. Ambulating per his baseline with a walker. No fever or chills. Moderate drainage. 10/10/15; the patient arrives back today having not been seen in almost 2 months. He has no open wounds on his legs. He has a history of venous insufficiency, arterial insufficiency. He has chronic Coumadin use secondary to atrial fibrillation and a remote history of DVT. Fortunately as he has not been here in 2 months we were not able to order him stockings. He is not able to get on standard compression hose. Juxtalite stockings would cost him probably about $100 appear, the couple was not able to afford this. 11/11/17 READMISSION This is an 84 year old man who is not a diabetic. He does have a history of sick sinus syndrome atrial fibrillation and has a cardiac pacemaker and is on Coumadin.he also has a history of DVT. His wife  states that episodically over the last 2 years he has had wounds on his lower extremities but they have healed with home measures. Roughly 2-3 weeks ago the legs became a lot more swollen and he has had draining areas on the right anterior medial and lateral and the left medial leg. The patient's wife states that they were told to put support stockings on him but they are concerned because they worsen the blisters. this bit difficult to get the history but they think the edema has been increasing over the last several months. 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 He does not complain of chest pain orthopnea. He does not have a history of kidney issues that he is aware. Primary doctor at [facility]. He is not on diuretics. He was here 2 years ago. He was noted to have edema in his legs at that time. He had an ultrasound of his legs which I'll need to review. There was discussion about compression stockings but he is complaining clearly not using them. ABI in the right in this clinic was 1.37, 1.07 on the left 11/18/17; this is an elderly frail man who has bilateral small lower extremity wounds in the setting of very significant multifactorial edema. He probably has chronic venous insufficiency with lymphedema but it is clearly fluid overloaded last week. He apparently has started on diuretics through his primary physician at [facility] but the patient's wife is not sure which one but states that he goes to the bathroom every 15 minutes. He is tolerating compression wraps. His wife stated that they charged him full price stating that he didn't have open wounds on his legs that would cover wraparound stockings. His edema is much  better controlled 11/25/17-he is here in follow-up evaluation for bilateral lower extremity ulcers. He is tolerating compression therapy. There was one episode since last appointment with copious amounts of drainage  from the right medial malleolus. We will add additional absorptive dressing, otherwise no change in treatment plan and he will follow-up next week 12/02/17; since I last saw this man is edema control is a lot better and I think this is a combination of diuretic therapy and are 3 layer compression. He has severe bilateral venous insufficiency with hemosiderin deposition and likely chronic stasis dermatitis/inflammation. In spite of the edema control he has several weeping areas on both legs including the left lateral leg, area on the right posterior leg. He also has an excoriated area just below his right tibial tuberosity. Fortuitously the patient has a follow-up with cardiology tomorrow 12/09/17; his edema control is excellent now on 40 mg of Lasix on his cardiologist last week and according to his wife was Potts, Donald ([id number]) thoroughly evaluated including checking his pacemaker and no medication changes are made. He has good edema control in his lower legs. He has significant chronic venous insufficiency with hemosiderin physician as well as stasis dermatitis. major areas on the medial malleoli 12/16/17; he has excellent edema control however he is still having a lot of drainage and a lot of discomfort on the medial part of his right ankle/calf. The only other wound of any substances on the left medial malleolus. He is not been systemically unwell. 12/23/17; he has excellent edema control however he still has weeping edema fluid coming out of the medial part of the right ankle. The exact cause of this is unclear I gave him antibiotics last week however I don't really think that this is made a lot of difference. The other wound is on the left medial malleolus. This required debridement 12/30/17; he has good edema control bilaterally. Still having some weeping edema coming up the medial part of the right ankle. I suspect he has chronic venous inflammation/raises dermatitis. Some component  of lymphedema 01/06/18; patient has very good edema control bilaterally. We've been using 3 alert compression which he seems to tolerate. I suspect he has chronic venous inflammation stasis dermatitis and some component of lymphedema. He had 3 areas we are following one on the right medial ankle, one on the left medial malleolus and one on the left anterior leg. Using silver alginate 01/13/18; patient we changed to silver collagen last week. He is been using his own juxta light stockings. He comes in today his edema is well controlled.. Still some areas on the left anterior leg but everything on the right medial ankle looks closed. His wife is asking questions about his Lasix how long he needs to be on this etc. etc. doesn't look like he's had a lot of follow-up on his electrolytes and I've ordered a basic metabolic panel 01/20/18; patient has been using his own juxta light stockings. He has excellent edema control. There is no open area on the right leg. He has a small open area on the left medial malleolus which is all that is left of the extensive bilateral wounds he had. As mentioned the edema control is excellent. Electronic Signature(s) Signed: 01/20/2018 4:50:03 PM By: Baltazar Najjar MD Entered By: Baltazar Najjar on 01/20/2018 16:09:34 [name] ([id number]) -------------------------------------------------------------------------------- Physical Exam Details Patient Name: [name] Date of Service: 01/20/2018 3:15 PM Medical Record Number: [id number] Patient Account Number: [id number] Date of Birth/Sex: [birth year] (84 y.o.  M) Treating RN: Huel Coventry Primary Care Provider: Guerry Bruin Other Clinician: Referring Provider: Guerry Bruin Treating Provider/Extender: Altamese [place] in Treatment: 10 Constitutional Sitting or standing Blood Pressure is within target range for patient.. Pulse regular and within target range for patient.Marland Kitchen Respirations regular,  non-labored and within target range.. Temperature is normal and within the target range for the patient.Marland Kitchen appears in no distress.frail elderly man. Cardiovascular pedal pulses are palpable bilaterally. Edema is very well controlled. Integumentary (Hair, Skin) chronic venous insufficiency but no other predominant skin problems. Psychiatric No evidence of depression, anxiety, or agitation. Calm, cooperative, and communicative. Appropriate interactions and affect.. Notes wound exam; there is no open area on the right medial ankle of the right leg itself. Edema control is excellent he has severe hemosiderin deposition. oShe has a small open area on the left medial malleolus. This looks like it's progressing towards closure. No debridement was necessary. He has severe chronic venous insufficiency but his edema is much better controlled combination of juxta light stockings and Lasix Electronic Signature(s) Signed: 01/20/2018 4:50:03 PM By: Baltazar Najjar MD Entered By: Baltazar Najjar on 01/20/2018 16:17:34 [name] ([id number]) -------------------------------------------------------------------------------- Physician Orders Details Patient Name: [name] Date of Service: 01/20/2018 3:15 PM Medical Record Number: [id number] Patient Account Number: [id number] Date of Birth/Sex: Jul 31, 1929 (84 y.o. M) Treating RN: Huel Coventry Primary Care Provider: Guerry Bruin Other Clinician: Referring Provider: Guerry Bruin Treating Provider/Extender: Altamese [place] in Treatment: 10 Verbal / Phone Orders: No Diagnosis Coding Wound Cleansing Wound #5 Left,Medial Malleolus o Cleanse wound with mild soap and water - HHRN please wash legs Anesthetic (add to Medication List) Wound #5 Left,Medial Malleolus o Topical Lidocaine 4% cream applied to wound bed prior to debridement (In Clinic Only). Primary Wound Dressing Wound #5 Left,Medial Malleolus o Silver  Collagen Secondary Dressing Wound #5 Left,Medial Malleolus o ABD and Kerlix/Conform Dressing Change Frequency Wound #5 Left,Medial Malleolus o Change Dressing Monday, Wednesday, Friday Follow-up Appointments Wound #5 Left,Medial Malleolus o Return Appointment in 2 weeks. Edema Control Wound #5 Left,Medial Malleolus o Patient to wear own Velcro compression garment. o Elevate legs to the level of the heart and pump ankles as often as possible Additional Orders / Instructions Wound #5 Left,Medial Malleolus o Vitamin A; Vitamin C, Zinc o Activity as tolerated Home Health Wound #5 Left,Medial Malleolus o Continue Home Health Visits o Home Health Nurse may visit PRN to address patientos wound care needs. o FACE TO FACE ENCOUNTER: MEDICARE and MEDICAID PATIENTS: I certify that this patient is under my care and that I had a face-to-face encounter that meets the physician face-to-face encounter requirements with this patient on this date. The encounter with the patient was in whole or in part for the following MEDICAL JABEN, BENEGAS ([id number]) CONDITION: (primary reason for Home Healthcare) MEDICAL NECESSITY: I certify, that based on my findings, NURSING services are a medically necessary home health service. HOME BOUND STATUS: I certify that my clinical findings support that this patient is homebound (i.e., Due to illness or injury, pt requires aid of supportive devices such as crutches, cane, wheelchairs, walkers, the use of special transportation or the assistance of another person to leave their place of residence. There is a normal inability to leave the home and doing so requires considerable and taxing effort. Other absences are for medical reasons / religious services and are infrequent or of short duration when for other reasons). o If current dressing causes regression in wound condition, may D/C  ordered dressing product/s and apply Normal Saline Moist  Dressing daily until next Wound Healing Center / Other MD appointment. Notify Wound Healing Center of regression in wound condition at [phone]. o Please direct any NON-WOUND related issues/requests for orders to patient's Primary Care Physician Electronic Signature(s) Signed: 01/20/2018 4:50:03 PM By: Baltazar Najjar MD Signed: 01/20/2018 4:52:44 PM By: Elliot Gurney, [name], RN, CWS, [name], [name] Entered By: Elliot Gurney, [name], RN, CWS, Kim on 01/20/2018 15:52:05 [name] ([id number]) -------------------------------------------------------------------------------- Problem List Details Patient Name: KHALIB, FENDLEY C. Date of Service: 01/20/2018 3:15 PM Medical Record Number: [id number] Patient Account Number: [id number] Date of Birth/Sex: [birth year] (84 y.o. M) Treating RN: Huel Coventry Primary Care Provider: Guerry Bruin Other Clinician: Referring Provider: Guerry Bruin Treating Provider/Extender: Altamese [place] in Treatment: 10 Active Problems ICD-10 Impacting Encounter Code Description Active Date Wound Healing Diagnosis L97.211 Non-pressure chronic ulcer of right calf limited to breakdown 11/11/2017 Yes of skin L97.221 Non-pressure chronic ulcer of left calf limited to breakdown of 11/11/2017 Yes skin I87.333 Chronic venous hypertension (idiopathic) with ulcer and 11/11/2017 Yes inflammation of bilateral lower extremity Inactive Problems Resolved Problems Electronic Signature(s) Signed: 01/20/2018 4:50:03 PM By: Baltazar Najjar MD Entered By: Baltazar Najjar on 01/20/2018 16:08:21 [name] ([id number]) -------------------------------------------------------------------------------- Progress Note Details Patient Name: [name] Date of Service: 01/20/2018 3:15 PM Medical Record Number: [id number] Patient Account Number: [id number] Date of Birth/Sex: 26-Feb-1929 (84 y.o. M) Treating RN: Huel Coventry Primary Care Provider: Guerry Bruin Other  Clinician: Referring Provider: Guerry Bruin Treating Provider/Extender: Altamese [place] in Treatment: 10 Subjective History of Present Illness (HPI) Pleasant 84 year old with history of chronic venous insufficiency, remote history of DVT, atrial fibrillation (on Coumadin). No history of diabetes. He says that he developed worsening left lower extremity swelling, blisters, and subsequent ulcerations in late October 2016. He was started on Augmentin for left lower extremity cellulitis, which has improved. Arterial ultrasound 07/27/2015 showed no significant peripheral arterial disease. Awaiting venous ultrasound. Performing dressing changes with silver alginate and using a Tubigrip for edema control. He denies any significant pain. No claudication or rest pain. Ambulating per his baseline with a walker. No fever or chills. Moderate drainage. 10/10/15; the patient arrives back today having not been seen in almost 2 months. He has no open wounds on his legs. He has a history of venous insufficiency, arterial insufficiency. He has chronic Coumadin use secondary to atrial fibrillation and a remote history of DVT. Fortunately as he has not been here in 2 months we were not able to order him stockings. He is not able to get on standard compression hose. Juxtalite stockings would cost him probably about $100 appear, the couple was not able to afford this. 11/11/17 READMISSION This is an 84 year old man who is not a diabetic. He does have a history of sick sinus syndrome atrial fibrillation and has a cardiac pacemaker and is on Coumadin.he also has a history of DVT. His wife states that episodically over the last 2 years he has had wounds on his lower extremities but they have healed with home measures. Roughly 2-3 weeks ago the legs became a lot more swollen and he has had draining areas on the right anterior medial and lateral and the left medial leg. The patient's wife states that they were  told to put support stockings on him but they are concerned because they worsen the blisters. this bit difficult to get the history but they think the edema has been increasing over the last  several months. 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 He does not complain of chest pain orthopnea. He does not have a history of kidney issues that he is aware. Primary doctor at [facility]. He is not on diuretics. He was here 2 years ago. He was noted to have edema in his legs at that time. He had an ultrasound of his legs which I'll need to review. There was discussion about compression stockings but he is complaining clearly not using them. ABI in the right in this clinic was 1.37, 1.07 on the left 11/18/17; this is an elderly frail man who has bilateral small lower extremity wounds in the setting of very significant multifactorial edema. He probably has chronic venous insufficiency with lymphedema but it is clearly fluid overloaded last week. He apparently has started on diuretics through his primary physician at [facility] but the patient's wife is not sure which one but states that he goes to the bathroom every 15 minutes. He is tolerating compression wraps. His wife stated that they charged him full price stating that he didn't have open wounds on his legs that would cover wraparound stockings. His edema is much better controlled 11/25/17-he is here in follow-up evaluation for bilateral lower extremity ulcers. He is tolerating compression therapy. There was one episode since last appointment with copious amounts of drainage from the right medial malleolus. We will add additional absorptive dressing, otherwise no change in treatment plan and he will follow-up next week 12/02/17; since I last saw this man is edema control is a lot better and I think this is a combination of diuretic therapy and are 3 layer compression. He has severe bilateral  venous insufficiency with hemosiderin deposition and likely chronic stasis dermatitis/inflammation. In spite of the edema control he has several weeping areas on both legs including the left lateral leg, area on the right posterior leg. He also has an excoriated area just below his right tibial tuberosity. Fortuitously the patient has a follow-up with cardiology tomorrow 12/09/17; his edema control is excellent now on 40 mg of Lasix on his cardiologist last week and according to his wife was thoroughly evaluated including checking his pacemaker and no medication changes are made. He has good edema control in his lower legs. He has significant chronic venous insufficiency with hemosiderin physician as well as stasis dermatitis. major LARANCE, RATLEDGE ([id number]) areas on the medial malleoli 12/16/17; he has excellent edema control however he is still having a lot of drainage and a lot of discomfort on the medial part of his right ankle/calf. The only other wound of any substances on the left medial malleolus. He is not been systemically unwell. 12/23/17; he has excellent edema control however he still has weeping edema fluid coming out of the medial part of the right ankle. The exact cause of this is unclear I gave him antibiotics last week however I don't really think that this is made a lot of difference. The other wound is on the left medial malleolus. This required debridement 12/30/17; he has good edema control bilaterally. Still having some weeping edema coming up the medial part of the right ankle. I suspect he has chronic venous inflammation/raises dermatitis. Some component of lymphedema 01/06/18; patient has very good edema control bilaterally. We've been using 3 alert compression which he seems to tolerate. I suspect he has chronic venous inflammation stasis dermatitis and some component of lymphedema. He had 3 areas we are  following one on the right medial ankle, one on the left medial  malleolus and one on the left anterior leg. Using silver alginate 01/13/18; patient we changed to silver collagen last week. He is been using his own juxta light stockings. He comes in today his edema is well controlled.. Still some areas on the left anterior leg but everything on the right medial ankle looks closed. His wife is asking questions about his Lasix how long he needs to be on this etc. etc. doesn't look like he's had a lot of follow-up on his electrolytes and I've ordered a basic metabolic panel 01/20/18; patient has been using his own juxta light stockings. He has excellent edema control. There is no open area on the right leg. He has a small open area on the left medial malleolus which is all that is left of the extensive bilateral wounds he had. As mentioned the edema control is excellent. Objective Constitutional Sitting or standing Blood Pressure is within target range for patient.. Pulse regular and within target range for patient.Marland Kitchen Respirations regular, non-labored and within target range.. Temperature is normal and within the target range for the patient.Marland Kitchen appears in no distress.frail elderly man. Vitals Time Taken: 3:28 PM, Height: 68 in, Weight: 150 lbs, BMI: 22.8, Temperature: 98.0 F, Pulse: 69 bpm, Respiratory Rate: 16 breaths/min, Blood Pressure: 100/56 mmHg. Cardiovascular pedal pulses are palpable bilaterally. Edema is very well controlled. Psychiatric No evidence of depression, anxiety, or agitation. Calm, cooperative, and communicative. Appropriate interactions and affect.. General Notes: wound exam; there is no open area on the right medial ankle of the right leg itself. Edema control is excellent he has severe hemosiderin deposition. She has a small open area on the left medial malleolus. This looks like it's progressing towards closure. No debridement was necessary. He has severe chronic venous insufficiency but his edema is much better controlled combination of  juxta light stockings and Lasix Integumentary (Hair, Skin) chronic venous insufficiency but no other predominant skin problems. Wound #14 status is Healed - Epithelialized. Original cause of wound was Shear/Friction. The wound is located on the Left,Lateral Lower Leg. The wound measures 0cm length x 0cm width x 0cm depth; 0cm^2 area and 0cm^3 volume. Wound #15 status is Healed - Epithelialized. Original cause of wound was Shear/Friction. The wound is located on the Left,Medial Lower Leg. The wound measures 0cm length x 0cm width x 0cm depth; 0cm^2 area and 0cm^3 volume. There is Maser, Ikechukwu C. ([id number]) no tunneling or undermining noted. There is a none present amount of drainage noted. The wound margin is flat and intact. There is no granulation within the wound bed. There is no necrotic tissue within the wound bed. The periwound skin appearance exhibited: Excoriation. The periwound skin appearance did not exhibit: Callus, Crepitus, Induration, Rash, Scarring, Dry/Scaly, Maceration, Atrophie Blanche, Cyanosis, Ecchymosis, Hemosiderin Staining, Mottled, Pallor, Rubor, Erythema. Periwound temperature was noted as No Abnormality. The periwound has tenderness on palpation. Wound #5 status is Open. Original cause of wound was Blister. The wound is located on the Left,Medial Malleolus. The wound measures 0.2cm length x 0.2cm width x 0.1cm depth; 0.031cm^2 area and 0.003cm^3 volume. There is no tunneling or undermining noted. There is a none present amount of drainage noted. The wound margin is flat and intact. There is no granulation within the wound bed. There is a large (67-100%) amount of necrotic tissue within the wound bed including Eschar. Assessment Active Problems ICD-10 L97.211 - Non-pressure chronic ulcer of right calf limited to  breakdown of skin L97.221 - Non-pressure chronic ulcer of left calf limited to breakdown of skin I87.333 - Chronic venous hypertension (idiopathic) with  ulcer and inflammation of bilateral lower extremity Plan Wound Cleansing: Wound #5 Left,Medial Malleolus: Cleanse wound with mild soap and water - HHRN please wash legs Anesthetic (add to Medication List): Wound #5 Left,Medial Malleolus: Topical Lidocaine 4% cream applied to wound bed prior to debridement (In Clinic Only). Primary Wound Dressing: Wound #5 Left,Medial Malleolus: Silver Collagen Secondary Dressing: Wound #5 Left,Medial Malleolus: ABD and Kerlix/Conform Dressing Change Frequency: Wound #5 Left,Medial Malleolus: Change Dressing Monday, Wednesday, Friday Follow-up Appointments: Wound #5 Left,Medial Malleolus: Return Appointment in 2 weeks. Edema Control: Wound #5 Left,Medial Malleolus: Patient to wear own Velcro compression garment. Elevate legs to the level of the heart and pump ankles as often as possible Additional Orders / Instructions: Wound #5 Left,Medial Malleolus: Vitamin A; Vitamin C, Zinc Activity as tolerated Home Health: BURHAN, BARHAM ([id number]) Wound #5 Left,Medial Malleolus: Continue Home Health Visits Home Health Nurse may visit PRN to address patient s wound care needs. FACE TO FACE ENCOUNTER: MEDICARE and MEDICAID PATIENTS: I certify that this patient is under my care and that I had a face-to-face encounter that meets the physician face-to-face encounter requirements with this patient on this date. The encounter with the patient was in whole or in part for the following MEDICAL CONDITION: (primary reason for Home Healthcare) MEDICAL NECESSITY: I certify, that based on my findings, NURSING services are a medically necessary home health service. HOME BOUND STATUS: I certify that my clinical findings support that this patient is homebound (i.e., Due to illness or injury, pt requires aid of supportive devices such as crutches, cane, wheelchairs, walkers, the use of special transportation or the assistance of another person to leave their place of  residence. There is a normal inability to leave the home and doing so requires considerable and taxing effort. Other absences are for medical reasons / religious services and are infrequent or of short duration when for other reasons). If current dressing causes regression in wound condition, may D/C ordered dressing product/s and apply Normal Saline Moist Dressing daily until next Wound Healing Center / Other MD appointment. Notify Wound Healing Center of regression in wound condition at [phone]. Please direct any NON-WOUND related issues/requests for orders to patient's Primary Care Physician #1 it was so this man is progressing towards closure. #2 using Silver collagen to the wound surface border foam in his own stocking. #3 he'll be back in clinic in 2 weeks I'm hoping to see him heal by that point Electronic Signature(s) Signed: 01/20/2018 4:50:03 PM By: Baltazar Najjar MD Entered By: Baltazar Najjar on 01/20/2018 16:19:33 Reaver, [name] ([id number]) -------------------------------------------------------------------------------- SuperBill Details Patient Name: [name] Date of Service: 01/20/2018 Medical Record Number: [id number] Patient Account Number: [id number] Date of Birth/Sex: 1929-03-18 (84 y.o. M) Treating RN: Huel Coventry Primary Care Provider: Guerry Bruin Other Clinician: Referring Provider: Guerry Bruin Treating Provider/Extender: [place] in Treatment: 10 Diagnosis Coding ICD-10 Codes Code Description [phone] Non-pressure chronic ulcer of right calf limited to breakdown of skin L97.221 Non-pressure chronic ulcer of left calf limited to breakdown of skin I87.333 Chronic venous hypertension (idiopathic) with ulcer and inflammation of bilateral lower extremity Facility Procedures CPT4 Code: [id number] Description: [phone] - WOUND CARE VISIT-LEV 2 EST PT Modifier: Quantity: 1 Physician Procedures CPT4 Code: 4332951 Description:  99213 - WC PHYS LEVEL 3 - EST PT ICD-10 Diagnosis Description L97.211 Non-pressure chronic ulcer of  right calf limited to breakdo L97.221 Non-pressure chronic ulcer of left calf limited to breakdow Modifier: wn of skin n of skin Quantity: 1 Electronic Signature(s) Signed: 01/20/2018 4:50:03 PM By: Baltazar Najjar MD Entered By: Baltazar Najjar on 01/20/2018 16:20:50

## 2018-01-22 NOTE — Progress Notes (Signed)
[name], [name] ([id number]) Visit Report for 01/20/2018 Arrival Information Details Patient Name: [name]. Date of Service: 01/20/2018 3:15 PM Medical Record Number: [id number] Patient Account Number: [id number] Date of Birth/Sex: 07/10/1929 (84 y.o. M) Treating RN: Phillis Haggis Primary Care Taurus Alamo: Guerry Bruin Other Clinician: Referring Jaylianna Tatlock: Guerry Bruin Treating Concettina Leth/Extender: Altamese [place] in Treatment: 10 Visit Information History Since Last Visit All ordered tests and consults were completed: No Patient Arrived: Wheel Chair Added or deleted any medications: No Arrival Time: 15:25 Any new allergies or adverse reactions: No Accompanied By: wife Had a fall or experienced change in No Transfer Assistance: EasyPivot Patient activities of daily living that may affect Lift risk of falls: Patient Identification Verified: Yes Signs or symptoms of abuse/neglect since last visito No Secondary Verification Process Yes Hospitalized since last visit: No Completed: Implantable device outside of the clinic excluding No Patient Requires Transmission-Based No cellular tissue based products placed in the center Precautions: since last visit: Patient Has Alerts: Yes Has Dressing in Place as Prescribed: Yes Patient Alerts: Patient on Blood Has Compression in Place as Prescribed: Yes Thinner Warfarin Pain Present Now: No Pacemaker Electronic Signature(s) Signed: 01/20/2018 4:20:38 PM By: Alejandro Mulling Entered By: Alejandro Mulling on 01/20/2018 15:26:21 [name] ([id number]) -------------------------------------------------------------------------------- Clinic Level of Care Assessment Details Patient Name: [name]. Date of Service: 01/20/2018 3:15 PM Medical Record Number: [id number] Patient Account Number: [id number] Date of Birth/Sex: 29-Jan-1929 (84 y.o. M) Treating RN: Huel Coventry Primary Care Dacey Milberger: Guerry Bruin Other Clinician: Referring Lorita Forinash: Guerry Bruin Treating Ricca Melgarejo/Extender: Altamese [place] in Treatment: 10 Clinic Level of Care Assessment Items TOOL 4 Quantity Score  - Use when only an EandM is performed on FOLLOW-UP visit 0 ASSESSMENTS - Nursing Assessment / Reassessment  - Reassessment of Co-morbidities (includes updates in patient status) 0 X- 1 5 Reassessment of Adherence to Treatment Plan ASSESSMENTS - Wound and Skin Assessment / Reassessment X - Simple Wound Assessment / Reassessment - one wound 1 5  - 0 Complex Wound Assessment / Reassessment - multiple wounds  - 0 Dermatologic / Skin Assessment (not related to wound area) ASSESSMENTS - Focused Assessment  - Circumferential Edema Measurements - multi extremities 0  - 0 Nutritional Assessment / Counseling / Intervention  - 0 Lower Extremity Assessment (monofilament, tuning fork, pulses)  - 0 Peripheral Arterial Disease Assessment (using hand held doppler) ASSESSMENTS - Ostomy and/or Continence Assessment and Care  - Incontinence Assessment and Management 0  - 0 Ostomy Care Assessment and Management (repouching, etc.) PROCESS - Coordination of Care X - Simple Patient / Family Education for ongoing care 1 15  - 0 Complex (extensive) Patient / Family Education for ongoing care  - 0 Staff obtains Chiropractor, Records, Test Results / Process Orders  - 0 Staff telephones HHA, Nursing Homes / Clarify orders / etc  - 0 Routine Transfer to another Facility (non-emergent condition)  - 0 Routine Hospital Admission (non-emergent condition)  - 0 New Admissions / Manufacturing engineer / Ordering NPWT, Apligraf, etc.  - 0 Emergency Hospital Admission (emergent condition) X- 1 10 Simple Discharge Coordination [name], [name] ([id number])  - 0 Complex (extensive) Discharge Coordination PROCESS - Special Needs  - Pediatric / Minor Patient Management 0  -  0 Isolation Patient Management  - 0 Hearing / Language / Visual special needs  - 0 Assessment of Community assistance (transportation, D/C planning, etc.)  - 0 Additional assistance / Altered mentation  - 0 Support Surface(s) Assessment (  bed, cushion, seat, etc.) INTERVENTIONS - Wound Cleansing / Measurement X - Simple Wound Cleansing - one wound 1 5  - 0 Complex Wound Cleansing - multiple wounds X- 1 5 Wound Imaging (photographs - any number of wounds)  - 0 Wound Tracing (instead of photographs) X- 1 5 Simple Wound Measurement - one wound  - 0 Complex Wound Measurement - multiple wounds INTERVENTIONS - Wound Dressings X - Small Wound Dressing one or multiple wounds 1 10  - 0 Medium Wound Dressing one or multiple wounds  - 0 Large Wound Dressing one or multiple wounds  - 0 Application of Medications - topical  - 0 Application of Medications - injection INTERVENTIONS - Miscellaneous  - External ear exam 0  - 0 Specimen Collection (cultures, biopsies, blood, body fluids, etc.)  - 0 Specimen(s) / Culture(s) sent or taken to Lab for analysis  - 0 Patient Transfer (multiple staff / Nurse, adult / Similar devices)  - 0 Simple Staple / Suture removal (25 or less)  - 0 Complex Staple / Suture removal (26 or more)  - 0 Hypo / Hyperglycemic Management (close monitor of Blood Glucose)  - 0 Ankle / Brachial Index (ABI) - do not check if billed separately X- 1 5 Vital Signs [name], [name] ([id number]) Has the patient been seen at the hospital within the last three years: Yes Total Score: 65 Level Of Care: New/Established - Level 2 Electronic Signature(s) Signed: 01/20/2018 4:52:44 PM By: Elliot Gurney, [name], RN, CWS, [name], [name] Entered By: Elliot Gurney, [name], RN, CWS, Kim on 01/20/2018 15:52:55 [name] ([id number]) -------------------------------------------------------------------------------- Encounter Discharge Information  Details Patient Name: Donald Musca C. Date of Service: 01/20/2018 3:15 PM Medical Record Number: [id number] Patient Account Number: [id number] Date of Birth/Sex: [birth year] (84 y.o. M) Treating RN: Curtis Sites Primary Care Kaly Mcquary: Guerry Bruin Other Clinician: Referring Josephus Harriger: Guerry Bruin Treating Van [place]/Extender: Altamese [place] in Treatment: 10 Encounter Discharge Information Items Discharge Condition: Stable Ambulatory Status: Wheelchair Discharge Destination: Home Transportation: Private Auto Accompanied By: spouse Schedule Follow-up Appointment: Yes Clinical Summary of Care: Electronic Signature(s) Signed: 01/20/2018 4:28:11 PM By: Curtis Sites Entered By: Curtis Sites on 01/20/2018 16:28:11 [name] ([id number]) -------------------------------------------------------------------------------- Lower Extremity Assessment Details Patient Name: Donald Musca C. Date of Service: 01/20/2018 3:15 PM Medical Record Number: [id number] Patient Account Number: [id number] Date of Birth/Sex: [birth year] (84 y.o. M) Treating RN: Phillis Haggis Primary Care Corday Wyka: Guerry Bruin Other Clinician: Referring Avner Stroder: Guerry Bruin Treating Sephira Zellman/Extender: Altamese [place] in Treatment: 10 Edema Assessment Assessed: [Left: No] [Right: No] [Left: Edema] [Right: :] Calf Left: Right: Point of Measurement: 33 cm From Medial Instep 29.1 cm 29.2 cm Ankle Left: Right: Point of Measurement: 12 cm From Medial Instep 20.1 cm 20.5 cm Vascular Assessment Pulses: Dorsalis Pedis Palpable: [Left:Yes] [Right:Yes] Posterior Tibial Extremity colors, hair growth, and conditions: Extremity Color: [Left:Hyperpigmented] [Right:Hyperpigmented] Temperature of Extremity: [Left:Warm] [Right:Warm] Capillary Refill: [Left:< 3 seconds] [Right:> 3 seconds] Toe Nail Assessment Left: Right: Thick: Yes Yes Discolored: Yes Yes Deformed: Yes  Yes Improper Length and Hygiene: Yes Yes Electronic Signature(s) Signed: 01/20/2018 4:20:38 PM By: Alejandro Mulling Entered By: Alejandro Mulling on 01/20/2018 15:38:38 [name], [name] ([id number]) -------------------------------------------------------------------------------- Multi Wound Chart Details Patient Name: Donald Musca C. Date of Service: 01/20/2018 3:15 PM Medical Record Number: [id number] Patient Account Number: [id number] Date of Birth/Sex: Nov 28, 1928 (84 y.o. M) Treating RN: Huel Coventry Primary Care Trevontae Lindahl: Guerry Bruin Other Clinician: Referring Noele Icenhour: Guerry Bruin Treating Garrit Marrow/Extender: Altamese [place] in Treatment:  10 Vital Signs Height(in): 68 Pulse(bpm): 69 Weight(lbs): 150 Blood Pressure(mmHg): 100/56 Body Mass Index(BMI): 23 Temperature(F): 98.0 Respiratory Rate 16 (breaths/min): Photos: [14:No Photos] [15:No Photos] Wound Location: [14:Left, Lateral Lower Leg] [15:Left Lower Leg - Medial] Wounding Event: [14:Shear/Friction] [15:Shear/Friction] Primary Etiology: [14:Skin Tear] [15:Trauma, Other] Comorbid History: [14:N/A] [15:Cataracts, Arrhythmia, Deep Vein Thrombosis, Osteoarthritis] Date Acquired: [14:01/06/2018] [15:01/06/2018] Weeks of Treatment: [14:1] [15:1] Wound Status: [14:Healed - Epithelialized] [15:Healed - Epithelialized] Clustered Wound: [14:Yes] [15:Yes] Clustered Quantity: [14:N/A] [15:4] Measurements L x W x D [14:0x0x0] [15:0x0x0] (cm) Area (cm) : [14:0] [15:0] Volume (cm) : [14:0] [15:0] % Reduction in Area: [14:100.00%] [15:100.00%] % Reduction in Volume: [14:100.00%] [15:100.00%] Classification: [14:Partial Thickness] [15:Partial Thickness] Exudate Amount: [14:N/A] [15:None Present] Wound Margin: [14:N/A] [15:Flat and Intact] Granulation Amount: [14:N/A] [15:None Present (0%)] Necrotic Amount: [14:N/A] [15:None Present (0%)] Necrotic Tissue: [14:N/A] [15:N/A] Epithelialization: [14:N/A]  [15:None] Periwound Skin Texture: [14:No Abnormalities Noted] [15:Excoriation: Yes Induration: No Callus: No Crepitus: No Rash: No Scarring: No] Periwound Skin Moisture: [14:No Abnormalities Noted] [15:Maceration: No Dry/Scaly: No] Periwound Skin Color: [14:No Abnormalities Noted] [15:Atrophie Blanche: No Cyanosis: No Ecchymosis: No] Erythema: No Hemosiderin Staining: No Mottled: No Pallor: No Rubor: No Temperature: N/A No Abnormality N/A Tenderness on Palpation: No Yes No Wound Preparation: N/A Ulcer Cleansing: Ulcer Cleansing: Rinsed/Irrigated with Saline Rinsed/Irrigated with Saline Topical Anesthetic Applied: Topical Anesthetic Applied: None Other: lidocaine 4% Treatment Notes Electronic Signature(s) Signed: 01/20/2018 4:50:03 PM By: Baltazar Najjar MD Entered By: Baltazar Najjar on 01/20/2018 16:08:31 [name] ([id number]) -------------------------------------------------------------------------------- Multi-Disciplinary Care Plan Details Patient Name: [name]. Date of Service: 01/20/2018 3:15 PM Medical Record Number: [id number] Patient Account Number: [id number] Date of Birth/Sex: 03-16-1929 (84 y.o. M) Treating RN: Huel Coventry Primary Care Karey Suthers: Guerry Bruin Other Clinician: Referring Roe Wilner: Guerry Bruin Treating Moss Berry/Extender: Altamese [place] in Treatment: 10 Active Inactive ` Abuse / Safety / Falls / Self Care Management Nursing Diagnoses: Impaired physical mobility Potential for falls Goals: Patient will not develop complications from immobility Date Initiated: 11/11/2017 Target Resolution Date: 12/09/2017 Goal Status: Active Patient will remain injury free related to falls Date Initiated: 11/11/2017 Target Resolution Date: 12/09/2017 Goal Status: Active Interventions: Assess fall risk on admission and as needed Notes: ` Nutrition Nursing Diagnoses: Potential for alteratiion in Nutrition/Potential for imbalanced  nutrition Goals: Patient/caregiver agrees to and verbalizes understanding of need to use nutritional supplements and/or vitamins as prescribed Date Initiated: 11/11/2017 Target Resolution Date: 12/10/2017 Goal Status: Active Interventions: Assess patient nutrition upon admission and as needed per policy Notes: ` Orientation to the Wound Care Program Nursing Diagnoses: Knowledge deficit related to the wound healing center program Goals: Patient/caregiver will verbalize understanding of the Wound Healing Center [address] [place], [place]UL[place]PPER ([id number]) Date Initiated: 11/11/2017 Target Resolution Date: 12/10/2017 Goal Status: Active Interventions: Provide education on orientation to the wound center Notes: ` Venous Leg Ulcer Nursing Diagnoses: Actual venous Insuffiency (use after diagnosis is confirmed) Goals: Non-invasive venous studies are completed as ordered Date Initiated: 11/11/2017 Target Resolution Date: 12/10/2017 Goal Status: Active Interventions: Assess peripheral edema status every visit. Treatment Activities: Non-invasive vascular studies : 11/11/2017 Notes: ` Wound/Skin Impairment Nursing Diagnoses: Impaired tissue integrity Goals: Ulcer/skin breakdown will have a volume reduction of 80% by week 12 Date Initiated: 11/11/2017 Target Resolution Date: 12/10/2017 Goal Status: Active Interventions: Assess patient/caregiver ability to obtain necessary supplies Provide education on ulcer and skin care Treatment Activities: Topical wound management initiated : 11/11/2017 Notes: Electronic Signature(s) Signed: 01/20/2018 4:52:44 PM By: Elliot Gurney, [name], RN, CWS, [name], [name]  Entered By: Elliot Gurney, [name], RN, CWS, Kim on 01/20/2018 15:50:35 [name] ([id number]) -------------------------------------------------------------------------------- Pain Assessment Details Patient Name: Donald Musca C. Date of Service: 01/20/2018 3:15 PM Medical Record Number: [id number] Patient Account  Number: [id number] Date of Birth/Sex: 08-11-1929 (84 y.o. M) Treating RN: Phillis Haggis Primary Care Hanne Kegg: Guerry Bruin Other Clinician: Referring Kaelob Persky: Guerry Bruin Treating Alyissa Whidbee/Extender: Altamese [place] in Treatment: 10 Active Problems Location of Pain Severity and Description of Pain Patient Has Paino No Site Locations Pain Management and Medication Current Pain Management: Electronic Signature(s) Signed: 01/20/2018 4:20:38 PM By: Alejandro Mulling Entered By: Alejandro Mulling on 01/20/2018 15:28:03 [name] ([id number]) -------------------------------------------------------------------------------- Patient/Caregiver Education Details Patient Name: Donald Musca C. Date of Service: 01/20/2018 3:15 PM Medical Record Number: [id number] Patient Account Number: [id number] Date of Birth/Gender: 1928-12-07 (84 y.o. M) Treating RN: Curtis Sites Primary Care Physician: Guerry Bruin Other Clinician: Referring Physician: Guerry Bruin Treating Physician/Extender: Altamese [place] in Treatment: 10 Education Assessment Education Provided To: Patient and Caregiver Education Topics Provided Wound/Skin Impairment: Handouts: Other: wound care as ordered Methods: Demonstration, Explain/Verbal Responses: State content correctly Electronic Signature(s) Signed: 01/20/2018 4:31:04 PM By: Curtis Sites Entered By: Curtis Sites on 01/20/2018 16:28:35 [name] ([id number]) -------------------------------------------------------------------------------- Wound Assessment Details Patient Name: Donald Musca C. Date of Service: 01/20/2018 3:15 PM Medical Record Number: [id number] Patient Account Number: [id number] Date of Birth/Sex: May 27, 1929 (84 y.o. M) Treating RN: Phillis Haggis Primary Care Jadine Brumley: Guerry Bruin Other Clinician: Referring Amontae Ng: Guerry Bruin Treating Tiaria Biby/Extender: Altamese [place] in Treatment: 10 Wound Status Wound Number: 14 Primary Etiology: Skin Tear Wound Location: Left, Lateral Lower Leg Wound Status: Healed - Epithelialized Wounding Event: Shear/Friction Date Acquired: 01/06/2018 Weeks Of Treatment: 1 Clustered Wound: Yes Wound Measurements Length: (cm) 0 Width: (cm) 0 Depth: (cm) 0 Area: (cm) 0 Volume: (cm) 0 % Reduction in Area: 100% % Reduction in Volume: 100% Wound Description Classification: Partial Thickness Periwound Skin Texture Texture Color No Abnormalities Noted: No No Abnormalities Noted: No Moisture No Abnormalities Noted: No Electronic Signature(s) Signed: 01/20/2018 4:20:38 PM By: Alejandro Mulling Entered By: Alejandro Mulling on 01/20/2018 15:34:41 Kenna, [name] ([id number]) -------------------------------------------------------------------------------- Wound Assessment Details Patient Name: Donald Musca C. Date of Service: 01/20/2018 3:15 PM Medical Record Number: [id number] Patient Account Number: [id number] Date of Birth/Sex: 28-Nov-1928 (84 y.o. M) Treating RN: Phillis Haggis Primary Care Nuh Lipton: Guerry Bruin Other Clinician: Referring Estrella Alcaraz: Guerry Bruin Treating Ane Conerly/Extender: Altamese [place] in Treatment: 10 Wound Status Wound Number: 15 Primary Trauma, Other Etiology: Wound Location: Left Lower Leg - Medial Wound Status: Healed - Epithelialized Wounding Event: Shear/Friction Comorbid Cataracts, Arrhythmia, Deep Vein Date Acquired: 01/06/2018 History: Thrombosis, Osteoarthritis Weeks Of Treatment: 1 Clustered Wound: Yes Wound Measurements Length: (cm) 0 % Width: (cm) 0 % Depth: (cm) 0 Ep Clustered Quantity: 4 Tu Area: (cm) 0 U Volume: (cm) 0 Reduction in Area: 100% Reduction in Volume: 100% ithelialization: None nneling: No ndermining: No Wound Description Classification: Partial Thickness Fo Wound Margin: Flat and Intact Sl Exudate Amount: None  Present ul Odor After Cleansing: No ough/Fibrino No Wound Bed Granulation Amount: None Present (0%) Exposed Structure Necrotic Amount: None Present (0%) Fascia Exposed: No Fat Layer (Subcutaneous Tissue) Exposed: No Tendon Exposed: No Muscle Exposed: No Joint Exposed: No Bone Exposed: No Periwound Skin Texture Texture Color No Abnormalities Noted: No No Abnormalities Noted: No Callus: No Atrophie Blanche: No Crepitus: No Cyanosis: No Excoriation: Yes Ecchymosis: No Induration: No Erythema: No Rash: No Hemosiderin Staining: No Scarring:  No Mottled: No Pallor: No Moisture Rubor: No No Abnormalities Noted: No Dry / Scaly: No Temperature / Pain Maceration: No Temperature: No Abnormality Tenderness on Palpation: Yes [name], [name] ([id number]) Wound Preparation Ulcer Cleansing: Rinsed/Irrigated with Saline Topical Anesthetic Applied: None Electronic Signature(s) Signed: 01/20/2018 4:20:38 PM By: Alejandro Mulling Entered By: Alejandro Mulling on 01/20/2018 15:36:18 [name], [name] ([id number]) -------------------------------------------------------------------------------- Wound Assessment Details Patient Name: Donald Musca C. Date of Service: 01/20/2018 3:15 PM Medical Record Number: [id number] Patient Account Number: [id number] Date of Birth/Sex: 1929/06/30 (84 y.o. M) Treating RN: Phillis Haggis Primary Care Kirtan Sada: Guerry Bruin Other Clinician: Referring Breunna Nordmann: Guerry Bruin Treating Eulalah Rupert/Extender: Altamese [place] in Treatment: 10 Wound Status Wound Number: 5 Primary Venous Leg Ulcer Etiology: Wound Location: Left Malleolus - Medial Wound Status: Open Wounding Event: Blister Comorbid Cataracts, Arrhythmia, Deep Vein Date Acquired: 10/28/2017 History: Thrombosis, Osteoarthritis Weeks Of Treatment: 10 Clustered Wound: No Wound Measurements Length: (cm) 0.2 Width: (cm) 0.2 Depth: (cm) 0.1 Area: (cm) 0.031 Volume: (cm)  0.003 % Reduction in Area: 86.9% % Reduction in Volume: 87.5% Tunneling: No Undermining: No Wound Description Full Thickness Without Exposed Support Classification: Structures Wound Margin: Flat and Intact Exudate None Present Amount: Foul Odor After Cleansing: No Slough/Fibrino Yes Wound Bed Granulation Amount: None Present (0%) Necrotic Amount: Large (67-100%) Necrotic Quality: Eschar Periwound Skin Texture Texture Color No Abnormalities Noted: No No Abnormalities Noted: No Moisture No Abnormalities Noted: No Wound Preparation Ulcer Cleansing: Rinsed/Irrigated with Saline Topical Anesthetic Applied: Other: lidocaine 4%, Treatment Notes Wound #5 (Left, Medial Malleolus) 1. Cleansed with: Clean wound with Normal Saline 2. Anesthetic Topical Lidocaine 4% cream to wound bed prior to debridement 4. Dressing Applied: Rashee Marschall, [name] ([id number]) 5. Secondary Dressing Applied ABD Pad Dry Gauze Kerlix/Conform Notes juxtalite wraps bilateral with compression stockings Electronic Signature(s) Signed: 01/20/2018 4:20:38 PM By: Alejandro Mulling Entered By: Alejandro Mulling on 01/20/2018 15:34:11 [name] ([id number]) -------------------------------------------------------------------------------- Vitals Details Patient Name: Donald Musca C. Date of Service: 01/20/2018 3:15 PM Medical Record Number: [id number] Patient Account Number: [id number] Date of Birth/Sex: 07-06-1929 (84 y.o. M) Treating RN: Phillis Haggis Primary Care Fitzgerald Dunne: Guerry Bruin Other Clinician: Referring Lizanne Erker: Guerry Bruin Treating Devonne Lalani/Extender: Altamese [place] in Treatment: 10 Vital Signs Time Taken: 15:28 Temperature (F): 98.0 Height (in): 68 Pulse (bpm): 69 Weight (lbs): 150 Respiratory Rate (breaths/min): 16 Body Mass Index (BMI): 22.8 Blood Pressure (mmHg): 100/56 Reference Range: 80 - 120 mg / dl Electronic Signature(s) Signed:  01/20/2018 4:20:38 PM By: Alejandro Mulling Entered By: Alejandro Mulling on 01/20/2018 15:28:40

## 2018-01-23 DIAGNOSIS — I4891 Unspecified atrial fibrillation: Secondary | ICD-10-CM | POA: Diagnosis not present

## 2018-01-23 DIAGNOSIS — I87333 Chronic venous hypertension (idiopathic) with ulcer and inflammation of bilateral lower extremity: Secondary | ICD-10-CM | POA: Diagnosis not present

## 2018-01-23 DIAGNOSIS — I10 Essential (primary) hypertension: Secondary | ICD-10-CM | POA: Diagnosis not present

## 2018-01-23 DIAGNOSIS — L97321 Non-pressure chronic ulcer of left ankle limited to breakdown of skin: Secondary | ICD-10-CM | POA: Diagnosis not present

## 2018-01-23 DIAGNOSIS — L97211 Non-pressure chronic ulcer of right calf limited to breakdown of skin: Secondary | ICD-10-CM | POA: Diagnosis not present

## 2018-01-23 DIAGNOSIS — M1990 Unspecified osteoarthritis, unspecified site: Secondary | ICD-10-CM | POA: Diagnosis not present

## 2018-01-25 DIAGNOSIS — I87333 Chronic venous hypertension (idiopathic) with ulcer and inflammation of bilateral lower extremity: Secondary | ICD-10-CM | POA: Diagnosis not present

## 2018-01-25 DIAGNOSIS — I4891 Unspecified atrial fibrillation: Secondary | ICD-10-CM | POA: Diagnosis not present

## 2018-01-25 DIAGNOSIS — L97211 Non-pressure chronic ulcer of right calf limited to breakdown of skin: Secondary | ICD-10-CM | POA: Diagnosis not present

## 2018-01-25 DIAGNOSIS — L97321 Non-pressure chronic ulcer of left ankle limited to breakdown of skin: Secondary | ICD-10-CM | POA: Diagnosis not present

## 2018-01-25 DIAGNOSIS — I10 Essential (primary) hypertension: Secondary | ICD-10-CM | POA: Diagnosis not present

## 2018-01-25 DIAGNOSIS — M1990 Unspecified osteoarthritis, unspecified site: Secondary | ICD-10-CM | POA: Diagnosis not present

## 2018-01-27 DIAGNOSIS — M1990 Unspecified osteoarthritis, unspecified site: Secondary | ICD-10-CM | POA: Diagnosis not present

## 2018-01-27 DIAGNOSIS — I4891 Unspecified atrial fibrillation: Secondary | ICD-10-CM | POA: Diagnosis not present

## 2018-01-27 DIAGNOSIS — I10 Essential (primary) hypertension: Secondary | ICD-10-CM | POA: Diagnosis not present

## 2018-01-27 DIAGNOSIS — L97211 Non-pressure chronic ulcer of right calf limited to breakdown of skin: Secondary | ICD-10-CM | POA: Diagnosis not present

## 2018-01-27 DIAGNOSIS — I87333 Chronic venous hypertension (idiopathic) with ulcer and inflammation of bilateral lower extremity: Secondary | ICD-10-CM | POA: Diagnosis not present

## 2018-01-27 DIAGNOSIS — L97321 Non-pressure chronic ulcer of left ankle limited to breakdown of skin: Secondary | ICD-10-CM | POA: Diagnosis not present

## 2018-02-01 DIAGNOSIS — L97211 Non-pressure chronic ulcer of right calf limited to breakdown of skin: Secondary | ICD-10-CM | POA: Diagnosis not present

## 2018-02-01 DIAGNOSIS — I87333 Chronic venous hypertension (idiopathic) with ulcer and inflammation of bilateral lower extremity: Secondary | ICD-10-CM | POA: Diagnosis not present

## 2018-02-01 DIAGNOSIS — I4891 Unspecified atrial fibrillation: Secondary | ICD-10-CM | POA: Diagnosis not present

## 2018-02-01 DIAGNOSIS — I10 Essential (primary) hypertension: Secondary | ICD-10-CM | POA: Diagnosis not present

## 2018-02-01 DIAGNOSIS — L97321 Non-pressure chronic ulcer of left ankle limited to breakdown of skin: Secondary | ICD-10-CM | POA: Diagnosis not present

## 2018-02-01 DIAGNOSIS — M1990 Unspecified osteoarthritis, unspecified site: Secondary | ICD-10-CM | POA: Diagnosis not present

## 2018-02-03 ENCOUNTER — Encounter: Payer: Medicare Other | Admitting: Internal Medicine

## 2018-02-03 DIAGNOSIS — Z09 Encounter for follow-up examination after completed treatment for conditions other than malignant neoplasm: Secondary | ICD-10-CM | POA: Diagnosis not present

## 2018-02-03 DIAGNOSIS — Z86718 Personal history of other venous thrombosis and embolism: Secondary | ICD-10-CM | POA: Diagnosis not present

## 2018-02-03 DIAGNOSIS — I4891 Unspecified atrial fibrillation: Secondary | ICD-10-CM | POA: Diagnosis not present

## 2018-02-03 DIAGNOSIS — L97329 Non-pressure chronic ulcer of left ankle with unspecified severity: Secondary | ICD-10-CM | POA: Diagnosis not present

## 2018-02-03 DIAGNOSIS — I872 Venous insufficiency (chronic) (peripheral): Secondary | ICD-10-CM | POA: Diagnosis not present

## 2018-02-03 DIAGNOSIS — Z7901 Long term (current) use of anticoagulants: Secondary | ICD-10-CM | POA: Diagnosis not present

## 2018-02-03 DIAGNOSIS — R609 Edema, unspecified: Secondary | ICD-10-CM | POA: Diagnosis not present

## 2018-02-05 DIAGNOSIS — I10 Essential (primary) hypertension: Secondary | ICD-10-CM | POA: Diagnosis not present

## 2018-02-05 DIAGNOSIS — L97211 Non-pressure chronic ulcer of right calf limited to breakdown of skin: Secondary | ICD-10-CM | POA: Diagnosis not present

## 2018-02-05 DIAGNOSIS — I4891 Unspecified atrial fibrillation: Secondary | ICD-10-CM | POA: Diagnosis not present

## 2018-02-05 DIAGNOSIS — L97321 Non-pressure chronic ulcer of left ankle limited to breakdown of skin: Secondary | ICD-10-CM | POA: Diagnosis not present

## 2018-02-05 DIAGNOSIS — M1990 Unspecified osteoarthritis, unspecified site: Secondary | ICD-10-CM | POA: Diagnosis not present

## 2018-02-05 DIAGNOSIS — I87333 Chronic venous hypertension (idiopathic) with ulcer and inflammation of bilateral lower extremity: Secondary | ICD-10-CM | POA: Diagnosis not present

## 2018-02-05 NOTE — Progress Notes (Signed)
CLAUDIA, ALVIZO ([id number]) Visit Report for 02/03/2018 HPI Details Patient Name: [name], [name]. Date of Service: 02/03/2018 3:30 PM Medical Record Number: [id number] Patient Account Number: [id number] Date of Birth/Sex: [birth year] (84 y.o. M) Treating RN: Huel Coventry Primary Care Provider: Guerry Bruin Other Clinician: Referring Provider: Guerry Bruin Treating Provider/Extender: Altamese [place] in Treatment: 12 History of Present Illness HPI Description: Pleasant 84 year old with history of chronic venous insufficiency, remote history of DVT, atrial fibrillation (on Coumadin). No history of diabetes. He says that he developed worsening left lower extremity swelling, blisters, and subsequent ulcerations in late October 2016. He was started on Augmentin for left lower extremity cellulitis, which has improved. Arterial ultrasound 07/27/2015 showed no significant peripheral arterial disease. Awaiting venous ultrasound. Performing dressing changes with silver alginate and using a Tubigrip for edema control. He denies any significant pain. No claudication or rest pain. Ambulating per his baseline with a walker. No fever or chills. Moderate drainage. 10/10/15; the patient arrives back today having not been seen in almost 2 months. He has no open wounds on his legs. He has a history of venous insufficiency, arterial insufficiency. He has chronic Coumadin use secondary to atrial fibrillation and a remote history of DVT. Fortunately as he has not been here in 2 months we were not able to order him stockings. He is not able to get on standard compression hose. Juxtalite stockings would cost him probably about $100 appear, the couple was not able to afford this. 11/11/17 READMISSION This is an 84 year old man who is not a diabetic. He does have a history of sick sinus syndrome atrial fibrillation and has a cardiac pacemaker and is on Coumadin.he also has a history of DVT. His wife  states that episodically over the last 2 years he has had wounds on his lower extremities but they have healed with home measures. Roughly 2-3 weeks ago the legs became a lot more swollen and he has had draining areas on the right anterior medial and lateral and the left medial leg. The patient's wife states that they were told to put support stockings on him but they are concerned because they worsen the blisters. this bit difficult to get the history but they think the edema has been increasing over the last several months. 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 He does not complain of chest pain orthopnea. He does not have a history of kidney issues that he is aware. Primary doctor at [facility]. He is not on diuretics. He was here 2 years ago. He was noted to have edema in his legs at that time. He had an ultrasound of his legs which I'll need to review. There was discussion about compression stockings but he is complaining clearly not using them. ABI in the right in this clinic was 1.37, 1.07 on the left 11/18/17; 84 year old frail man who has bilateral small lower extremity wounds in the setting of very significant multifactorial edema. He probably has chronic venous insufficiency with lymphedema but it is clearly fluid overloaded last week. He apparently has started on diuretics through his primary physician at [facility] but the patient's wife is not sure which one but states that he goes to the bathroom every 15 minutes. He is tolerating compression wraps. His wife stated that they charged him full price stating that he didn't have open wounds on his legs that would cover wraparound stockings. His edema is much  better controlled 11/25/17-he is here in follow-up evaluation for bilateral lower extremity ulcers. He is tolerating compression therapy. There was one episode since last appointment with copious amounts of drainage  from the right medial malleolus. We will add additional absorptive dressing, otherwise no change in treatment plan and he will follow-up next week 12/02/17; since I last saw this man is edema control is a lot better and I think this is a combination of diuretic therapy and are 3 layer compression. He has severe bilateral venous insufficiency with hemosiderin deposition and likely chronic stasis dermatitis/inflammation. In spite of the edema control he has several weeping areas on both legs including the left lateral leg, area on the right posterior leg. He also has an excoriated area just below his right tibial tuberosity. Fortuitously the patient has a follow-up with cardiology tomorrow 12/09/17; his edema control is excellent now on 40 mg of Lasix on his cardiologist last week and according to his wife was [name], [name] ([id number]) thoroughly evaluated including checking his pacemaker and no medication changes are made. He has good edema control in his lower legs. He has significant chronic venous insufficiency with hemosiderin physician as well as stasis dermatitis. major areas on the medial malleoli 12/16/17; he has excellent edema control however he is still having a lot of drainage and a lot of discomfort on the medial part of his right ankle/calf. The only other wound of any substances on the left medial malleolus. He is not been systemically unwell. 12/23/17; he has excellent edema control however he still has weeping edema fluid coming out of the medial part of the right ankle. The exact cause of this is unclear I gave him antibiotics last week however I don't really think that this is made a lot of difference. The other wound is on the left medial malleolus. This required debridement 12/30/17; he has good edema control bilaterally. Still having some weeping edema coming up the medial part of the right ankle. I suspect he has chronic venous inflammation/raises dermatitis. Some component  of lymphedema 01/06/18; patient has very good edema control bilaterally. We've been using 3 alert compression which he seems to tolerate. I suspect he has chronic venous inflammation stasis dermatitis and some component of lymphedema. He had 3 areas we are following one on the right medial ankle, one on the left medial malleolus and one on the left anterior leg. Using silver alginate 01/13/18; patient we changed to silver collagen last week. He is been using his own juxta light stockings. He comes in today his edema is well controlled.. Still some areas on the left anterior leg but everything on the right medial ankle looks closed. His wife is asking questions about his Lasix how long he needs to be on this etc. etc. doesn't look like he's had a lot of follow-up on his electrolytes and I've ordered a basic metabolic panel 01/20/18; patient has been using his own juxta light stockings. He has excellent edema control. There is no open area on the right leg. He has a small open area on the left medial malleolus which is all that is left of the extensive bilateral wounds he had. As mentioned the edema control is excellent. 02/03/18; the patient has been using his own drugs like stockings. He has excellent edema control. The right leg wound has maintain skin integrity. The area on the left medial malleolus is also closed. Electronic Signature(s) Signed: 02/04/2018 8:24:56 AM By: Baltazar Najjar MD Entered By: Baltazar Najjar on 02/03/2018  16:33:05 [name], [name] ([id number]) -------------------------------------------------------------------------------- Physical Exam Details Patient Name: [name], [name] Date of Service: 02/03/2018 3:30 PM Medical Record Number: [id number] Patient Account Number: [id number] Date of Birth/Sex: 09/16/1928 (84 y.o. M) Treating RN: Huel Coventry Primary Care Provider: Guerry Bruin Other Clinician: Referring Provider: Guerry Bruin Treating Provider/Extender:  Altamese [place] in Treatment: 12 Constitutional Sitting or standing Blood Pressure is within target range for patient.. Pulse regular and within target range for patient.Marland Kitchen Respirations regular, non-labored and within target range.. Temperature is normal and within the target range for the patient.Marland Kitchen appears in no distress. Notes exam; there is no open area on the right leg and there is no open area on the left medial malleolus this visit either. He has severe chronic venous insufficiency but his edema is much better controlled with diuretic and juxtalite stockings. Electronic Signature(s) Signed: 02/04/2018 8:24:56 AM By: Baltazar Najjar MD Entered By: Baltazar Najjar on 02/03/2018 16:34:11 [name] ([id number]) -------------------------------------------------------------------------------- Physician Orders Details Patient Name: [name] Date of Service: 02/03/2018 3:30 PM Medical Record Number: [id number] Patient Account Number: [id number] Date of Birth/Sex: 10-05-1928 (84 y.o. M) Treating RN: Huel Coventry Primary Care Provider: Guerry Bruin Other Clinician: Referring Provider: Guerry Bruin Treating Provider/Extender: Altamese [place] in Treatment: 12 Verbal / Phone Orders: No Diagnosis Coding Edema Control o Patient to wear own Velcro compression garment. - Wear stockings daily Discharge From [facility] Services Wound #5 Left,Medial Malleolus o Discharge from Wound Care Center Electronic Signature(s) Signed: 02/03/2018 5:21:30 PM By: Elliot Gurney, [name], RN, CWS, [name], [name] Signed: 02/04/2018 8:24:56 AM By: Baltazar Najjar MD Entered By: Elliot Gurney, [name], RN, CWS, Kim on 02/03/2018 16:00:04 [name] ([id number]) -------------------------------------------------------------------------------- Problem List Details Patient Name: [name], [name]. Date of Service: 02/03/2018 3:30 PM Medical Record Number: [id number] Patient Account Number:  [id number] Date of Birth/Sex: 08/12/1929 (84 y.o. M) Treating RN: Huel Coventry Primary Care Provider: Guerry Bruin Other Clinician: Referring Provider: Guerry Bruin Treating Provider/Extender: Altamese [place] in Treatment: 12 Active Problems ICD-10 Impacting Encounter Code Description Active Date Wound Healing Diagnosis L97.211 Non-pressure chronic ulcer of right calf limited to breakdown 11/11/2017 Yes of skin L97.221 Non-pressure chronic ulcer of left calf limited to breakdown of 11/11/2017 Yes skin I87.333 Chronic venous hypertension (idiopathic) with ulcer and 11/11/2017 Yes inflammation of bilateral lower extremity Inactive Problems Resolved Problems Electronic Signature(s) Signed: 02/04/2018 8:24:56 AM By: Baltazar Najjar MD Entered By: Baltazar Najjar on 02/03/2018 16:32:07 [name] ([id number]) -------------------------------------------------------------------------------- Progress Note Details Patient Name: Donald Musca C. Date of Service: 02/03/2018 3:30 PM Medical Record Number: [id number] Patient Account Number: [id number] Date of Birth/Sex: 14-Aug-1929 (84 y.o. M) Treating RN: Huel Coventry Primary Care Provider: Guerry Bruin Other Clinician: Referring Provider: Guerry Bruin Treating Provider/Extender: Altamese [place] in Treatment: 12 Subjective History of Present Illness (HPI) Pleasant 84 year old with history of chronic venous insufficiency, remote history of DVT, atrial fibrillation (on Coumadin). No history of diabetes. He says that he developed worsening left lower extremity swelling, blisters, and subsequent ulcerations in late October 2016. He was started on Augmentin for left lower extremity cellulitis, which has improved. Arterial ultrasound 07/27/2015 showed no significant peripheral arterial disease. Awaiting venous ultrasound. Performing dressing changes with silver alginate and using a Tubigrip for edema control. He  denies any significant pain. No claudication or rest pain. Ambulating per his baseline with a walker. No fever or chills. Moderate drainage. 10/10/15; the patient arrives back today having not been seen in almost 2 months.  He has no open wounds on his legs. He has a history of venous insufficiency, arterial insufficiency. He has chronic Coumadin use secondary to atrial fibrillation and a remote history of DVT. Fortunately as he has not been here in 2 months we were not able to order him stockings. He is not able to get on standard compression hose. Juxtalite stockings would cost him probably about $100 appear, the couple was not able to afford this. 11/11/17 READMISSION This is an 84 year old man who is not a diabetic. He does have a history of sick sinus syndrome atrial fibrillation and has a cardiac pacemaker and is on Coumadin.he also has a history of DVT. His wife states that episodically over the last 2 years he has had wounds on his lower extremities but they have healed with home measures. Roughly 2-3 weeks ago the legs became a lot more swollen and he has had draining areas on the right anterior medial and lateral and the left medial leg. The patient's wife states that they were told to put support stockings on him but they are concerned because they worsen the blisters. this bit difficult to get the history but they think the edema has been increasing over the last several months. 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 He does not complain of chest pain orthopnea. He does not have a history of kidney issues that he is aware. Primary doctor at [facility]. He is not on diuretics. He was here 2 years ago. He was noted to have edema in his legs at that time. He had an ultrasound of his legs which I'll need to review. There was discussion about compression stockings but he is complaining clearly not using them. ABI in the right in this clinic was 1.37,  1.07 on the left 11/18/17; 84 year old frail man who has bilateral small lower extremity wounds in the setting of very significant multifactorial edema. He probably has chronic venous insufficiency with lymphedema but it is clearly fluid overloaded last week. He apparently has started on diuretics through his primary physician at [facility] but the patient's wife is not sure which one but states that he goes to the bathroom every 15 minutes. He is tolerating compression wraps. His wife stated that they charged him full price stating that he didn't have open wounds on his legs that would cover wraparound stockings. His edema is much better controlled 11/25/17-he is here in follow-up evaluation for bilateral lower extremity ulcers. He is tolerating compression therapy. There was one episode since last appointment with copious amounts of drainage from the right medial malleolus. We will add additional absorptive dressing, otherwise no change in treatment plan and he will follow-up next week 12/02/17; since I last saw this man is edema control is a lot better and I think this is a combination of diuretic therapy and are 3 layer compression. He has severe bilateral venous insufficiency with hemosiderin deposition and likely chronic stasis dermatitis/inflammation. In spite of the edema control he has several weeping areas on both legs including the left lateral leg, area on the right posterior leg. He also has an excoriated area just below his right tibial tuberosity. Fortuitously the patient has a follow-up with cardiology tomorrow 12/09/17; his edema control is excellent now on 40 mg of Lasix on his cardiologist last week and according to his wife was thoroughly evaluated including checking his pacemaker and no medication changes are made. He has  good edema control in his lower legs. He has significant chronic venous insufficiency with hemosiderin physician as well as stasis  dermatitis. major [name], [name] ([id number]) areas on the medial malleoli 12/16/17; he has excellent edema control however he is still having a lot of drainage and a lot of discomfort on the medial part of his right ankle/calf. The only other wound of any substances on the left medial malleolus. He is not been systemically unwell. 12/23/17; he has excellent edema control however he still has weeping edema fluid coming out of the medial part of the right ankle. The exact cause of this is unclear I gave him antibiotics last week however I don't really think that this is made a lot of difference. The other wound is on the left medial malleolus. This required debridement 12/30/17; he has good edema control bilaterally. Still having some weeping edema coming up the medial part of the right ankle. I suspect he has chronic venous inflammation/raises dermatitis. Some component of lymphedema 01/06/18; patient has very good edema control bilaterally. We've been using 3 alert compression which he seems to tolerate. I suspect he has chronic venous inflammation stasis dermatitis and some component of lymphedema. He had 3 areas we are following one on the right medial ankle, one on the left medial malleolus and one on the left anterior leg. Using silver alginate 01/13/18; patient we changed to silver collagen last week. He is been using his own juxta light stockings. He comes in today his edema is well controlled.. Still some areas on the left anterior leg but everything on the right medial ankle looks closed. His wife is asking questions about his Lasix how long he needs to be on this etc. etc. doesn't look like he's had a lot of follow-up on his electrolytes and I've ordered a basic metabolic panel 01/20/18; patient has been using his own juxta light stockings. He has excellent edema control. There is no open area on the right leg. He has a small open area on the left medial malleolus which is all that is left  of the extensive bilateral wounds he had. As mentioned the edema control is excellent. 02/03/18; the patient has been using his own drugs like stockings. He has excellent edema control. The right leg wound has maintain skin integrity. The area on the left medial malleolus is also closed. Objective Constitutional Sitting or standing Blood Pressure is within target range for patient.. Pulse regular and within target range for patient.Marland Kitchen Respirations regular, non-labored and within target range.. Temperature is normal and within the target range for the patient.Marland Kitchen appears in no distress. Vitals Time Taken: 3:37 PM, Height: 68 in, Weight: 150 lbs, BMI: 22.8, Temperature: 97.5 F, Pulse: 99 bpm, Respiratory Rate: 16 breaths/min, Blood Pressure: 109/75 mmHg. General Notes: exam; there is no open area on the right leg and there is no open area on the left medial malleolus this visit either. He has severe chronic venous insufficiency but his edema is much better controlled with diuretic and juxtalite stockings. Integumentary (Hair, Skin) Wound #5 status is Healed - Epithelialized. Original cause of wound was Blister. The wound is located on the Left,Medial Malleolus. The wound measures 0cm length x 0cm width x 0cm depth; 0cm^2 area and 0cm^3 volume. There is no tunneling or undermining noted. There is a none present amount of drainage noted. The wound margin is flat and intact. There is no granulation within the wound bed. There is a large (67-100%) amount of necrotic tissue within  the wound bed including Eschar. Assessment [name], [name] ([id number]) Active Problems ICD-10 [phone] - Non-pressure chronic ulcer of right calf limited to breakdown of skin L97.221 - Non-pressure chronic ulcer of left calf limited to breakdown of skin I87.333 - Chronic venous hypertension (idiopathic) with ulcer and inflammation of bilateral lower extremity Plan Edema Control: Patient to wear own Velcro compression  garment. - Wear stockings daily Discharge From [facility] Services: Wound #5 Left,Medial Malleolus: Discharge from Wound Care Center #1 pitting the patient to be discharged from the wound care center #2 advised to lotion the legs every night #3 bilateral juxta light stockings. I think he is literally wearing them 24 /7 under his wife's direction Electronic Signature(s) Signed: 02/04/2018 8:24:56 AM By: Baltazar Najjar MD Entered By: Baltazar Najjar on 02/03/2018 16:35:05 [name] ([id number]) -------------------------------------------------------------------------------- SuperBill Details Patient Name: Donald Musca C. Date of Service: 02/03/2018 Medical Record Number: [id number] Patient Account Number: [id number] Date of Birth/Sex: Dec 11, 1928 (84 y.o. M) Treating RN: Huel Coventry Primary Care Provider: Guerry Bruin Other Clinician: Referring Provider: Guerry Bruin Treating Provider/Extender: Altamese [place] in Treatment: 12 Diagnosis Coding ICD-10 Codes Code Description [phone] Non-pressure chronic ulcer of right calf limited to breakdown of skin L97.221 Non-pressure chronic ulcer of left calf limited to breakdown of skin I87.333 Chronic venous hypertension (idiopathic) with ulcer and inflammation of bilateral lower extremity Facility Procedures CPT4 Code: [id number] Description: [phone] - WOUND CARE VISIT-LEV 2 EST PT Modifier: Quantity: 1 Physician Procedures CPT4 Code: [id number] Description: 32951 - WC PHYS LEVEL 2 - EST PT ICD-10 Diagnosis Description L97.211 Non-pressure chronic ulcer of right calf limited to breakdo L97.221 Non-pressure chronic ulcer of left calf limited to breakdow Modifier: wn of skin n of skin Quantity: 1 Electronic Signature(s) Signed: 02/04/2018 8:24:56 AM By: Baltazar Najjar MD Entered By: Baltazar Najjar on 02/03/2018 16:35:28

## 2018-02-07 NOTE — Progress Notes (Signed)
[name], [name] ([id number]) Visit Report for 02/03/2018 Arrival Information Details Patient Name: [name], [name] Date of Service: 02/03/2018 3:30 PM Medical Record Number: [id number] Patient Account Number: [id number] Date of Birth/Sex: 07/10/1929 (84 y.o. M) Treating RN: [name], Potts Primary Care Jerilyn Gillaspie: Donald BruinISOVEC, Potts Other Clinician: Referring Kilie Rund: Donald BruinISOVEC, Potts Treating [name]: [name] Weeks in Treatment: 12 Visit Information History Since Last Visit All ordered tests and consults were completed: No Patient Arrived: Wheel Chair Added or deleted any medications: No Arrival Time: 15:33 Any new allergies or adverse reactions: No Accompanied By: wife Had a fall or experienced change in No Transfer Assistance: EasyPivot Patient activities of daily living that may affect Lift risk of falls: Patient Identification Verified: Yes Signs or symptoms of abuse/neglect since last visito No Secondary Verification Process Yes Hospitalized since last visit: No Completed: Implantable device outside of the clinic excluding No Patient Requires Transmission-Based No cellular tissue based products placed in the center Precautions: since last visit: Patient Has Alerts: Yes Has Dressing in Place as Prescribed: Yes Patient Alerts: Patient on Blood Has Compression in Place as Prescribed: Yes Thinner Warfarin Pain Present Now: No Pacemaker Electronic Signature(s) Signed: 02/05/2018 5:38:20 PM By: [name] Entered By: [name] on 02/03/2018 15:37:14 [name], [name] ([id number]) -------------------------------------------------------------------------------- Clinic Level of Care Assessment Details Patient Name: [name], [name] Date of Service: 02/03/2018 3:30 PM Medical Record Number: [id number] Patient Account Number: [id number] Date of Birth/Sex: 04/07/1929 (84 y.o. M) Treating RN: [name] Primary Care Nickalous Stingley: Donald BruinISOVEC,  Potts Other Clinician: Referring Onesha Krebbs: Donald BruinISOVEC, Potts Treating Zeyna Mkrtchyan/Extender: [name] Weeks in Treatment: 12 Clinic Level of Care Assessment Items TOOL 4 Quantity Score []  - Use when only an EandM is performed on FOLLOW-UP visit 0 ASSESSMENTS - Nursing Assessment / Reassessment []  - Reassessment of Co-morbidities (includes updates in patient status) 0 X- 1 5 Reassessment of Adherence to Treatment Plan ASSESSMENTS - Wound and Skin Assessment / Reassessment X - Simple Wound Assessment / Reassessment - one wound 1 5 []  - 0 Complex Wound Assessment / Reassessment - multiple wounds []  - 0 Dermatologic / Skin Assessment (not related to wound area) ASSESSMENTS - Focused Assessment X - Circumferential Edema Measurements - multi extremities 1 5 []  - 0 Nutritional Assessment / Counseling / Intervention []  - 0 Lower Extremity Assessment (monofilament, tuning fork, pulses) []  - 0 Peripheral Arterial Disease Assessment (using hand held doppler) ASSESSMENTS - Ostomy and/or Continence Assessment and Care []  - Incontinence Assessment and Management 0 []  - 0 Ostomy Care Assessment and Management (repouching, etc.) PROCESS - Coordination of Care X - Simple Patient / Family Education for ongoing care 1 15 []  - 0 Complex (extensive) Patient / Family Education for ongoing care []  - 0 Staff obtains ChiropractorConsents, Records, Test Results / Process Orders []  - 0 Staff telephones HHA, Nursing Homes / Clarify orders / etc []  - 0 Routine Transfer to another Facility (non-emergent condition) []  - 0 Routine Hospital Admission (non-emergent condition) []  - 0 New Admissions / Manufacturing engineernsurance Authorizations / Ordering NPWT, Apligraf, etc. []  - 0 Emergency Hospital Admission (emergent condition) X- 1 10 Simple Discharge Coordination [name], [name] ([id number]) []  - 0 Complex (extensive) Discharge Coordination PROCESS - Special Needs []  - Pediatric / Minor Patient Management 0 []  -  0 Isolation Patient Management []  - 0 Hearing / Language / Visual special needs []  - 0 Assessment of Community assistance (transportation, D/C planning, etc.) []  - 0 Additional assistance / Altered mentation []  - 0 Support Surface(s)  Assessment (bed, cushion, seat, etc.) INTERVENTIONS - Wound Cleansing / Measurement []  - Simple Wound Cleansing - one wound 0 []  - 0 Complex Wound Cleansing - multiple wounds X- 1 5 Wound Imaging (photographs - any number of wounds) []  - 0 Wound Tracing (instead of photographs) []  - 0 Simple Wound Measurement - one wound []  - 0 Complex Wound Measurement - multiple wounds INTERVENTIONS - Wound Dressings []  - Small Wound Dressing one or multiple wounds 0 []  - 0 Medium Wound Dressing one or multiple wounds []  - 0 Large Wound Dressing one or multiple wounds []  - 0 Application of Medications - topical []  - 0 Application of Medications - injection INTERVENTIONS - Miscellaneous []  - External ear exam 0 []  - 0 Specimen Collection (cultures, biopsies, blood, body fluids, etc.) []  - 0 Specimen(s) / Culture(s) sent or taken to Lab for analysis []  - 0 Patient Transfer (multiple staff / Nurse, adult / Similar devices) []  - 0 Simple Staple / Suture removal (25 or less) []  - 0 Complex Staple / Suture removal (26 or more) []  - 0 Hypo / Hyperglycemic Management (close monitor of Blood Glucose) []  - 0 Ankle / Brachial Index (ABI) - do not check if billed separately X- 1 5 Vital Signs Samples, [name] ([id number]) Has the patient been seen at the hospital within the last three years: Yes Total Score: 50 Level Of Care: New/Established - Level 2 Electronic Signature(s) Signed: 02/03/2018 5:21:30 PM By: Elliot Gurney, [name], RN, CWS, [name], [name] Entered By: Elliot Gurney, [name], RN, CWS, Potts on 02/03/2018 16:00:47 [name] ([id number]) -------------------------------------------------------------------------------- Encounter Discharge Information  Details Patient Name: Donald Musca C. Date of Service: 02/03/2018 3:30 PM Medical Record Number: [id number] Patient Account Number: [id number] Date of Birth/Sex: 05/10/1929 (84 y.o. M) Treating RN: Donald Haggis Primary Care Lannette Avellino: Donald Bruin Other Clinician: Referring Sundra Haddix: Donald Bruin Treating Cristin Szatkowski/Extender: Donald [place]rel [place] in Treatment: 12 Encounter Discharge Information Items Discharge Condition: Stable Ambulatory Status: Wheelchair Discharge Destination: Home Transportation: Private Auto Accompanied By: wife Schedule Follow-up Appointment: No Clinical Summary of Care: Electronic Signature(s) Signed: 02/05/2018 5:38:20 PM By: Donald Mulling Entered By: Donald Mulling on 02/04/2018 10:53:33 [name] ([id number]) -------------------------------------------------------------------------------- Lower Extremity Assessment Details Patient Name: Donald Musca C. Date of Service: 02/03/2018 3:30 PM Medical Record Number: [id number] Patient Account Number: [id number] Date of Birth/Sex: 11-24-1928 (84 y.o. M) Treating RN: Donald Haggis Primary Care Kayliegh Boyers: Donald Bruin Other Clinician: Referring Irine Heminger: Donald Bruin Treating Loisann Roach/Extender: Donald [place] in Treatment: 12 Edema Assessment Assessed: [Left: No] [Right: No] [Left: Edema] [Right: :] Calf Left: Right: Point of Measurement: 33 cm From Medial Instep 30 cm 30.2 cm Ankle Left: Right: Point of Measurement: 12 cm From Medial Instep 21.1 cm 21.6 cm Vascular Assessment Pulses: Dorsalis Pedis Palpable: [Left:Yes] [Right:Yes] Posterior Tibial Extremity colors, hair growth, and conditions: Extremity Color: [Left:Hyperpigmented] [Right:Hyperpigmented] Capillary Refill: [Left:< 3 seconds] [Right:> 3 seconds] Toe Nail Assessment Left: Right: Thick: Yes Yes Discolored: Yes Yes Deformed: Yes Yes Improper Length and Hygiene: Yes Yes Electronic  Signature(s) Signed: 02/05/2018 5:38:20 PM By: Donald Mulling Entered By: Donald Mulling on 02/03/2018 15:45:39 Hochstatter, [name] ([id number]) -------------------------------------------------------------------------------- Multi Wound Chart Details Patient Name: Donald Musca C. Date of Service: 02/03/2018 3:30 PM Medical Record Number: [id number] Patient Account Number: [id number] Date of Birth/Sex: 02/11/1929 (84 y.o. M) Treating RN: Donald Coventry Primary Care Papa [place]: Donald Bruin Other Clinician: Referring Kalik Hoare: Donald Bruin Treating Lakeyta Vandenheuvel/Extender: Donald [place] in Treatment: 12 Vital Signs Height(in): 68 Pulse(bpm):  99 Weight(lbs): 150 Blood Pressure(mmHg): 109/75 Body Mass Index(BMI): 23 Temperature(F): 97.5 Respiratory Rate 16 (breaths/min): Photos: [5:No Photos] [N/A:N/A] Wound Location: [5:Left, Medial Malleolus] [N/A:N/A] Wounding Event: [5:Blister] [N/A:N/A] Primary Etiology: [5:Venous Leg Ulcer] [N/A:N/A] Comorbid History: [5:Cataracts, Arrhythmia, Deep Vein Thrombosis, Osteoarthritis] [N/A:N/A] Date Acquired: [5:10/28/2017] [N/A:N/A] Weeks of Treatment: [5:12] [N/A:N/A] Wound Status: [5:Healed - Epithelialized] [N/A:N/A] Measurements L x W x D [5:0x0x0] [N/A:N/A] (cm) Area (cm) : [5:0] [N/A:N/A] Volume (cm) : [5:0] [N/A:N/A] % Reduction in Area: [5:100.00%] [N/A:N/A] % Reduction in Volume: [5:100.00%] [N/A:N/A] Classification: [5:Full Thickness Without Exposed Support Structures] [N/A:N/A] Exudate Amount: [5:None Present] [N/A:N/A] Wound Margin: [5:Flat and Intact] [N/A:N/A] Granulation Amount: [5:None Present (0%)] [N/A:N/A] Necrotic Amount: [5:Large (67-100%)] [N/A:N/A] Necrotic Tissue: [5:Eschar] [N/A:N/A] Epithelialization: [5:Large (67-100%)] [N/A:N/A] Periwound Skin Texture: [5:No Abnormalities Noted] [N/A:N/A] Periwound Skin Moisture: [5:No Abnormalities Noted] [N/A:N/A] Periwound Skin Color: [5:No Abnormalities  Noted] [N/A:N/A] Tenderness on Palpation: [5:No] [N/A:N/A] Wound Preparation: [5:Ulcer Cleansing: Rinsed/Irrigated with Saline] [N/A:N/A] Topical Anesthetic Applied: Other: lidocaine 4% Treatment Notes SYLVAN, SOOKDEO ([id number]) Electronic Signature(s) Signed: 02/04/2018 8:24:56 AM By: Baltazar Najjar MD Entered By: Baltazar Najjar on 02/03/2018 16:32:18 [name] ([id number]) -------------------------------------------------------------------------------- Multi-Disciplinary Care Plan Details Patient Name: [name] Date of Service: 02/03/2018 3:30 PM Medical Record Number: [id number] Patient Account Number: [id number] Date of Birth/Sex: June 02, 1929 (84 y.o. M) Treating RN: Donald Coventry Primary Care Cutberto Winfree: Donald Bruin Other Clinician: Referring Leonidas Boateng: Donald Bruin Treating Latina Frank/Extender: Donald [place] in Treatment: 12 Active Inactive Electronic Signature(s) Signed: 02/03/2018 5:21:30 PM By: Elliot Gurney, [name], RN, CWS, [name], [name] Entered By: Elliot Gurney, [name], RN, CWS, Potts on 02/03/2018 15:58:54 [name] ([id number]) -------------------------------------------------------------------------------- Pain Assessment Details Patient Name: Donald Musca C. Date of Service: 02/03/2018 3:30 PM Medical Record Number: [id number] Patient Account Number: [id number] Date of Birth/Sex: [birth year] (84 y.o. M) Treating RN: Donald Haggis Primary Care Carnelius Hammitt: Donald Bruin Other Clinician: Referring Bayden Gil: Donald Bruin Treating Kaylani Fromme/Extender: Donald [place] in Treatment: 12 Active Problems Location of Pain Severity and Description of Pain Patient Has Paino No Site Locations Pain Management and Medication Current Pain Management: Electronic Signature(s) Signed: 02/05/2018 5:38:20 PM By: Donald Mulling Entered By: Donald Mulling on 02/03/2018 15:37:34 [name]  ([id number]) -------------------------------------------------------------------------------- Patient/Caregiver Education Details Patient Name: [name]. Date of Service: 02/03/2018 3:30 PM Medical Record Number: [id number] Patient Account Number: [id number] Date of Birth/Gender: Jun 04, 1929 (84 y.o. M) Treating RN: Donald Haggis Primary Care Physician: Donald Bruin Other Clinician: Referring Physician: Guerry Bruin Treating Physician/Extender: Donald [place] in Treatment: 12 Education Assessment Education Provided To: Patient and Caregiver wife Education Topics Provided Wound/Skin Impairment: Handouts: Other: please call our office if you have any questions or concerns. Methods: Explain/Verbal Responses: State content correctly Electronic Signature(s) Signed: 02/05/2018 5:38:20 PM By: Donald Mulling Entered By: Donald Mulling on 02/04/2018 10:54:05 [name] ([id number]) -------------------------------------------------------------------------------- Wound Assessment Details Patient Name: Donald Musca C. Date of Service: 02/03/2018 3:30 PM Medical Record Number: [id number] Patient Account Number: [id number] Date of Birth/Sex: 12/07/1928 (84 y.o. M) Treating RN: Donald Coventry Primary Care Yanelle Sousa: Donald Bruin Other Clinician: Referring Zalma Channing: Donald Bruin Treating Mylissa Lambe/Extender: Donald [place] in Treatment: 12 Wound Status Wound Number: 5 Primary Venous Leg Ulcer Etiology: Wound Location: Left, Medial Malleolus Wound Status: Healed - Epithelialized Wounding Event: Blister Comorbid Cataracts, Arrhythmia, Deep Vein Date Acquired: 10/28/2017 History: Thrombosis, Osteoarthritis Weeks Of Treatment: 12 Clustered Wound: No Photos Photo Uploaded By: Donald Mulling on 02/04/2018 08:24:18 Wound Measurements Length: (cm) 0 Width: (cm) 0 Depth: (  cm) 0 Area: (cm) 0 Volume: (cm) 0 % Reduction in Area: 100% %  Reduction in Volume: 100% Epithelialization: Large (67-100%) Tunneling: No Undermining: No Wound Description Full Thickness Without Exposed Support Classification: Structures Wound Margin: Flat and Intact Exudate None Present Amount: Foul Odor After Cleansing: No Slough/Fibrino Yes Wound Bed Granulation Amount: None Present (0%) Necrotic Amount: Large (67-100%) Necrotic Quality: Eschar Periwound Skin Texture Texture Color No Abnormalities Noted: No No Abnormalities Noted: No Moisture No Abnormalities Noted: No Wound Preparation Ulcer Cleansing: Rinsed/Irrigated with Saline Schellinger, [name] ([id number]) Topical Anesthetic Applied: Other: lidocaine 4%, Electronic Signature(s) Signed: 02/03/2018 5:21:30 PM By: Elliot Gurney, [name], RN, CWS, [name], [name] Entered By: Elliot Gurney, [name], RN, CWS, Potts on 02/03/2018 16:20:22 [name] ([id number]) -------------------------------------------------------------------------------- Vitals Details Patient Name: Donald Musca C. Date of Service: 02/03/2018 3:30 PM Medical Record Number: [id number] Patient Account Number: [id number] Date of Birth/Sex: [birth year] (84 y.o. M) Treating RN: Donald Haggis Primary Care Mikena Masoner: Donald Bruin Other Clinician: Referring Burgess Sheriff: Donald Bruin Treating Kataleia Quaranta/Extender: Donald [place] in Treatment: 12 Vital Signs Time Taken: 15:37 Temperature (F): 97.5 Height (in): 68 Pulse (bpm): 99 Weight (lbs): 150 Respiratory Rate (breaths/min): 16 Body Mass Index (BMI): 22.8 Blood Pressure (mmHg): 109/75 Reference Range: 80 - 120 mg / dl Electronic Signature(s) Signed: 02/05/2018 5:38:20 PM By: Donald Mulling Entered By: Donald Mulling on 02/03/2018 15:38:04

## 2018-02-08 DIAGNOSIS — L97211 Non-pressure chronic ulcer of right calf limited to breakdown of skin: Secondary | ICD-10-CM | POA: Diagnosis not present

## 2018-02-08 DIAGNOSIS — M1990 Unspecified osteoarthritis, unspecified site: Secondary | ICD-10-CM | POA: Diagnosis not present

## 2018-02-08 DIAGNOSIS — I10 Essential (primary) hypertension: Secondary | ICD-10-CM | POA: Diagnosis not present

## 2018-02-08 DIAGNOSIS — I87333 Chronic venous hypertension (idiopathic) with ulcer and inflammation of bilateral lower extremity: Secondary | ICD-10-CM | POA: Diagnosis not present

## 2018-02-08 DIAGNOSIS — L97321 Non-pressure chronic ulcer of left ankle limited to breakdown of skin: Secondary | ICD-10-CM | POA: Diagnosis not present

## 2018-02-08 DIAGNOSIS — I4891 Unspecified atrial fibrillation: Secondary | ICD-10-CM | POA: Diagnosis not present

## 2018-02-23 DIAGNOSIS — Z7901 Long term (current) use of anticoagulants: Secondary | ICD-10-CM | POA: Diagnosis not present

## 2018-02-23 DIAGNOSIS — I8290 Acute embolism and thrombosis of unspecified vein: Secondary | ICD-10-CM | POA: Diagnosis not present

## 2018-02-23 DIAGNOSIS — I482 Chronic atrial fibrillation: Secondary | ICD-10-CM | POA: Diagnosis not present

## 2018-03-04 ENCOUNTER — Ambulatory Visit (INDEPENDENT_AMBULATORY_CARE_PROVIDER_SITE_OTHER): Payer: Medicare Other | Admitting: *Deleted

## 2018-03-04 ENCOUNTER — Telehealth: Payer: Self-pay

## 2018-03-04 DIAGNOSIS — I495 Sick sinus syndrome: Secondary | ICD-10-CM | POA: Diagnosis not present

## 2018-03-04 NOTE — Telephone Encounter (Signed)
LMOVM reminding pt to send remote transmission.   

## 2018-03-05 LAB — CUP PACEART REMOTE DEVICE CHECK
Implantable Lead Implant Date: 20110802
Implantable Pulse Generator Implant Date: 20110802
MDC IDC LEAD IMPLANT DT: 20110802
MDC IDC LEAD LOCATION: 753859
MDC IDC LEAD LOCATION: 753860
MDC IDC SESS DTM: 20190628114703

## 2018-03-05 NOTE — Progress Notes (Signed)
Remote pacemaker transmission.   

## 2018-03-25 DIAGNOSIS — Z7901 Long term (current) use of anticoagulants: Secondary | ICD-10-CM | POA: Diagnosis not present

## 2018-03-25 DIAGNOSIS — I482 Chronic atrial fibrillation: Secondary | ICD-10-CM | POA: Diagnosis not present

## 2018-03-25 DIAGNOSIS — I8290 Acute embolism and thrombosis of unspecified vein: Secondary | ICD-10-CM | POA: Diagnosis not present

## 2018-05-18 DIAGNOSIS — I8290 Acute embolism and thrombosis of unspecified vein: Secondary | ICD-10-CM | POA: Diagnosis not present

## 2018-05-18 DIAGNOSIS — Z7901 Long term (current) use of anticoagulants: Secondary | ICD-10-CM | POA: Diagnosis not present

## 2018-05-18 DIAGNOSIS — K219 Gastro-esophageal reflux disease without esophagitis: Secondary | ICD-10-CM | POA: Diagnosis not present

## 2018-05-18 DIAGNOSIS — M47896 Other spondylosis, lumbar region: Secondary | ICD-10-CM | POA: Diagnosis not present

## 2018-05-18 DIAGNOSIS — Z79899 Other long term (current) drug therapy: Secondary | ICD-10-CM | POA: Diagnosis not present

## 2018-05-18 DIAGNOSIS — I482 Chronic atrial fibrillation: Secondary | ICD-10-CM | POA: Diagnosis not present

## 2018-05-18 DIAGNOSIS — R972 Elevated prostate specific antigen [PSA]: Secondary | ICD-10-CM | POA: Diagnosis not present

## 2018-05-18 DIAGNOSIS — K589 Irritable bowel syndrome without diarrhea: Secondary | ICD-10-CM | POA: Diagnosis not present

## 2018-06-03 ENCOUNTER — Telehealth: Payer: Self-pay

## 2018-06-03 ENCOUNTER — Ambulatory Visit (INDEPENDENT_AMBULATORY_CARE_PROVIDER_SITE_OTHER): Payer: Medicare Other | Admitting: *Deleted

## 2018-06-03 DIAGNOSIS — I495 Sick sinus syndrome: Secondary | ICD-10-CM

## 2018-06-03 DIAGNOSIS — I480 Paroxysmal atrial fibrillation: Secondary | ICD-10-CM

## 2018-06-03 NOTE — Telephone Encounter (Signed)
LMOVM reminding pt to send remote transmission.   

## 2018-06-04 ENCOUNTER — Encounter: Payer: Self-pay | Admitting: Cardiology

## 2018-06-04 NOTE — Progress Notes (Signed)
Remote pacemaker transmission.   

## 2018-06-17 DIAGNOSIS — I482 Chronic atrial fibrillation, unspecified: Secondary | ICD-10-CM | POA: Diagnosis not present

## 2018-06-17 DIAGNOSIS — Z7901 Long term (current) use of anticoagulants: Secondary | ICD-10-CM | POA: Diagnosis not present

## 2018-06-22 LAB — CUP PACEART REMOTE DEVICE CHECK
Implantable Lead Implant Date: 20110802
MDC IDC LEAD IMPLANT DT: 20110802
MDC IDC LEAD LOCATION: 753859
MDC IDC LEAD LOCATION: 753860
MDC IDC PG IMPLANT DT: 20110802
MDC IDC SESS DTM: 20191015143910

## 2018-07-14 DIAGNOSIS — I482 Chronic atrial fibrillation, unspecified: Secondary | ICD-10-CM | POA: Diagnosis not present

## 2018-07-14 DIAGNOSIS — Z7901 Long term (current) use of anticoagulants: Secondary | ICD-10-CM | POA: Diagnosis not present

## 2018-07-14 DIAGNOSIS — I8290 Acute embolism and thrombosis of unspecified vein: Secondary | ICD-10-CM | POA: Diagnosis not present

## 2018-07-14 DIAGNOSIS — M1990 Unspecified osteoarthritis, unspecified site: Secondary | ICD-10-CM | POA: Diagnosis not present

## 2018-08-12 DIAGNOSIS — Z7901 Long term (current) use of anticoagulants: Secondary | ICD-10-CM | POA: Diagnosis not present

## 2018-08-12 DIAGNOSIS — I482 Chronic atrial fibrillation, unspecified: Secondary | ICD-10-CM | POA: Diagnosis not present

## 2018-08-12 DIAGNOSIS — I8290 Acute embolism and thrombosis of unspecified vein: Secondary | ICD-10-CM | POA: Diagnosis not present

## 2018-08-26 DIAGNOSIS — I8290 Acute embolism and thrombosis of unspecified vein: Secondary | ICD-10-CM | POA: Diagnosis not present

## 2018-08-26 DIAGNOSIS — Z7901 Long term (current) use of anticoagulants: Secondary | ICD-10-CM | POA: Diagnosis not present

## 2018-08-26 DIAGNOSIS — I4821 Permanent atrial fibrillation: Secondary | ICD-10-CM | POA: Diagnosis not present

## 2018-09-03 ENCOUNTER — Encounter: Payer: Self-pay | Admitting: Cardiology

## 2018-09-03 ENCOUNTER — Telehealth: Payer: Self-pay

## 2018-09-03 NOTE — Telephone Encounter (Signed)
spk w/ sps regarding pt missed remote transmission 

## 2018-09-07 ENCOUNTER — Ambulatory Visit (INDEPENDENT_AMBULATORY_CARE_PROVIDER_SITE_OTHER): Payer: Medicare Other

## 2018-09-07 DIAGNOSIS — I495 Sick sinus syndrome: Secondary | ICD-10-CM | POA: Diagnosis not present

## 2018-09-07 LAB — CUP PACEART REMOTE DEVICE CHECK
Date Time Interrogation Session: 20191231134748
Implantable Lead Implant Date: 20110802
Implantable Lead Implant Date: 20110802
Implantable Lead Location: 753859
Implantable Pulse Generator Implant Date: 20110802
MDC IDC LEAD LOCATION: 753860

## 2018-09-07 NOTE — Progress Notes (Signed)
Remote pacemaker transmission.   

## 2018-10-05 DIAGNOSIS — I8290 Acute embolism and thrombosis of unspecified vein: Secondary | ICD-10-CM | POA: Diagnosis not present

## 2018-10-05 DIAGNOSIS — I4821 Permanent atrial fibrillation: Secondary | ICD-10-CM | POA: Diagnosis not present

## 2018-10-05 DIAGNOSIS — Z7901 Long term (current) use of anticoagulants: Secondary | ICD-10-CM | POA: Diagnosis not present

## 2018-10-20 ENCOUNTER — Encounter (HOSPITAL_COMMUNITY): Payer: Self-pay | Admitting: Physician Assistant

## 2018-10-20 ENCOUNTER — Other Ambulatory Visit: Payer: Self-pay

## 2018-10-20 ENCOUNTER — Emergency Department (HOSPITAL_COMMUNITY): Payer: Medicare Other

## 2018-10-20 ENCOUNTER — Inpatient Hospital Stay (HOSPITAL_COMMUNITY)
Admission: EM | Admit: 2018-10-20 | Discharge: 2018-10-30 | DRG: 469 | Disposition: A | Discharge status: Skilled Nursing Facility | Payer: Medicare Other | Attending: Internal Medicine | Admitting: Internal Medicine

## 2018-10-20 DIAGNOSIS — G894 Chronic pain syndrome: Secondary | ICD-10-CM | POA: Diagnosis present

## 2018-10-20 DIAGNOSIS — D696 Thrombocytopenia, unspecified: Secondary | ICD-10-CM | POA: Diagnosis present

## 2018-10-20 DIAGNOSIS — D72829 Elevated white blood cell count, unspecified: Secondary | ICD-10-CM | POA: Diagnosis not present

## 2018-10-20 DIAGNOSIS — Z471 Aftercare following joint replacement surgery: Secondary | ICD-10-CM | POA: Diagnosis not present

## 2018-10-20 DIAGNOSIS — S72032A Displaced midcervical fracture of left femur, initial encounter for closed fracture: Secondary | ICD-10-CM | POA: Diagnosis not present

## 2018-10-20 DIAGNOSIS — M542 Cervicalgia: Secondary | ICD-10-CM | POA: Diagnosis not present

## 2018-10-20 DIAGNOSIS — Z96642 Presence of left artificial hip joint: Secondary | ICD-10-CM | POA: Diagnosis not present

## 2018-10-20 DIAGNOSIS — Z01810 Encounter for preprocedural cardiovascular examination: Secondary | ICD-10-CM | POA: Diagnosis not present

## 2018-10-20 DIAGNOSIS — W050XXA Fall from non-moving wheelchair, initial encounter: Secondary | ICD-10-CM | POA: Diagnosis present

## 2018-10-20 DIAGNOSIS — S59901A Unspecified injury of right elbow, initial encounter: Secondary | ICD-10-CM | POA: Diagnosis not present

## 2018-10-20 DIAGNOSIS — I471 Supraventricular tachycardia: Secondary | ICD-10-CM | POA: Diagnosis present

## 2018-10-20 DIAGNOSIS — R0602 Shortness of breath: Secondary | ICD-10-CM | POA: Diagnosis not present

## 2018-10-20 DIAGNOSIS — S72009A Fracture of unspecified part of neck of unspecified femur, initial encounter for closed fracture: Secondary | ICD-10-CM | POA: Diagnosis present

## 2018-10-20 DIAGNOSIS — M1120 Other chondrocalcinosis, unspecified site: Secondary | ICD-10-CM | POA: Diagnosis present

## 2018-10-20 DIAGNOSIS — M064 Inflammatory polyarthropathy: Secondary | ICD-10-CM | POA: Diagnosis not present

## 2018-10-20 DIAGNOSIS — D539 Nutritional anemia, unspecified: Secondary | ICD-10-CM | POA: Diagnosis present

## 2018-10-20 DIAGNOSIS — I4891 Unspecified atrial fibrillation: Secondary | ICD-10-CM | POA: Diagnosis not present

## 2018-10-20 DIAGNOSIS — R2689 Other abnormalities of gait and mobility: Secondary | ICD-10-CM | POA: Diagnosis not present

## 2018-10-20 DIAGNOSIS — Z79891 Long term (current) use of opiate analgesic: Secondary | ICD-10-CM

## 2018-10-20 DIAGNOSIS — Z79899 Other long term (current) drug therapy: Secondary | ICD-10-CM

## 2018-10-20 DIAGNOSIS — M545 Low back pain: Secondary | ICD-10-CM | POA: Diagnosis not present

## 2018-10-20 DIAGNOSIS — I340 Nonrheumatic mitral (valve) insufficiency: Secondary | ICD-10-CM | POA: Diagnosis not present

## 2018-10-20 DIAGNOSIS — M5489 Other dorsalgia: Secondary | ICD-10-CM | POA: Diagnosis not present

## 2018-10-20 DIAGNOSIS — M419 Scoliosis, unspecified: Secondary | ICD-10-CM | POA: Diagnosis present

## 2018-10-20 DIAGNOSIS — E876 Hypokalemia: Secondary | ICD-10-CM | POA: Diagnosis present

## 2018-10-20 DIAGNOSIS — D509 Iron deficiency anemia, unspecified: Secondary | ICD-10-CM | POA: Diagnosis present

## 2018-10-20 DIAGNOSIS — R413 Other amnesia: Secondary | ICD-10-CM | POA: Diagnosis present

## 2018-10-20 DIAGNOSIS — I480 Paroxysmal atrial fibrillation: Secondary | ICD-10-CM | POA: Diagnosis present

## 2018-10-20 DIAGNOSIS — I2510 Atherosclerotic heart disease of native coronary artery without angina pectoris: Secondary | ICD-10-CM | POA: Diagnosis present

## 2018-10-20 DIAGNOSIS — R296 Repeated falls: Secondary | ICD-10-CM | POA: Diagnosis present

## 2018-10-20 DIAGNOSIS — I119 Hypertensive heart disease without heart failure: Secondary | ICD-10-CM | POA: Diagnosis present

## 2018-10-20 DIAGNOSIS — I959 Hypotension, unspecified: Secondary | ICD-10-CM | POA: Diagnosis present

## 2018-10-20 DIAGNOSIS — Z6823 Body mass index (BMI) 23.0-23.9, adult: Secondary | ICD-10-CM

## 2018-10-20 DIAGNOSIS — M4800 Spinal stenosis, site unspecified: Secondary | ICD-10-CM | POA: Diagnosis present

## 2018-10-20 DIAGNOSIS — M1280 Other specific arthropathies, not elsewhere classified, unspecified site: Secondary | ICD-10-CM | POA: Diagnosis present

## 2018-10-20 DIAGNOSIS — S72002S Fracture of unspecified part of neck of left femur, sequela: Secondary | ICD-10-CM | POA: Diagnosis not present

## 2018-10-20 DIAGNOSIS — F0390 Unspecified dementia without behavioral disturbance: Secondary | ICD-10-CM | POA: Diagnosis present

## 2018-10-20 DIAGNOSIS — Z741 Need for assistance with personal care: Secondary | ICD-10-CM | POA: Diagnosis not present

## 2018-10-20 DIAGNOSIS — I361 Nonrheumatic tricuspid (valve) insufficiency: Secondary | ICD-10-CM | POA: Diagnosis not present

## 2018-10-20 DIAGNOSIS — S299XXA Unspecified injury of thorax, initial encounter: Secondary | ICD-10-CM | POA: Diagnosis not present

## 2018-10-20 DIAGNOSIS — L89159 Pressure ulcer of sacral region, unspecified stage: Secondary | ICD-10-CM | POA: Diagnosis present

## 2018-10-20 DIAGNOSIS — K219 Gastro-esophageal reflux disease without esophagitis: Secondary | ICD-10-CM | POA: Diagnosis present

## 2018-10-20 DIAGNOSIS — K5900 Constipation, unspecified: Secondary | ICD-10-CM | POA: Diagnosis present

## 2018-10-20 DIAGNOSIS — Z9189 Other specified personal risk factors, not elsewhere classified: Secondary | ICD-10-CM | POA: Diagnosis not present

## 2018-10-20 DIAGNOSIS — M1189 Other specified crystal arthropathies, multiple sites: Secondary | ICD-10-CM | POA: Diagnosis not present

## 2018-10-20 DIAGNOSIS — R1312 Dysphagia, oropharyngeal phase: Secondary | ICD-10-CM | POA: Diagnosis not present

## 2018-10-20 DIAGNOSIS — M1990 Unspecified osteoarthritis, unspecified site: Secondary | ICD-10-CM | POA: Diagnosis present

## 2018-10-20 DIAGNOSIS — R52 Pain, unspecified: Secondary | ICD-10-CM

## 2018-10-20 DIAGNOSIS — S3992XA Unspecified injury of lower back, initial encounter: Secondary | ICD-10-CM | POA: Diagnosis not present

## 2018-10-20 DIAGNOSIS — M2550 Pain in unspecified joint: Secondary | ICD-10-CM | POA: Diagnosis not present

## 2018-10-20 DIAGNOSIS — S72042D Displaced fracture of base of neck of left femur, subsequent encounter for closed fracture with routine healing: Secondary | ICD-10-CM | POA: Diagnosis not present

## 2018-10-20 DIAGNOSIS — S72002A Fracture of unspecified part of neck of left femur, initial encounter for closed fracture: Principal | ICD-10-CM | POA: Diagnosis present

## 2018-10-20 DIAGNOSIS — M25521 Pain in right elbow: Secondary | ICD-10-CM | POA: Diagnosis present

## 2018-10-20 DIAGNOSIS — Z4789 Encounter for other orthopedic aftercare: Secondary | ICD-10-CM | POA: Diagnosis not present

## 2018-10-20 DIAGNOSIS — M6281 Muscle weakness (generalized): Secondary | ICD-10-CM | POA: Diagnosis not present

## 2018-10-20 DIAGNOSIS — R0902 Hypoxemia: Secondary | ICD-10-CM | POA: Diagnosis not present

## 2018-10-20 DIAGNOSIS — Z950 Presence of cardiac pacemaker: Secondary | ICD-10-CM

## 2018-10-20 DIAGNOSIS — M7989 Other specified soft tissue disorders: Secondary | ICD-10-CM | POA: Diagnosis not present

## 2018-10-20 DIAGNOSIS — D689 Coagulation defect, unspecified: Secondary | ICD-10-CM | POA: Diagnosis present

## 2018-10-20 DIAGNOSIS — I495 Sick sinus syndrome: Secondary | ICD-10-CM | POA: Diagnosis present

## 2018-10-20 DIAGNOSIS — Z809 Family history of malignant neoplasm, unspecified: Secondary | ICD-10-CM

## 2018-10-20 DIAGNOSIS — E871 Hypo-osmolality and hyponatremia: Secondary | ICD-10-CM | POA: Diagnosis present

## 2018-10-20 DIAGNOSIS — W19XXXA Unspecified fall, initial encounter: Secondary | ICD-10-CM

## 2018-10-20 DIAGNOSIS — I10 Essential (primary) hypertension: Secondary | ICD-10-CM | POA: Diagnosis present

## 2018-10-20 DIAGNOSIS — M25531 Pain in right wrist: Secondary | ICD-10-CM | POA: Diagnosis present

## 2018-10-20 DIAGNOSIS — L8990 Pressure ulcer of unspecified site, unspecified stage: Secondary | ICD-10-CM

## 2018-10-20 DIAGNOSIS — E43 Unspecified severe protein-calorie malnutrition: Secondary | ICD-10-CM

## 2018-10-20 DIAGNOSIS — D62 Acute posthemorrhagic anemia: Secondary | ICD-10-CM | POA: Diagnosis not present

## 2018-10-20 DIAGNOSIS — M25561 Pain in right knee: Secondary | ICD-10-CM | POA: Diagnosis present

## 2018-10-20 DIAGNOSIS — E8889 Other specified metabolic disorders: Secondary | ICD-10-CM | POA: Diagnosis present

## 2018-10-20 DIAGNOSIS — R41841 Cognitive communication deficit: Secondary | ICD-10-CM | POA: Diagnosis not present

## 2018-10-20 DIAGNOSIS — Z7401 Bed confinement status: Secondary | ICD-10-CM | POA: Diagnosis not present

## 2018-10-20 DIAGNOSIS — M25421 Effusion, right elbow: Secondary | ICD-10-CM | POA: Diagnosis present

## 2018-10-20 DIAGNOSIS — Z7901 Long term (current) use of anticoagulants: Secondary | ICD-10-CM

## 2018-10-20 DIAGNOSIS — I4819 Other persistent atrial fibrillation: Secondary | ICD-10-CM | POA: Diagnosis not present

## 2018-10-20 DIAGNOSIS — K449 Diaphragmatic hernia without obstruction or gangrene: Secondary | ICD-10-CM | POA: Diagnosis present

## 2018-10-20 LAB — CBC WITH DIFFERENTIAL/PLATELET
Abs Immature Granulocytes: 0.04 10*3/uL (ref 0.00–0.07)
Basophils Absolute: 0 10*3/uL (ref 0.0–0.1)
Basophils Relative: 0 %
Eosinophils Absolute: 0 10*3/uL (ref 0.0–0.5)
Eosinophils Relative: 0 %
HCT: 42.3 % (ref 39.0–52.0)
Hemoglobin: 13.4 g/dL (ref 13.0–17.0)
Immature Granulocytes: 0 %
Lymphocytes Relative: 9 %
Lymphs Abs: 1 10*3/uL (ref 0.7–4.0)
MCH: 32.3 pg (ref 26.0–34.0)
MCHC: 31.7 g/dL (ref 30.0–36.0)
MCV: 101.9 fL — ABNORMAL HIGH (ref 80.0–100.0)
Monocytes Absolute: 1 10*3/uL (ref 0.1–1.0)
Monocytes Relative: 9 %
Neutro Abs: 9.3 10*3/uL — ABNORMAL HIGH (ref 1.7–7.7)
Neutrophils Relative %: 82 %
Platelets: 179 10*3/uL (ref 150–400)
RBC: 4.15 MIL/uL — ABNORMAL LOW (ref 4.22–5.81)
RDW: 14.1 % (ref 11.5–15.5)
WBC: 11.4 10*3/uL — ABNORMAL HIGH (ref 4.0–10.5)
nRBC: 0 % (ref 0.0–0.2)

## 2018-10-20 LAB — BASIC METABOLIC PANEL
Anion gap: 11 (ref 5–15)
BUN: 16 mg/dL (ref 8–23)
CO2: 29 mmol/L (ref 22–32)
Calcium: 9.1 mg/dL (ref 8.9–10.3)
Chloride: 96 mmol/L — ABNORMAL LOW (ref 98–111)
Creatinine, Ser: 0.72 mg/dL (ref 0.61–1.24)
GFR calc Af Amer: >60 mL/min (ref >60)
GFR calc non Af Amer: >60 mL/min (ref >60)
Glucose, Bld: 105 mg/dL — ABNORMAL HIGH (ref 70–99)
Potassium: 4 mmol/L (ref 3.5–5.1)
Sodium: 136 mmol/L (ref 135–145)

## 2018-10-20 LAB — MRSA PCR SCREENING: MRSA by PCR: POSITIVE — AB

## 2018-10-20 LAB — PROTIME-INR
INR: 2.21
Prothrombin Time: 24.2 seconds — ABNORMAL HIGH (ref 11.4–15.2)

## 2018-10-20 MED ORDER — MORPHINE SULFATE (PF) 2 MG/ML IV SOLN
0.5000 mg | INTRAVENOUS | Status: DC | PRN
  Administered 2018-10-20: 0.5 mg via INTRAVENOUS
  Filled 2018-10-20 (×2): qty 1

## 2018-10-20 MED ORDER — ACETAMINOPHEN 325 MG PO TABS
650.0000 mg | ORAL_TABLET | Freq: Once | ORAL | Status: AC
  Administered 2018-10-20: 650 mg via ORAL
  Filled 2018-10-20: qty 2

## 2018-10-20 MED ORDER — HYDROCODONE-ACETAMINOPHEN 10-325 MG PO TABS
1.0000 | ORAL_TABLET | Freq: Four times a day (QID) | ORAL | Status: DC | PRN
  Administered 2018-10-22 – 2018-10-26 (×5): 1 via ORAL
  Filled 2018-10-20 (×7): qty 1

## 2018-10-20 MED ORDER — DOCUSATE SODIUM 100 MG PO CAPS
100.0000 mg | ORAL_CAPSULE | Freq: Two times a day (BID) | ORAL | Status: DC
  Administered 2018-10-20 – 2018-10-27 (×12): 100 mg via ORAL
  Filled 2018-10-20 (×12): qty 1

## 2018-10-20 MED ORDER — METHOCARBAMOL 500 MG PO TABS
500.0000 mg | ORAL_TABLET | Freq: Four times a day (QID) | ORAL | Status: DC | PRN
  Administered 2018-10-20: 500 mg via ORAL
  Filled 2018-10-20 (×2): qty 1

## 2018-10-20 MED ORDER — CEFAZOLIN SODIUM-DEXTROSE 2-4 GM/100ML-% IV SOLN
2.0000 g | INTRAVENOUS | Status: DC
  Filled 2018-10-20: qty 100

## 2018-10-20 MED ORDER — CHLORHEXIDINE GLUCONATE 4 % EX LIQD
CUTANEOUS | Status: AC
  Administered 2018-10-21: 06:00:00
  Filled 2018-10-20: qty 15

## 2018-10-20 MED ORDER — METHOCARBAMOL 1000 MG/10ML IJ SOLN
500.0000 mg | Freq: Four times a day (QID) | INTRAVENOUS | Status: DC | PRN
  Filled 2018-10-20: qty 5

## 2018-10-20 MED ORDER — ONDANSETRON HCL 4 MG/2ML IJ SOLN: 4.0000 mg | Freq: Four times a day (QID) | INTRAMUSCULAR | Status: DC | PRN

## 2018-10-20 MED ORDER — TRANEXAMIC ACID-NACL 1000-0.7 MG/100ML-% IV SOLN: 1000.0000 mg | INTRAVENOUS | Status: DC

## 2018-10-20 MED ORDER — CHLORHEXIDINE GLUCONATE 4 % EX LIQD
60.0000 mL | Freq: Once | CUTANEOUS | Status: AC
  Administered 2018-10-20: 4 via TOPICAL
  Filled 2018-10-20: qty 45

## 2018-10-20 MED ORDER — VITAMIN K1 10 MG/ML IJ SOLN
5.0000 mg | Freq: Once | INTRAVENOUS | Status: AC
  Administered 2018-10-20: 5 mg via INTRAVENOUS
  Filled 2018-10-20: qty 0.5

## 2018-10-20 MED ORDER — SOTALOL HCL 80 MG PO TABS
160.0000 mg | ORAL_TABLET | Freq: Two times a day (BID) | ORAL | Status: DC | PRN
  Administered 2018-10-21 (×2): 160 mg via ORAL
  Filled 2018-10-20 (×3): qty 2

## 2018-10-20 NOTE — ED Notes (Signed)
Ortho at bedside.

## 2018-10-20 NOTE — H&P (Signed)
History and Physical    [name]:[id number] [name]: 12/23/1928 DOA: 10/20/2018  PCP: Donald Garbe, MD Consultants:  [name] - orthopedics; Donald - cardiology; Leanord Hawking - wound Patient coming from:  Home - lives with wife; [name]: Wife, [phone]  Chief Complaint: Fall  HPI: [name] is a 85 y.o. male with medical history significant of pacemaker placement; and afib presenting with a fall.   He got out of bed and was moving into his electric scooter.  He lost his balance while transferring and fell on his side.   He uses a walker for transfers but uses his scooter most of the time.  This is due to scoliosis and decreased leg strength.  He also had a bad knee on his right leg and now injured his left leg.  No dizziness, mechanical fall only.  ED Course: Mechanical fall, on Coumadin.  Left proximal femoral neck fracture.  Dopplerable pulses, likely chronic.  Ortho to repair tomorrow afternoon.  Review of Systems: As per HPI; otherwise review of systems reviewed and negative.   Ambulatory Status:  Uses a walker or scooter  Past Medical History:  Diagnosis Date  . Atrial tachycardia (HCC)   . DDD (degenerative disc disease)   . DJD (degenerative joint disease)   . GERD (gastroesophageal reflux disease)   . Nocturia   . PAF (paroxysmal atrial fibrillation) (HCC)    on Coumadin  . Phlebitis   . Scoliosis   . Spinal stenosis   . Tachycardia-bradycardia syndrome (HCC)    s/p PPM by ST 04/08/10, RV lead has dislodged but  appears stable AAIR    Past Surgical History:  Procedure Laterality Date  . INGUINAL HERNIA REPAIR  1992  . PACEMAKER INSERTION  04/08/10   [employer] - Revo - implanted by Dr. Deborah Chalk 04/08/10  RV lead has dislodged, though pt is stable AAIR    Social History   Socioeconomic History  . Marital status: Married    Spouse name: Not on file  . Number of children: Not on file  . Years of education: Not on file  . Highest education level: Not on file    Occupational History  . Occupation: Retired Education officer, museum  . Financial resource strain: Not on file  . Food insecurity:    Worry: Not on file    Inability: Not on file  . Transportation needs:    Medical: Not on file    Non-medical: Not on file  Tobacco Use  . Smoking status: Never Smoker  . Smokeless tobacco: Never Used  Substance and Sexual Activity  . Alcohol use: No  . Drug use: No  . Sexual activity: Not on file  Lifestyle  . Physical activity:    Days per week: Not on file    Minutes per session: Not on file  . Stress: Not on file  Relationships  . Social connections:    Talks on phone: Not on file    Gets together: Not on file    Attends religious service: Not on file    Active member of club or organization: Not on file    Attends meetings of clubs or organizations: Not on file    Relationship status: Not on file  . Intimate partner violence:    Fear of current or ex partner: Not on file    Emotionally abused: Not on file    Physically abused: Not on file    Forced sexual activity: Not on file  Other  Topics Concern  . Not on file  Social History Narrative   Retired Optician, dispensingMinister.          No Known Allergies  Family History  Problem Relation Age of Onset  . Cancer Father   . Cancer Other     Prior to Admission medications   Medication Sig Start Date End Date Taking? Authorizing Provider  Ascorbic Acid 500 MG CAPS Take 500 mg by mouth 2 (two) times daily.   Yes [provider]  Coenzyme Q10 (CO Q 10 PO) Take 100 mg by mouth 2 (two) times daily.    Yes [provider]  fish oil-omega-3 fatty acids 1000 MG capsule Take 2 g by mouth daily.     Yes [provider]  HYDROcodone-acetaminophen (NORCO) 10-325 MG tablet Take 1 tablet by mouth 4 (four) times daily as needed for severe pain. Taking 1/2 tablet three times daily.   Yes [provider]  Multiple Vitamin (MULTIVITAMIN) tablet Take 1 tablet by mouth daily.     Yes [provider]  sotalol (BETAPACE) 160 MG tablet Take 160 mg by mouth 2 (two) times daily as needed for heart rate control.   Yes [provider]  VITAMIN D, CHOLECALCIFEROL, PO Take 5,000 Units by mouth daily.    Yes [provider]  VITAMIN E PO Take 400 Units by mouth daily.    Yes [provider]  warfarin (COUMADIN) 2 MG tablet Take as directed bu Dr [name] Patient taking differently: Take 5 mg by mouth as directed. Take as directed bu Dr [name].  Taking 5 mg each day [facility] taking 1/2 tab (2.5mg ) 10/28/13  Yes Donald, Fayrene FearingJames, MD  sotalol (BETAPACE) 160 MG tablet TAKE 1 TABLET BY MOUTH TWICE DAILY Patient not taking: Reported on 10/20/2018    Donald RangeAllred, James, MD    Physical Exam: Vitals:   10/20/18 1215 10/20/18 1300 10/20/18 1400 10/20/18 1415  BP: 140/71 115/89 110/87 120/70  Pulse:      Resp: 10  15 16   Temp:      TempSrc:      SpO2: 96%  94% 96%     . General:  Appears calm and comfortable and is NAD . Eyes:  PERRL, EOMI, normal lids, iris . ENT:  grossly normal hearing, lips & tongue, mmm; artificial dentition . Neck:  no LAD, masses or thyromegaly . Cardiovascular:  Irregularly irregular, rate controlled, no m/r/g. No LE edema.  Marland Kitchen. Respiratory:   CTA bilaterally with no wheezes/rales/rhonchi.  Normal respiratory effort. . Abdomen:  soft, NT, ND, NABS . Skin:  no rash or induration seen on limited exam . Musculoskeletal: Right knee with chronic effusion/deformity; left hip with acute bony protuberance and shortening/external rotation . Psychiatric:  grossly normal mood and affect, speech fluent and appropriate, AOx2-3 . Neurologic:  CN 2-12 grossly intact, appears to have chronically limited LE mobility; sensation intact    Radiological Exams on Admission: Dg Chest 1 View  Result Date: 10/20/2018 CLINICAL DATA:  Fall from wheelchair today. EXAM: CHEST  1 VIEW COMPARISON:  Radiographs of November 06, 2010.  FINDINGS: Stable cardiomegaly. Right-sided pacemaker is unchanged in position. Large hiatal hernia is noted. No pneumothorax or pleural effusion is noted. Both lungs are clear. The visualized skeletal structures are unremarkable. IMPRESSION: No acute cardiopulmonary abnormality seen. Large hiatal hernia is noted. Electronically Signed   By: Lupita RaiderJames  Green Jr, M.D.   On: 10/20/2018 11:46   Dg Lumbar Spine Complete  Result Date:  10/20/2018 CLINICAL DATA:  Low back pain after fall from wheelchair. EXAM: LUMBAR SPINE - COMPLETE 4+ VIEW COMPARISON:  None. FINDINGS: Moderate levoscoliosis of lumbar spine is noted. Diffuse osteopenia is noted. Probable old L5 compression fracture is noted. No acute fracture or spondylolisthesis is noted. Severe degenerative disc disease is noted at L1-2, L2-3 and L3-4. IMPRESSION: Probable old L5 compression fracture. Severe multilevel degenerative disc disease is noted. No definite acute abnormality seen in the lumbar spine. Electronically Signed   By: Lupita RaiderJames  Green Jr, M.D.   On: 10/20/2018 11:48   Dg Hip Unilat With Pelvis 2-3 Views Left  Result Date: 10/20/2018 CLINICAL DATA:  Left hip pain after fall out of wheelchair. EXAM: DG HIP (WITH OR WITHOUT PELVIS) 2-3V LEFT COMPARISON:  None. FINDINGS: Moderately displaced fracture is seen involving the proximal left femoral neck. Right hip appears normal. IMPRESSION: Moderately displaced proximal left femoral neck fracture. Electronically Signed   By: Lupita RaiderJames  Green Jr, M.D.   On: 10/20/2018 11:50    EKG: Independently reviewed.  NSR with rate 60; nonspecific ST changes with no evidence of acute ischemia; NSCSLT   Labs on Admission: I have personally reviewed the available labs and imaging studies at the time of the admission.  Pertinent labs:   Glucose 105 WBC 11.4 INR 2.21   Assessment/Plan Principal Problem:   Left displaced femoral neck fracture (HCC) Active Problems:   ATRIAL FIBRILLATION   Essential hypertension,  benign   Chronic pain syndrome   Memory loss   Left displaced hip fracture -Mechanical fall resulting in hip fracture -Orthopedics consulted -NPO after midnight in anticipation of surgical repair tomorrow -SCDs overnight, start Lovenox post-operatively (or as per ortho) -Pain control with Robxain, Vicodin, and Morphine prn -SW consult for rehab placement - [facility] -Will need PT consult post-operatively -Hip fracture order set utilized  Afib, on Coumadin -Rate controlled with pacemaker -Has prn Sotalol for breakthrough tachycardia -[facility] - given vitamin K to reverse his coagulopathy prior to surgery with plan to repeat INR in AM  HTN -Takes prn Sotalol, otherwise not on BP medications routinely  Chronic pain -Continue Norco prn with addition of morphine for breakthrough pain  Memory loss -Patient appeared to have some difficulty answering all questions -Wife confirms some question of early dementia  DVT prophylaxis:  SCDs until approved for Lovenox by orthopedics Code Status:  Full - confirmed with patient/family Family Communication: Wife present throughout evaluation  Disposition Plan:  Home once clinically improved Consults called: Orthopedics; SW, Nutrition; will need PT post-operatively  Admission status: Admit - It is my clinical opinion that admission to INPATIENT is reasonable and necessary because of the expectation that this patient will require hospital care that crosses at least 2 midnights to treat this condition based on the medical complexity of the problems presented.  Given the aforementioned information, the predictability of an adverse outcome is felt to be significant.     Jonah BlueJennifer Tariah Transue MD [facility]   How to contact the [facility] Attending or Consulting provider 7A - 7P or covering provider during after hours 7P -7A, for this patient?  1. Check the care team in [facility] and look for a) attending/consulting [facility] provider listed and  b) the [facility] team listed 2. Log into [email or web address] and use [place]'s universal password to access. If you do not have the password, please contact the hospital operator. 3. Locate the [facility] provider you are looking for under [facility] and page to a number  that you can be directly reached. 4. If you still have difficulty reaching the provider, please page the [facility] (Director on Call) for the Hospitalists listed on amion for assistance.   10/20/2018, 3:25 PM

## 2018-10-20 NOTE — ED Provider Notes (Signed)
[facility] Note   CSN: [id number] Arrival date & time: 10/20/18  1014     History   Chief Complaint No chief complaint on file.   HPI Georges MouseRalph C Waring is a 85 y.o. male with history of atrial fibrillation, hypertension, scoliosis, GERD, degenerative disc disease, degenerative joint disease presents for evaluation of acute onset, persistent left hip pain secondary to fall at around 6 AM.  He reports that he was attempting to transfer from his bed to his motorized wheelchair when he lost balance and fell, landing on his left hip.  The fall itself was unwitnessed but he remembers it, denies head injury or loss of consciousness.  He denies any headache, vision changes, neck pain, nausea, or vomiting.  He notes some low back pain which he reports is chronic for him. Denies numbness or tingling. Family reports they lifted him into bed but he has not been able to ambulate since fall. Per RN, EMS reported deformity to the left hip and a pelvic binder was placed. He received Fentanyl en route with improvement.   Currently anticoagulated on warfarin.  The history is provided by the patient.    Past Medical History:  Diagnosis Date  . Atrial tachycardia (HCC)   . DDD (degenerative disc disease)   . DJD (degenerative joint disease)   . GERD (gastroesophageal reflux disease)   . Nocturia   . PAF (paroxysmal atrial fibrillation) (HCC)    on Coumadin  . Phlebitis   . Scoliosis   . Spinal stenosis   . Tachycardia-bradycardia syndrome (HCC)    s/p PPM by ST 04/08/10, RV lead has dislodged but  appears stable AAIR    Patient Active Problem List   Diagnosis Date Noted  . Left displaced femoral neck fracture (HCC) 10/20/2018  . Memory loss 10/20/2018  . Long-term current use of opiate analgesic 09/24/2017  . Chronic low back pain 09/24/2017  . On long term drug therapy 09/18/2017  . Chronic neck pain 09/18/2017  . Chronic pain syndrome  09/18/2017  . Diarrhea, unspecified 06/02/2017  . Essential hypertension, benign 11/06/2010  . ATRIAL FIBRILLATION 08/07/2010  . BRADYCARDIA-TACHYCARDIA SYNDROME 08/07/2010    Past Surgical History:  Procedure Laterality Date  . INGUINAL HERNIA REPAIR  1992  . PACEMAKER INSERTION  04/08/10   [employer] - Revo - implanted by Dr. Deborah Chalkennant 04/08/10  RV lead has dislodged, though pt is stable AAIR        Home Medications    Prior to Admission medications   Medication Sig Start Date End Date Taking? Authorizing Provider  Ascorbic Acid 500 MG CAPS Take 500 mg by mouth 2 (two) times daily.   Yes [provider]  Coenzyme Q10 (CO Q 10 PO) Take 100 mg by mouth 2 (two) times daily.    Yes [provider]  fish oil-omega-3 fatty acids 1000 MG capsule Take 2 g by mouth daily.     Yes [provider]  HYDROcodone-acetaminophen (NORCO) 10-325 MG tablet Take 1 tablet by mouth 4 (four) times daily as needed for severe pain. Taking 1/2 tablet three times daily.   Yes [provider]  Multiple Vitamin (MULTIVITAMIN) tablet Take 1 tablet by mouth daily.    Yes [provider]  sotalol (BETAPACE) 160 MG tablet Take 160 mg by mouth 2 (two) times daily as needed for heart rate control.   Yes [provider]  VITAMIN D, CHOLECALCIFEROL, PO Take 5,000 Units by mouth daily.  Yes [provider]  VITAMIN E PO Take 400 Units by mouth daily.    Yes [provider]  warfarin (COUMADIN) 2 MG tablet Take as directed bu Dr Wylene Simmerisovec Patient taking differently: Take 5 mg by mouth as directed. Take as directed bu Dr Wylene Simmerisovec.  Taking 5 mg each day [facility] taking 1/2 tab (2.5mg ) 10/28/13  Yes Hillis RangeAllred, James, MD    Family History Family History  Problem Relation Age of Onset  . Cancer Father   . Cancer Other     Social History Social History   Tobacco Use  . Smoking status: Never Smoker  . Smokeless tobacco: Never Used    Substance Use Topics  . Alcohol use: No  . Drug use: No     Allergies   Patient has no known allergies.   Review of Systems Review of Systems  Constitutional: Negative for chills and fever.  Eyes: Negative for visual disturbance.  Respiratory: Negative for shortness of breath.   Cardiovascular: Negative for chest pain.  Gastrointestinal: Negative for abdominal pain, nausea and vomiting.  Neurological: Negative for headaches.  Hematological: Bruises/bleeds easily.  All other systems reviewed and are negative.    Physical Exam Updated Vital Signs BP 120/70   Pulse (!) 59   Temp (!) 97.5 F (36.4 C) (Oral)   Resp 16   SpO2 96%   Physical Exam Vitals signs and nursing note reviewed.  Constitutional:      General: He is not in acute distress.    Appearance: He is well-developed.  HENT:     Head: Normocephalic and atraumatic.  Eyes:     General:        Right eye: No discharge.        Left eye: No discharge.     Conjunctiva/sclera: Conjunctivae normal.  Neck:     Musculoskeletal: Normal range of motion and neck supple.     Vascular: No JVD.     Trachea: No tracheal deviation.     Comments: No midline spine TTP, no paraspinal muscle tenderness, no deformity, crepitus, or step-off noted  Cardiovascular:     Rate and Rhythm: Normal rate.     Comments: Chronic venous stasis skin changes of the bilateral lower extremities.  Patient reports this is unchanged.  He does have 1+ pitting edema of the distal left lower leg.  DP/PT pulses are dopplerable, stronger on the right as compared to the left. Pulmonary:     Effort: Pulmonary effort is normal.     Breath sounds: Normal breath sounds.  Chest:     Chest wall: No tenderness.  Abdominal:     General: Abdomen is flat. There is no distension.     Tenderness: There is no abdominal tenderness. There is no guarding or rebound.  Musculoskeletal:     Comments: Pelvic binder in place. No midline spine TTP, no paraspinal  muscle tenderness, no deformity, crepitus, or step-off noted. Chronic joint changes noted to the knees bilaterally with no noted instability. 5/5 strength of major muscle groups and with plantarflexion and dorsiflexion of the bilateral lower extremities.  Skin:    General: Skin is warm and dry.     Findings: No erythema.  Neurological:     Mental Status: He is alert.     Comments: Cranial nerves appear to be grossly intact.  Fluent speech with no evidence of dysarthria or aphasia, no facial droop.  Able to answer history questions coherently.  Sensation intact to soft touch  of extremities.  Psychiatric:        Behavior: Behavior normal.      ED Treatments / Results  Labs (all labs ordered are listed, but only abnormal results are displayed) Labs Reviewed  CBC WITH DIFFERENTIAL/PLATELET - Abnormal; Notable for the following components:      Result Value   WBC 11.4 (*)    RBC 4.15 (*)    MCV 101.9 (*)    Neutro Abs 9.3 (*)    All other components within normal limits  PROTIME-INR - Abnormal; Notable for the following components:   Prothrombin Time 24.2 (*)    All other components within normal limits  BASIC METABOLIC PANEL - Abnormal; Notable for the following components:   Chloride 96 (*)    Glucose, Bld 105 (*)    All other components within normal limits    EKG EKG Interpretation  Date/Time:  Wednesday October 20 2018 11:37:36 EST Ventricular Rate:  60 PR Interval:    QRS Duration: 94 QT Interval:  476 QTC Calculation: 476 R Axis:   29 Text Interpretation:  Sinus rhythm Borderline prolonged QT interval Artifact in lead(s) I II III aVR aVL aVF V1 V4 V5 V6 No significant change since last tracing Confirmed by Gwyneth Sprout ([id number]) on 10/20/2018 11:45:20 AM Also confirmed by Gwyneth Sprout ([id number]), editor Barbette Hair [phone])  on 10/20/2018 2:03:43 PM   Radiology Dg Chest 1 View  Result Date: 10/20/2018 CLINICAL DATA:  Fall from wheelchair today. EXAM: CHEST  1  VIEW COMPARISON:  Radiographs of November 06, 2010. FINDINGS: Stable cardiomegaly. Right-sided pacemaker is unchanged in position. Large hiatal hernia is noted. No pneumothorax or pleural effusion is noted. Both lungs are clear. The visualized skeletal structures are unremarkable. IMPRESSION: No acute cardiopulmonary abnormality seen. Large hiatal hernia is noted. Electronically Signed   By: Lupita Raider, M.D.   On: 10/20/2018 11:46   Dg Lumbar Spine Complete  Result Date: 10/20/2018 CLINICAL DATA:  Low back pain after fall from wheelchair. EXAM: LUMBAR SPINE - COMPLETE 4+ VIEW COMPARISON:  None. FINDINGS: Moderate levoscoliosis of lumbar spine is noted. Diffuse osteopenia is noted. Probable old L5 compression fracture is noted. No acute fracture or spondylolisthesis is noted. Severe degenerative disc disease is noted at L1-2, L2-3 and L3-4. IMPRESSION: Probable old L5 compression fracture. Severe multilevel degenerative disc disease is noted. No definite acute abnormality seen in the lumbar spine. Electronically Signed   By: Lupita Raider, M.D.   On: 10/20/2018 11:48   Dg Hip Unilat With Pelvis 2-3 Views Left  Result Date: 10/20/2018 CLINICAL DATA:  Left hip pain after fall out of wheelchair. EXAM: DG HIP (WITH OR WITHOUT PELVIS) 2-3V LEFT COMPARISON:  None. FINDINGS: Moderately displaced fracture is seen involving the proximal left femoral neck. Right hip appears normal. IMPRESSION: Moderately displaced proximal left femoral neck fracture. Electronically Signed   By: Lupita Raider, M.D.   On: 10/20/2018 11:50    Procedures Procedures (including critical care time)  Medications Ordered in ED Medications  HYDROcodone-acetaminophen (NORCO) 10-325 MG per tablet 1 tablet (has no administration in time range)  sotalol (BETAPACE) tablet 160 mg (has no administration in time range)  morphine 2 MG/ML injection 0.5 mg (has no administration in time range)  methocarbamol (ROBAXIN) tablet 500 mg (has  no administration in time range)    Or  methocarbamol (ROBAXIN) 500 mg in dextrose 5 % 50 mL IVPB (has no administration in time range)  docusate sodium (COLACE) capsule 100  mg (has no administration in time range)  ondansetron (ZOFRAN) injection 4 mg (has no administration in time range)  phytonadione (VITAMIN K) 5 mg in dextrose 5 % 50 mL IVPB (0 mg Intravenous Stopped 10/20/18 1509)  acetaminophen (TYLENOL) tablet 650 mg (650 mg Oral Given 10/20/18 1355)     Initial Impression / Assessment and Plan / ED Course  I have reviewed the triage vital signs and the nursing notes.  Pertinent labs & imaging results that were available during my care of the patient were reviewed by me and considered in my medical decision making (see chart for details).     Patient presents for evaluation of acute onset left hip pain secondary to mechanical fall this morning.  He is afebrile, vital signs are stable.  He is nontoxic in appearance.  He is neurovascularly intact.  Peripheral pulses are faint but dopplerable.  Does have chronic venous stasis skin changes on exam.  No head injury or loss of consciousness, no signs of serious head injury on examination.  No evidence of acute intrathoracic or intra-abdominal injury and he has no midline spine tenderness on examination.  Radiographs significant for moderately displaced proximal left femoral neck fracture.  Also has a probable old L5 compression fracture.  Lab work reviewed by me shows mild nonspecific leukocytosis, no anemia, no metabolic derangements.  INR of 2.21.  EKG shows no significant changes from last tracing. Charma Igo with orthopedics has seen and evaluated the patient, recommends OR repair tomorrow with Dr. Eulah Pont.  Will require vitamin K for anticoagulation reversal.  Spoke with Dr. Ophelia Charter with [facility] who agrees to assume care of patient and bring him into the hospital for further evaluation and management.  Patient seen and  evaluated by Dr. Anitra Lauth who agrees with assessment and plan at this time.  Final Clinical Impressions(s) / ED Diagnoses   Final diagnoses:  Closed fracture of neck of left femur, initial encounter [facility])    ED Discharge Orders    None       Bennye Alm 10/20/18 1605    Gwyneth Sprout, MD 10/23/18 1538

## 2018-10-20 NOTE — Consult Note (Addendum)
Reason for Consult:Left hip fx Referring Physician: W Leona Carrylunkett  [name] is an 85 y.o. male.  HPI: [name] either fell out of bed or while standing next to it, he's not quite sure. He fell onto his left side and had immediate pain and couldn't get up. He was brought to the ED where x-rays showed a left femoral neck fx and orthopedic surgery was consulted. He mostly gets around on a sort of scooter but does do some limited walking with a walker. He's unsure what caused the fall but denies syncope.  Past Medical History:  Diagnosis Date  . Atrial tachycardia (HCC)   . DDD (degenerative disc disease)   . DJD (degenerative joint disease)   . GERD (gastroesophageal reflux disease)   . Nocturia   . PAF (paroxysmal atrial fibrillation) (HCC)   . Phlebitis   . Scoliosis   . Spinal stenosis   . Tachycardia-bradycardia syndrome (HCC)    s/p PPM by ST 04/08/10, RV lead has dislodged but  appears stable AAIR    Past Surgical History:  Procedure Laterality Date  . INGUINAL HERNIA REPAIR  1992  . PACEMAKER INSERTION  04/08/10   [employer] - Revo - implanted by Dr. Deborah Chalkennant 04/08/10  RV lead has dislodged, though pt is stable AAIR    Family History  Problem Relation Age of Onset  . Cancer Father   . Cancer Unknown     Social History:  reports that he has never smoked. He has never used smokeless tobacco. He reports that he does not drink alcohol or use drugs.  Allergies: No Known Allergies  Medications: I have reviewed the patient's current medications.  Results for orders placed or performed during the hospital encounter of 10/20/18 (from the past 48 hour(s))  CBC with Differential     Status: Abnormal   Collection Time: 10/20/18 10:39 AM  Result Value Ref Range   WBC 11.4 (H) 4.0 - 10.5 K/uL   RBC 4.15 (L) 4.22 - 5.81 MIL/uL   Hemoglobin 13.4 13.0 - 17.0 g/dL   HCT 16.142.3 09.639.0 - 04.552.0 %   MCV 101.9 (H) 80.0 - 100.0 fL   MCH 32.3 26.0 - 34.0 pg   MCHC 31.7 30.0 - 36.0 g/dL   RDW 40.914.1  81.111.5 - 91.415.5 %   Platelets 179 150 - 400 K/uL   nRBC 0.0 0.0 - 0.2 %   Neutrophils Relative % 82 %   Neutro Abs 9.3 (H) 1.7 - 7.7 K/uL   Lymphocytes Relative 9 %   Lymphs Abs 1.0 0.7 - 4.0 K/uL   Monocytes Relative 9 %   Monocytes Absolute 1.0 0.1 - 1.0 K/uL   Eosinophils Relative 0 %   Eosinophils Absolute 0.0 0.0 - 0.5 K/uL   Basophils Relative 0 %   Basophils Absolute 0.0 0.0 - 0.1 K/uL   Immature Granulocytes 0 %   Abs Immature Granulocytes 0.04 0.00 - 0.07 K/uL    Comment: Performed at [facility], [address] [place], [place] [address]    Dg Chest 1 View  Result Date: 10/20/2018 CLINICAL DATA:  Fall from wheelchair today. EXAM: CHEST  1 VIEW COMPARISON:  Radiographs of November 06, 2010. FINDINGS: Stable cardiomegaly. Right-sided pacemaker is unchanged in position. Large hiatal hernia is noted. No pneumothorax or pleural effusion is noted. Both lungs are clear. The visualized skeletal structures are unremarkable. IMPRESSION: No acute cardiopulmonary abnormality seen. Large hiatal hernia is noted. Electronically Signed   By: Lupita RaiderJames  Green Potts,  M.D.   On: 10/20/2018 11:46   Dg Lumbar Spine Complete  Result Date: 10/20/2018 CLINICAL DATA:  Low back pain after fall from wheelchair. EXAM: LUMBAR SPINE - COMPLETE 4+ VIEW COMPARISON:  None. FINDINGS: Moderate levoscoliosis of lumbar spine is noted. Diffuse osteopenia is noted. Probable old L5 compression fracture is noted. No acute fracture or spondylolisthesis is noted. Severe degenerative disc disease is noted at L1-2, L2-3 and L3-4. IMPRESSION: Probable old L5 compression fracture. Severe multilevel degenerative disc disease is noted. No definite acute abnormality seen in the lumbar spine. Electronically Signed   By: Lupita RaiderJames  Green Potts, M.D.   On: 10/20/2018 11:48   Dg Hip Unilat With Pelvis 2-3 Views Left  Result Date: 10/20/2018 CLINICAL DATA:  Left hip pain after fall out of wheelchair. EXAM: DG HIP (WITH OR WITHOUT PELVIS) 2-3V  LEFT COMPARISON:  None. FINDINGS: Moderately displaced fracture is seen involving the proximal left femoral neck. Right hip appears normal. IMPRESSION: Moderately displaced proximal left femoral neck fracture. Electronically Signed   By: Lupita RaiderJames  Green Potts, M.D.   On: 10/20/2018 11:50    Review of Systems  Constitutional: Negative for weight loss.  HENT: Negative for ear discharge, ear pain, hearing loss and tinnitus.   Eyes: Negative for blurred vision, double vision, photophobia and pain.  Respiratory: Negative for cough, sputum production and shortness of breath.   Cardiovascular: Negative for chest pain.  Gastrointestinal: Negative for abdominal pain, nausea and vomiting.  Genitourinary: Negative for dysuria, flank pain, frequency and urgency.  Musculoskeletal: Positive for joint pain (Left hip). Negative for back pain, falls, myalgias and neck pain.  Neurological: Negative for dizziness, tingling, sensory change, focal weakness, loss of consciousness and headaches.  Endo/Heme/Allergies: Does not bruise/bleed easily.  Psychiatric/Behavioral: Negative for depression, memory loss and substance abuse. The patient is not nervous/anxious.    Blood pressure (!) 143/74, pulse (!) 58, temperature (!) 97.5 F (36.4 C), temperature source Oral, resp. rate 18, SpO2 95 %. Physical Exam  Constitutional: He appears well-developed and well-nourished. No distress.  HENT:  Head: Normocephalic and atraumatic.  Eyes: Conjunctivae are normal. Right eye exhibits no discharge. Left eye exhibits no discharge. No scleral icterus.  Neck: Normal range of motion.  Cardiovascular: Normal rate and regular rhythm.  Respiratory: Effort normal. No respiratory distress.  Musculoskeletal:     Comments: LLE No traumatic wounds, ecchymosis, or rash  TTP hip, leg in varus  No knee or ankle effusion  Knee stable to varus/ valgus and anterior/posterior stress  Sens DPN, SPN, TN intact  Motor EHL, ext, flex, evers 5/5  DP  1+, PT 1+, No significant edema  Neurological: He is alert.  Skin: Skin is warm and dry. He is not diaphoretic.  Psychiatric: He has a normal mood and affect. His behavior is normal.    Assessment/Plan: Left femoral neck fx -- Will need hip hemi, will plan on tomorrow at 2 with Dr. Eulah PontMurphy as long as INR acceptable. Will give vitamin K. Multiple medical problems including atrial fibrillation, hypertension, scoliosis, GERD, degenerative disc disease, and degenerative joint disease -- per IM who will admit and manage, appreciate their assistance    Freeman CaldronMichael J. Jeffery, PA-C Orthopedic Surgery [phone] 10/20/2018, 12:12 PM   I have seen and examined the patient. I agree with the findings above.   I discussed with the patient the risks and benefits of hemiarthroplasty for his left hip, including the possibility of infection, nerve injury, vessel injury, wound breakdown, arthritis, symptomatic hardware, DVT/ PE, limb  length discrepancy, loss of motion, malunion, nonunion, and need for further surgery among others.  He acknowledged these risks and wished to proceed.   Budd Palmer, MD 10/21/2018 7:48 AM

## 2018-10-20 NOTE — Anesthesia Preprocedure Evaluation (Addendum)
Anesthesia Evaluation  Patient identified by MRN, date of birth, ID band Patient confused    Reviewed: Allergy & Precautions, NPO status , Patient's Chart, lab work & pertinent test results, reviewed documented beta blocker date and time   History of Anesthesia Complications Negative for: history of anesthetic complications  Airway Mallampati: III   Neck ROM: Full  Mouth opening: Limited Mouth Opening  Dental  (+) Upper Dentures, Lower Dentures   Pulmonary neg pulmonary ROS,    breath sounds clear to auscultation + decreased breath sounds      Cardiovascular hypertension, Pt. on home beta blockers + dysrhythmias Atrial Fibrillation + pacemaker  Rhythm:Irregular Rate:Normal  ECG: rate 60. Sinus rhythm Borderline prolonged QT interval  Remote device check reviewed. Normal device function. Battery status, leads stable. Histograms reviewed and appropriate.  Routine follow-up  Sees cardiologist ([facility])   Neuro/Psych negative neurological ROS  negative psych ROS   GI/Hepatic Neg liver ROS, GERD  Controlled,  Endo/Other  negative endocrine ROS  Renal/GU negative Renal ROS     Musculoskeletal  (+) Arthritis , Spinal stenosis Chronic low back pain Chronic neck pain Chronic pain syndrome     Abdominal   Peds  Hematology  Thrombocytopenia    Anesthesia Other Findings fractured hip  Reproductive/Obstetrics                          Anesthesia Physical Anesthesia Plan  ASA: IV  Anesthesia Plan: General   Post-op Pain Management:    Induction: Intravenous  PONV Risk Score and Plan: 2 and Ondansetron, Treatment may vary due to age or medical condition and Propofol infusion  Airway Management Planned: Oral ETT  Additional Equipment: None  Intra-op Plan:   Post-operative Plan: Extubation in OR  Informed Consent: I have reviewed the patients History and Physical, chart, labs and  discussed the procedure including the risks, benefits and alternatives for the proposed anesthesia with the patient or authorized representative who has indicated his/her understanding and acceptance.     Consent reviewed with POA  Plan Discussed with: CRNA and Anesthesiologist  Anesthesia Plan Comments:      Anesthesia Quick Evaluation

## 2018-10-20 NOTE — ED Notes (Signed)
Pharmacy aware we have not yet received vitamin K.

## 2018-10-20 NOTE — ED Notes (Signed)
ED Provider at bedside. 

## 2018-10-20 NOTE — ED Triage Notes (Addendum)
Per EMS: Pt from home due to a fall as pt was transferring from bed to W/C.  Pt family reports frequent falls.  Pt presents with a pelvic binder and C-Collar.  Pt reports no LOC.  Left hip pain and deformity. Pt was administered of Fentanyl PTA by EMS.  Pt on BT.

## 2018-10-21 ENCOUNTER — Encounter (HOSPITAL_COMMUNITY)
Admission: EM | Disposition: A | Discharge status: Skilled Nursing Facility | Payer: Self-pay | Source: Home / Self Care | Attending: Internal Medicine

## 2018-10-21 ENCOUNTER — Encounter (HOSPITAL_COMMUNITY): Payer: Self-pay | Admitting: *Deleted

## 2018-10-21 DIAGNOSIS — Z01810 Encounter for preprocedural cardiovascular examination: Secondary | ICD-10-CM

## 2018-10-21 DIAGNOSIS — R413 Other amnesia: Secondary | ICD-10-CM

## 2018-10-21 DIAGNOSIS — I10 Essential (primary) hypertension: Secondary | ICD-10-CM

## 2018-10-21 DIAGNOSIS — I480 Paroxysmal atrial fibrillation: Secondary | ICD-10-CM

## 2018-10-21 DIAGNOSIS — D696 Thrombocytopenia, unspecified: Secondary | ICD-10-CM

## 2018-10-21 DIAGNOSIS — E43 Unspecified severe protein-calorie malnutrition: Secondary | ICD-10-CM

## 2018-10-21 DIAGNOSIS — G894 Chronic pain syndrome: Secondary | ICD-10-CM

## 2018-10-21 DIAGNOSIS — D72829 Elevated white blood cell count, unspecified: Secondary | ICD-10-CM

## 2018-10-21 LAB — CBC WITH DIFFERENTIAL/PLATELET
Abs Immature Granulocytes: 0 10*3/uL (ref 0.00–0.07)
Basophils Absolute: 0.1 10*3/uL (ref 0.0–0.1)
Basophils Relative: 1 %
EOS PCT: 1 %
Eosinophils Absolute: 0.1 10*3/uL (ref 0.0–0.5)
HEMATOCRIT: 39.9 % (ref 39.0–52.0)
Hemoglobin: 13.4 g/dL (ref 13.0–17.0)
Lymphocytes Relative: 9 %
Lymphs Abs: 1.1 10*3/uL (ref 0.7–4.0)
MCH: 33.8 pg (ref 26.0–34.0)
MCHC: 33.6 g/dL (ref 30.0–36.0)
MCV: 100.8 fL — AB (ref 80.0–100.0)
Monocytes Absolute: 0.7 10*3/uL (ref 0.1–1.0)
Monocytes Relative: 6 %
NRBC: 0 % (ref 0.0–0.2)
Neutro Abs: 9.8 10*3/uL — ABNORMAL HIGH (ref 1.7–7.7)
Neutrophils Relative %: 83 %
Platelets: 138 10*3/uL — ABNORMAL LOW (ref 150–400)
RBC: 3.96 MIL/uL — ABNORMAL LOW (ref 4.22–5.81)
RDW: 13.4 % (ref 11.5–15.5)
WBC: 11.8 10*3/uL — ABNORMAL HIGH (ref 4.0–10.5)
nRBC: 0 /100 WBC

## 2018-10-21 LAB — PROTIME-INR
INR: 1.5
Prothrombin Time: 17.9 seconds — ABNORMAL HIGH (ref 11.4–15.2)

## 2018-10-21 SURGERY — HEMIARTHROPLASTY, HIP, DIRECT ANTERIOR APPROACH, FOR FRACTURE
Anesthesia: Choice | Laterality: Left

## 2018-10-21 MED ORDER — DIGOXIN 0.25 MG/ML IJ SOLN
0.2500 mg | Freq: Four times a day (QID) | INTRAMUSCULAR | Status: AC
  Administered 2018-10-21 – 2018-10-22 (×2): 0.25 mg via INTRAVENOUS
  Filled 2018-10-21 (×2): qty 1

## 2018-10-21 MED ORDER — ENSURE ENLIVE PO LIQD
237.0000 mL | Freq: Three times a day (TID) | ORAL | Status: DC
  Administered 2018-10-23 – 2018-10-29 (×20): 237 mL via ORAL

## 2018-10-21 MED ORDER — ESMOLOL HCL 100 MG/10ML IV SOLN
INTRAVENOUS | Status: DC | PRN
  Administered 2018-10-21 (×3): 30 mg via INTRAVENOUS
  Administered 2018-10-21: 10 mg via INTRAVENOUS
  Administered 2018-10-22: 20 mg via INTRAVENOUS
  Administered 2018-10-22: 10 mg via INTRAVENOUS

## 2018-10-21 MED ORDER — METOPROLOL TARTRATE 5 MG/5ML IV SOLN
INTRAVENOUS | Status: AC
  Administered 2018-10-21: 2.5 mg via INTRAVENOUS
  Filled 2018-10-21: qty 5

## 2018-10-21 MED ORDER — METOPROLOL TARTRATE 5 MG/5ML IV SOLN
2.5000 mg | INTRAVENOUS | Status: AC | PRN
  Administered 2018-10-21 (×2): 2.5 mg via INTRAVENOUS

## 2018-10-21 MED ORDER — VASOPRESSIN 20 UNIT/ML IV SOLN
INTRAVENOUS | Status: AC
  Filled 2018-10-21: qty 1

## 2018-10-21 MED ORDER — PROPOFOL 10 MG/ML IV BOLUS
INTRAVENOUS | Status: AC
  Filled 2018-10-21: qty 20

## 2018-10-21 MED ORDER — ADULT MULTIVITAMIN W/MINERALS CH
1.0000 | ORAL_TABLET | Freq: Every day | ORAL | Status: DC
  Administered 2018-10-21 – 2018-10-30 (×9): 1 via ORAL
  Filled 2018-10-21 (×9): qty 1

## 2018-10-21 MED ORDER — CEFAZOLIN SODIUM-DEXTROSE 2-4 GM/100ML-% IV SOLN
2.0000 g | INTRAVENOUS | Status: AC
  Filled 2018-10-21 (×2): qty 100

## 2018-10-21 MED ORDER — SODIUM CHLORIDE 0.9 % IV BOLUS
500.0000 mL | Freq: Once | INTRAVENOUS | Status: AC
  Administered 2018-10-21: 500 mL via INTRAVENOUS

## 2018-10-21 MED ORDER — FENTANYL CITRATE (PF) 250 MCG/5ML IJ SOLN
INTRAMUSCULAR | Status: AC
  Filled 2018-10-21: qty 5

## 2018-10-21 MED ORDER — LACTATED RINGERS IV SOLN
INTRAVENOUS | Status: DC
  Administered 2018-10-21: 08:00:00 via INTRAVENOUS

## 2018-10-21 MED ORDER — POVIDONE-IODINE 10 % EX SWAB: 2.0000 "application " | Freq: Once | CUTANEOUS | Status: DC

## 2018-10-21 MED ORDER — 0.9 % SODIUM CHLORIDE (POUR BTL) OPTIME: TOPICAL | Status: DC | PRN

## 2018-10-21 MED ORDER — VANCOMYCIN HCL 1000 MG IV SOLR
INTRAVENOUS | Status: AC
  Filled 2018-10-21: qty 1000

## 2018-10-21 MED ORDER — SODIUM CHLORIDE (PF) 0.9 % IJ SOLN
INTRAMUSCULAR | Status: AC
  Filled 2018-10-21: qty 10

## 2018-10-21 MED ORDER — MUPIROCIN 2 % EX OINT
1.0000 "application " | TOPICAL_OINTMENT | Freq: Two times a day (BID) | CUTANEOUS | Status: AC
  Administered 2018-10-21 – 2018-10-25 (×9): 1 via NASAL
  Filled 2018-10-21 (×2): qty 22

## 2018-10-21 NOTE — Progress Notes (Signed)
Patient given intravenous esmolol and metoprolol. Heart rate continues to fluctuate between 100-120. Will likely need more aggressive heart rate control prior to surgery. Hospitalist and Surgeon notified.

## 2018-10-21 NOTE — Progress Notes (Signed)
Surgery cancelled per anesthesia. Report called to [facility], Charity fundraiser. Verbalized understanding. Patient transported back to floor with SS RN.

## 2018-10-21 NOTE — Progress Notes (Signed)
Metoprolol 5mg  total administered per anesthesia. No change in heart rate at this time. Anesthesia made aware. Surgeon and anesthesiologist at bedside speaking with patient and family.

## 2018-10-21 NOTE — Progress Notes (Signed)
Repeated efforts on behalf of Anesthesia have failed to control heart rate. As a result we will have to cancel today and reschedule for tomorrow at 7:30am. Have discussed with family, as well, who is in agreement.  Will recheck CBC and communicate with Medical service.  Myrene Galas, MD [facility], [facility] [phone]

## 2018-10-21 NOTE — Progress Notes (Signed)
Patient arrived to unit with HR 122, BP 110/78. Anesthesia notified. Orders received and carried out.

## 2018-10-21 NOTE — Progress Notes (Signed)
RN paged MD because pt BP in low 90's awaiting call back for further orders. Will continue to monitor

## 2018-10-21 NOTE — Consult Note (Signed)
Cardiology Consultation:   Patient ID: [name] MRN: [id number]; [name]: 09-17-1928  Admit date: 10/20/2018 Date of Consult: 10/21/2018  Primary Care Provider: [name], MD Primary Cardiologist: Dr [name] Primary Electrophysiologist:  Dr [name]   Patient Profile:   [name] is a 85 y.o. male with a hx of paroxysmal atrial fibrillation, atrial tachycardia, prior pacemaker who is being seen today for preoperative evaluation prior to repair of hip fracture and paroxysmal atrial fibrillation at the request of Merlene Laughter DO.    History of Present Illness:   Last echocardiogram July 2011 showed normal LV function and mild left atrial enlargement. Previously had pacemaker placed in August 2011.  RV lead has dislodged but patient stable AAIR.  Patient admitted after falling on February 12.  He was found to have left femoral neck fracture and surgery is planned.  Cardiology asked to evaluate preoperatively.  Patient also with history of paroxysmal atrial fibrillation maintained on sotalol and Coumadin.  Initial electrocardiogram on February 12 difficult but appears to show sinus rhythm.  Patient has subsequently developed atrial fibrillation since admission.  Surgery has been delayed because of tachycardia.  Patient is somewhat confused at the time of this evaluation because of pain medications.  History is obtained from him but also with assistance of his wife.  He apparently falls every 2 months or so.  He has problems with back pain and he therefore ambulates very little.  He typically transfers from the bed to a scooter.  She thinks he likely fell yesterday while making this transfer.  He denies dyspnea, chest pain, palpitations or syncope.  Note last remote showed 38.4% atrial fibrillation burden.  Past Medical History:  Diagnosis Date  . Atrial tachycardia (HCC)   . DDD (degenerative disc disease)   . DJD (degenerative joint disease)   . GERD (gastroesophageal  reflux disease)   . Nocturia   . PAF (paroxysmal atrial fibrillation) (HCC)    on Coumadin  . Phlebitis   . Scoliosis   . Spinal stenosis   . Tachycardia-bradycardia syndrome (HCC)    s/p PPM by ST 04/08/10, RV lead has dislodged but  appears stable AAIR    Past Surgical History:  Procedure Laterality Date  . INGUINAL HERNIA REPAIR  1992  . PACEMAKER INSERTION  04/08/10   [employer] - Revo - implanted by Dr. Deborah Chalk 04/08/10  RV lead has dislodged, though pt is stable AAIR       Inpatient Medications: Scheduled Meds: . docusate sodium  100 mg Oral BID  . feeding supplement (ENSURE ENLIVE)  237 mL Oral TID BM  . multivitamin with minerals  1 tablet Oral Daily  . mupirocin ointment  1 application Nasal BID   Continuous Infusions: .  ceFAZolin (ANCEF) IV    . lactated ringers 10 mL/hr at 10/21/18 0754  . methocarbamol (ROBAXIN) IV    . sodium chloride 500 mL (10/21/18 1819)   PRN Meds: HYDROcodone-acetaminophen, methocarbamol **OR** methocarbamol (ROBAXIN) IV, morphine injection, ondansetron (ZOFRAN) IV, sotalol  Allergies:   No Known Allergies  Social History:   Social History   Socioeconomic History  . Marital status: Married    Spouse name: Not on file  . Number of children: Not on file  . Years of education: Not on file  . Highest education level: Not on file  Occupational History  . Occupation: Retired Education officer, museum  . Financial resource strain: Not on file  . Food insecurity:    Worry:  Not on file    Inability: Not on file  . Transportation needs:    Medical: Not on file    Non-medical: Not on file  Tobacco Use  . Smoking status: Never Smoker  . Smokeless tobacco: Never Used  Substance and Sexual Activity  . Alcohol use: No  . Drug use: No  . Sexual activity: Not on file  Lifestyle  . Physical activity:    Days per week: Not on file    Minutes per session: Not on file  . Stress: Not on file  Relationships  . Social connections:    Talks on  phone: Not on file    Gets together: Not on file    Attends religious service: Not on file    Active member of club or organization: Not on file    Attends meetings of clubs or organizations: Not on file    Relationship status: Not on file  . Intimate partner violence:    Fear of current or ex partner: Not on file    Emotionally abused: Not on file    Physically abused: Not on file    Forced sexual activity: Not on file  Other Topics Concern  . Not on file  Social History Narrative   Retired Optician, dispensingMinister.          Family History:   Family History  Problem Relation Age of Onset  . Cancer Father   . Cancer Other      ROS:  Please see the history of present illness.  Hip and back pain but no fevers, chills, productive cough, hemoptysis or melena. All other ROS reviewed and negative.     Physical Exam/Data:   Vitals:   10/21/18 0918 10/21/18 0953 10/21/18 1346 10/21/18 1612  BP:  92/64 (!) 90/56 (!) 93/56  Pulse:  99 (!) 122 (!) 103  Resp: (!) 34   19  Temp:  97.8 F (36.6 C) 98.7 F (37.1 C)   TempSrc:  Oral Oral   SpO2:  97% 95% 95%  Weight:      Height:        Intake/Output Summary (Last 24 hours) at 10/21/2018 1851 Last data filed at 10/21/2018 1100 Gross per 24 hour  Intake 360 ml  Output 850 ml  Net -490 ml   Last 3 Weights 10/21/2018 10/20/2018 05/16/2016  Weight (lbs) 143 lb 11.8 oz 143 lb 11.8 oz 153 lb  Weight (kg) 65.2 kg 65.2 kg 69.4 kg     Body mass index is 23.2 kg/m.  General:  Well developed, frail, in no acute distress HEENT: normal Lymph: no adenopathy Neck: no JVD Endocrine:  No thryomegaly Vascular: No carotid bruits; FA pulses 2+ bilaterally without bruits  Cardiac:  normal S1, S2; irregular, no murmur Lungs:  clear to auscultation bilaterally, no wheezing, rhonchi or rales  Abd: soft, nontender, no hepatomegaly  Ext: no edema; chronic skin changes; s/p left hip fracture Musculoskeletal:  No deformities, BUE and BLE strength normal and  equal Skin: warm and dry  Neuro:  CNs 2-12 intact, no focal abnormalities noted Psych:  Normal affect   EKG:  The EKG was personally reviewed and demonstrates: Probable sinus rhythm Telemetry:  Telemetry was personally reviewed and demonstrates: Atrial fibrillation with PVCs or aberrantly conducted beats.  Rate mildly elevated.  Laboratory Data:  Chemistry Recent Labs  Lab 10/20/18 1318  NA 136  K 4.0  CL 96*  CO2 29  GLUCOSE 105*  BUN 16  CREATININE 0.72  CALCIUM 9.1  GFRNONAA >60  GFRAA >60  ANIONGAP 11    Hematology Recent Labs  Lab 10/20/18 1039 10/21/18 0857  WBC 11.4* 11.8*  RBC 4.15* 3.96*  HGB 13.4 13.4  HCT 42.3 39.9  MCV 101.9* 100.8*  MCH 32.3 33.8  MCHC 31.7 33.6  RDW 14.1 13.4  PLT 179 138*    Radiology/Studies:  Dg Chest 1 View  Result Date: 10/20/2018 CLINICAL DATA:  Fall from wheelchair today. EXAM: CHEST  1 VIEW COMPARISON:  Radiographs of November 06, 2010. FINDINGS: Stable cardiomegaly. Right-sided pacemaker is unchanged in position. Large hiatal hernia is noted. No pneumothorax or pleural effusion is noted. Both lungs are clear. The visualized skeletal structures are unremarkable. IMPRESSION: No acute cardiopulmonary abnormality seen. Large hiatal hernia is noted. Electronically Signed   By: Lupita Raider, M.D.   On: 10/20/2018 11:46   Dg Lumbar Spine Complete  Result Date: 10/20/2018 CLINICAL DATA:  Low back pain after fall from wheelchair. EXAM: LUMBAR SPINE - COMPLETE 4+ VIEW COMPARISON:  None. FINDINGS: Moderate levoscoliosis of lumbar spine is noted. Diffuse osteopenia is noted. Probable old L5 compression fracture is noted. No acute fracture or spondylolisthesis is noted. Severe degenerative disc disease is noted at L1-2, L2-3 and L3-4. IMPRESSION: Probable old L5 compression fracture. Severe multilevel degenerative disc disease is noted. No definite acute abnormality seen in the lumbar spine. Electronically Signed   By: Lupita Raider,  M.D.   On: 10/20/2018 11:48   Dg Hip Unilat With Pelvis 2-3 Views Left  Result Date: 10/20/2018 CLINICAL DATA:  Left hip pain after fall out of wheelchair. EXAM: DG HIP (WITH OR WITHOUT PELVIS) 2-3V LEFT COMPARISON:  None. FINDINGS: Moderately displaced fracture is seen involving the proximal left femoral neck. Right hip appears normal. IMPRESSION: Moderately displaced proximal left femoral neck fracture. Electronically Signed   By: Lupita Raider, M.D.   On: 10/20/2018 11:50    Assessment and Plan:   1. Paroxysmal atrial fibrillation-patient has developed recurrent atrial fibrillation since admission.  His systolic blood pressure is in the 90s and I am therefore hesitant to add beta-blockade or calcium blocker.  He received his last dose of sotalol this evening.  Best option likely will be to hold sotalol and once it has cleared his system begin IV amiodarone both to assist with rate control and rhythm control.  I will give 0.25 mg of IV digoxin x2 this evening to assist with rate control. CHADSvasc 2.  However per his wife he falls every 2 months.  I therefore think risk of anticoagulation likely outweighs benefit.  Will need to review with Dr. [name].  We will plan to repeat echocardiogram. 2. Preoperative evaluation prior to hip repair-patient denies chest pain or dyspnea but has limited mobility because of back pain.  His risk will be mildly increased because of his age.  However I do not think he would be a good candidate for aggressive cardiac evaluation preoperatively and therefore would proceed once atrial fibrillation rate is controlled.  Patient's wife understands there is some increased risk with surgery. 3. Prior pacemaker 4. Left femoral neck fracture-management per orthopedics.  For questions or updates, please contact [facility] Please consult [email or web address] for contact info under     Signed, Olga Millers, MD  10/21/2018 6:51 PM

## 2018-10-21 NOTE — Progress Notes (Signed)
CBC results called to [place]. No new orders received at this time.

## 2018-10-21 NOTE — Progress Notes (Signed)
PROGRESS NOTE    Donald MouseRalph C Potts  [name]:[id number] [name]: 10/10/1928 DOA: 10/20/2018 PCP: Gaspar Garbeisovec, Richard W, MD  Brief Narrative:  HPI per Dr. Jonah BlueJennifer Yates on 10/20/2018 [name] is a 85 y.o. male with medical history significant of pacemaker placement; and afib presenting with a fall.   He got out of bed and was moving into his electric scooter.  He lost his balance while transferring and fell on his side.   He uses a walker for transfers but uses his scooter most of the time.  This is due to scoliosis and decreased leg strength.  He also had a bad knee on his right leg and now injured his left leg.  No dizziness, mechanical fall only.  **Surgical intervention was canceled this morning due to his atrial fibrillation with RVR.  He was given Betapace along with metoprolol IV and still has elevated heart rate so Cardiology was consulted for further evaluation recommendations.  Assessment & Plan:   Principal Problem:   Left displaced femoral neck fracture (HCC) Active Problems:   ATRIAL FIBRILLATION   Essential hypertension, benign   Chronic pain syndrome   Memory loss  Left Displaced Femoral Neck Fracture -Mechanical fall resulting in hip fracture -Orthopedics consulted and planning on Repair tomorrow as HR was too elevated today  -NPO after midnight in anticipation of surgical repair tomorrow -SCDs overnight, Resume Coumadin post-operatively per Ortho -Pain control with Robxain, Vicodin, and Morphine prn as below -SW consult for rehab placement - prefers [facility] -Will need PT consult post-operatively -Hip fracture order set utilized  Paroxysmal A Fib, on Coumadin Atrial Tachycardia Tachycardia-Bradycardia Syndrom s/p PPM -Usually Rate controlled with pacemaker but rates Uncontrolled and running high -Has prn Sotalol for breakthrough tachycardia and takes 160 mg po BIDprn for HR>160 -Has been given 2.5 mg of Lopressor x2 without success -[facility] and [facility]  given vitamin K to reverse his coagulopathy prior to surgery -Repeat INR -Cardiology consulted for further evaluation and recommendations given his Tachycardia and mild Hypotension   HTN -Takes prn Sotalol, otherwise not on BP medications routinely -BP is on the lower side and is 90/56 -Hold Antihypertensives and have asked Cardiology to Weigh in  Chronic pain DJD/DDD -Continue Hydrocodone-Acetaminophen 10-325 mg q4hprn with addition of Morphine 0.5 mg q2hprn for breakthrough/severe pain  Memory Loss -Patient appeared to have some difficulty answering all questions and continues to do so -Wife confirms some question of early dementia -Outpatient workup   GERD -Continue to Monitor   Leukocytosis -In the setting of Fall and Fracture -WBC went from 11.4 -> 11.8 -Continue to Monitor for S/Sx of Infection; Received Preoperative Abx already -Repeat CBC in AM  Thrombocytopenia -In the setting of Fall; Platelet Count went from 179 -> 138 -Takes Coumadin at Home -Continue to Monitor for S/Sx of Bleeding -Repeat CBC in AM   Hyperglycemia -Patient's Glucose level on CMP was 105 -Likely Reactive but has no HbA1c -Continue to Monitor Blood Sugars carefully and if remaining elevated will place on Sensitive Novolog SSI  DVT prophylaxis: SCDs for now and will hold his Home Coumadin Code Status: FULL CODE Family Communication: No family present at bedside  Disposition Plan: Pending Surgical Intervention by Orthopedic Surgery   Consultants:   Orthopedic Surgery Dr. Carola FrostHandy  Cardiology    Procedures:  None   Antimicrobials:  Anti-infectives (From admission, onward)   Start     Dose/Rate Route Frequency Ordered Stop   10/21/18 1300  ceFAZolin (ANCEF) IVPB 2g/100  mL premix  Status:  Discontinued     2 g 200 mL/hr over 30 Minutes Intravenous To [facility] 10/20/18 1932 10/21/18 0938   10/21/18 1015  ceFAZolin (ANCEF) IVPB 2g/100 mL premix     2 g 200 mL/hr over 30  Minutes Intravenous On call to O.R. 10/21/18 1006 10/22/18 0559   10/21/18 0800  ceFAZolin (ANCEF) IVPB 2g/100 mL premix  Status:  Discontinued     2 g 200 mL/hr over 30 Minutes [facility] 10/20/18 1924 10/20/18 1932     Subjective: Seen and examined at bedside and was little confused and had difficulty answering some question.  No nausea or vomiting.  Do not feel his heart racing.  No lightheadedness or dizziness.  No other concerns or complaints at this time  Objective: Vitals:   10/21/18 0915 10/21/18 0918 10/21/18 0953 10/21/18 1346  BP:   92/64 (!) 90/56  Pulse:   99 (!) 122  Resp: 15 (!) 34    Temp:   97.8 F (36.6 C) 98.7 F (37.1 C)  TempSrc:   Oral Oral  SpO2:   97% 95%  Weight:      Height:        Intake/Output Summary (Last 24 hours) at 10/21/2018 1500 Last data filed at 10/21/2018 1100 Gross per 24 hour  Intake 360 ml  Output 850 ml  Net -490 ml   Filed Weights   10/20/18 2000 10/21/18 0740  Weight: 65.2 kg 65.2 kg   Examination: Physical Exam:  Constitutional: Thin elderly Caucasian Male in NAD and appears calm and slightly uncomfortable Eyes: Lids and conjunctivae normal, sclerae anicteric  ENMT: External Ears, Nose appear normal. Grossly normal hearing.  Neck: Appears normal, supple, no cervical masses, normal ROM, no appreciable thyromegaly; no JVD Respiratory: Diminished to auscultation bilaterally, no wheezing, rales, rhonchi or crackles. Normal respiratory effort and patient is not tachypenic. No accessory muscle use.  Cardiovascular: Tachycardic and irregularly irregular, no murmurs / rubs / gallops. S1 and S2 auscultated. No extremity edema noted but has chronic LE venous stasis changes Abdomen: Soft, non-tender, non-distended. No masses palpated. No appreciable hepatosplenomegaly. Bowel sounds positive x4.  GU: Deferred. Musculoskeletal: Right Knee with a deformity/effusion and Left Hip has an acute bony protuberance and  shortening of the leg with external rotation Skin: No rashes, lesions, ulcers on a limited skin evaluation but has chronic LE venous stasis changes. No induration; Warm and dry.  Neurologic: CN 2-12 grossly intact with no focal deficits.  Romberg sign and cerebellar reflexes not assessed.  Psychiatric: Normal judgment and insight. Alert and oriented x 1. Normal mood and appropriate affect.   Data Reviewed: I have personally reviewed following labs and imaging studies  CBC: Recent Labs  Lab 10/20/18 1039 10/21/18 0857  WBC 11.4* 11.8*  NEUTROABS 9.3* 9.8*  HGB 13.4 13.4  HCT 42.3 39.9  MCV 101.9* 100.8*  PLT 179 138*   Basic Metabolic Panel: Recent Labs  Lab 10/20/18 1318  NA 136  K 4.0  CL 96*  CO2 29  GLUCOSE 105*  BUN 16  CREATININE 0.72  CALCIUM 9.1   GFR: Estimated Creatinine Clearance: 58.7 mL/min (by C-G formula based on SCr of 0.72 mg/dL). Liver Function Tests: No results for input(s): AST, ALT, ALKPHOS, BILITOT, PROT, ALBUMIN in the last 168 hours. No results for input(s): LIPASE, AMYLASE in the last 168 hours. No results for input(s): AMMONIA in the last 168 hours. Coagulation Profile: Recent Labs  Lab 10/20/18 1105 10/21/18  0248  INR 2.21 1.50   Cardiac Enzymes: No results for input(s): CKTOTAL, CKMB, CKMBINDEX, TROPONINI in the last 168 hours. BNP (last 3 results) No results for input(s): PROBNP in the last 8760 hours. HbA1C: No results for input(s): HGBA1C in the last 72 hours. CBG: No results for input(s): GLUCAP in the last 168 hours. Lipid Profile: No results for input(s): CHOL, HDL, LDLCALC, TRIG, CHOLHDL, LDLDIRECT in the last 72 hours. Thyroid Function Tests: No results for input(s): TSH, T4TOTAL, FREET4, T3FREE, THYROIDAB in the last 72 hours. Anemia Panel: No results for input(s): VITAMINB12, FOLATE, FERRITIN, TIBC, IRON, RETICCTPCT in the last 72 hours. Sepsis Labs: No results for input(s): PROCALCITON, LATICACIDVEN in the last 168  hours.  Recent Results (from the past 240 hour(s))  MRSA PCR Screening     Status: Abnormal   Collection Time: 10/20/18  6:54 PM  Result Value Ref Range Status   MRSA by PCR POSITIVE (A) NEGATIVE Final    Comment:        The GeneXpert MRSA Assay (FDA approved for NASAL specimens only), is one component of a comprehensive MRSA colonization surveillance program. It is not intended to diagnose MRSA infection nor to guide or monitor treatment for MRSA infections. RESULT CALLED TO, READ BACK BY AND VERIFIED WITH: Darcella Cheshire RN 2209 10/20/18 A BROWNING Performed at [facility], [address] [place], [place] [address]      RN Pressure Injury Documentation:     Estimated body mass index is 23.2 kg/m as calculated from the following:   Height as of this encounter: 5\' 6"  (1.676 m).   Weight as of this encounter: 65.2 kg.  Malnutrition Type:      Malnutrition Characteristics:      Nutrition Interventions:           Radiology Studies: Dg Chest 1 View  Result Date: 10/20/2018 CLINICAL DATA:  Fall from wheelchair today. EXAM: CHEST  1 VIEW COMPARISON:  Radiographs of November 06, 2010. FINDINGS: Stable cardiomegaly. Right-sided pacemaker is unchanged in position. Large hiatal hernia is noted. No pneumothorax or pleural effusion is noted. Both lungs are clear. The visualized skeletal structures are unremarkable. IMPRESSION: No acute cardiopulmonary abnormality seen. Large hiatal hernia is noted. Electronically Signed   By: Lupita Raider, M.D.   On: 10/20/2018 11:46   Dg Lumbar Spine Complete  Result Date: 10/20/2018 CLINICAL DATA:  Low back pain after fall from wheelchair. EXAM: LUMBAR SPINE - COMPLETE 4+ VIEW COMPARISON:  None. FINDINGS: Moderate levoscoliosis of lumbar spine is noted. Diffuse osteopenia is noted. Probable old L5 compression fracture is noted. No acute fracture or spondylolisthesis is noted. Severe degenerative disc disease is noted at L1-2, L2-3  and L3-4. IMPRESSION: Probable old L5 compression fracture. Severe multilevel degenerative disc disease is noted. No definite acute abnormality seen in the lumbar spine. Electronically Signed   By: Lupita Raider, M.D.   On: 10/20/2018 11:48   Dg Hip Unilat With Pelvis 2-3 Views Left  Result Date: 10/20/2018 CLINICAL DATA:  Left hip pain after fall out of wheelchair. EXAM: DG HIP (WITH OR WITHOUT PELVIS) 2-3V LEFT COMPARISON:  None. FINDINGS: Moderately displaced fracture is seen involving the proximal left femoral neck. Right hip appears normal. IMPRESSION: Moderately displaced proximal left femoral neck fracture. Electronically Signed   By: Lupita Raider, M.D.   On: 10/20/2018 11:50    Scheduled Meds: . docusate sodium  100 mg Oral BID  . mupirocin ointment  1 application  Nasal BID   Continuous Infusions: .  ceFAZolin (ANCEF) IV    . lactated ringers 10 mL/hr at 10/21/18 0754  . methocarbamol (ROBAXIN) IV      LOS: 1 day   Merlene Laughter, DO [facility] PAGER is on AMION  If 7PM-7AM, please contact night-coverage [email or web address] Password [email or web address] 10/21/2018, 3:00 PM

## 2018-10-21 NOTE — Progress Notes (Signed)
Initial Nutrition Assessment  DOCUMENTATION CODES:   Severe malnutrition in context of chronic illness  INTERVENTION:    Ensure Enlive po TID, each supplement provides 350 kcal and 20 grams of protein  Multivitamin daily  NUTRITION DIAGNOSIS:   Severe Malnutrition related to chronic illness(CAD) as evidenced by severe fat depletion, severe muscle depletion.  GOAL:   Patient will meet greater than or equal to 90% of their needs  MONITOR:   PO intake, Supplement acceptance, Labs, Skin  REASON FOR ASSESSMENT:   Consult Hip fracture protocol  ASSESSMENT:   85 yo male with PMH of PAF, tachy-brady syndrome, pacemaker, spinal stenosis, DJD, DDD, scoliosis who was admitted with L femoral neck fracture s/p mechanical fall.   Patient lying in bed during RD visit. Family and friends at bedside. Family reports that patient eats very well. 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 Over the past few years, he has lost weight; unsure of the time frame or exact amount of weight loss.   Patient consumed 100% of lunch meal today.  Labs and medications reviewed. Plans for surgery tomorrow morning.   NUTRITION - FOCUSED PHYSICAL EXAM:    Most Recent Value  Orbital Region  Severe depletion  Upper Arm Region  Severe depletion  Thoracic and Lumbar Region  Severe depletion  Buccal Region  Moderate depletion  Temple Region  Severe depletion  Clavicle Bone Region  Severe depletion  Clavicle and Acromion Bone Region  Severe depletion  Scapular Bone Region  Severe depletion  Dorsal Hand  Severe depletion  Patellar Region  Moderate depletion  Anterior Thigh Region  Moderate depletion  Posterior Calf Region  Moderate depletion  Edema (RD Assessment)  None  Hair  Reviewed  Eyes  Reviewed  Mouth  Reviewed  Skin  Reviewed  Nails  Reviewed       Diet Order:   Diet Order            Diet NPO time specified Except for: Sips with Meds  Diet effective midnight        DIET  SOFT Room service appropriate? Yes; Fluid consistency: Thin  Diet effective now              EDUCATION NEEDS:   No education needs have been identified at this time  Skin:  Skin Assessment: Reviewed RN Assessment  Last BM:  2/11  Height:   Ht Readings from Last 1 Encounters:  10/21/18 5\' 6"  (1.676 m)    Weight:   Wt Readings from Last 1 Encounters:  10/21/18 65.2 kg    Ideal Body Weight:  64.5 kg  BMI:  Body mass index is 23.2 kg/m.  Estimated Nutritional Needs:   Kcal:  1700-1900  Protein:  85-100 gm  Fluid:  1.8 L    Joaquin Courts, RD, LDN, CNSC Pager [phone] After Hours Pager [phone]

## 2018-10-21 NOTE — Progress Notes (Signed)
BP put in order for saline bolus. Bolus started, PT wife stated he takes Sotalol 2x a day and wanted him to have it again.

## 2018-10-21 NOTE — Plan of Care (Signed)
  Problem: Education: Goal: Knowledge of General Education information will improve Description Including pain rating scale, medication(s)/side effects and non-pharmacologic comfort measures Outcome: Progressing   Problem: Clinical Measurements: Goal: Will remain free from infection Outcome: Progressing Goal: Cardiovascular complication will be avoided Outcome: Progressing   Problem: Pain Management: Goal: Pain level will decrease Outcome: Progressing

## 2018-10-22 ENCOUNTER — Inpatient Hospital Stay (HOSPITAL_COMMUNITY): Payer: Medicare Other | Admitting: Anesthesiology

## 2018-10-22 ENCOUNTER — Inpatient Hospital Stay (HOSPITAL_COMMUNITY): Payer: Medicare Other

## 2018-10-22 ENCOUNTER — Encounter (HOSPITAL_COMMUNITY)
Admission: EM | Disposition: A | Discharge status: Skilled Nursing Facility | Payer: Self-pay | Source: Home / Self Care | Attending: Internal Medicine

## 2018-10-22 ENCOUNTER — Encounter (HOSPITAL_COMMUNITY): Payer: Self-pay | Admitting: *Deleted

## 2018-10-22 DIAGNOSIS — E43 Unspecified severe protein-calorie malnutrition: Secondary | ICD-10-CM

## 2018-10-22 DIAGNOSIS — S72002A Fracture of unspecified part of neck of left femur, initial encounter for closed fracture: Principal | ICD-10-CM

## 2018-10-22 DIAGNOSIS — E876 Hypokalemia: Secondary | ICD-10-CM

## 2018-10-22 HISTORY — PX: HIP ARTHROPLASTY: SHX981

## 2018-10-22 LAB — COMPREHENSIVE METABOLIC PANEL
ALBUMIN: 2.6 g/dL — AB (ref 3.5–5.0)
ALT: 15 U/L (ref 0–44)
AST: 26 U/L (ref 15–41)
Alkaline Phosphatase: 42 U/L (ref 38–126)
Anion gap: 9 (ref 5–15)
BUN: 20 mg/dL (ref 8–23)
CO2: 27 mmol/L (ref 22–32)
Calcium: 8.5 mg/dL — ABNORMAL LOW (ref 8.9–10.3)
Chloride: 101 mmol/L (ref 98–111)
Creatinine, Ser: 0.74 mg/dL (ref 0.61–1.24)
GFR calc Af Amer: >60 mL/min (ref >60)
GFR calc non Af Amer: >60 mL/min (ref >60)
Glucose, Bld: 99 mg/dL (ref 70–99)
Potassium: 3.3 mmol/L — ABNORMAL LOW (ref 3.5–5.1)
Sodium: 137 mmol/L (ref 135–145)
Total Bilirubin: 1.6 mg/dL — ABNORMAL HIGH (ref 0.3–1.2)
Total Protein: 5.3 g/dL — ABNORMAL LOW (ref 6.5–8.1)

## 2018-10-22 LAB — CBC
HCT: 37.9 % — ABNORMAL LOW (ref 39.0–52.0)
Hemoglobin: 12.6 g/dL — ABNORMAL LOW (ref 13.0–17.0)
MCH: 32.7 pg (ref 26.0–34.0)
MCHC: 33.2 g/dL (ref 30.0–36.0)
MCV: 98.4 fL (ref 80.0–100.0)
Platelets: 121 10*3/uL — ABNORMAL LOW (ref 150–400)
RBC: 3.85 MIL/uL — ABNORMAL LOW (ref 4.22–5.81)
RDW: 13.5 % (ref 11.5–15.5)
WBC: 11 10*3/uL — ABNORMAL HIGH (ref 4.0–10.5)
nRBC: 0 % (ref 0.0–0.2)

## 2018-10-22 LAB — CBC WITH DIFFERENTIAL/PLATELET
ABS IMMATURE GRANULOCYTES: 0.04 10*3/uL (ref 0.00–0.07)
BASOS PCT: 0 %
Basophils Absolute: 0 10*3/uL (ref 0.0–0.1)
Eosinophils Absolute: 0.1 10*3/uL (ref 0.0–0.5)
Eosinophils Relative: 1 %
HCT: 38.1 % — ABNORMAL LOW (ref 39.0–52.0)
Hemoglobin: 12.6 g/dL — ABNORMAL LOW (ref 13.0–17.0)
Immature Granulocytes: 0 %
Lymphocytes Relative: 8 %
Lymphs Abs: 0.9 10*3/uL (ref 0.7–4.0)
MCH: 33.1 pg (ref 26.0–34.0)
MCHC: 33.1 g/dL (ref 30.0–36.0)
MCV: 100 fL (ref 80.0–100.0)
Monocytes Absolute: 1 10*3/uL (ref 0.1–1.0)
Monocytes Relative: 10 %
NRBC: 0 % (ref 0.0–0.2)
Neutro Abs: 8.6 10*3/uL — ABNORMAL HIGH (ref 1.7–7.7)
Neutrophils Relative %: 81 %
Platelets: 124 10*3/uL — ABNORMAL LOW (ref 150–400)
RBC: 3.81 MIL/uL — ABNORMAL LOW (ref 4.22–5.81)
RDW: 13.7 % (ref 11.5–15.5)
WBC: 10.7 10*3/uL — ABNORMAL HIGH (ref 4.0–10.5)

## 2018-10-22 LAB — PHOSPHORUS: Phosphorus: 2.2 mg/dL — ABNORMAL LOW (ref 2.5–4.6)

## 2018-10-22 LAB — MAGNESIUM: Magnesium: 1.7 mg/dL (ref 1.7–2.4)

## 2018-10-22 LAB — PROTIME-INR
INR: 1.32
Prothrombin Time: 16.3 seconds — ABNORMAL HIGH (ref 11.4–15.2)

## 2018-10-22 SURGERY — HEMIARTHROPLASTY, HIP, DIRECT ANTERIOR APPROACH, FOR FRACTURE
Anesthesia: General | Laterality: Left

## 2018-10-22 MED ORDER — VANCOMYCIN HCL IN DEXTROSE 1-5 GM/200ML-% IV SOLN
INTRAVENOUS | Status: AC
  Administered 2018-10-22: 1000 mg via INTRAVENOUS
  Filled 2018-10-22: qty 200

## 2018-10-22 MED ORDER — ENOXAPARIN SODIUM 40 MG/0.4ML ~~LOC~~ SOLN
40.0000 mg | SUBCUTANEOUS | Status: DC
  Administered 2018-10-23 – 2018-10-24 (×2): 40 mg via SUBCUTANEOUS
  Filled 2018-10-22 (×2): qty 0.4

## 2018-10-22 MED ORDER — ROCURONIUM BROMIDE 50 MG/5ML IV SOSY
PREFILLED_SYRINGE | INTRAVENOUS | Status: AC
  Filled 2018-10-22: qty 5

## 2018-10-22 MED ORDER — METOCLOPRAMIDE HCL 5 MG/ML IJ SOLN: 5.0000 mg | Freq: Three times a day (TID) | INTRAMUSCULAR | Status: DC | PRN

## 2018-10-22 MED ORDER — PHENYLEPHRINE 40 MCG/ML (10ML) SYRINGE FOR IV PUSH (FOR BLOOD PRESSURE SUPPORT)
PREFILLED_SYRINGE | INTRAVENOUS | Status: DC | PRN
  Administered 2018-10-22 (×2): 160 ug via INTRAVENOUS
  Administered 2018-10-22: 120 ug via INTRAVENOUS
  Administered 2018-10-22: 80 ug via INTRAVENOUS

## 2018-10-22 MED ORDER — LACTATED RINGERS IV SOLN
INTRAVENOUS | Status: DC | PRN
  Administered 2018-10-22: 08:00:00 via INTRAVENOUS

## 2018-10-22 MED ORDER — SODIUM CHLORIDE 0.9 % IR SOLN
Status: DC | PRN
  Administered 2018-10-22: 3000 mL

## 2018-10-22 MED ORDER — METOCLOPRAMIDE HCL 5 MG PO TABS: 5.0000 mg | ORAL_TABLET | Freq: Three times a day (TID) | ORAL | Status: DC | PRN

## 2018-10-22 MED ORDER — PROPOFOL 10 MG/ML IV BOLUS
INTRAVENOUS | Status: DC | PRN
  Administered 2018-10-22: 60 mg via INTRAVENOUS

## 2018-10-22 MED ORDER — ROCURONIUM BROMIDE 10 MG/ML (PF) SYRINGE
PREFILLED_SYRINGE | INTRAVENOUS | Status: DC | PRN
  Administered 2018-10-22: 40 mg via INTRAVENOUS
  Administered 2018-10-22: 20 mg via INTRAVENOUS

## 2018-10-22 MED ORDER — FENTANYL CITRATE (PF) 250 MCG/5ML IJ SOLN
INTRAMUSCULAR | Status: DC | PRN
  Administered 2018-10-22 (×2): 50 ug via INTRAVENOUS

## 2018-10-22 MED ORDER — PHENOL 1.4 % MT LIQD: 1.0000 | OROMUCOSAL | Status: DC | PRN

## 2018-10-22 MED ORDER — POTASSIUM PHOSPHATES 15 MMOLE/5ML IV SOLN
10.0000 mmol | Freq: Once | INTRAVENOUS | Status: AC
  Administered 2018-10-22: 10 mmol via INTRAVENOUS
  Filled 2018-10-22: qty 3.33

## 2018-10-22 MED ORDER — METOPROLOL TARTRATE 5 MG/5ML IV SOLN
5.0000 mg | INTRAVENOUS | Status: AC | PRN
  Administered 2018-10-22 – 2018-10-23 (×2): 5 mg via INTRAVENOUS
  Filled 2018-10-22 (×3): qty 5

## 2018-10-22 MED ORDER — SODIUM CHLORIDE 0.9 % IV SOLN
INTRAVENOUS | Status: DC | PRN
  Administered 2018-10-22: 25 ug/min via INTRAVENOUS

## 2018-10-22 MED ORDER — PROPOFOL 10 MG/ML IV BOLUS
INTRAVENOUS | Status: AC
  Filled 2018-10-22: qty 20

## 2018-10-22 MED ORDER — METOPROLOL TARTRATE 12.5 MG HALF TABLET
12.5000 mg | ORAL_TABLET | Freq: Two times a day (BID) | ORAL | Status: DC
  Administered 2018-10-22 – 2018-10-26 (×9): 12.5 mg via ORAL
  Filled 2018-10-22 (×9): qty 1

## 2018-10-22 MED ORDER — MENTHOL 3 MG MT LOZG: 1.0000 | LOZENGE | OROMUCOSAL | Status: DC | PRN

## 2018-10-22 MED ORDER — ONDANSETRON HCL 4 MG/2ML IJ SOLN
INTRAMUSCULAR | Status: AC
  Filled 2018-10-22: qty 2

## 2018-10-22 MED ORDER — VANCOMYCIN HCL IN DEXTROSE 1-5 GM/200ML-% IV SOLN
1000.0000 mg | Freq: Once | INTRAVENOUS | Status: AC
  Administered 2018-10-22: 1000 mg via INTRAVENOUS

## 2018-10-22 MED ORDER — DOCUSATE SODIUM 100 MG PO CAPS: 100.0000 mg | ORAL_CAPSULE | Freq: Two times a day (BID) | ORAL | Status: DC

## 2018-10-22 MED ORDER — SUGAMMADEX SODIUM 200 MG/2ML IV SOLN
INTRAVENOUS | Status: DC | PRN
  Administered 2018-10-22: 180 mg via INTRAVENOUS

## 2018-10-22 MED ORDER — FENTANYL CITRATE (PF) 250 MCG/5ML IJ SOLN
INTRAMUSCULAR | Status: AC
  Filled 2018-10-22: qty 5

## 2018-10-22 MED ORDER — VANCOMYCIN HCL IN DEXTROSE 1-5 GM/200ML-% IV SOLN
1000.0000 mg | Freq: Two times a day (BID) | INTRAVENOUS | Status: AC
  Administered 2018-10-22: 1000 mg via INTRAVENOUS
  Filled 2018-10-22: qty 200

## 2018-10-22 MED ORDER — LACTATED RINGERS IV SOLN
INTRAVENOUS | Status: DC
  Administered 2018-10-22: 08:00:00 via INTRAVENOUS

## 2018-10-22 MED ORDER — LIDOCAINE 2% (20 MG/ML) 5 ML SYRINGE
INTRAMUSCULAR | Status: DC | PRN
  Administered 2018-10-22: 40 mg via INTRAVENOUS

## 2018-10-22 MED ORDER — LIDOCAINE 2% (20 MG/ML) 5 ML SYRINGE
INTRAMUSCULAR | Status: AC
  Filled 2018-10-22: qty 5

## 2018-10-22 MED ORDER — 0.9 % SODIUM CHLORIDE (POUR BTL) OPTIME
TOPICAL | Status: DC | PRN
  Administered 2018-10-22: 1000 mL

## 2018-10-22 MED ORDER — ONDANSETRON HCL 4 MG/2ML IJ SOLN
INTRAMUSCULAR | Status: DC | PRN
  Administered 2018-10-22: 4 mg via INTRAVENOUS

## 2018-10-22 MED ORDER — POTASSIUM CHLORIDE 10 MEQ/100ML IV SOLN
10.0000 meq | INTRAVENOUS | Status: AC
  Administered 2018-10-22 (×4): 10 meq via INTRAVENOUS
  Filled 2018-10-22 (×4): qty 100

## 2018-10-22 MED ORDER — SODIUM CHLORIDE 0.9 % IV SOLN
INTRAVENOUS | Status: DC
  Administered 2018-10-23 – 2018-10-25 (×3): via INTRAVENOUS

## 2018-10-22 SURGICAL SUPPLY — 59 items
BLADE SAW SAG 73X25 THK (BLADE) ×1
BLADE SAW SGTL 73X25 THK (BLADE) ×2 IMPLANT
BRUSH FEMORAL CANAL (MISCELLANEOUS) IMPLANT
BRUSH SCRUB SURG 4.25 DISP (MISCELLANEOUS) ×6 IMPLANT
COVER SURGICAL LIGHT HANDLE (MISCELLANEOUS) ×3 IMPLANT
COVER WAND RF STERILE (DRAPES) ×3 IMPLANT
DRAPE INCISE IOBAN 85X60 (DRAPES) ×3 IMPLANT
DRAPE ORTHO SPLIT 77X108 STRL (DRAPES) ×4
DRAPE SURG ORHT 6 SPLT 77X108 (DRAPES) ×2 IMPLANT
DRAPE U-SHAPE 47X51 STRL (DRAPES) ×3 IMPLANT
DRILL BIT 7/64X5 (BIT) ×3 IMPLANT
DRSG MEPILEX BORDER 4X8 (GAUZE/BANDAGES/DRESSINGS) ×3 IMPLANT
ELECT BLADE 6.5 EXT (BLADE) IMPLANT
ELECT CAUTERY BLADE 6.4 (BLADE) IMPLANT
ELECT REM PT RETURN 9FT ADLT (ELECTROSURGICAL) ×3
ELECTRODE REM PT RTRN 9FT ADLT (ELECTROSURGICAL) ×1 IMPLANT
GLOVE BIO SURGEON STRL SZ 6.5 (GLOVE) ×4 IMPLANT
GLOVE BIO SURGEON STRL SZ7.5 (GLOVE) ×3 IMPLANT
GLOVE BIO SURGEON STRL SZ8 (GLOVE) ×3 IMPLANT
GLOVE BIO SURGEONS STRL SZ 6.5 (GLOVE) ×2
GLOVE BIOGEL PI IND STRL 6.5 (GLOVE) ×2 IMPLANT
GLOVE BIOGEL PI IND STRL 8 (GLOVE) ×2 IMPLANT
GLOVE BIOGEL PI INDICATOR 6.5 (GLOVE) ×4
GLOVE BIOGEL PI INDICATOR 8 (GLOVE) ×4
GOWN STRL REUS W/ TWL LRG LVL3 (GOWN DISPOSABLE) ×2 IMPLANT
GOWN STRL REUS W/ TWL XL LVL3 (GOWN DISPOSABLE) ×1 IMPLANT
GOWN STRL REUS W/TWL 2XL LVL3 (GOWN DISPOSABLE) ×3 IMPLANT
GOWN STRL REUS W/TWL LRG LVL3 (GOWN DISPOSABLE) ×4
GOWN STRL REUS W/TWL XL LVL3 (GOWN DISPOSABLE) ×2
HANDPIECE INTERPULSE COAX TIP (DISPOSABLE)
HEAD FEM UNIPOLAR 54 OD STRL (Hips) ×3 IMPLANT
IMMOBILIZER KNEE 22 UNIV (SOFTGOODS) ×3 IMPLANT
KIT BASIN OR (CUSTOM PROCEDURE TRAY) ×3 IMPLANT
KIT TURNOVER KIT B (KITS) ×3 IMPLANT
MANIFOLD NEPTUNE II (INSTRUMENTS) ×3 IMPLANT
NEEDLE 1/2 CIR MAYO (NEEDLE) IMPLANT
NS IRRIG 1000ML POUR BTL (IV SOLUTION) ×3 IMPLANT
PACK TOTAL JOINT (CUSTOM PROCEDURE TRAY) ×3 IMPLANT
PAD ARMBOARD 7.5X6 YLW CONV (MISCELLANEOUS) ×6 IMPLANT
PILLOW ABDUCTION HIP (SOFTGOODS) IMPLANT
PRESSURIZER FEMORAL UNIV (MISCELLANEOUS) IMPLANT
RETRIEVER SUT HEWSON (MISCELLANEOUS) ×3 IMPLANT
SET HNDPC FAN SPRY TIP SCT (DISPOSABLE) IMPLANT
SPACER FEM TAPERED +0 12/14 (Hips) ×3 IMPLANT
STAPLER VISISTAT 35W (STAPLE) ×3 IMPLANT
STEM SUMMIT PRESSFIT SZ 6 (Hips) ×3 IMPLANT
SUT ETHILON 2 0 PSLX (SUTURE) ×6 IMPLANT
SUT FIBERWIRE #2 38 T-5 BLUE (SUTURE) ×6
SUT VIC AB 1 CT1 18XCR BRD 8 (SUTURE) ×1 IMPLANT
SUT VIC AB 1 CT1 27 (SUTURE) ×2
SUT VIC AB 1 CT1 27XBRD ANBCTR (SUTURE) ×1 IMPLANT
SUT VIC AB 1 CT1 8-18 (SUTURE) ×2
SUT VIC AB 2-0 CT1 27 (SUTURE) ×4
SUT VIC AB 2-0 CT1 TAPERPNT 27 (SUTURE) ×2 IMPLANT
SUTURE FIBERWR #2 38 T-5 BLUE (SUTURE) ×2 IMPLANT
TOWEL OR 17X24 6PK STRL BLUE (TOWEL DISPOSABLE) ×3 IMPLANT
TOWEL OR 17X26 10 PK STRL BLUE (TOWEL DISPOSABLE) ×3 IMPLANT
TOWER CARTRIDGE SMART MIX (DISPOSABLE) IMPLANT
WATER STERILE IRR 1000ML POUR (IV SOLUTION) ×3 IMPLANT

## 2018-10-22 NOTE — Progress Notes (Signed)
Progress Note  Patient Name: [name] Date of Encounter: 10/22/2018  Primary Cardiologist: No primary care provider on file. Dr. [name] for EP  Subjective   Pt is sleepy after surgery. No apparent complaints. His son is in the room and we mostly spoke with him.   Inpatient Medications    Scheduled Meds: . docusate sodium  100 mg Oral BID  . [START ON 10/23/2018] enoxaparin (LOVENOX) injection  40 mg Subcutaneous Q24H  . feeding supplement (ENSURE ENLIVE)  237 mL Oral TID BM  . multivitamin with minerals  1 tablet Oral Daily  . mupirocin ointment  1 application Nasal BID   Continuous Infusions: . lactated ringers Stopped (10/22/18 1017)  . lactated ringers 10 mL/hr at 10/22/18 0747  . methocarbamol (ROBAXIN) IV    . potassium chloride    . potassium PHOSPHATE IVPB (in mmol)    . vancomycin     PRN Meds: HYDROcodone-acetaminophen, menthol-cetylpyridinium **OR** phenol, methocarbamol **OR** methocarbamol (ROBAXIN) IV, metoCLOPramide **OR** metoCLOPramide (REGLAN) injection, morphine injection, ondansetron (ZOFRAN) IV   Vital Signs    Vitals:   10/22/18 1045 10/22/18 1100 10/22/18 1115 10/22/18 1213  BP: (!) 144/89 132/80 116/80 128/75  Pulse: 71 81  96  Resp: (!) 21 16 20 16   Temp:   97.9 F (36.6 C) 98.1 F (36.7 C)  TempSrc:    Oral  SpO2: 98% 96% 92% 96%  Weight:      Height:        Intake/Output Summary (Last 24 hours) at 10/22/2018 1433 Last data filed at 10/22/2018 1029 Gross per 24 hour  Intake 1300 ml  Output 100 ml  Net 1200 ml   Last 3 Weights 10/22/2018 10/21/2018 10/20/2018  Weight (lbs) 143 lb 11.8 oz 143 lb 11.8 oz 143 lb 11.8 oz  Weight (kg) 65.2 kg 65.2 kg 65.2 kg      Telemetry    afib 100-110 bpm with occ PVCs - Personally Reviewed  ECG    No new tracings - Personally Reviewed  Physical Exam   GEN: Elderly frail, No acute distress.   Neck: No JVD Cardiac: irregularly irregular rhythm, no murmurs, rubs, or gallops.    Respiratory: Clear to auscultation bilaterally. GI: Soft, nontender, non-distended  MS: No edema Neuro:  Nonfocal  Psych: Normal affect   Labs    Chemistry Recent Labs  Lab 10/20/18 1318 10/22/18 0407  NA 136 137  K 4.0 3.3*  CL 96* 101  CO2 29 27  GLUCOSE 105* 99  BUN 16 20  CREATININE 0.72 0.74  CALCIUM 9.1 8.5*  PROT  --  5.3*  ALBUMIN  --  2.6*  AST  --  26  ALT  --  15  ALKPHOS  --  42  BILITOT  --  1.6*  GFRNONAA >60 >60  GFRAA >60 >60  ANIONGAP 11 9     Hematology Recent Labs  Lab 10/21/18 0857 10/21/18 2329 10/22/18 0407  WBC 11.8* 11.0* 10.7*  RBC 3.96* 3.85* 3.81*  HGB 13.4 12.6* 12.6*  HCT 39.9 37.9* 38.1*  MCV 100.8* 98.4 100.0  MCH 33.8 32.7 33.1  MCHC 33.6 33.2 33.1  RDW 13.4 13.5 13.7  PLT 138* 121* 124*    Cardiac EnzymesNo results for input(s): TROPONINI in the last 168 hours. No results for input(s): TROPIPOC in the last 168 hours.   BNPNo results for input(s): BNP, PROBNP in the last 168 hours.   DDimer No results for input(s): DDIMER in the last 168  hours.   Radiology    Pelvis Portable  Result Date: 10/22/2018 CLINICAL DATA:  Post op left hip bipolar hemiarthroplasty. EXAM: PORTABLE PELVIS 1-2 VIEWS COMPARISON:  Left hip radiographs 10/20/2018. FINDINGS: 1115 hours. The bones are demineralized. Patient has undergone interval left hip bipolar hemiarthroplasty. The hardware appears well positioned. No evidence of acute fracture or dislocation. There is soft tissue emphysema overlying the left greater trochanter. Mild right hip degenerative changes are present. IMPRESSION: No demonstrated complication following left hip bipolar hemiarthroplasty. Electronically Signed   By: [name] M.D.   On: 10/22/2018 13:28   Dg Femur Port Min 2 Views Left  Result Date: 10/22/2018 CLINICAL DATA:  Left hip bipolar hemiarthroplasty. EXAM: LEFT FEMUR PORTABLE 2 VIEWS COMPARISON:  Left hip radiographs 10/20/2018. Left knee radiographs 04/21/2006.  FINDINGS: Interval left hip bipolar hemiarthroplasty without complication. Remote left total knee arthroplasty appears unchanged. No evidence of acute fracture or dislocation. There is some soft tissue emphysema in the proximal thigh and around the greater trochanter. IMPRESSION: No demonstrated complication following left hip bipolar hemiarthroplasty. Electronically Signed   By: [name] M.D.   On: 10/22/2018 13:30    Cardiac Studies     Patient Profile     85 y.o. male with a hx of paroxysmal atrial fibrillation, atrial tachycardia, prior pacemaker who was seen for preoperative evaluation prior to repair of hip fracture and paroxysmal atrial fibrillation. Pt underwent hip surgery on 10/22/18.   Assessment & Plan    Paroxysmal atrial fibrillation -maintained on sotalol as an outpatient.  AFib burden on pacemaker interrogation 09/07/2018 was 38% -Pt was in sinus rhythm on admission but seen yesterday for afib with rates up to the 120's. Pt was given 2 doses of IV metoprolol. BP was soft so he was given 2 doses of digoxin 0.25 mg. Sotalol was stopped  (last dose at 1822 last evening) with plan to start amiodarone once washed out, probably Sunday morning. -Currently rhythm is afib with rate 100-110.  -Will start metoprolol 12.5 mg bid for rate control until able to start amiodarone.  -Pt is anticoagulated on warfarin at home for stroke risk reduction with CHA2DS2/VAS Stroke Risk Score of 2 for age only, now on hold for surgery.  -there is the question of whether or not to resume anticoagulation when OK with surgeon. With the low CHA2DS2 score it would not be unreasonable to not resume anticoagulation in the setting of frequent falls. The patient has had no head injuries with his falls. Discussed options with the patient's son and hopefully he will discuss with the rest of the family and help to make the decision. If he is to be anticoagulated, would recommend apixaban with its lower bleeding  risk profile. Also with initiation of amiodarone this would be a better option.   Prior pacemaker -Followed by Dr. [name].   Left femoral neck fracture -Management per orthopedics.  Arthroplasty of the hip today. Pt appears to be doing well, very sleepy now.   Hypokalemia -K+ 3.3 this morning. Potassium replacement has been ordered.  -Magnesium low normal at 1.7.  -Monitor electrolytes      For questions or updates, please contact [facility] Please consult [email or web address] for contact info under        Signed, Berton Bon, NP  10/22/2018, 2:33 PM

## 2018-10-22 NOTE — Plan of Care (Signed)
  Problem: Education: Goal: Knowledge of General Education information will improve Description Including pain rating scale, medication(s)/side effects and non-pharmacologic comfort measures Outcome: Progressing   Problem: Self-Concept: Goal: Ability to maintain and perform role responsibilities to the fullest extent possible will improve Outcome: Progressing   Problem: Pain Management: Goal: Pain level will decrease Outcome: Progressing

## 2018-10-22 NOTE — Anesthesia Procedure Notes (Signed)
Procedure Name: Intubation Date/Time: 10/22/2018 8:15 AM Performed by: Bryson Corona, CRNA Pre-anesthesia Checklist: Patient identified, Emergency Drugs available, Suction available and Patient being monitored Patient Re-evaluated:Patient Re-evaluated prior to induction Oxygen Delivery Method: Circle System Utilized Preoxygenation: Pre-oxygenation with 100% oxygen Induction Type: IV induction Ventilation: Oral airway inserted - appropriate to patient size Laryngoscope Size: Mac and 3 Grade View: Grade I Tube type: Oral Tube size: 7.0 mm Number of attempts: 1 Airway Equipment and Method: Stylet and Oral airway Placement Confirmation: ETT inserted through vocal cords under direct vision,  positive ETCO2 and breath sounds checked- equal and bilateral Secured at: 22 cm Tube secured with: Tape Dental Injury: Teeth and Oropharynx as per pre-operative assessment

## 2018-10-22 NOTE — Plan of Care (Signed)
  Problem: Activity: Goal: Ability to ambulate and perform ADLs will improve Outcome: Progressing   Problem: Pain Management: Goal: Pain level will decrease Outcome: Progressing   

## 2018-10-22 NOTE — Progress Notes (Signed)
PROGRESS NOTE    [name]  BDZ:[id number] [name]: [birth year] DOA: 10/20/2018 PCP: [name], MD  Brief Narrative:  HPI per [name] on 10/20/2018 [name] is a 85 y.o. male with medical history significant of pacemaker placement; and afib presenting with a fall.   He got out of bed and was moving into his electric scooter.  He lost his balance while transferring and fell on his side.   He uses a walker for transfers but uses his scooter most of the time.  This is due to scoliosis and decreased leg strength.  He also had a bad knee on his right leg and now injured his left leg.  No dizziness, mechanical fall only.  **Surgical intervention was canceled this morning due to his atrial fibrillation with RVR.  He was given Betapace along with metoprolol IV and still has elevated heart rate so Cardiology was consulted for further evaluation recommendations patient's sotalol was stopped and he was given 0.25 mg of IV digoxin twice yesterday.  [name] will discuss with Dr. [name] about stopping his anticoagulation as risks outweigh the benefits and cardiology plans to repeating echocardiogram  Assessment & Plan:   Principal Problem:   Left displaced femoral neck fracture (HCC) Active Problems:   ATRIAL FIBRILLATION   Essential hypertension, benign   Chronic pain syndrome   Memory loss   Protein-calorie malnutrition, severe  Left Displaced Femoral Neck Fracture status post surgical intervention this morning -Mechanical fall resulting in hip fracture -Orthopedics consulted and repaired this morning as was unable to do yesterday because his heart rate was too elevated -NPO after midnight in anticipation of surgical repair but he is currently n.p.o. again given his intubation and will have speech therapy evaluate for swallow eval -SCDs overnight, Resume Coumadin post-operatively per Ortho versus stopping anticoagulation and is putting him on VTE prophylaxis.  Will  defer this to Cardiology and orthopedic surgery and he has been started on enoxaparin 40 mg subcu every 24 -Pain control with Robxain, Vicodin, and Morphine prn as below -SW consult for rehab placement - prefers [facility] -Will need PT consult post-operatively -Hip fracture order set utilized; pain control as below -Continue with Cepacol lozenges and phenol mouth spray for sore throat after intubation -Antiemetics with Zofran 4 mg every 6 as needed nausea with po/IV Reglan 5 to 10 mg if Zofran is ineffective -Bowel regimen with docusate 100 mg p.o. twice daily  Paroxysmal A Fib, on Coumadin Atrial Tachycardia Tachycardia-Bradycardia Syndrom s/p PPM -Usually Rate controlled with pacemaker but rates Uncontrolled and running high -Has prn Sotalol for breakthrough tachycardia and takes 160 mg po BIDprn for HR>160 and this has been stopped by cardiology -Was given 2.5 mg of Lopressor x2 without success -On Coumadin for [name] and was given vitamin K to reverse his coagulopathy prior to surgery -Repeat INR was 1.32 this morning -Cardiology consulted for further evaluation and recommendations given his Tachycardia and mild Hypotension  -[name] evaluated and recommended giving the patient IV digoxin 0.25 mg every 6 hours x 2 doses; [name] had recommended holding sotalol and once that is cleared begin IV amiodarone to assist with rate and rhythm control -Questionable other concerning anticoagulation given his recurrent falls and [name] will discuss with Dr. [name] about stopping his anticoagulation  HTN -Takes prn Sotalol, otherwise not on BP medications routinely -BP was on the lower side and was given a bolus yesterday -Hold Antihypertensives and have asked Cardiology to Weigh in -  Pressure is now improved and is 128/75  Chronic pain DJD/DDD -Continue Hydrocodone-Acetaminophen 10-325 mg q4hprn with addition of Morphine 0.5 mg q2hprn for breakthrough/severe pain -Continue  with methocarbamol 5 mg p.o./IV every 6 PRN for muscle spasms  Memory Loss -Patient appeared to have some difficulty answering all questions and continues to do so -Wife confirms some question of early dementia -Outpatient workup   GERD -Continue to Monitor for now  Leukocytosis, improving -In the setting of Fall and Fracture -WBC went from 11.4 -> 11.8 -> 10.7 -Continue to Monitor for S/Sx of Infection; Received Preoperative Abx already -Repeat CBC in AM  Thrombocytopenia, worsening  -In the setting of Fall; Platelet Count went from 179 -> 138 -> 124 -Takes Coumadin at Home but this is currently being held -Continue to Monitor for S/Sx of Bleeding -Repeat CBC in AM   Hyperglycemia -Patient's Glucose level on CMP was 105 and this AM was 99 -Likely Reactive but has no HbA1c on file  -Continue to Monitor Blood Sugars carefully and if remaining elevated will place on Sensitive Novolog SSI  Hypokalemia -Patient's potassium this morning was 3.3 -Replete with IV KCl 40 mEq -Continue to replete as necessary -Repeat CMP in a.m.  Hypophosphatemia -Patient's phosphorus level this morning was 2.2 -Replete with IV 10 mmol of K-Phos x1 -Continue monitor replete as necessary -Repeat phosphorus level in a.m.  Hyperbilirubinemia -T bili this morning is 1.6 -Likely reactive -Continue monitor and trend and repeat CMP in a.m.  Severe Malnutrition in the Context of Chronic Illness -Nutritionist consult for further evaluation recommendations -Continue Ensure Enlive p.o. 3 times daily as well as multivitamin  DVT prophylaxis: SCDs for now and will hold his Home Coumadin; he is now on enoxaparin 40 mg subcu every 24 Code Status: FULL CODE Family Communication: Wife at bedside Disposition Plan: Pending repeat evaluation but likely SNF  Consultants:   Orthopedic Surgery Dr. Carola Frost  Cardiology    Procedures:  Hemiarthroplasty of Left Hip   Antimicrobials:  Anti-infectives (From  admission, onward)   Start     Dose/Rate Route Frequency Ordered Stop   10/22/18 2000  vancomycin (VANCOCIN) IVPB 1000 mg/200 mL premix     1,000 mg 200 mL/hr over 60 Minutes Intravenous Every 12 hours 10/22/18 1213 10/23/18 0759   10/22/18 0745  vancomycin (VANCOCIN) IVPB 1000 mg/200 mL premix     1,000 mg 200 mL/hr over 60 Minutes Intravenous  Once 10/22/18 0741 10/22/18 0846   10/21/18 1300  ceFAZolin (ANCEF) IVPB 2g/100 mL premix  Status:  Discontinued     2 g 200 mL/hr over 30 Minutes Intravenous To ShortStay Surgical 10/20/18 1932 10/21/18 0938   10/21/18 1015  ceFAZolin (ANCEF) IVPB 2g/100 mL premix     2 g 200 mL/hr over 30 Minutes Intravenous On call to O.R. 10/21/18 1006 10/22/18 0559   10/21/18 0800  ceFAZolin (ANCEF) IVPB 2g/100 mL premix  Status:  Discontinued     2 g 200 mL/hr over 30 Minutes Intravenous To ShortStay Surgical 10/20/18 1924 10/20/18 1932     Subjective: Seen and examined at bedside surgery and is wanting to rest.  Denies any chest pain and states he is leg felt okay.  No nausea or vomiting.  Wife at bedside and she was updated  Objective: Vitals:   10/22/18 1045 10/22/18 1100 10/22/18 1115 10/22/18 1213  BP: (!) 144/89 132/80 116/80 128/75  Pulse: 71 81  96  Resp: (!) 21 16 20 16   Temp:   97.9 F (36.6  C) 98.1 F (36.7 C)  TempSrc:    Oral  SpO2: 98% 96% 92% 96%  Weight:      Height:        Intake/Output Summary (Last 24 hours) at 10/22/2018 1359 Last data filed at 10/22/2018 1029 Gross per 24 hour  Intake 1300 ml  Output 100 ml  Net 1200 ml   Filed Weights   10/20/18 2000 10/21/18 0740 10/22/18 0731  Weight: 65.2 kg 65.2 kg 65.2 kg   Examination: Physical Exam:  Constitutional: Thin elderly Caucasian male currently no acute distress appears calm and wanting to rest at bedside after his surgical intervention Eyes: Conjunctive are normal.  Sclera anicteric ENMT: External ears and nose appear normal.  Grossly normal hearing Neck: Appears  supple no JVD Respiratory: Diminished auscultation bilaterally no appreciable wheezing, rales, rhonchi.  Patient not tachypneic wheezing and accessory muscle breathe Cardiovascular: Tachycardic and irregularly irregular rhythm.  No extremity edema noted but does have chronic lower extremity venous stasis changes Abdomen: Soft, nontender, nondistended.  Bowel sounds present GU: Deferred Musculoskeletal: Right knee with deformity/effusion and left hip has been surgically fixed Skin: No appreciable rashes or ulcers noted on limited skin evaluation but does have chronic lower extremity venous stasis changes and does have a seborrheic keratosis on face Neurologic: Cranial nerve II through XII grossly intact no appreciable focal deficits Psychiatric: Patient was somnolent and wanted to sleep but easily arousable.  He answered some questions and has a normal mood  Data Reviewed: I have personally reviewed following labs and imaging studies  CBC: Recent Labs  Lab 10/20/18 1039 10/21/18 0857 10/21/18 2329 10/22/18 0407  WBC 11.4* 11.8* 11.0* 10.7*  NEUTROABS 9.3* 9.8*  --  8.6*  HGB 13.4 13.4 12.6* 12.6*  HCT 42.3 39.9 37.9* 38.1*  MCV 101.9* 100.8* 98.4 100.0  PLT 179 138* 121* 124*   Basic Metabolic Panel: Recent Labs  Lab 10/20/18 1318 10/22/18 0407  NA 136 137  K 4.0 3.3*  CL 96* 101  CO2 29 27  GLUCOSE 105* 99  BUN 16 20  CREATININE 0.72 0.74  CALCIUM 9.1 8.5*  MG  --  1.7  PHOS  --  2.2*   GFR: Estimated Creatinine Clearance: 58.7 mL/min (by C-G formula based on SCr of 0.74 mg/dL). Liver Function Tests: Recent Labs  Lab 10/22/18 0407  AST 26  ALT 15  ALKPHOS 42  BILITOT 1.6*  PROT 5.3*  ALBUMIN 2.6*   No results for input(s): LIPASE, AMYLASE in the last 168 hours. No results for input(s): AMMONIA in the last 168 hours. Coagulation Profile: Recent Labs  Lab 10/20/18 1105 10/21/18 0248 10/22/18 0407  INR 2.21 1.50 1.32   Cardiac Enzymes: No results for  input(s): CKTOTAL, CKMB, CKMBINDEX, TROPONINI in the last 168 hours. BNP (last 3 results) No results for input(s): PROBNP in the last 8760 hours. HbA1C: No results for input(s): HGBA1C in the last 72 hours. CBG: No results for input(s): GLUCAP in the last 168 hours. Lipid Profile: No results for input(s): CHOL, HDL, LDLCALC, TRIG, CHOLHDL, LDLDIRECT in the last 72 hours. Thyroid Function Tests: No results for input(s): TSH, T4TOTAL, FREET4, T3FREE, THYROIDAB in the last 72 hours. Anemia Panel: No results for input(s): VITAMINB12, FOLATE, FERRITIN, TIBC, IRON, RETICCTPCT in the last 72 hours. Sepsis Labs: No results for input(s): PROCALCITON, LATICACIDVEN in the last 168 hours.  Recent Results (from the past 240 hour(s))  MRSA PCR Screening     Status: Abnormal   Collection Time: 10/20/18  6:54 PM  Result Value Ref Range Status   MRSA by PCR POSITIVE (A) NEGATIVE Final    Comment:        The GeneXpert MRSA Assay (FDA approved for NASAL specimens only), is one component of a comprehensive MRSA colonization surveillance program. It is not intended to diagnose MRSA infection nor to guide or monitor treatment for MRSA infections. RESULT CALLED TO, READ BACK BY AND VERIFIED WITH: Darcella Cheshire MARTIN RN 2209 10/20/18 A BROWNING Performed at [facility], [address] [place], [place] [address]      RN Pressure Injury Documentation:     Estimated body mass index is 23.2 kg/m as calculated from the following:   Height as of this encounter: 5\' 6"  (1.676 m).   Weight as of this encounter: 65.2 kg.  Malnutrition Type:  Nutrition Problem: Severe Malnutrition Etiology: chronic illness(CAD)   Malnutrition Characteristics:  Signs/Symptoms: severe fat depletion, severe muscle depletion   Nutrition Interventions:  Interventions: Ensure Enlive (each supplement provides 350kcal and 20 grams of protein), MVI    Radiology Studies: Pelvis Portable  Result Date:  10/22/2018 CLINICAL DATA:  Post op left hip bipolar hemiarthroplasty. EXAM: PORTABLE PELVIS 1-2 VIEWS COMPARISON:  Left hip radiographs 10/20/2018. FINDINGS: 1115 hours. The bones are demineralized. Patient has undergone interval left hip bipolar hemiarthroplasty. The hardware appears well positioned. No evidence of acute fracture or dislocation. There is soft tissue emphysema overlying the left greater trochanter. Mild right hip degenerative changes are present. IMPRESSION: No demonstrated complication following left hip bipolar hemiarthroplasty. Electronically Signed   By: Carey BullocksWilliam  Veazey M.D.   On: 10/22/2018 13:28   Dg Femur Port Min 2 Views Left  Result Date: 10/22/2018 CLINICAL DATA:  Left hip bipolar hemiarthroplasty. EXAM: LEFT FEMUR PORTABLE 2 VIEWS COMPARISON:  Left hip radiographs 10/20/2018. Left knee radiographs 04/21/2006. FINDINGS: Interval left hip bipolar hemiarthroplasty without complication. Remote left total knee arthroplasty appears unchanged. No evidence of acute fracture or dislocation. There is some soft tissue emphysema in the proximal thigh and around the greater trochanter. IMPRESSION: No demonstrated complication following left hip bipolar hemiarthroplasty. Electronically Signed   By: Carey BullocksWilliam  Veazey M.D.   On: 10/22/2018 13:30    Scheduled Meds: . docusate sodium  100 mg Oral BID  . [START ON 10/23/2018] enoxaparin (LOVENOX) injection  40 mg Subcutaneous Q24H  . feeding supplement (ENSURE ENLIVE)  237 mL Oral TID BM  . multivitamin with minerals  1 tablet Oral Daily  . mupirocin ointment  1 application Nasal BID   Continuous Infusions: . lactated ringers Stopped (10/22/18 1017)  . lactated ringers 10 mL/hr at 10/22/18 0747  . methocarbamol (ROBAXIN) IV    . potassium chloride    . potassium PHOSPHATE IVPB (in mmol)    . vancomycin      LOS: 2 days   Merlene Laughtermair Latif , DO [facility] PAGER is on AMION  If 7PM-7AM, please contact  night-coverage [email or web address] Password [email or web address] 10/22/2018, 1:59 PM

## 2018-10-22 NOTE — Progress Notes (Signed)
Appreciate Dr. Ludwig Clarks evaluation and management. Decision about long term anticoagulation deferred to Dr. [name]. Rate is well controlled this am.  I once more discussed with the patient and his wife the risks and benefits of surgery for his left hip, including the possibility of infection, nerve injury, vessel injury, wound breakdown, arthritis, symptomatic hardware, DVT/ PE, loss of motion, malunion, nonunion, and need for further surgery among others. They acknowledged these risks and wished to proceed.  Myrene Galas, MD [facility], [facility] [phone]

## 2018-10-22 NOTE — Progress Notes (Signed)
MD on call from [facility] paged d/t pt HR continuing to be in the 130-140's. Awaiting response.

## 2018-10-22 NOTE — Transfer of Care (Signed)
Immediate Anesthesia Transfer of Care Note  Patient: [name]  Procedure(s) Performed: ARTHROPLASTY BIPOLAR HIP (HEMIARTHROPLASTY) (Left )  Patient Location: PACU  Anesthesia Type:General  Level of Consciousness: drowsy  Airway & Oxygen Therapy: Patient Spontanous Breathing and Patient connected to face mask oxygen  Post-op Assessment: Report given to RN and Post -op Vital signs reviewed and stable  Post vital signs: Reviewed and stable  Last Vitals:  Vitals Value Taken Time  BP 140/99 10/22/2018 10:27 AM  Temp    Pulse    Resp 21 10/22/2018 10:29 AM  SpO2    Vitals shown include unvalidated device data.  Last Pain:  Vitals:   10/22/18 0524  TempSrc: Oral  PainSc:       Patients Stated Pain Goal: (pain go-mild) (10/20/18 2144)  Complications: No apparent anesthesia complications

## 2018-10-22 NOTE — Clinical Social Work Note (Signed)
Clinical Social Work Assessment  Patient Details  Name: [name] MRN: [id number] Date of Birth: [birth year]  Date of referral:  10/22/18               Reason for consult:  Discharge Planning                Permission sought to share information with:  Case Manager, Facility Medical sales representative, Family Supports Permission granted to share information::  Yes, Verbal Permission Granted  Name::     [name]  Agency::  SNFs  Relationship::  spouse  Contact Information:  [phone]  Housing/Transportation Living arrangements for the past 2 months:  Single Family Home Source of Information:  Patient, Adult Children Patient Interpreter Needed:  None Criminal Activity/Legal Involvement Pertinent to Current Situation/Hospitalization:  No - Comment as needed Significant Relationships:  Adult Children Lives with:  Self Do you feel safe going back to the place where you live?  No Need for family participation in patient care:  Yes (Comment)  Care giving concerns:  CSW received referral for possible SNF placement at time of discharge. Spoke with patient regarding possibility of SNF placement . Patient's family   is currently unable to care for her at their home given patient's current needs and fall risk.  Patient and son [name] at bedside   expressed understanding of PT recommendation and are agreeable to SNF placement at time of discharge. CSW to continue to follow and assist with discharge planning needs.    Social Worker assessment / plan:  Spoke with patient and  Son [name] at bedside  concerning possibility of rehab at [facility] before returning home.    Employment status:  Retired Health and safety inspector:  Medicare PT Recommendations:  Skilled Nursing Facility Information / Referral to community resources:  Skilled Nursing Facility  Patient/Family's Response to care:  Patient and  Son [name] at bedside recognize need for rehab before returning home and are agreeable to a SNF in [place].  They report preference for [facility] . CSW explained insurance authorization process. Patient's family reported that they want patient to get stronger to be able to come back home.    Patient/Family's Understanding of and Emotional Response to Diagnosis, Current Treatment, and Prognosis:  Patient/family is realistic regarding therapy needs and expressed being hopeful for SNF placement. Patient expressed understanding of CSW role and discharge process as well as medical condition. No questions/concerns about plan or treatment.    Emotional Assessment Appearance:  Appears stated age Attitude/Demeanor/Rapport:  Lethargic Affect (typically observed):  Unable to Assess Orientation:  Oriented to Self, Oriented to Place, Oriented to Situation Alcohol / Substance use:  Not Applicable Psych involvement (Current and /or in the community):  No (Comment)  Discharge Needs  Concerns to be addressed:  Discharge Planning Concerns Readmission within the last 30 days:  No Current discharge risk:  Dependent with Mobility Barriers to Discharge:  Continued Medical Work up   [employer], LCSW 10/22/2018, 4:06 PM

## 2018-10-22 NOTE — [place] FL2 (Signed)
[place] MEDICAID FL2 LEVEL OF CARE SCREENING TOOL     IDENTIFICATION  Patient Name: [name] Birthdate: 09-04-1929 Sex: male Admission Date (Current Location): 10/20/2018  [facility] and [place] Number:  Chiropodist and Address:  [facility], [address] [place], [place] [address]      Provider Number: [id number]  Attending Physician Name and Address:  Merlene Laughter, DO  Relative Name and Phone Number:  Park Liter (spouse) [phone]    Current Level of Care: Hospital Recommended Level of Care: Skilled Nursing Facility Prior Approval Number:    Date Approved/Denied: 04/23/06 PASRR Number: [id number]  Discharge Plan: SNF    Current Diagnoses: Patient Active Problem List   Diagnosis Date Noted  . Protein-calorie malnutrition, severe 10/21/2018  . Left displaced femoral neck fracture (HCC) 10/20/2018  . Memory loss 10/20/2018  . Long-term current use of opiate analgesic 09/24/2017  . Chronic low back pain 09/24/2017  . On long term drug therapy 09/18/2017  . Chronic neck pain 09/18/2017  . Chronic pain syndrome 09/18/2017  . Diarrhea, unspecified 06/02/2017  . Essential hypertension, benign 11/06/2010  . ATRIAL FIBRILLATION 08/07/2010  . BRADYCARDIA-TACHYCARDIA SYNDROME 08/07/2010    Orientation RESPIRATION BLADDER Height & Weight     Self, Situation, Place  Normal Incontinent, External catheter Weight: 65.2 kg Height:  5\' 6"  (167.6 cm)  BEHAVIORAL SYMPTOMS/MOOD NEUROLOGICAL BOWEL NUTRITION STATUS      Continent Diet(see discharge summary)  AMBULATORY STATUS COMMUNICATION OF NEEDS Skin   Extensive Assist Verbally Surgical wounds(left hip closed surgical incision)                       Personal Care Assistance Level of Assistance  Bathing, Feeding, Dressing Bathing Assistance: Maximum assistance Feeding assistance: Independent Dressing Assistance: Maximum assistance     Functional Limitations Info   Sight, Hearing, Speech Sight Info: Adequate Hearing Info: Adequate Speech Info: Adequate    SPECIAL CARE FACTORS FREQUENCY  PT (By licensed PT), OT (By licensed OT)     PT Frequency: min 5x weekly OT Frequency: min 5x weekly            Contractures Contractures Info: Not present    Additional Factors Info  Allergies, Isolation Precautions   Allergies Info: no known allergies     Isolation Precautions Info: contact precautions     Current Medications (10/22/2018):  This is the current hospital active medication list Current Facility-Administered Medications  Medication Dose Route Frequency Provider Last Rate Last Dose  . docusate sodium (COLACE) capsule 100 mg  100 mg Oral BID Montez Morita, PA-C   100 mg at 10/21/18 0957  . [START ON 10/23/2018] enoxaparin (LOVENOX) injection 40 mg  40 mg Subcutaneous Q24H Montez Morita, PA-C      . feeding supplement (ENSURE ENLIVE) (ENSURE ENLIVE) liquid 237 mL  237 mL Oral TID BM Montez Morita, PA-C      . HYDROcodone-acetaminophen (NORCO) 10-325 MG per tablet 1 tablet  1 tablet Oral QID PRN Montez Morita, PA-C      . lactated ringers infusion   Intravenous Continuous Montez Morita, PA-C   Stopped at 10/22/18 1017  . lactated ringers infusion   Intravenous Continuous Montez Morita, PA-C 10 mL/hr at 10/22/18 0747    . menthol-cetylpyridinium (CEPACOL) lozenge 3 mg  1 lozenge Oral PRN Montez Morita, PA-C       Or  . phenol (CHLORASEPTIC) mouth spray 1 spray  1 spray Mouth/Throat PRN Renae Fickle,  Mellody Dance, PA-C      . methocarbamol (ROBAXIN) tablet 500 mg  500 mg Oral Q6H PRN Montez Morita, PA-C   500 mg at 10/20/18 2142   Or  . methocarbamol (ROBAXIN) 500 mg in dextrose 5 % 50 mL IVPB  500 mg Intravenous Q6H PRN Montez Morita, PA-C      . metoCLOPramide (REGLAN) tablet 5-10 mg  5-10 mg Oral Q8H PRN Montez Morita, PA-C       Or  . metoCLOPramide (REGLAN) injection 5-10 mg  5-10 mg Intravenous Q8H PRN Montez Morita, PA-C      . metoprolol tartrate (LOPRESSOR) tablet 12.5  mg  12.5 mg Oral BID Berton Bon, NP   12.5 mg at 10/22/18 1521  . morphine 2 MG/ML injection 0.5 mg  0.5 mg Intravenous Q2H PRN Montez Morita, PA-C   0.5 mg at 10/20/18 2143  . multivitamin with minerals tablet 1 tablet  1 tablet Oral Daily Montez Morita, PA-C   1 tablet at 10/21/18 1647  . mupirocin ointment (BACTROBAN) 2 % 1 application  1 application Nasal BID Montez Morita, PA-C   1 application at 10/21/18 2200  . ondansetron (ZOFRAN) injection 4 mg  4 mg Intravenous Q6H PRN Montez Morita, PA-C      . potassium chloride 10 mEq in 100 mL IVPB  10 mEq Intravenous Q1 Hr x 4 Sheikh, Omair Latif, DO      . potassium PHOSPHATE 10 mmol in dextrose 5 % 250 mL infusion  10 mmol Intravenous Once Sheikh, Omair Latif, DO      . vancomycin (VANCOCIN) IVPB 1000 mg/200 mL premix  1,000 mg Intravenous Q12H Montez Morita, PA-C         Discharge Medications: Please see discharge summary for a list of discharge medications.  Relevant Imaging Results:  Relevant Lab Results:   Additional Information SSN: [id number]  Gildardo Griffes, LCSW

## 2018-10-22 NOTE — Consult Note (Signed)
[facility] CM Primary Care Navigator  10/22/2018  [name] 24-May-1929 [id number]   Attempt to see patient at the bedside to identify possible discharge needs but he is off the unit in the OR for surgery (fall with left displaced femoral neck fracture, for hemiarthroplasty of left hip)  Will attempt tofollow-up andsee patientat another time for further THN-CM needswhen available in the room.     Addendum (10/26/18):  Went back to see patientand met his wife [name]) as well, at the bedsideto identify possible discharge needs.  Patient's wife reports that he had "lost balance causing fall at home and broke left hip" thatresultedto thisadmission/ surgery. (left displaced femoral neck fracture status post hemiarthroplasty of left hip, paroxysmal atrial fibrillation with rapid ventricular response, tachycardia-bradycardia syndrome, HTN, severe malnutrition, fever in the setting of ?Pseudogout)  Patient's wife endorsesDr. Domenick Potts with [facility] as his primary careprovider.   [facility] [address] [facility] obtain medications without difficulty so far. Wife is aware to notify primary care provider for any future needs that patient may have regarding his medications.    Patient's wife has been managinghis medications athome with use of "pill box" system filledonce a week.  Patient'swifehasbeen providing transportation tohisdoctors' appointments.  Patientlives at home with wife- who serves as his primary caregiver. His daughter and son can also be able to assist when needed especially on the weekends.  Anticipated discharge planisskilled nursing facility(SNF- in process) per therapy recommendation.  Patient's wife expressedunderstandingto callhis primary care provider's officeoncehereturns backhomefor a post discharge follow-up within1- 2 weeksor  sooner if needs arise.Patient letter (with PCP's contact number) was providedasareminder.   Explained to patient/ wife about [facility] CM services available for health managementand resources,butwifeindicated having no present issuesor needsat this point. Patient's wifementioned that she has been managing patient's health needs/ issues so far.   Patient's wife expressed understanding to seekreferral to [facility] care management from primary care provider if deemed necessary and appropriateforanyservicesinthenearfuture- once patient returns to home from rehab facility.  [facility] care management information provided for future needs that patient may have.  Primary care provider's office is listed as providing transition of care (TOC).    For additional questions please contact:  [name], [name], RN-BC [facility] PRIMARY CARE Navigator Cell: [phone]

## 2018-10-23 LAB — COMPREHENSIVE METABOLIC PANEL
ALT: 19 U/L (ref 0–44)
AST: 31 U/L (ref 15–41)
Albumin: 2.4 g/dL — ABNORMAL LOW (ref 3.5–5.0)
Alkaline Phosphatase: 37 U/L — ABNORMAL LOW (ref 38–126)
Anion gap: 4 — ABNORMAL LOW (ref 5–15)
BUN: 21 mg/dL (ref 8–23)
CHLORIDE: 103 mmol/L (ref 98–111)
CO2: 27 mmol/L (ref 22–32)
Calcium: 8 mg/dL — ABNORMAL LOW (ref 8.9–10.3)
Creatinine, Ser: 0.79 mg/dL (ref 0.61–1.24)
GFR calc non Af Amer: >60 mL/min (ref >60)
Glucose, Bld: 95 mg/dL (ref 70–99)
Potassium: 4.3 mmol/L (ref 3.5–5.1)
Sodium: 134 mmol/L — ABNORMAL LOW (ref 135–145)
Total Bilirubin: 1.8 mg/dL — ABNORMAL HIGH (ref 0.3–1.2)
Total Protein: 5.1 g/dL — ABNORMAL LOW (ref 6.5–8.1)

## 2018-10-23 LAB — CBC WITH DIFFERENTIAL/PLATELET
Abs Immature Granulocytes: 0.06 10*3/uL (ref 0.00–0.07)
BASOS PCT: 0 %
Basophils Absolute: 0 10*3/uL (ref 0.0–0.1)
Eosinophils Absolute: 0.1 10*3/uL (ref 0.0–0.5)
Eosinophils Relative: 1 %
HCT: 37.6 % — ABNORMAL LOW (ref 39.0–52.0)
Hemoglobin: 12.1 g/dL — ABNORMAL LOW (ref 13.0–17.0)
Immature Granulocytes: 1 %
Lymphocytes Relative: 10 %
Lymphs Abs: 1.1 10*3/uL (ref 0.7–4.0)
MCH: 32.6 pg (ref 26.0–34.0)
MCHC: 32.2 g/dL (ref 30.0–36.0)
MCV: 101.3 fL — AB (ref 80.0–100.0)
MONOS PCT: 12 %
Monocytes Absolute: 1.3 10*3/uL — ABNORMAL HIGH (ref 0.1–1.0)
Neutro Abs: 8.2 10*3/uL — ABNORMAL HIGH (ref 1.7–7.7)
Neutrophils Relative %: 76 %
PLATELETS: 121 10*3/uL — AB (ref 150–400)
RBC: 3.71 MIL/uL — ABNORMAL LOW (ref 4.22–5.81)
RDW: 13.3 % (ref 11.5–15.5)
WBC: 10.8 10*3/uL — ABNORMAL HIGH (ref 4.0–10.5)
nRBC: 0 % (ref 0.0–0.2)

## 2018-10-23 LAB — PROTIME-INR
INR: 1.36
Prothrombin Time: 16.6 seconds — ABNORMAL HIGH (ref 11.4–15.2)

## 2018-10-23 LAB — PHOSPHORUS: Phosphorus: 3 mg/dL (ref 2.5–4.6)

## 2018-10-23 LAB — MAGNESIUM: Magnesium: 1.7 mg/dL (ref 1.7–2.4)

## 2018-10-23 LAB — VITAMIN D 25 HYDROXY (VIT D DEFICIENCY, FRACTURES): Vit D, 25-Hydroxy: 43.8 ng/mL (ref 30.0–100.0)

## 2018-10-23 MED ORDER — MAGNESIUM SULFATE 2 GM/50ML IV SOLN
2.0000 g | Freq: Once | INTRAVENOUS | Status: AC
  Administered 2018-10-23: 2 g via INTRAVENOUS
  Filled 2018-10-23: qty 50

## 2018-10-23 MED ORDER — SODIUM CHLORIDE 0.9 % IV BOLUS
1000.0000 mL | Freq: Once | INTRAVENOUS | Status: AC
  Administered 2018-10-23: 1000 mL via INTRAVENOUS

## 2018-10-23 NOTE — Progress Notes (Signed)
10/23/18 1529  PT Visit Information  Last PT Received On 10/23/18  Assistance Needed +2  PT/OT/SLP Co-Evaluation/Treatment Yes  Reason for Co-Treatment Complexity of the patient's impairments (multi-system involvement);To address functional/ADL transfers;For patient/therapist safety  PT goals addressed during session Mobility/safety with mobility;Proper use of DME  History of Present Illness Pt is an 85 yo male s/p L hemiarthroplasty secondary to fall out of scooter. PMHx: R knee pain, pacemaker, afib, chronic pain syndrome.  Precautions  Precautions Fall;Posterior Hip  Precaution Booklet Issued No  Precaution Comments educated about precautions; assumed posterior precautions as pt in L knee immobilizer following surgery.   Required Braces or Orthoses Knee Immobilizer - Left  Restrictions  Weight Bearing Restrictions Yes  RLE Weight Bearing WBAT  LLE Weight Bearing WBAT  Home Living  Family/patient expects to be discharged to: Skilled nursing facility  Living Arrangements Spouse/significant other  Available Help at Discharge Family;Available PRN/intermittently (spouse works part time)  Type of Home Mobile home  Home Access Ramped entrance (in back)  Home Layout One level  Bathroom Shower/Tub Other (comment) (pt does not use; sink bath )  Production assistant, radio chair;Wheelchair - manual;Toilet riser  Additional Comments spouse works part time  Prior Function  Level of Independence Needs assistance  Gait / Transfers Assistance Needed Uses scooter or transport chair at baseline. Able to trasfer to scooter or transport chair independently.   ADL's / Homemaking Assistance Needed Requires assist with bathing tasks.   Communication  Communication No difficulties  Pain Assessment  Pain Assessment Faces  Faces Pain Scale 6  Pain Location L hip  Pain Descriptors / Indicators Moaning;Aching;Discomfort  Pain Intervention(s)  Limited activity within patient's tolerance;Monitored during session;Repositioned  Cognition  Arousal/Alertness Lethargic;Suspect due to medications  Behavior During Therapy Agitated  Overall Cognitive Status Impaired/Different from baseline  Area of Impairment Safety/judgement;Following commands;Problem solving  Following Commands Follows one step commands with increased time  Safety/Judgement Decreased awareness of safety;Decreased awareness of deficits  Problem Solving Slow processing;Difficulty sequencing  General Comments Pt requiring assist for orientation- pt asking for his "blue hat before he goes.."  Upper Extremity Assessment  Upper Extremity Assessment Defer to OT evaluation  Lower Extremity Assessment  Lower Extremity Assessment RLE deficits/detail;LLE deficits/detail  RLE Deficits / Details R knee is painful prior to this fall  LLE Deficits / Details immobilized s/p hip sx  Cervical / Trunk Assessment  Cervical / Trunk Assessment Other exceptions (leaning to R side)  Cervical / Trunk Exceptions Scoliotic curvature   Bed Mobility  Overal bed mobility Needs Assistance  Bed Mobility Supine to Sit;Sit to Supine  Supine to sit Total assist;+2 for physical assistance  Sit to supine Total assist;+2 for physical assistance  General bed mobility comments total A +2 for bed mobility. Use of helicopter technique and bed pad to assist with bed mobility tasks.   Transfers  Overall transfer level Needs assistance  Equipment used 2 person hand held assist  Transfers Sit to/from Stand  Sit to Stand Total assist;+2 physical assistance;+2 safety/equipment (unable to perform due to lethargy and weakness)  General transfer comment Attempted sit to stand +2 with totalA, however, unsuccesful. pt unable to assist- L knee immobilized and it was his strong LE. pt slumped in bed leaning to right and posteriorly.  Balance  Overall balance assessment Needs assistance;History of Falls   Sitting-balance support Bilateral upper extremity supported  Sitting balance-Leahy Scale Poor  Sitting balance - Comments Posterior and R lateral  lean in sitting.   Standing balance comment unable to perform stand  General Comments  General comments (skin integrity, edema, etc.) Pt wife present during session.   PT - End of Session  Equipment Utilized During Treatment Gait belt  Activity Tolerance Patient limited by lethargy;Patient limited by pain  Patient left in bed;with call bell/phone within reach;with bed alarm set;with family/visitor present  Nurse Communication Mobility status  PT Assessment  PT Recommendation/Assessment Patient needs continued PT services  PT Visit Diagnosis Unsteadiness on feet (R26.81);Other abnormalities of gait and mobility (R26.89);Muscle weakness (generalized) (M62.81);Difficulty in walking, not elsewhere classified (R26.2)  PT Problem List Decreased strength;Decreased activity tolerance;Decreased balance;Decreased range of motion;Decreased mobility;Decreased cognition;Decreased knowledge of use of DME;Decreased safety awareness;Decreased knowledge of precautions;Pain  PT Plan  PT Frequency (ACUTE ONLY) Min 3X/week  PT Treatment/Interventions (ACUTE ONLY) DME instruction;Functional mobility training;Therapeutic activities;Therapeutic exercise;Balance training;Wheelchair mobility training;Patient/family education  AM-PAC PT "6 Clicks" Mobility Outcome Measure (Version 2)  Help needed turning from your back to your side while in a flat bed without using bedrails? 1  Help needed moving from lying on your back to sitting on the side of a flat bed without using bedrails? 1  Help needed moving to and from a bed to a chair (including a wheelchair)? 1  Help needed standing up from a chair using your arms (e.g., wheelchair or bedside chair)? 1  Help needed to walk in hospital room? 1  Help needed climbing 3-5 steps with a railing?  1  6 Click Score 6  Consider  Recommendation of Discharge To: CIR/SNF/LTACH  PT Recommendation  Follow Up Recommendations SNF;Supervision/Assistance - 24 hour  PT equipment None recommended by PT  Individuals Consulted  Consulted and Agree with Results and Recommendations Patient;Family member/caregiver  Family Member Consulted wife  Acute Rehab PT Goals  Patient Stated Goal want to go home  PT Goal Formulation With patient  Time For Goal Achievement 11/06/18  Potential to Achieve Goals Fair  PT Time Calculation  PT Start Time (ACUTE ONLY) 1354  PT Stop Time (ACUTE ONLY) 1425  PT Time Calculation (min) (ACUTE ONLY) 31 min  PT General Charges  $$ ACUTE PT VISIT 1 Visit  PT Evaluation  $PT Eval Moderate Complexity 1 Mod   Pt s/p surgery above with deficits below. Pt requiring total A +2 for bed mobility tasks. Attempted to stand, however, unable to clear bottom from EOB with total A +2. Assumed posterior hip precautions following surgery, however, will need to clarify. Pt at increased risk for falls and will require increased assist at d/c. Will continue to follow acutely to maximize functional mobility independence and safety.   Gladys Damme, PT, DPT  Acute Rehabilitation Services  Pager: [phone] Office: [phone]

## 2018-10-23 NOTE — Evaluation (Signed)
Occupational Therapy Evaluation Patient Details Name: [name] MRN: [id number] [name]: 01-18-1929 Today's Date: 10/23/2018    History of Present Illness Pt is an 85 yo male s/p fall out of scooter resulting in L hip sx with knee immobilizer present. PMHx: R knee pain, pacemaker, afib, chronic pain syndrome.   Clinical Impression   Pt PTA: living with spouse at home, mostly independent prior with motorized scooter for mobility with RW for transfers. Pt performing own ADLs. Pt currently limited by lethargy and disorientation. Pt oriented to self. Pt following 50% of commands throughout session. Pt performing ADLs with modA to totalA. Pt with decreased sitting balance at EOB requiring assist for sitting upright. Pt unable to perform sit to stand- L LE with knee immobilizer. Pt would greatly benefit from continued OT skilled services for ADL, mobility and safety in SNF setting. OT to follow acutely.    Follow Up Recommendations  SNF;Supervision/Assistance - 24 hour    Equipment Recommendations  3 in 1 bedside commode    Recommendations for Other Services       Precautions / Restrictions Precautions Precautions: Fall;Posterior Hip(assuming posterior hip precs due to knee immob) Precaution Comments: verbalized precautions; wears L knee immobilizer Required Braces or Orthoses: Knee Immobilizer - Left Restrictions Weight Bearing Restrictions: Yes RLE Weight Bearing: Weight bearing as tolerated LLE Weight Bearing: Weight bearing as tolerated      Mobility Bed Mobility Overal bed mobility: Needs Assistance Bed Mobility: Sidelying to Sit;Supine to Sit   Sidelying to sit: Total assist;+2 for physical assistance Supine to sit: Total assist;+2 for physical assistance     General bed mobility comments: pt not able to assist   Transfers Overall transfer level: Needs assistance Equipment used: 2 person hand held assist Transfers: Sit to/from Stand Sit to Stand: Total assist;+2  physical assistance;+2 safety/equipment(unable to perform due to lethargy and weakness)         General transfer comment: Attempting sit to stand +2 with totalA. pt unable to assist- L knee immobilized and it was his strong LE. pt slumped in bed leaning to right and posteriorly.    Balance Overall balance assessment: Needs assistance;History of Falls   Sitting balance-Leahy Scale: Poor       Standing balance-Leahy Scale: Zero                             ADL either performed or assessed with clinical judgement   ADL Overall ADL's : Needs assistance/impaired Eating/Feeding: Minimal assistance   Grooming: Minimal assistance   Upper Body Bathing: Moderate assistance   Lower Body Bathing: Total assistance   Upper Body Dressing : Moderate assistance   Lower Body Dressing: Total assistance   Toilet Transfer: Total assistance   Toileting- Clothing Manipulation and Hygiene: Total assistance       Functional mobility during ADLs: +2 for physical assistance;Rolling walker(unable to perform STS; pt leaning to right and not assisting) General ADL Comments: Pt too lethargic to perform tasks; requiring modA to totalA for ADL.     Vision Baseline Vision/History: Wears glasses Wears Glasses: At all times       Perception     Praxis      Pertinent Vitals/Pain Pain Assessment: Faces Faces Pain Scale: Hurts even more Pain Location: l hip Pain Descriptors / Indicators: Moaning;Aching;Discomfort Pain Intervention(s): Limited activity within patient's tolerance;Repositioned     Hand Dominance     Extremity/Trunk Assessment Upper Extremity Assessment Upper Extremity Assessment:  Generalized weakness   Lower Extremity Assessment Lower Extremity Assessment: Generalized weakness;RLE deficits/detail;LLE deficits/detail RLE Deficits / Details: R knee is painful prior to this fall LLE Deficits / Details: immobilized s/p hip sx   Cervical / Trunk Assessment Cervical  / Trunk Assessment: Other exceptions(leaning to R side)   Communication Communication Communication: No difficulties   Cognition Arousal/Alertness: Lethargic;Suspect due to medications Behavior During Therapy: Agitated Overall Cognitive Status: Impaired/Different from baseline Area of Impairment: Orientation;Safety/judgement;Following commands;Problem solving                               General Comments: Pt requiring assist for orientation- pt asking for his "blue hat before he goes.."   General Comments  pt's spouse present for PTA info and reports pt was mobile, living home with family and normally using scooter    Exercises     Shoulder Instructions      Home Living Family/patient expects to be discharged to:: Skilled nursing facility Living Arrangements: Spouse/significant other Available Help at Discharge: Family;Available PRN/intermittently(spouse works part time) Type of Home: Mobile home Home Access: Ramped entrance(in back)     Home Layout: One level     Bathroom Shower/Tub: Other (comment)(pt does not use; sink bath )   Bathroom Toilet: Handicapped height     Home Equipment: Landscape architectlectric scooter;Transport chair;Wheelchair - manual;Toilet riser   Additional Comments: spouse works part time      Prior Functioning/Environment Level of Independence: Independent with assistive device(s)        Comments: assist with bathing hair         OT Problem List: Decreased strength;Decreased range of motion;Decreased activity tolerance;Impaired balance (sitting and/or standing);Decreased coordination;Decreased cognition;Decreased safety awareness;Pain      OT Treatment/Interventions: Self-care/ADL training;Therapeutic exercise;Neuromuscular education;Energy conservation;Therapeutic activities;Patient/family education;Balance training    OT Goals(Current goals can be found in the care plan section) Acute Rehab OT Goals Patient Stated Goal: want to go  home OT Goal Formulation: With patient Time For Goal Achievement: 11/06/18 Potential to Achieve Goals: Fair ADL Goals Pt Will Perform Grooming: with set-up;sitting Pt Will Perform Upper Body Dressing: with set-up;sitting Pt Will Perform Lower Body Dressing: with min assist;with adaptive equipment Pt Will Transfer to Toilet: with min assist;bedside commode Additional ADL Goal #1: Pt will perform ADL functional transfers with MinA  OT Frequency: Min 2X/week   Barriers to D/C: Decreased caregiver support  caregiver is elderly spouse       Co-evaluation PT/OT/SLP Co-Evaluation/Treatment: Yes Reason for Co-Treatment: Complexity of the patient's impairments (multi-system involvement);For patient/therapist safety;To address functional/ADL transfers   OT goals addressed during session: ADL's and self-care      AM-PAC OT "6 Clicks" Daily Activity     Outcome Measure Help from another person eating meals?: A Lot Help from another person taking care of personal grooming?: A Lot Help from another person toileting, which includes using toliet, bedpan, or urinal?: Total Help from another person bathing (including washing, rinsing, drying)?: Total Help from another person to put on and taking off regular upper body clothing?: Total Help from another person to put on and taking off regular lower body clothing?: Total 6 Click Score: 8   End of Session Equipment Utilized During Treatment: Gait belt;Rolling walker Nurse Communication: Mobility status  Activity Tolerance: Patient limited by lethargy;Patient limited by pain;Treatment limited secondary to agitation;Treatment limited secondary to medical complications (Comment) Patient left: in bed;with call bell/phone within reach;with bed alarm set;with family/visitor present  OT Visit Diagnosis: Unsteadiness on feet (R26.81);Muscle weakness (generalized) (M62.81);Repeated falls (R29.6)                Time: 1884-1660 OT Time Calculation (min):  41 min Charges:  OT General Charges $OT Visit: 1 Visit OT Evaluation $OT Eval Moderate Complexity: 1 Mod OT Treatments $Neuromuscular Re-education: 8-22 mins  Revonda Standard Cecil Cranker) Glendell Docker OTR/L Acute Rehabilitation Services Pager: [phone] Office: [phone]   Sandrea Hughs 10/23/2018, 2:52 PM

## 2018-10-23 NOTE — Plan of Care (Signed)
  Problem: Activity: Goal: Ability to ambulate and perform ADLs will improve Outcome: Not Progressing   Problem: Self-Concept: Goal: Ability to maintain and perform role responsibilities to the fullest extent possible will improve Outcome: Progressing   Problem: Pain Management: Goal: Pain level will decrease Outcome: Progressing

## 2018-10-23 NOTE — Anesthesia Postprocedure Evaluation (Signed)
Anesthesia Post Note  Patient: [name]  Procedure(s) Performed: ARTHROPLASTY BIPOLAR HIP (HEMIARTHROPLASTY) (Left )     Patient location during evaluation: PACU Anesthesia Type: General Level of consciousness: awake and alert Pain management: pain level controlled Vital Signs Assessment: post-procedure vital signs reviewed and stable Respiratory status: spontaneous breathing, nonlabored ventilation, respiratory function stable and patient connected to nasal cannula oxygen Cardiovascular status: blood pressure returned to baseline and stable Postop Assessment: no apparent nausea or vomiting Anesthetic complications: no    Last Vitals:  Vitals:   10/23/18 1544 10/23/18 1946  BP: 123/81 121/68  Pulse: 84 64  Resp: 18 18  Temp: 36.8 C 36.9 C  SpO2: 98% 96%    Last Pain:  Vitals:   10/23/18 1946  TempSrc: Oral  PainSc:                  Marijah Larranaga S

## 2018-10-23 NOTE — Evaluation (Signed)
Clinical/Bedside Swallow Evaluation Patient Details  Name: [name] MRN: [id number] Date of Birth: 1928-11-18  Today's Date: 10/23/2018 Time: SLP Start Time (ACUTE ONLY): 5681 SLP Stop Time (ACUTE ONLY): 0918 SLP Time Calculation (min) (ACUTE ONLY): 26 min  Past Medical History:  Past Medical History:  Diagnosis Date  . Atrial tachycardia (HCC)   . DDD (degenerative disc disease)   . DJD (degenerative joint disease)   . GERD (gastroesophageal reflux disease)   . Nocturia   . PAF (paroxysmal atrial fibrillation) (HCC)    on Coumadin  . Phlebitis   . Scoliosis   . Spinal stenosis   . Tachycardia-bradycardia syndrome (HCC)    s/p PPM by ST 04/08/10, RV lead has dislodged but  appears stable AAIR   Past Surgical History:  Past Surgical History:  Procedure Laterality Date  . INGUINAL HERNIA REPAIR  1992  . PACEMAKER INSERTION  04/08/10   [employer] - Revo - implanted by Dr. Deborah Chalk 04/08/10  RV lead has dislodged, though pt is stable AAIR   HPI:  Pt is an 85 y.o. male admitted for L femoral neck fracture after a fall when transferring to his electric scooter. PMHx GERD, pacemaker placement, afib, memory loss. Intubated for surgical repair of L hip. CXR 10/20/18 clear; large hiatal hernia noted.    Assessment / Plan / Recommendation Clinical Impression  Patient presents with oropharyngeal swallow which appears at bedside to be within functional limits with adequate airway protection. Pt is somewhat drowsy but remains alert, though he closes his eyes intermittently. He is able to self-feed regular solids, puree, and thin liquids without difficulty. Oral control, mastication and clearance adequate, swallow appears timely. No overt signs of aspiration observed despite challenging with consecutive straw sips of thin liquids in excess of 3oz. Recommend regular diet with thin liquids, meds whole with liquid or whole in puree, per pt preference. Pt has been somewhat lethargic due to pain  medications. If pt lethargic hold trays until more alert; d/w pt's wife and RN. No further skilled ST needs identified. Will s/o.   SLP Visit Diagnosis: Dysphagia, unspecified (R13.10)    Aspiration Risk  Mild aspiration risk    Diet Recommendation Regular;Thin liquid   Liquid Administration via: Cup;Straw Medication Administration: Whole meds with liquid Supervision: Patient able to self feed Compensations: Slow rate;Small sips/bites Postural Changes: Seated upright at 90 degrees    Other  Recommendations Oral Care Recommendations: Oral care BID   Follow up Recommendations Skilled Nursing facility      Frequency and Duration            Prognosis Prognosis for Safe Diet Advancement: Good      Swallow Study   General Date of Onset: 10/20/18 HPI: Pt is an 85 y.o. male admitted for L femoral neck fracture after a fall when transferring to his electric scooter. PMHx GERD, pacemaker placement, afib, memory loss. Intubated for surgical repair of L hip. CXR 10/20/18 clear; large hiatal hernia noted.  Type of Study: Bedside Swallow Evaluation Previous Swallow Assessment: none in chart Diet Prior to this Study: NPO Temperature Spikes Noted: No Respiratory Status: Room air History of Recent Intubation: Yes Length of Intubations (days): (for procedure) Date extubated: 10/22/18 Behavior/Cognition: Cooperative;Alert Oral Cavity Assessment: Within Functional Limits Oral Care Completed by SLP: Yes Oral Cavity - Dentition: Dentures, top;Dentures, bottom Vision: Functional for self-feeding Self-Feeding Abilities: Able to feed self Patient Positioning: Upright in bed Baseline Vocal Quality: Low vocal intensity Volitional Cough: Strong Volitional Swallow: Able to  elicit    Oral/Motor/Sensory Function Overall Oral Motor/Sensory Function: Within functional limits   Ice Chips Ice chips: Within functional limits Presentation: Spoon   Thin Liquid Thin Liquid: Within functional  limits Presentation: Cup;Straw;Self Fed    Nectar Thick Nectar Thick Liquid: Not tested   Honey Thick Honey Thick Liquid: Not tested   Puree Puree: Within functional limits Presentation: Self Fed;Spoon   Solid     Solid: Within functional limits Presentation: Self Fed     Rondel Baton, MS, CCC-SLP Speech-Language Pathologist Acute Rehabilitation Services Pager: [phone] Office: [phone]  Arlana Lindau 10/23/2018,9:24 AM

## 2018-10-23 NOTE — Progress Notes (Signed)
PROGRESS NOTE    ASPEN DETERDING  [name]:[id number] [name]: 1929/03/05 DOA: 10/20/2018 PCP: [name], MD  Brief Narrative:  HPI per [name] on 10/20/2018 [name] is a 85 y.o. male with medical history significant of pacemaker placement; and afib presenting with a fall.   He got out of bed and was moving into his electric scooter.  He lost his balance while transferring and fell on his side.   He uses a walker for transfers but uses his scooter most of the time.  This is due to scoliosis and decreased leg strength.  He also had a bad knee on his right leg and now injured his left leg.  No dizziness, mechanical fall only.  **Surgical intervention was canceled initally due to his atrial fibrillation with RVR.  He was given Betapace along with metoprolol IV and still has elevated heart rate so Cardiology was consulted for further evaluation recommendations patient's sotalol was stopped and he was given 0.25 mg of IV digoxin twice the day before yesteday. He underwent Surgical Intervention and has done well.  Dr. [name] spoke with the patient's son about plans for atrial fibrillation management and anticoagulation and the patient son is to talk with the family about anticoagulation and if the family wishes to pursue anticoagulation they would start apixaban over warfarin and plan is to start amiodarone 2/162020 after sotalol washout  Assessment & Plan:   Principal Problem:   Left displaced femoral neck fracture (HCC) Active Problems:   ATRIAL FIBRILLATION   Essential hypertension, benign   Chronic pain syndrome   Memory loss   Protein-calorie malnutrition, severe  Left Displaced Femoral Neck Fracture status post surgical intervention this morning -Mechanical fall resulting in hip fracture -Orthopedics consulted and repaired this morning as was unable to do yesterday because his heart rate was too elevated -Was NPO after midnight in anticipation of  surgical repair but he was currently n.p.o. again given his intubation and SLP evaluated and now has been placed on a Diet -SCDs overnight; on VTE Prophylaxis -Family to decide about Anticoagulation and if they pursue it will be placed on Axpixaban over Warfarin  -Pain control with Robxain, Vicodin, and Morphine prn as below -SW consult for rehab placement - prefers [facility] -Will need PT consult post-operatively -Hip fracture order set utilized; pain control as below -Continue with Cepacol lozenges and phenol mouth spray for sore throat after intubation -Antiemetics with Zofran 4 mg every 6 as needed nausea with po/IV Reglan 5 to 10 mg if Zofran is ineffective -Bowel regimen with docusate 100 mg p.o. twice daily  Paroxysmal A Fib, on Coumadin Atrial Tachycardia Tachycardia-Bradycardia Syndrom s/p PPM -Usually Rate controlled with pacemaker but rates Uncontrolled and running high -Has prn Sotalol for breakthrough tachycardia and takes 160 mg po BIDprn for HR>160 and this has been stopped by Cardiology allowing for washout period  -Was given 2.5 mg of Lopressor x2 without success and now had been placed on 5 mg q69minprn for HR>120 x2 doses -On Coumadin for [facility] and was given vitamin K to reverse his coagulopathy prior to surgery -Repeat INR was 1.36 this morning -Cardiology consulted for further evaluation and recommendations given his Tachycardia and mild Hypotension  -[name] evaluated and recommended giving the patient IV digoxin 0.25 mg every 6 hours x 2 doses; [name] had recommended holding sotalol and once that is cleared begin IV amiodarone to assist with rate and rhythm control; Amiodarone to start 10/24/2018 -Family to  make a decision about Anticoagulation and if they persue it will be placed on Apixaban over Warfarin  HTN but is now lower  -Takes prn Sotalol, otherwise not on BP medications routinely -BP was on the lower side and was given a bolus yesterday -Hold  Antihypertensives and have asked Cardiology to Weigh in -Now on Metoprolol 12.5 mg BID -Given 1 Liter Bolus given Hypotension this AM of 78/51 -Pressure is now improved and is 99/53  Chronic pain DJD/DDD -Continue Hydrocodone-Acetaminophen 10-325 mg q4hprn with addition of Morphine 0.5 mg q2hprn for breakthrough/severe pain -Continue with methocarbamol 5 mg p.o./IV every 6 PRN for muscle spasms  Memory Loss -Patient appeared to have some difficulty answering all questions and continues to do so -Wife confirms some question of early dementia -Outpatient workup   GERD -Continue to Monitor for now  Leukocytosis, improving -In the setting of Fall and Fracture -WBC went from 11.4 -> 11.8 -> 10.7 -> 10.8 -Continue to Monitor for S/Sx of Infection; Received Preoperative Abx already -Repeat CBC in AM  Thrombocytopenia, worsening  -In the setting of Fall; Platelet Count went from 179 -> 138 -> 124 -> 121 -Takes Coumadin at Home but this is currently being held -Continue to Monitor for S/Sx of Bleeding -Repeat CBC in AM   Hyperglycemia -Patient's Glucose level on CMP was 105 and this AM was 99 -Likely Reactive but has no HbA1c on file  -Continue to Monitor Blood Sugars carefully and if remaining elevated will place on Sensitive Novolog SSI  Hypokalemia -Patient's potassium this morning was 4.3 -Continue to replete as necessary -Repeat CMP in a.m.  Hypophosphatemia -Patient's phosphorus level this morning was 3.0 -Continue monitor replete as necessary -Repeat phosphorus level in a.m.  Hyperbilirubinemia -T bili yesterday morning was 1.6 and is now 1.8 -Likely reactive -Continue monitor and trend and repeat CMP in a.m.  Severe Malnutrition in the Context of Chronic Illness -Nutritionist consult for further evaluation recommendations -Continue Ensure Enlive p.o. 3 times daily as well as multivitamin  Macrocytic Anemia -Patient's Hb/Hct went from 13.4/42.3 -> and is now  12.1/101.3 -Check Anemia Panel in the AM -Continue to Monitor for S/Sx of Bleeding -Repeat CBC in AM   Hyponatremia -Mild at 134 -Continue to Monitor and patient received a Bolus of 1 Liter of NS this AM and is on Maintenance IVF at 50 mL/hr -Continue to Monitor and Trend and Repeat CMP in AM   DVT prophylaxis: SCDs for now and will hold his Home Coumadin; he is now on enoxaparin 40 mg subcu every 24 Code Status: FULL CODE Family Communication: Wife at bedside Disposition Plan: Pending repeat evaluation but likely SNF  Consultants:   Orthopedic Surgery Dr. Carola Frost  Cardiology    Procedures:  Hemiarthroplasty of Left Hip   Antimicrobials:  Anti-infectives (From admission, onward)   Start     Dose/Rate Route Frequency Ordered Stop   10/22/18 2000  vancomycin (VANCOCIN) IVPB 1000 mg/200 mL premix     1,000 mg 200 mL/hr over 60 Minutes Intravenous Every 12 hours 10/22/18 1213 10/22/18 2136   10/22/18 0745  vancomycin (VANCOCIN) IVPB 1000 mg/200 mL premix     1,000 mg 200 mL/hr over 60 Minutes Intravenous  Once 10/22/18 0741 10/23/18 0115   10/21/18 1300  ceFAZolin (ANCEF) IVPB 2g/100 mL premix  Status:  Discontinued     2 g 200 mL/hr over 30 Minutes Intravenous To [facility] 10/20/18 1932 10/21/18 0938   10/21/18 1015  ceFAZolin (ANCEF) IVPB 2g/100 mL premix  2 g 200 mL/hr over 30 Minutes Intravenous On call to O.R. 10/21/18 1006 10/22/18 0559   10/21/18 0800  ceFAZolin (ANCEF) IVPB 2g/100 mL premix  Status:  Discontinued     2 g 200 mL/hr over 30 Minutes [facility] 10/20/18 1924 10/20/18 1932     Subjective: Seen and examined at bedside and was about to eat breakfast and was hungry. No CP or SOB. When asked about his leg he replied "It's there." No other concerns or complaints at this time.   Objective: Vitals:   10/23/18 0516 10/23/18 0818 10/23/18 0949 10/23/18 0954  BP: 109/69 (!) 78/51 (!) 99/53   Pulse: 97 (!) 49 (!) 56 (!) 101    Resp: 18 17 16    Temp: 97.8 F (36.6 C) 98.4 F (36.9 C) 98.3 F (36.8 C)   TempSrc: Oral Oral Oral   SpO2: 95% 97% 94%   Weight:      Height:        Intake/Output Summary (Last 24 hours) at 10/23/2018 1356 Last data filed at 10/23/2018 0355 Gross per 24 hour  Intake 892.17 ml  Output 200 ml  Net 692.17 ml   Filed Weights   10/20/18 2000 10/21/18 0740 10/22/18 0731  Weight: 65.2 kg 65.2 kg 65.2 kg   Examination: Physical Exam:  Constitutional: Thin elderly Caucasian male currently no acute distress sitting up in bed about to eat his breakfast and has no complaints at this time Eyes: Lids and conjunctive are normal.  Sclera anicteric ENMT: External ears and nose appear normal.  Grossly normal hearing Neck: Appears supple no JVD Respiratory: Diminished auscultation bilaterally no appreciable wheezing, rales, rhonchi.  Patient not tachypneic wheezing and accessory muscles to breathe Cardiovascular: Tachycardic and irregularly irregular rhythm.  No extremity edema noted Abdomen: Soft, nontender, nondistended.  Bowel sounds present GU: Deferred Musculoskeletal: No contractures or cyanosis.  But her right knee has a deformity/effusion Skin: No appreciable rashes or lesions limited skin evaluation but does have chronic lower extremity venous stasis changes and does have some seborrheic keratosis noted Neurologic: Cranial nerves II through XII grossly intact no appreciable focal deficits Psychiatric:.  Normal mood and affect.  He is awake and oriented x2  Data Reviewed: I have personally reviewed following labs and imaging studies  CBC: Recent Labs  Lab 10/20/18 1039 10/21/18 0857 10/21/18 2329 10/22/18 0407 10/23/18 0619  WBC 11.4* 11.8* 11.0* 10.7* 10.8*  NEUTROABS 9.3* 9.8*  --  8.6* 8.2*  HGB 13.4 13.4 12.6* 12.6* 12.1*  HCT 42.3 39.9 37.9* 38.1* 37.6*  MCV 101.9* 100.8* 98.4 100.0 101.3*  PLT 179 138* 121* 124* 121*   Basic Metabolic Panel: Recent Labs  Lab  10/20/18 1318 10/22/18 0407 10/23/18 0619  NA 136 137 134*  K 4.0 3.3* 4.3  CL 96* 101 103  CO2 29 27 27   GLUCOSE 105* 99 95  BUN 16 20 21   CREATININE 0.72 0.74 0.79  CALCIUM 9.1 8.5* 8.0*  MG  --  1.7 1.7  PHOS  --  2.2* 3.0   GFR: Estimated Creatinine Clearance: 58.7 mL/min (by C-G formula based on SCr of 0.79 mg/dL). Liver Function Tests: Recent Labs  Lab 10/22/18 0407 10/23/18 0619  AST 26 31  ALT 15 19  ALKPHOS 42 37*  BILITOT 1.6* 1.8*  PROT 5.3* 5.1*  ALBUMIN 2.6* 2.4*   No results for input(s): LIPASE, AMYLASE in the last 168 hours. No results for input(s): AMMONIA in the last 168 hours. Coagulation Profile: Recent  Labs  Lab 10/20/18 1105 10/21/18 0248 10/22/18 0407 10/23/18 0619  INR 2.21 1.50 1.32 1.36   Cardiac Enzymes: No results for input(s): CKTOTAL, CKMB, CKMBINDEX, TROPONINI in the last 168 hours. BNP (last 3 results) No results for input(s): PROBNP in the last 8760 hours. HbA1C: No results for input(s): HGBA1C in the last 72 hours. CBG: No results for input(s): GLUCAP in the last 168 hours. Lipid Profile: No results for input(s): CHOL, HDL, LDLCALC, TRIG, CHOLHDL, LDLDIRECT in the last 72 hours. Thyroid Function Tests: No results for input(s): TSH, T4TOTAL, FREET4, T3FREE, THYROIDAB in the last 72 hours. Anemia Panel: No results for input(s): VITAMINB12, FOLATE, FERRITIN, TIBC, IRON, RETICCTPCT in the last 72 hours. Sepsis Labs: No results for input(s): PROCALCITON, LATICACIDVEN in the last 168 hours.  Recent Results (from the past 240 hour(s))  MRSA PCR Screening     Status: Abnormal   Collection Time: 10/20/18  6:54 PM  Result Value Ref Range Status   MRSA by PCR POSITIVE (A) NEGATIVE Final    Comment:        The GeneXpert MRSA Assay (FDA approved for NASAL specimens only), is one component of a comprehensive MRSA colonization surveillance program. It is not intended to diagnose MRSA infection nor to guide or monitor treatment  for MRSA infections. RESULT CALLED TO, READ BACK BY AND VERIFIED WITH: Darcella Cheshire RN 2209 10/20/18 A BROWNING Performed at [facility], [address] [place], [place] [address]      RN Pressure Injury Documentation:     Estimated body mass index is 23.2 kg/m as calculated from the following:   Height as of this encounter: 5\' 6"  (1.676 m).   Weight as of this encounter: 65.2 kg.  Malnutrition Type:  Nutrition Problem: Severe Malnutrition Etiology: chronic illness(CAD)   Malnutrition Characteristics:  Signs/Symptoms: severe fat depletion, severe muscle depletion   Nutrition Interventions:  Interventions: Ensure Enlive (each supplement provides 350kcal and 20 grams of protein), MVI    Radiology Studies: Pelvis Portable  Result Date: 10/22/2018 CLINICAL DATA:  Post op left hip bipolar hemiarthroplasty. EXAM: PORTABLE PELVIS 1-2 VIEWS COMPARISON:  Left hip radiographs 10/20/2018. FINDINGS: 1115 hours. The bones are demineralized. Patient has undergone interval left hip bipolar hemiarthroplasty. The hardware appears well positioned. No evidence of acute fracture or dislocation. There is soft tissue emphysema overlying the left greater trochanter. Mild right hip degenerative changes are present. IMPRESSION: No demonstrated complication following left hip bipolar hemiarthroplasty. Electronically Signed   By: Carey Bullocks M.D.   On: 10/22/2018 13:28   Dg Femur Port Min 2 Views Left  Result Date: 10/22/2018 CLINICAL DATA:  Left hip bipolar hemiarthroplasty. EXAM: LEFT FEMUR PORTABLE 2 VIEWS COMPARISON:  Left hip radiographs 10/20/2018. Left knee radiographs 04/21/2006. FINDINGS: Interval left hip bipolar hemiarthroplasty without complication. Remote left total knee arthroplasty appears unchanged. No evidence of acute fracture or dislocation. There is some soft tissue emphysema in the proximal thigh and around the greater trochanter. IMPRESSION: No demonstrated complication  following left hip bipolar hemiarthroplasty. Electronically Signed   By: Carey Bullocks M.D.   On: 10/22/2018 13:30    Scheduled Meds: . docusate sodium  100 mg Oral BID  . enoxaparin (LOVENOX) injection  40 mg Subcutaneous Q24H  . feeding supplement (ENSURE ENLIVE)  237 mL Oral TID BM  . metoprolol tartrate  12.5 mg Oral BID  . multivitamin with minerals  1 tablet Oral Daily  . mupirocin ointment  1 application Nasal BID   Continuous  Infusions: . sodium chloride 50 mL/hr at 10/23/18 1111  . lactated ringers Stopped (10/22/18 1017)  . lactated ringers 10 mL/hr at 10/22/18 0747  . methocarbamol (ROBAXIN) IV      LOS: 3 days   Merlene Laughter, DO [facility] PAGER is on AMION  If 7PM-7AM, please contact night-coverage [email or web address] Password [email or web address] 10/23/2018, 1:56 PM

## 2018-10-23 NOTE — Progress Notes (Signed)
Called MD to clarify weight bearing restriction for RLE, received callback from [place], [place], discontinued NWB and placed Pt on WBAT to RLE per verbal order.

## 2018-10-23 NOTE — Plan of Care (Signed)
  Problem: Education: Goal: Knowledge of General Education information will improve Description Including pain rating scale, medication(s)/side effects and non-pharmacologic comfort measures Outcome: Progressing   Problem: Clinical Measurements: Goal: Ability to maintain clinical measurements within normal limits will improve Outcome: Progressing   Problem: Education: Goal: Verbalization of understanding the information provided (i.e., activity precautions, restrictions, etc) will improve Outcome: Progressing   Problem: Activity: Goal: Ability to ambulate and perform ADLs will improve Outcome: Progressing   Problem: Clinical Measurements: Goal: Postoperative complications will be avoided or minimized Outcome: Progressing   Problem: Pain Management: Goal: Pain level will decrease Outcome: Progressing

## 2018-10-24 DIAGNOSIS — I4891 Unspecified atrial fibrillation: Secondary | ICD-10-CM

## 2018-10-24 LAB — COMPREHENSIVE METABOLIC PANEL
ALT: 18 U/L (ref 0–44)
AST: 28 U/L (ref 15–41)
Albumin: 2.2 g/dL — ABNORMAL LOW (ref 3.5–5.0)
Alkaline Phosphatase: 44 U/L (ref 38–126)
Anion gap: 7 (ref 5–15)
BUN: 22 mg/dL (ref 8–23)
CO2: 27 mmol/L (ref 22–32)
Calcium: 7.9 mg/dL — ABNORMAL LOW (ref 8.9–10.3)
Chloride: 102 mmol/L (ref 98–111)
Creatinine, Ser: 0.61 mg/dL (ref 0.61–1.24)
GFR calc Af Amer: >60 mL/min (ref >60)
GFR calc non Af Amer: >60 mL/min (ref >60)
Glucose, Bld: 107 mg/dL — ABNORMAL HIGH (ref 70–99)
POTASSIUM: 3.7 mmol/L (ref 3.5–5.1)
SODIUM: 136 mmol/L (ref 135–145)
Total Bilirubin: 1.3 mg/dL — ABNORMAL HIGH (ref 0.3–1.2)
Total Protein: 5.2 g/dL — ABNORMAL LOW (ref 6.5–8.1)

## 2018-10-24 LAB — CBC WITH DIFFERENTIAL/PLATELET
Abs Immature Granulocytes: 0.04 10*3/uL (ref 0.00–0.07)
Basophils Absolute: 0 10*3/uL (ref 0.0–0.1)
Basophils Relative: 0 %
Eosinophils Absolute: 0.1 10*3/uL (ref 0.0–0.5)
Eosinophils Relative: 1 %
HCT: 35.5 % — ABNORMAL LOW (ref 39.0–52.0)
Hemoglobin: 11.8 g/dL — ABNORMAL LOW (ref 13.0–17.0)
Immature Granulocytes: 0 %
Lymphocytes Relative: 8 %
Lymphs Abs: 0.8 10*3/uL (ref 0.7–4.0)
MCH: 33.8 pg (ref 26.0–34.0)
MCHC: 33.2 g/dL (ref 30.0–36.0)
MCV: 101.7 fL — ABNORMAL HIGH (ref 80.0–100.0)
Monocytes Absolute: 1.4 10*3/uL — ABNORMAL HIGH (ref 0.1–1.0)
Monocytes Relative: 14 %
NEUTROS PCT: 77 %
Neutro Abs: 7.4 10*3/uL (ref 1.7–7.7)
Platelets: 136 10*3/uL — ABNORMAL LOW (ref 150–400)
RBC: 3.49 MIL/uL — AB (ref 4.22–5.81)
RDW: 13.3 % (ref 11.5–15.5)
WBC: 9.7 10*3/uL (ref 4.0–10.5)
nRBC: 0 % (ref 0.0–0.2)

## 2018-10-24 LAB — PROTIME-INR
INR: 1.23
Prothrombin Time: 15.4 seconds — ABNORMAL HIGH (ref 11.4–15.2)

## 2018-10-24 LAB — MAGNESIUM: Magnesium: 1.9 mg/dL (ref 1.7–2.4)

## 2018-10-24 LAB — PHOSPHORUS: Phosphorus: 1.9 mg/dL — ABNORMAL LOW (ref 2.5–4.6)

## 2018-10-24 MED ORDER — K PHOS MONO-SOD PHOS DI & MONO 155-852-130 MG PO TABS
500.0000 mg | ORAL_TABLET | Freq: Three times a day (TID) | ORAL | Status: AC
  Administered 2018-10-24 (×2): 500 mg via ORAL
  Filled 2018-10-24 (×3): qty 2

## 2018-10-24 MED ORDER — AMIODARONE HCL IN DEXTROSE 360-4.14 MG/200ML-% IV SOLN
60.0000 mg/h | INTRAVENOUS | Status: DC
  Filled 2018-10-24: qty 200

## 2018-10-24 MED ORDER — AMIODARONE HCL 100 MG PO TABS
400.0000 mg | ORAL_TABLET | Freq: Two times a day (BID) | ORAL | Status: DC
  Administered 2018-10-24: 400 mg via ORAL
  Filled 2018-10-24: qty 4

## 2018-10-24 MED ORDER — APIXABAN 5 MG PO TABS
5.0000 mg | ORAL_TABLET | Freq: Two times a day (BID) | ORAL | Status: DC
  Administered 2018-10-24 – 2018-10-30 (×12): 5 mg via ORAL
  Filled 2018-10-24: qty 1
  Filled 2018-10-24: qty 2
  Filled 2018-10-24 (×10): qty 1

## 2018-10-24 MED ORDER — AMIODARONE HCL IN DEXTROSE 360-4.14 MG/200ML-% IV SOLN
30.0000 mg/h | INTRAVENOUS | Status: DC
  Administered 2018-10-25 – 2018-10-28 (×7): 30 mg/h via INTRAVENOUS
  Filled 2018-10-24 (×9): qty 200

## 2018-10-24 MED ORDER — AMIODARONE LOAD VIA INFUSION
150.0000 mg | Freq: Once | INTRAVENOUS | Status: AC
  Administered 2018-10-24: 150 mg via INTRAVENOUS
  Filled 2018-10-24: qty 83.34

## 2018-10-24 MED ORDER — AMIODARONE HCL IN DEXTROSE 360-4.14 MG/200ML-% IV SOLN
30.0000 mg/h | INTRAVENOUS | Status: DC
  Filled 2018-10-24: qty 200

## 2018-10-24 MED ORDER — METOPROLOL TARTRATE 5 MG/5ML IV SOLN
5.0000 mg | INTRAVENOUS | Status: AC | PRN
  Administered 2018-10-24 (×2): 5 mg via INTRAVENOUS
  Filled 2018-10-24 (×2): qty 5

## 2018-10-24 MED ORDER — AMIODARONE LOAD VIA INFUSION
150.0000 mg | Freq: Once | INTRAVENOUS | Status: DC
  Filled 2018-10-24: qty 83.34

## 2018-10-24 MED ORDER — AMIODARONE HCL IN DEXTROSE 360-4.14 MG/200ML-% IV SOLN
60.0000 mg/h | INTRAVENOUS | Status: AC
  Administered 2018-10-24 (×2): 60 mg/h via INTRAVENOUS
  Filled 2018-10-24: qty 200

## 2018-10-24 NOTE — Progress Notes (Signed)
PROGRESS NOTE    [name]  [name]:[id number] [name]: 1929/06/24 DOA: 10/20/2018 PCP: [name], MD  Brief Narrative:  HPI per [name] on 10/20/2018 [name] is a 85 y.o. male with medical history significant of pacemaker placement; and afib presenting with a fall.   He got out of bed and was moving into his electric scooter.  He lost his balance while transferring and fell on his side.   He uses a walker for transfers but uses his scooter most of the time.  This is due to scoliosis and decreased leg strength.  He also had a bad knee on his right leg and now injured his left leg.  No dizziness, mechanical fall only.  **Surgical intervention was canceled initally due to his atrial fibrillation with RVR.  He was given Betapace along with metoprolol IV and still has elevated heart rate so Cardiology was consulted for further evaluation recommendations patient's sotalol was stopped and he was given 0.25 mg of IV digoxin twice. He underwent Surgical Intervention and has done well.  Dr. [name] spoke with the patient's son about plans for atrial fibrillation management and anticoagulation and the patient son is to talk with the family about anticoagulation plan is to start apixaban over warfarin and along with Amiodarone 10/24/2018 after sotalol washout  Assessment & Plan:   Principal Problem:   Left displaced femoral neck fracture (HCC) Active Problems:   ATRIAL FIBRILLATION   Essential hypertension, benign   Chronic pain syndrome   Memory loss   Protein-calorie malnutrition, severe  Left Displaced Femoral Neck Fracture status post surgical intervention POD2 -Mechanical fall resulting in hip fracture -Orthopedics consulted and repaired this morning as was unable to do yesterday because his heart rate was too elevated -Was NPO after midnight in anticipation of surgical repair but he was currently n.p.o. again given his intubation and SLP evaluated and  now has been placed on a Diet -SCDs overnight; on VTE Prophylaxis but this is to be changed to Apixaban after discussion with Cardiology  -Family to decided about Anticoagulation and will be placed on Axpixaban over Warfarin  -Pain control with Robxain, Vicodin, and Morphine prn as below -SW consult for rehab placement - prefers [facility] -Will need PT consult post-operatively -Hip fracture order set utilized; pain control as below -Continue with Cepacol lozenges and phenol mouth spray for sore throat after intubation -Antiemetics with Zofran 4 mg every 6 as needed nausea with po/IV Reglan 5 to 10 mg if Zofran is ineffective -Bowel regimen with docusate 100 mg p.o. twice daily  Paroxysmal A Fib, on Coumadin Atrial Tachycardia Tachycardia-Bradycardia Syndrom s/p PPM -Usually Rate controlled with pacemaker but rates Uncontrolled and running high -Has prn Sotalol for breakthrough tachycardia and takes 160 mg po BIDprn for HR>120 and this has been stopped by Cardiology allowing for washout period  -Was given 2.5 mg of Lopressor x2 without success and now had been placed on 5 mg q47minprn for HR>120 x2 doses and given these doses last night   -Was on Coumadin for [facility] and was given vitamin K to reverse his coagulopathy prior to surgery -Repeat INR was 1.23 this morning -Cardiology consulted for further evaluation and recommendations given his Tachycardia and mild Hypotension  -[name] had recommended holding sotalol and once that is cleared begin IV amiodarone to assist with rate and rhythm control; Amiodarone to start 10/24/2018 but was started po 400 mg BID  -Family to decided about Anticoagulation and will be  placed on Axpixaban over Warfarin and Apixaban 5 mg po BID to start tonight   HTN but is now lower  -Takes prn Sotalol, otherwise not on BP medications routinely -BP was on the lower side and was given a bolus yesterday -Hold Antihypertensives and have asked Cardiology to Weigh  in -Now on Metoprolol 12.5 mg BID per Cardiology -Given 1 Liter Bolus given Hypotension yesterday morning. Continuing Maintenance IVF with NS at 50 mL/hr  Chronic Pain DJD/DDD -Continue Hydrocodone-Acetaminophen 10-325 mg q4hprn with addition of Morphine 0.5 mg q2hprn for breakthrough/severe pain -Continue with methocarbamol 5 mg p.o./IV every 6 PRN for muscle spasms  Memory Loss -Patient appeared to have some difficulty answering all questions and continues to do so -Wife confirms some question of early dementia -Outpatient workup   GERD -Continue to Monitor for now  Leukocytosis, improving -In the setting of Fall and Fracture -WBC went from 11.4 -> 11.8 -> 10.7 -> 10.8 -Continue to Monitor for S/Sx of Infection; Received Preoperative Abx already -Repeat CBC in AM  Thrombocytopenia, worsening  -In the setting of Fall; Platelet Count went from 179 -> 138 -> 124 -> 121 -> 136 -Takes Coumadin at Home but this is currently being held -Continue to Monitor for S/Sx of Bleeding now that he will be initiated on Apixaban  -Repeat CBC in AM   Hyperglycemia -Patient's Glucose level on BMP/CMP ranging from 95-107 -Likely Reactive but has no HbA1c on file  -Continue to Monitor Blood Sugars carefully and if remaining elevated will place on Sensitive Novolog SSI  Hypokalemia -Patient's potassium this morning was 3.7; Goal is 4 or Greater -Replete with po K Phos Neutral 500 mg TID x3 doses -Continue to replete as necessary -Repeat CMP in a.m.  Hypophosphatemia -Patient's phosphorus level this morning was 1.9 -Replete with po K Phos Neutral 500 mg TID x3 doses -Continue monitor replete as necessary -Repeat phosphorus level in a.m.  Hyperbilirubinemia -T bili went from 1.6 -> 1.8 -> 1.3 -Likely reactive -Continue monitor and trend and repeat CMP in a.m.  Severe Malnutrition in the Context of Chronic Illness -Nutritionist consult for further evaluation recommendations -Continue  Ensure Enlive p.o. 3 times daily as well as multivitamin  Macrocytic Anemia -Patient's Hb/Hct went from 13.4/42.3 on admission -> 11.8/35.5 -Check Anemia Panel in the AM -Continue to Monitor for S/Sx of Bleeding -Repeat CBC in AM   Hyponatremia -Mild at 134 and improved to 136 -Continue to Monitor and patient received a Bolus of 1 Liter of NS yesterday AM and is on Maintenance IVF at 50 mL/hr; Will continue Maintenance IVF for now and will  -Continue to Monitor and Trend and Repeat CMP in AM   DVT prophylaxis: SCDs for now and now will be anticoagulated with Apixaban 5 mg po BID Code Status: FULL CODE Family Communication: Wife at bedside Disposition Plan: Pending repeat evaluation but likely SNF  Consultants:   Orthopedic Surgery Dr. Carola Frost  Cardiology    Procedures:  Hemiarthroplasty of Left Hip   Antimicrobials:  Anti-infectives (From admission, onward)   Start     Dose/Rate Route Frequency Ordered Stop   10/22/18 2000  vancomycin (VANCOCIN) IVPB 1000 mg/200 mL premix     1,000 mg 200 mL/hr over 60 Minutes Intravenous Every 12 hours 10/22/18 1213 10/22/18 2136   10/22/18 0745  vancomycin (VANCOCIN) IVPB 1000 mg/200 mL premix     1,000 mg 200 mL/hr over 60 Minutes Intravenous  Once 10/22/18 0741 10/23/18 0115   10/21/18 1300  ceFAZolin (ANCEF)  IVPB 2g/100 mL premix  Status:  Discontinued     2 g 200 mL/hr over 30 Minutes Intravenous To [facility] 10/20/18 1932 10/21/18 0938   10/21/18 1015  ceFAZolin (ANCEF) IVPB 2g/100 mL premix     2 g 200 mL/hr over 30 Minutes Intravenous On call to O.R. 10/21/18 1006 10/22/18 0559   10/21/18 0800  ceFAZolin (ANCEF) IVPB 2g/100 mL premix  Status:  Discontinued     2 g 200 mL/hr over 30 Minutes Intravenous To [facility] 10/20/18 1924 10/20/18 1932     Subjective: Seen and examined at bedside and a little confused but had no complaints.  No nausea or vomiting.  Denies any chest pain, lightheadedness or dizziness.   States that he did not have very much hip pain.  Wife at bedside and discussed with cardiology and they will anticoagulate the patient apixaban.  Objective: Vitals:   10/24/18 0341 10/24/18 0443 10/24/18 0450 10/24/18 0832  BP: 117/77 123/77 (!) 104/56 (!) 96/57  Pulse: 89   (!) 107  Resp:      Temp: 98.6 F (37 C)   98.6 F (37 C)  TempSrc: Oral   Oral  SpO2: 97%   96%  Weight:      Height:        Intake/Output Summary (Last 24 hours) at 10/24/2018 1317 Last data filed at 10/24/2018 0800 Gross per 24 hour  Intake 964.08 ml  Output 425 ml  Net 539.08 ml   Filed Weights   10/20/18 2000 10/21/18 0740 10/22/18 0731  Weight: 65.2 kg 65.2 kg 65.2 kg   Examination: Physical Exam:  Constitutional: Thin elderly Caucasian male currently no acute distress who is resting in bed and had no complaints at this time.  He is slightly confused this morning Eyes: Lids and conjunctive are normal.  Sclera anicteric ENMT: External ears nose appear normal.  Grossly normal hearing Neck: Supple no JVD Respiratory: Diminished auscultation bilaterally no appreciable wheezing or rhonchi.  Patient not tachypneic wheezing and accessory muscle breathe Cardiovascular: Cardiac rate and irregularly irregular rhythm. Trace extremity edema noted Abdomen: Soft, nontender, nondistended.  Bowel sounds present GU: Deferred Musculoskeletal: No contractures or cyanosis but right knee has a deformity and fusion from OA and left leg is in an immobilizer Skin: Skin is warm and dry no appreciable rashes but does have some chronic seborrheic keratosis noted on his head and chronic lower extremity venous stasis changes Neurologic: Cranial nerves II through XII grossly intact no appreciable focal deficits Psychiatric: Normal mood and affect.  He is slightly confused today and not oriented.  Data Reviewed: I have personally reviewed following labs and imaging studies  CBC: Recent Labs  Lab 10/20/18 1039 10/21/18 0857  10/21/18 2329 10/22/18 0407 10/23/18 0619 10/24/18 0441  WBC 11.4* 11.8* 11.0* 10.7* 10.8* 9.7  NEUTROABS 9.3* 9.8*  --  8.6* 8.2* 7.4  HGB 13.4 13.4 12.6* 12.6* 12.1* 11.8*  HCT 42.3 39.9 37.9* 38.1* 37.6* 35.5*  MCV 101.9* 100.8* 98.4 100.0 101.3* 101.7*  PLT 179 138* 121* 124* 121* 136*   Basic Metabolic Panel: Recent Labs  Lab 10/20/18 1318 10/22/18 0407 10/23/18 0619 10/24/18 0441  NA 136 137 134* 136  K 4.0 3.3* 4.3 3.7  CL 96* 101 103 102  CO2 29 27 27 27   GLUCOSE 105* 99 95 107*  BUN 16 20 21 22   CREATININE 0.72 0.74 0.79 0.61  CALCIUM 9.1 8.5* 8.0* 7.9*  MG  --  1.7 1.7 1.9  PHOS  --  2.2* 3.0 1.9*   GFR: Estimated Creatinine Clearance: 58.7 mL/min (by C-G formula based on SCr of 0.61 mg/dL). Liver Function Tests: Recent Labs  Lab 10/22/18 0407 10/23/18 0619 10/24/18 0441  AST 26 31 28   ALT 15 19 18   ALKPHOS 42 37* 44  BILITOT 1.6* 1.8* 1.3*  PROT 5.3* 5.1* 5.2*  ALBUMIN 2.6* 2.4* 2.2*   No results for input(s): LIPASE, AMYLASE in the last 168 hours. No results for input(s): AMMONIA in the last 168 hours. Coagulation Profile: Recent Labs  Lab 10/20/18 1105 10/21/18 0248 10/22/18 0407 10/23/18 0619 10/24/18 0441  INR 2.21 1.50 1.32 1.36 1.23   Cardiac Enzymes: No results for input(s): CKTOTAL, CKMB, CKMBINDEX, TROPONINI in the last 168 hours. BNP (last 3 results) No results for input(s): PROBNP in the last 8760 hours. HbA1C: No results for input(s): HGBA1C in the last 72 hours. CBG: No results for input(s): GLUCAP in the last 168 hours. Lipid Profile: No results for input(s): CHOL, HDL, LDLCALC, TRIG, CHOLHDL, LDLDIRECT in the last 72 hours. Thyroid Function Tests: No results for input(s): TSH, T4TOTAL, FREET4, T3FREE, THYROIDAB in the last 72 hours. Anemia Panel: No results for input(s): VITAMINB12, FOLATE, FERRITIN, TIBC, IRON, RETICCTPCT in the last 72 hours. Sepsis Labs: No results for input(s): PROCALCITON, LATICACIDVEN in the last  168 hours.  Recent Results (from the past 240 hour(s))  MRSA PCR Screening     Status: Abnormal   Collection Time: 10/20/18  6:54 PM  Result Value Ref Range Status   MRSA by PCR POSITIVE (A) NEGATIVE Final    Comment:        The GeneXpert MRSA Assay (FDA approved for NASAL specimens only), is one component of a comprehensive MRSA colonization surveillance program. It is not intended to diagnose MRSA infection nor to guide or monitor treatment for MRSA infections. RESULT CALLED TO, READ BACK BY AND VERIFIED WITH: Darcella Cheshire RN 2209 10/20/18 A BROWNING Performed at [facility], [address] [place], [place] [address]      RN Pressure Injury Documentation:     Estimated body mass index is 23.2 kg/m as calculated from the following:   Height as of this encounter: 5\' 6"  (1.676 m).   Weight as of this encounter: 65.2 kg.  Malnutrition Type:  Nutrition Problem: Severe Malnutrition Etiology: chronic illness(CAD)   Malnutrition Characteristics:  Signs/Symptoms: severe fat depletion, severe muscle depletion   Nutrition Interventions:  Interventions: Ensure Enlive (each supplement provides 350kcal and 20 grams of protein), MVI    Radiology Studies: No results found.  Scheduled Meds: . amiodarone  400 mg Oral BID  . apixaban  5 mg Oral BID  . docusate sodium  100 mg Oral BID  . feeding supplement (ENSURE ENLIVE)  237 mL Oral TID BM  . metoprolol tartrate  12.5 mg Oral BID  . multivitamin with minerals  1 tablet Oral Daily  . mupirocin ointment  1 application Nasal BID  . phosphorus  500 mg Oral TID   Continuous Infusions: . sodium chloride 50 mL/hr at 10/24/18 0843  . lactated ringers Stopped (10/22/18 1017)  . lactated ringers 10 mL/hr at 10/22/18 0747  . methocarbamol (ROBAXIN) IV      LOS: 4 days   Merlene Laughter, DO [facility] PAGER is on AMION  If 7PM-7AM, please contact night-coverage [email or web address] Password [email or web address] 10/24/2018, 1:17  PM

## 2018-10-24 NOTE — Progress Notes (Signed)
Rounded on Pt, alert, denies chest pain, no SOB, resting comfortably on bed, wife at bedside. Will continue to monitor.

## 2018-10-24 NOTE — Plan of Care (Signed)
  Problem: Education: Goal: Knowledge of General Education information will improve Description Including pain rating scale, medication(s)/side effects and non-pharmacologic comfort measures Outcome: Progressing   Problem: Clinical Measurements: Goal: Ability to maintain clinical measurements within normal limits will improve Outcome: Progressing   Problem: Education: Goal: Verbalization of understanding the information provided (i.e., activity precautions, restrictions, etc) will improve Outcome: Progressing   Problem: Activity: Goal: Ability to ambulate and perform ADLs will improve Outcome: Progressing

## 2018-10-24 NOTE — Progress Notes (Addendum)
Received via pt bed to 6E07. HR range 120's to 140's in Afib. Orders received for Amio bolus and gtt to be started. Denies pain, SOB or other C/O. Wife at bedside.

## 2018-10-24 NOTE — Progress Notes (Signed)
MD notified of Pt's status, received new order for PO Amiodarone.

## 2018-10-24 NOTE — Progress Notes (Signed)
    Subjective: Denies hip pain.  He has chronic right knee pain from bad OA.  Tolerating diet.  Urinating.  Early mobilization with therapy.  Objective:   VITALS:   Vitals:   10/24/18 0341 10/24/18 0443 10/24/18 0450 10/24/18 0832  BP: 117/77 123/77 (!) 104/56 (!) 96/57  Pulse: 89   (!) 107  Resp:      Temp: 98.6 F (37 C)   98.6 F (37 C)  TempSrc: Oral   Oral  SpO2: 97%   96%  Weight:      Height:       CBC Latest Ref Rng & Units 10/24/2018 10/23/2018 10/22/2018  WBC 4.0 - 10.5 K/uL 9.7 10.8(H) 10.7(H)  Hemoglobin 13.0 - 17.0 g/dL 11.8(L) 12.1(L) 12.6(L)  Hematocrit 39.0 - 52.0 % 35.5(L) 37.6(L) 38.1(L)  Platelets 150 - 400 K/uL 136(L) 121(L) 124(L)   BMP Latest Ref Rng & Units 10/24/2018 10/23/2018 10/22/2018  Glucose 70 - 99 mg/dL 847(U) 95 99  BUN 8 - 23 mg/dL 22 21 20   Creatinine 0.61 - 1.24 mg/dL 0.72 1.82 8.83  BUN/Creat Ratio 10 - 24 - - -  Sodium 135 - 145 mmol/L 136 134(L) 137  Potassium 3.5 - 5.1 mmol/L 3.7 4.3 3.3(L)  Chloride 98 - 111 mmol/L 102 103 101  CO2 22 - 32 mmol/L 27 27 27   Calcium 8.9 - 10.3 mg/dL 7.9(L) 8.0(L) 8.5(L)   Intake/Output      02/15 0701 - 02/16 0700 02/16 0701 - 02/17 0700   P.O.  360   I.V. (mL/kg) 604.1 (9.3)    IV Piggyback     Total Intake(mL/kg) 604.1 (9.3) 360 (5.5)   Urine (mL/kg/hr) 425 (0.3)    Blood     Total Output 425    Net +179.1 +360           Physical Exam: General: NAD.  Supine in bed.  Answers questions appropriately and interactive with exam.  Wife and nursing team at bedside.  No increased work of breathing. MSK LLE: Knee immobilizer in place to aid with posterior hip precautions. Dressing clean dry and intact with very small amount of bloody drainage. Sensation intact distally.  EHL, FHL, dorsiflexion, plantarflexion intact. RLE: Right knee pain with range of motion.  Mild to moderate effusion.  No sign of infection.  Assessment: 2 Days Post-Op  S/P Procedure(s) (LRB): ARTHROPLASTY BIPOLAR HIP  (HEMIARTHROPLASTY) (Left) by Dr. Carola Frost on 10/22/2018  Principal Problem:   Left displaced femoral neck fracture (HCC) Active Problems:   ATRIAL FIBRILLATION   Essential hypertension, benign   Chronic pain syndrome   Memory loss   Protein-calorie malnutrition, severe   Closed left femoral neck fracture, status post hemiarthroplasty Stable from an orthopedic perspective Minimal hip pain Eating, drinking, and voiding Early mobilization- SNF recommended by PT  Plan: Up with therapy Incentive Spirometry Apply ice PRN OTS will resume care 10/25/2018  Weightbearing: WBAT LLE.  Posterior precautions Insicional and dressing care: Dressings left intact.  OTS may change tomorrow. Orthopedic device(s): Knee immobilizer to aid with posterior hip precautions when in bed Showering: Keep dressing dry VTE prophylaxis: Eliquis, SCDs, ambulation Pain control: Minimize narcotics  Dispo: Skilled Nursing Facility/Rehab planned.  DC when ready medically and placement secured.   Lucretia Kern Martensen III, PA-C 10/24/2018, 11:23 AM

## 2018-10-24 NOTE — Progress Notes (Signed)
Progress Note  Patient Name: [name] Date of Encounter: 10/24/2018  Primary Cardiologist: Dr. [name]  Subjective   Resting comfortably in bed today, no complaints. Wife at bedside, discussed plan to transition medications, reviewed below.  Inpatient Medications    Scheduled Meds: . docusate sodium  100 mg Oral BID  . enoxaparin (LOVENOX) injection  40 mg Subcutaneous Q24H  . feeding supplement (ENSURE ENLIVE)  237 mL Oral TID BM  . metoprolol tartrate  12.5 mg Oral BID  . multivitamin with minerals  1 tablet Oral Daily  . mupirocin ointment  1 application Nasal BID  . phosphorus  500 mg Oral TID   Continuous Infusions: . sodium chloride 50 mL/hr at 10/24/18 0843  . lactated ringers Stopped (10/22/18 1017)  . lactated ringers 10 mL/hr at 10/22/18 0747  . methocarbamol (ROBAXIN) IV     PRN Meds: HYDROcodone-acetaminophen, menthol-cetylpyridinium **OR** phenol, methocarbamol **OR** methocarbamol (ROBAXIN) IV, metoCLOPramide **OR** metoCLOPramide (REGLAN) injection, morphine injection, ondansetron (ZOFRAN) IV   Vital Signs    Vitals:   10/24/18 0341 10/24/18 0443 10/24/18 0450 10/24/18 0832  BP: 117/77 123/77 (!) 104/56 (!) 96/57  Pulse: 89   (!) 107  Resp:      Temp: 98.6 F (37 C)   98.6 F (37 C)  TempSrc: Oral   Oral  SpO2: 97%   96%  Weight:      Height:        Intake/Output Summary (Last 24 hours) at 10/24/2018 1102 Last data filed at 10/24/2018 0800 Gross per 24 hour  Intake 964.08 ml  Output 425 ml  Net 539.08 ml   Last 3 Weights 10/22/2018 10/21/2018 10/20/2018  Weight (lbs) 143 lb 11.8 oz 143 lb 11.8 oz 143 lb 11.8 oz  Weight (kg) 65.2 kg 65.2 kg 65.2 kg      Telemetry    Atrial fibrillation, around 100 bpm at rest but up to 140s with movement - Personally Reviewed  ECG    No new since 2/12 - Personally Reviewed  Physical Exam   GEN: No acute distress.  Frail appearing, resting comfortably in bed Neck: No JVD Cardiac: irregular S1  and S2, no murmurs, rubs, or gallops.  Respiratory: Clear to auscultation bilaterally. GI: Soft, nontender, non-distended  MS: No edema; No deformity. Neuro:  Nonfocal  Psych: Normal affect   Labs    Chemistry Recent Labs  Lab 10/22/18 0407 10/23/18 0619 10/24/18 0441  NA 137 134* 136  K 3.3* 4.3 3.7  CL 101 103 102  CO2 27 27 27   GLUCOSE 99 95 107*  BUN 20 21 22   CREATININE 0.74 0.79 0.61  CALCIUM 8.5* 8.0* 7.9*  PROT 5.3* 5.1* 5.2*  ALBUMIN 2.6* 2.4* 2.2*  AST 26 31 28   ALT 15 19 18   ALKPHOS 42 37* 44  BILITOT 1.6* 1.8* 1.3*  GFRNONAA >60 >60 >60  GFRAA >60 >60 >60  ANIONGAP 9 4* 7     Hematology Recent Labs  Lab 10/22/18 0407 10/23/18 0619 10/24/18 0441  WBC 10.7* 10.8* 9.7  RBC 3.81* 3.71* 3.49*  HGB 12.6* 12.1* 11.8*  HCT 38.1* 37.6* 35.5*  MCV 100.0 101.3* 101.7*  MCH 33.1 32.6 33.8  MCHC 33.1 32.2 33.2  RDW 13.7 13.3 13.3  PLT 124* 121* 136*    Cardiac EnzymesNo results for input(s): TROPONINI in the last 168 hours. No results for input(s): TROPIPOC in the last 168 hours.   BNPNo results for input(s): BNP, PROBNP in the last 168 hours.  DDimer No results for input(s): DDIMER in the last 168 hours.   Radiology    Pelvis Portable  Result Date: 10/22/2018 CLINICAL DATA:  Post op left hip bipolar hemiarthroplasty. EXAM: PORTABLE PELVIS 1-2 VIEWS COMPARISON:  Left hip radiographs 10/20/2018. FINDINGS: 1115 hours. The bones are demineralized. Patient has undergone interval left hip bipolar hemiarthroplasty. The hardware appears well positioned. No evidence of acute fracture or dislocation. There is soft tissue emphysema overlying the left greater trochanter. Mild right hip degenerative changes are present. IMPRESSION: No demonstrated complication following left hip bipolar hemiarthroplasty. Electronically Signed   By: Carey Bullocks M.D.   On: 10/22/2018 13:28   Dg Femur Port Min 2 Views Left  Result Date: 10/22/2018 CLINICAL DATA:  Left hip bipolar  hemiarthroplasty. EXAM: LEFT FEMUR PORTABLE 2 VIEWS COMPARISON:  Left hip radiographs 10/20/2018. Left knee radiographs 04/21/2006. FINDINGS: Interval left hip bipolar hemiarthroplasty without complication. Remote left total knee arthroplasty appears unchanged. No evidence of acute fracture or dislocation. There is some soft tissue emphysema in the proximal thigh and around the greater trochanter. IMPRESSION: No demonstrated complication following left hip bipolar hemiarthroplasty. Electronically Signed   By: Carey Bullocks M.D.   On: 10/22/2018 13:30    Cardiac Studies   Echo from 2011 reviewed  Patient Profile     85 y.o. male with a hx of paroxysmal atrial fibrillation, atrial tachycardia, prior pacemakerwho was seen for preoperative evaluation prior to repair of hip fracture and paroxysmal atrial fibrillation. Pt underwent hip surgery on 10/22/18, now seen for postoperative management of atrial fibrillation with RVR  Assessment & Plan    Paroxysmal atrial fibrillation: now with RVR post op with activity. -maintained on sotalol as an outpatient.  AFib burden on pacemaker interrogation 09/07/2018 was 38%. Given this, plan was to change rhythm control strategy. He had a sotalol washout for >48 hours, rechecking ECG for QTc. -not controlled on metoprolol, so would prefer to load amiodarone IV for rate improvement, then will change to oral loading -CHA2DS2/VAS Stroke Risk Score of 2 for age only. Discussed with wife. They do not have prescription insurance coverage, but she also notes that it is difficult for them to travel for frequent INR checks. Will start apixaban this evening to see that he tolerates. Recommend talking to case management/social worker to see if he can get in the pharmacy assistance program for apixaban before discharge. This would be a better option than warfarin given the warfarin-amiodarone interaction and the lower bleeding risk profile/better stroke protection of  apixaban. -would aim for K >4, Mg >2.  Prior pacemaker -Followed by Dr. [name].   Left femoral neck fracture -Management per orthopedics.  Arthroplasty of the hip 2/14.  TIME SPENT WITH PATIENT: 25 minutes of direct patient care. More than 50% of that time was spent on coordination of care and counseling regarding changes to medication therapy.  [name], MD, PhD [facility]  For questions or updates, please contact [facility] Please consult [email or web address] for contact info under     Signed, [name], MD  10/24/2018, 11:02 AM

## 2018-10-24 NOTE — Progress Notes (Signed)
Patient's HR to 90-100's now after 2 doses of lopressor 5mg  IV were given. Pt resting comfortably. BP 104/56. Will continue to monitor.

## 2018-10-24 NOTE — Progress Notes (Signed)
On call [facility] notified for pt's HR running 140's-160's on tele. Awaiting response. Pt asymptomatic and resting as of now.

## 2018-10-24 NOTE — Progress Notes (Signed)
Pt alert, denies pain, no SOB, wife at bedside, called report to [place], [place], transferred to [phone] with belongings.

## 2018-10-24 NOTE — Progress Notes (Signed)
  Amiodarone Drug - Drug Interaction Consult Note  Recommendations: Monitor for bradycardia while on Amiodarone and Lopressor  Amiodarone is metabolized by the cytochrome P450 system and therefore has the potential to cause many drug interactions. Amiodarone has an average plasma half-life of 50 days (range 20 to 100 days).   There is potential for drug interactions to occur several weeks or months after stopping treatment and the onset of drug interactions may be slow after initiating amiodarone.   []  Statins: Increased risk of myopathy. Simvastatin- restrict dose to 20mg  daily. Other statins: counsel patients to report any muscle pain or weakness immediately.  []  Anticoagulants: Amiodarone can increase anticoagulant effect. Consider warfarin dose reduction. Patients should be monitored closely and the dose of anticoagulant altered accordingly, remembering that amiodarone levels take several weeks to stabilize.  []  Antiepileptics: Amiodarone can increase plasma concentration of phenytoin, the dose should be reduced. Note that small changes in phenytoin dose can result in large changes in levels. Monitor patient and counsel on signs of toxicity.  [x]  Beta blockers: increased risk of bradycardia, AV block and myocardial depression. Sotalol - avoid concomitant use.  []   Calcium channel blockers (diltiazem and verapamil): increased risk of bradycardia, AV block and myocardial depression.  []   Cyclosporine: Amiodarone increases levels of cyclosporine. Reduced dose of cyclosporine is recommended.  []  Digoxin dose should be halved when amiodarone is started.  []  Diuretics: increased risk of cardiotoxicity if hypokalemia occurs.  []  Oral hypoglycemic agents (glyburide, glipizide, glimepiride): increased risk of hypoglycemia. Patient's glucose levels should be monitored closely when initiating amiodarone therapy.   []  Drugs that prolong the QT interval:  Torsades de pointes risk may be increased  with concurrent use - avoid if possible.  Monitor QTc, also keep magnesium/potassium WNL if concurrent therapy can't be avoided. Marland Kitchen Antibiotics: e.g. fluoroquinolones, erythromycin. . Antiarrhythmics: e.g. quinidine, procainamide, disopyramide, sotalol. . Antipsychotics: e.g. phenothiazines, haloperidol.  . Lithium, tricyclic antidepressants, and methadone. Thank You,  Fayne Norrie  10/24/2018 3:54 PM

## 2018-10-24 NOTE — Progress Notes (Signed)
  Amiodarone Drug - Drug Interaction Consult Note  Recommendations: No longer an issue with coumadin since it's being transitioned to apixaban  Amiodarone is metabolized by the cytochrome P450 system and therefore has the potential to cause many drug interactions. Amiodarone has an average plasma half-life of 50 days (range 20 to 100 days).   There is potential for drug interactions to occur several weeks or months after stopping treatment and the onset of drug interactions may be slow after initiating amiodarone.   []  Statins: Increased risk of myopathy. Simvastatin- restrict dose to 20mg  daily. Other statins: counsel patients to report any muscle pain or weakness immediately.  [x]  Anticoagulants: Amiodarone can increase anticoagulant effect. Consider warfarin dose reduction. Patients should be monitored closely and the dose of anticoagulant altered accordingly, remembering that amiodarone levels take several weeks to stabilize.  []  Antiepileptics: Amiodarone can increase plasma concentration of phenytoin, the dose should be reduced. Note that small changes in phenytoin dose can result in large changes in levels. Monitor patient and counsel on signs of toxicity.  [x]  Beta blockers: increased risk of bradycardia, AV block and myocardial depression. Sotalol - avoid concomitant use.  []   Calcium channel blockers (diltiazem and verapamil): increased risk of bradycardia, AV block and myocardial depression.  []   Cyclosporine: Amiodarone increases levels of cyclosporine. Reduced dose of cyclosporine is recommended.  []  Digoxin dose should be halved when amiodarone is started.  []  Diuretics: increased risk of cardiotoxicity if hypokalemia occurs.  []  Oral hypoglycemic agents (glyburide, glipizide, glimepiride): increased risk of hypoglycemia. Patient's glucose levels should be monitored closely when initiating amiodarone therapy.   []  Drugs that prolong the QT interval:  Torsades de pointes risk  may be increased with concurrent use - avoid if possible.  Monitor QTc, also keep magnesium/potassium WNL if concurrent therapy can't be avoided. Marland Kitchen Antibiotics: e.g. fluoroquinolones, erythromycin. . Antiarrhythmics: e.g. quinidine, procainamide, disopyramide, sotalol. . Antipsychotics: e.g. phenothiazines, haloperidol.  . Lithium, tricyclic antidepressants, and methadone.   Ulyses Southward, PharmD, BCIDP, AAHIVP, CPP Infectious Disease Pharmacist 10/24/2018 11:45 AM

## 2018-10-25 ENCOUNTER — Inpatient Hospital Stay (HOSPITAL_COMMUNITY): Payer: Medicare Other

## 2018-10-25 ENCOUNTER — Encounter (HOSPITAL_COMMUNITY): Payer: Self-pay | Admitting: Orthopedic Surgery

## 2018-10-25 DIAGNOSIS — L8990 Pressure ulcer of unspecified site, unspecified stage: Secondary | ICD-10-CM

## 2018-10-25 DIAGNOSIS — Z9189 Other specified personal risk factors, not elsewhere classified: Secondary | ICD-10-CM

## 2018-10-25 LAB — RETICULOCYTES
Immature Retic Fract: 15.5 % (ref 2.3–15.9)
RBC.: 3.15 MIL/uL — ABNORMAL LOW (ref 4.22–5.81)
RETIC CT PCT: 2.3 % (ref 0.4–3.1)
Retic Count, Absolute: 71.2 10*3/uL (ref 19.0–186.0)

## 2018-10-25 LAB — COMPREHENSIVE METABOLIC PANEL
ALT: 18 U/L (ref 0–44)
AST: 27 U/L (ref 15–41)
Albumin: 1.8 g/dL — ABNORMAL LOW (ref 3.5–5.0)
Alkaline Phosphatase: 37 U/L — ABNORMAL LOW (ref 38–126)
Anion gap: 6 (ref 5–15)
BUN: 22 mg/dL (ref 8–23)
CO2: 29 mmol/L (ref 22–32)
CREATININE: 0.6 mg/dL — AB (ref 0.61–1.24)
Calcium: 7.8 mg/dL — ABNORMAL LOW (ref 8.9–10.3)
Chloride: 102 mmol/L (ref 98–111)
GFR calc Af Amer: >60 mL/min (ref >60)
GFR calc non Af Amer: >60 mL/min (ref >60)
Glucose, Bld: 102 mg/dL — ABNORMAL HIGH (ref 70–99)
Potassium: 3.6 mmol/L (ref 3.5–5.1)
Sodium: 137 mmol/L (ref 135–145)
Total Bilirubin: 0.6 mg/dL (ref 0.3–1.2)
Total Protein: 4.5 g/dL — ABNORMAL LOW (ref 6.5–8.1)

## 2018-10-25 LAB — CBC WITH DIFFERENTIAL/PLATELET
Abs Immature Granulocytes: 0.04 10*3/uL (ref 0.00–0.07)
Basophils Absolute: 0 10*3/uL (ref 0.0–0.1)
Basophils Relative: 0 %
Eosinophils Absolute: 0.2 10*3/uL (ref 0.0–0.5)
Eosinophils Relative: 3 %
HCT: 32.1 % — ABNORMAL LOW (ref 39.0–52.0)
Hemoglobin: 10.4 g/dL — ABNORMAL LOW (ref 13.0–17.0)
Immature Granulocytes: 0 %
Lymphocytes Relative: 13 %
Lymphs Abs: 1.2 10*3/uL (ref 0.7–4.0)
MCH: 33 pg (ref 26.0–34.0)
MCHC: 32.4 g/dL (ref 30.0–36.0)
MCV: 101.9 fL — ABNORMAL HIGH (ref 80.0–100.0)
MONO ABS: 1.1 10*3/uL — AB (ref 0.1–1.0)
MONOS PCT: 12 %
Neutro Abs: 6.6 10*3/uL (ref 1.7–7.7)
Neutrophils Relative %: 72 %
Platelets: 139 10*3/uL — ABNORMAL LOW (ref 150–400)
RBC: 3.15 MIL/uL — AB (ref 4.22–5.81)
RDW: 13.2 % (ref 11.5–15.5)
WBC: 9.3 10*3/uL (ref 4.0–10.5)
nRBC: 0 % (ref 0.0–0.2)

## 2018-10-25 LAB — IRON AND TIBC
Iron: 7 ug/dL — ABNORMAL LOW (ref 45–182)
Saturation Ratios: 5 % — ABNORMAL LOW (ref 17.9–39.5)
TIBC: 136 ug/dL — ABNORMAL LOW (ref 250–450)
UIBC: 129 ug/dL

## 2018-10-25 LAB — SYNOVIAL CELL COUNT + DIFF, W/ CRYSTALS
EOSINOPHILS-SYNOVIAL: 0 % (ref 0–1)
Lymphocytes-Synovial Fld: 6 % (ref 0–20)
Monocyte-Macrophage-Synovial Fluid: 2 % — ABNORMAL LOW (ref 50–90)
Neutrophil, Synovial: 92 % — ABNORMAL HIGH (ref 0–25)
WBC, Synovial: 11700 /mm3 — ABNORMAL HIGH (ref 0–200)

## 2018-10-25 LAB — PROTIME-INR
INR: 1.56
Prothrombin Time: 18.5 seconds — ABNORMAL HIGH (ref 11.4–15.2)

## 2018-10-25 LAB — VITAMIN B12: Vitamin B-12: 188 pg/mL (ref 180–914)

## 2018-10-25 LAB — PHOSPHORUS: Phosphorus: 2.8 mg/dL (ref 2.5–4.6)

## 2018-10-25 LAB — MAGNESIUM: Magnesium: 1.8 mg/dL (ref 1.7–2.4)

## 2018-10-25 LAB — FOLATE: Folate: 21.8 ng/mL (ref >5.9)

## 2018-10-25 LAB — FERRITIN: Ferritin: 204 ng/mL (ref 24–336)

## 2018-10-25 MED ORDER — ACETAMINOPHEN 325 MG PO TABS
650.0000 mg | ORAL_TABLET | Freq: Four times a day (QID) | ORAL | Status: DC | PRN
  Administered 2018-10-25: 650 mg via ORAL
  Filled 2018-10-25: qty 2

## 2018-10-25 NOTE — Progress Notes (Signed)
[facility]opaedic [facility]uma [facility] (OTS)  3 Days Post-Op Procedure(s) (LRB): ARTHROPLASTY BIPOLAR HIP (HEMIARTHROPLASTY) (Left)  Subjective: Patient reports pain as mild and rather painful in the right elbow.    Objective: Current Vitals Blood pressure 106/65, pulse (!) 101, temperature 99.1 F (37.3 C), temperature source Oral, resp. rate (!) 24, height 5\' 6"  (1.676 m), weight 63.9 kg, SpO2 96 %. Vital signs in last 24 hours: Temp:  [97.7 F (36.5 C)-101.7 F (38.7 C)] 99.1 F (37.3 C) (02/17 0448) Pulse Rate:  [73-139] 101 (02/17 0448) Resp:  [20-31] 24 (02/17 0448) BP: (91-126)/(56-76) 106/65 (02/17 0448) SpO2:  [95 %-99 %] 96 % (02/17 0448) Weight:  [63.9 kg] 63.9 kg (02/17 0448)  Intake/Output from previous day: 02/16 0701 - 02/17 0700 In: 2939.2 [P.O.:717; I.V.:2222.2] Out: 700 [Urine:700]  LABS Recent Labs    10/23/18 0619 10/24/18 0441 10/25/18 0513  HGB 12.1* 11.8* 10.4*   Recent Labs    10/24/18 0441 10/25/18 0513  WBC 9.7 9.3  RBC 3.49* 3.15*  3.15*  HCT 35.5* 32.1*  PLT 136* 139*   Recent Labs    10/24/18 0441 10/25/18 0513  NA 136 137  K 3.7 3.6  CL 102 102  CO2 27 29  BUN 22 22  CREATININE 0.61 0.60*  GLUCOSE 107* 102*  CALCIUM 7.9* 7.8*   Recent Labs    10/24/18 0441 10/25/18 0513  INR 1.23 1.56     Physical Exam RUE  No splint; tender effusion of elbow without erythema or wound or excessive warmth  Sens  Ax/R/M/U intact  Mot   Ax/ R/ PIN/ M/ AIN/ U intact  Rad 2+ LLE Dressing intact, clean, dry, pristine  Edema/ swelling controlled  Sens: DPN, SPN, TN intact  Motor: EHL, FHL, and lessor toe ext and flex all intact grossly  Brisk cap refill, warm to touch  Knee immobilizer was in place for hip precautions only  Assessment/Plan: 3 Days Post-Op Procedure(s) (LRB): ARTHROPLASTY BIPOLAR HIP (HEMIARTHROPLASTY) (Left) 1. PT/OT WBAT on LLE; removed knee immobilizer 2. DVT proph Other (comment) Eliquis currently but Cards has  discussed taking him off long term because of fall risk 3. F/u 8-14 days post discharge  Myrene Galas, MD [facility] [phone] (p)

## 2018-10-25 NOTE — Progress Notes (Signed)
OT Cancellation Note  Patient Details Name: [name] MRN: [id number] [name]: [birth year]   Cancelled Treatment:    Reason Eval/Treat Not Completed: Medical issues which prohibited therapy. Pt in Afib with rate in 110's to 130's and transferred to 6E yesterday because of this. WIll await further treatment until rate is controlled.  Ignacia Palma, OTR/L Acute Rehab Services Pager [phone] Office [phone]     Evette Georges 10/25/2018, 10:58 AM

## 2018-10-25 NOTE — Progress Notes (Addendum)
Progress Note  Patient Name: [name] Date of Encounter: 10/25/2018  Primary Cardiologist: Hillis Range, MD   Subjective   Pt still in Afib RVR. Denies bleeding. Tolerating the rhythm well. C/o right elbow pain this morning. Plain films with probably fracture, awaiting CT of elbow.  Inpatient Medications    Scheduled Meds: . apixaban  5 mg Oral BID  . docusate sodium  100 mg Oral BID  . feeding supplement (ENSURE ENLIVE)  237 mL Oral TID BM  . metoprolol tartrate  12.5 mg Oral BID  . multivitamin with minerals  1 tablet Oral Daily  . mupirocin ointment  1 application Nasal BID   Continuous Infusions: . sodium chloride 50 mL/hr at 10/25/18 0700  . amiodarone 30 mg/hr (10/25/18 0700)  . lactated ringers Stopped (10/22/18 1017)  . lactated ringers 10 mL/hr at 10/22/18 0747  . methocarbamol (ROBAXIN) IV     PRN Meds: HYDROcodone-acetaminophen, menthol-cetylpyridinium **OR** phenol, methocarbamol **OR** methocarbamol (ROBAXIN) IV, metoCLOPramide **OR** metoCLOPramide (REGLAN) injection, morphine injection, ondansetron (ZOFRAN) IV   Vital Signs    Vitals:   10/24/18 1952 10/24/18 2222 10/25/18 0022 10/25/18 0448  BP: 108/63 101/67 (!) 106/58 106/65  Pulse: (!) 127 (!) 132 (!) 113 (!) 101  Resp: (!) 29 20 (!) 27 (!) 24  Temp: 99.3 F (37.4 C)  97.7 F (36.5 C) 99.1 F (37.3 C)  TempSrc: Oral  Axillary Oral  SpO2: 97% 96% 95% 96%  Weight:    63.9 kg  Height:        Intake/Output Summary (Last 24 hours) at 10/25/2018 1059 Last data filed at 10/25/2018 0700 Gross per 24 hour  Intake 2579.22 ml  Output 700 ml  Net 1879.22 ml   Last 3 Weights 10/25/2018 10/22/2018 10/21/2018  Weight (lbs) 140 lb 14 oz 143 lb 11.8 oz 143 lb 11.8 oz  Weight (kg) 63.9 kg 65.2 kg 65.2 kg      Telemetry    Afib 110s - Personally Reviewed  ECG     - Personally Reviewed  Physical Exam   GEN: No acute distress.   Neck: No JVD Cardiac: irregular rhythm, tachycardic  rate Respiratory: Clear to auscultation bilaterally. GI: Soft, nontender, non-distended  MS: s/p left hip surgery, now with probable right elbow fracture. Neuro:  Nonfocal  Psych: Normal affect   Labs    Chemistry Recent Labs  Lab 10/23/18 0619 10/24/18 0441 10/25/18 0513  NA 134* 136 137  K 4.3 3.7 3.6  CL 103 102 102  CO2 27 27 29   GLUCOSE 95 107* 102*  BUN 21 22 22   CREATININE 0.79 0.61 0.60*  CALCIUM 8.0* 7.9* 7.8*  PROT 5.1* 5.2* 4.5*  ALBUMIN 2.4* 2.2* 1.8*  AST 31 28 27   ALT 19 18 18   ALKPHOS 37* 44 37*  BILITOT 1.8* 1.3* 0.6  GFRNONAA >60 >60 >60  GFRAA >60 >60 >60  ANIONGAP 4* 7 6     Hematology Recent Labs  Lab 10/23/18 0619 10/24/18 0441 10/25/18 0513  WBC 10.8* 9.7 9.3  RBC 3.71* 3.49* 3.15*  3.15*  HGB 12.1* 11.8* 10.4*  HCT 37.6* 35.5* 32.1*  MCV 101.3* 101.7* 101.9*  MCH 32.6 33.8 33.0  MCHC 32.2 33.2 32.4  RDW 13.3 13.3 13.2  PLT 121* 136* 139*    Cardiac EnzymesNo results for input(s): TROPONINI in the last 168 hours. No results for input(s): TROPIPOC in the last 168 hours.   BNPNo results for input(s): BNP, PROBNP in the last 168 hours.  DDimer No results for input(s): DDIMER in the last 168 hours.   Radiology    Dg Elbow 2 Views Right  Result Date: 10/25/2018 CLINICAL DATA:  Fall.  Diffuse right elbow pain. EXAM: RIGHT ELBOW - 2 VIEW COMPARISON:  None. FINDINGS: Questionable radial head fracture. There are degenerative/arthropathic changes involving ulnar trochlear articulation has and the radial carpal articulation with marginal osteophytes and joint space narrowing. The apparent radial head fracture may be due to chronic degenerative changes and not an acute fracture. Suspect a joint effusion. There is mild posterior soft tissue swelling Bones are demineralized. IMPRESSION: 1. Limited study due to positioning. This is further limited by chronic degenerative changes of the elbow. 2. Possible radial head fracture and possible joint  effusion. These findings could be further assessed high-resolution elbow CT without contrast. 3. Mild posterior soft tissue swelling. Electronically Signed   By: Amie Portland M.D.   On: 10/25/2018 08:47    Cardiac Studies   none  Patient Profile     85 y.o. male  with a hx of paroxysmal atrial fibrillation, atrial tachycardia, prior pacemakerwho was seen for preoperative evaluation prior to repair of hip fracture and paroxysmal atrial fibrillation. Pt underwent hip surgery on 10/22/18.   Assessment & Plan    1.  Hx of PAF, admitted with Afib with RVR post-op with activity - home med: sotalol - PPM interrogated 09/07/18 with 38% Afib burden - sotalol washout for > 48 hr, EKG with QTc 439 ms - pt was not controlled on lopressor - loaded with IV amiodarone - This patients CHA2DS2-VASc Score and unadjusted Ischemic Stroke Rate (% per year) is equal to 2.2 % stroke rate/year from a score of 2 (age) - decision to avoid coumadin in the setting of amiodarone and also difficulty getting to INR checks; eliquis was discussed over the weekend and started, has received 2 doses of eliquis - Iron and TIBC low, Hb 10.4 - down 2 grams from prior to surgery - has received 2 doses of eliquis, patient denies bleeding  - HR in the 110s - Mg 1.8, K 3.6 - will discuss with attending possibly holding eliquis - will keep amio drip running while elbow pain is workedup - may eventually need DCCV if he can tolerate anticoagulation   2. PPM in place - followed by EP   3. Hip annuloplasty 10/22/18 - followed by ortho     For questions or updates, please contact [facility] Please consult [email or web address] for contact info under      Signed, Marcelino Duster, PA  10/25/2018, 10:59 AM       Patient seen and examined. Agree with assessment and plan.  Status post left femoral neck fracture with arthroplasty February 14.  Repeat ECG (independently reviewed by me) from 2/16 shows A. fib at 115 with LVH  and occasional PVCs.  The patient just underwent separation of fluid from his elbow joint space by orthopedics.  No definitive fracture of elbow.  AF rate is fast at 135 on amiodarone IV.  Will try increasing metoprolol to 25 mg twice a day if BP allows.  He is now on Eliquis. According to the patient's wife, there is a fall risk with him ocasionally falling backwards.  This will need to be further assessed will play a role in potentially discontinuing long-term anticoagulation in the future.   Lennette Bihari, MD, [facility] 10/25/2018 12:26 PM

## 2018-10-25 NOTE — Progress Notes (Signed)
Swelling noted to right elbow area.  Patient unable to move right arm/elbow due to complaint of pain with  movement, using left arm to move right arm.    RN text paged [facility] with this information.

## 2018-10-25 NOTE — Care Management (Signed)
#  4.   S / W  [facility]  @ [facility] # [phone]   APIXABAN : NONE FORMULARY  ELIQUIS   5 MG BID  COVER- YES CO-PAY- $ 451.47 TIER- NO PRIOR APPROVAL - NO  NO DEDUCTIBLE WITH PLAN OUT-OF-POCKET- NOT MET  PREFERRED PHARMACY- YES [facility]

## 2018-10-25 NOTE — Progress Notes (Signed)
PROGRESS NOTE    [name]  EAV:[id number] [name]: [birth year] DOA: 10/20/2018 PCP: [name], MD  Brief Narrative:  HPI per [name] on 10/20/2018 [name] is a 85 y.o. male with medical history significant of pacemaker placement; and afib presenting with a fall.   He got out of bed and was moving into his electric scooter.  He lost his balance while transferring and fell on his side.   He uses a walker for transfers but uses his scooter most of the time.  This is due to scoliosis and decreased leg strength.  He also had a bad knee on his right leg and now injured his left leg.  No dizziness, mechanical fall only.  **Surgical intervention was canceled initally due to his atrial fibrillation with RVR.  He was given Betapace along with metoprolol IV and still has elevated heart rate so Cardiology was consulted for further evaluation recommendations patient's sotalol was stopped and he was given 0.25 mg of IV digoxin twice. He underwent Surgical Intervention and has done well.  Dr. [name] spoke with the patient's son about plans for atrial fibrillation management and anticoagulation and the patient son is to talk with the family about anticoagulation plan is to start apixaban over warfarin and along with Amiodarone 10/24/2018 after sotalol washout.  Patient had uncontrolled heart rates so was transferred to cardiac telemetry and he was placed on IV amiodarone.  Heart rates were still very uncontrolled even on the IV amiodarone and metoprolol to 25 mg twice daily blood pressure allows.  Overnight patient spiked a temperature so blood cultures were obtained.  Patient was also complaining of right elbow pain and swelling so he underwent aspiration and x-ray which showed a possible radial head fracture so a CT of the elbow was ordered.  Assessment & Plan:   Principal Problem:   Left displaced femoral neck fracture (HCC) Active Problems:   ATRIAL FIBRILLATION  Essential hypertension, benign   Chronic pain syndrome   Memory loss   Protein-calorie malnutrition, severe   Pressure injury of skin  Left Displaced Femoral Neck Fracture status post surgical intervention POD3 -Mechanical fall resulting in hip fracture -Orthopedics consulted and repaired this morning as was unable to do yesterday because his heart rate was too elevated -Was NPO after midnight in anticipation of surgical repair but he was currently n.p.o. again given his intubation and SLP evaluated and now has been placed on a Diet -SCDs overnight; on VTE Prophylaxis but this is to be changed to Apixaban after discussion with Cardiology  -Family to decided about Anticoagulation and will be placed on Axpixaban over Warfarin and this was started -Pain control with Robxain, Vicodin, and Morphine prn as below -SW consult for rehab placement - prefers [facility] -Will need PT consult post-operatively -Hip fracture order set utilized; pain control as below -Continue with Cepacol lozenges and phenol mouth spray for sore throat after intubation -Antiemetics with Zofran 4 mg every 6 as needed nausea with po/IV Reglan 5 to 10 mg if Zofran is ineffective -Bowel regimen with docusate 100 mg p.o. twice daily  Paroxysmal A Fib with RVR Atrial Tachycardia Tachycardia-Bradycardia Syndrom s/p PPM -Usually Rate controlled with pacemaker but rates Uncontrolled and running high -Has prn Sotalol for breakthrough tachycardia and takes 160 mg po BIDprn for HR>120 and this has been stopped by Cardiology allowing for washout period  -Was given 2.5 mg of Lopressor x2 without success and now had been placed on 5 mg  q40minprn for HR>120 x2 doses and given these doses last night   -Was on Coumadin for [facility] and was given vitamin K to reverse his coagulopathy prior to surgery -Repeat INR was 1.23 this morning -Cardiology consulted for further evaluation and recommendations given his Tachycardia and mild Hypotension    -[name] had recommended holding sotalol and once that is cleared begin IV amiodarone to assist with rate and rhythm control; Amiodarone to start 10/24/2018 but was started po 400 mg BID however this was changed to IV amiodarone patient was transferred to telemetry because of uncontrolled heart rates in the 140s to 160s yesterday -Family to decided about Anticoagulation and will be placed on Axpixaban over Warfarin and Apixaban 5 mg po BID to start tonight  -Heart rates still are little uncontrolled so cardiology is attempting to increase metoprolol to 25 mg twice daily blood pressure log  HTN but is now lower  -Takes prn Sotalol, otherwise not on BP medications routinely -BP was on the lower side and was given a bolus yesterday -Hold Antihypertensives and have asked Cardiology to Weigh in -Now on Metoprolol 25 mg BID per Cardiology -Given 1 Liter Bolus given Hypotension yesterday morning. Continued Maintenance IVF with NS at 50 mL/hr but will stop now to prevent volume overload -BP is now 106/65  Chronic Pain DJD/DDD -Continue Hydrocodone-Acetaminophen 10-325 mg q4hprn with addition of Morphine 0.5 mg q2hprn for breakthrough/severe pain -Continue with methocarbamol 5 mg p.o./IV every 6 PRN for muscle spasms  Memory Loss -Patient appeared to have some difficulty answering all questions and continues to do so -Wife confirms some question of early dementia -Outpatient workup   GERD -Continue to Monitor for now  Leukocytosis, improving -In the setting of Fall and Fracture -WBC went from 11.4 -> 11.8 -> 10.7 -> 10.8 -> 9.3 -Continue to Monitor for S/Sx of Infection; Received Preoperative Abx already -Repeat CBC in AM  Thrombocytopenia, worsening  -In the setting of Fall; Platelet Count went from 179 -> 138 -> 124 -> 121 -> 136 -> 139 -Takes Coumadin at Home but this is currently being held -Continue to Monitor for S/Sx of Bleeding now that he will be initiated on Apixaban   -Repeat CBC in AM   Hyperglycemia -Patient's Glucose level on BMP/CMP ranging from 95-107 -Likely Reactive but has no HbA1c on file  -Continue to Monitor Blood Sugars carefully and if remaining elevated will place on Sensitive Novolog SSI  Hypokalemia -Patient's potassium this morning was 3.6; Goal is 4 or Greater -Continue to replete as necessary -Repeat CMP in a.m.  Hypophosphatemia -Patient's phosphorus level this morning was 2.9 -Continue monitor replete as necessary -Repeat phosphorus level in a.m.  Hyperbilirubinemia -T bili went from 1.6 -> 1.8 -> 1.3 -> 0.6 -Likely reactive -Continue monitor and trend and repeat CMP in a.m.  Severe Malnutrition in the Context of Chronic Illness -Nutritionist consult for further evaluation recommendations -Continue Ensure Enlive p.o. 3 times daily as well as multivitamin  Macrocytic Anemia -Patient's Hb/Hct went from 13.4/42.3 on admission -> 11.8/35.5 -> 10.4/32.1 -Checked Anemia Panel and showed an iron level of 7, U IBC of 129, TIBC 136, saturation ratios of 5%, ferritin level 204, folate level of 21.8 and vitamin B12 level 188 -Continue to Monitor for S/Sx of Bleeding -Repeat CBC in AM   Fever in the setting of Pseudogout? -Spiked a temperature overnight for unclear etiology -Obtain blood cultures x2 -Repeat chest x-ray in a.m. -Patient not complaining of anything except right elbow pain -X-ray of  the right elbow showed Limited study due to positioning. This is further limited by chronic degenerative changes of the elbow.Possible radial head fracture and possible joint effusion. These findings could be further assessed high-resolution elbow CT without contrast. Mild posterior soft tissue swelling. -CT of the elbow ordered will be canceled by orthopedic surgery -Ortho evaluated and aspirated the fluid and sent off for analysis -Fluid was yellow in color with a turbid appearance and there was intracellular calcium pyrophosphate  crystals along with 11,700 WBCs with 92% of those being neutrophils, 6% lymphocytes, and and 2% monocytes; -Aerobic culture was sent WBCs predominantly PMNs but no organisms and cultures still pending -May try some NSAIDs if tolerated  -Continue to Monitor Temperature Curve and follow Cx's  Right Elbow Pain -See As above  Hyponatremia -Mild at 134 and improved to 137 -Continue to Monitor and patient received a Bolus of 1 Liter of NS yesterday AM and is on Maintenance IVF at 50 mL/hr which has now been D/C'd  -Continue to Monitor and Trend and Repeat CMP in AM   DVT prophylaxis: SCDs for now and now will be anticoagulated with Apixaban 5 mg po BID Code Status: FULL CODE Family Communication: Wife at bedside Disposition Plan: Pending repeat evaluation but likely SNF  Consultants:   Orthopedic Surgery Dr. Carola FrostHandy  Cardiology    Procedures:  Hemiarthroplasty of Left Hip   Antimicrobials:  Anti-infectives (From admission, onward)   Start     Dose/Rate Route Frequency Ordered Stop   10/22/18 2000  vancomycin (VANCOCIN) IVPB 1000 mg/200 mL premix     1,000 mg 200 mL/hr over 60 Minutes Intravenous Every 12 hours 10/22/18 1213 10/22/18 2136   10/22/18 0745  vancomycin (VANCOCIN) IVPB 1000 mg/200 mL premix     1,000 mg 200 mL/hr over 60 Minutes Intravenous  Once 10/22/18 0741 10/23/18 0115   10/21/18 1300  ceFAZolin (ANCEF) IVPB 2g/100 mL premix  Status:  Discontinued     2 g 200 mL/hr over 30 Minutes Intravenous To [facility] 10/20/18 1932 10/21/18 0938   10/21/18 1015  ceFAZolin (ANCEF) IVPB 2g/100 mL premix     2 g 200 mL/hr over 30 Minutes Intravenous On call to O.R. 10/21/18 1006 10/22/18 0559   10/21/18 0800  ceFAZolin (ANCEF) IVPB 2g/100 mL premix  Status:  Discontinued     2 g 200 mL/hr over 30 Minutes [facility] 10/20/18 1924 10/20/18 1932     Subjective: Seen and examined at bedside and was resting but complained of Right Elbow Pain. No  nausea or vomiting. HR still fast. Wife concerned about going to SNF currently and I assured her he would not go until he was medically stable. No other concerns or complaints at this time.   Objective: Vitals:   10/24/18 1952 10/24/18 2222 10/25/18 0022 10/25/18 0448  BP: 108/63 101/67 (!) 106/58 106/65  Pulse: (!) 127 (!) 132 (!) 113 (!) 101  Resp: (!) 29 20 (!) 27 (!) 24  Temp: 99.3 F (37.4 C)  97.7 F (36.5 C) 99.1 F (37.3 C)  TempSrc: Oral  Axillary Oral  SpO2: 97% 96% 95% 96%  Weight:    63.9 kg  Height:        Intake/Output Summary (Last 24 hours) at 10/25/2018 1646 Last data filed at 10/25/2018 0700 Gross per 24 hour  Intake 2459.22 ml  Output 250 ml  Net 2209.22 ml   Filed Weights   10/21/18 0740 10/22/18 0731 10/25/18 0448  Weight: 65.2 kg  65.2 kg 63.9 kg   Examination: Physical Exam:  Constitutional: Thin elderly Caucasian male currently no acute distress who is resting in bed but complaining of some right elbow pain.  He still remains a little confused Eyes: Conjunctive are normal.  Sclera anicteric ENMT: External ears and nose appear normal.  Grossly normal hearing Neck: Appears supple no JVD Respiratory: Slight diminished auscultation bilaterally no patient wheezing, rales or rhonchi.  Patient is not tachypneic or using accessory muscle breathe Cardiovascular: Irregularly irregular and tachycardic rate.  Trace lower extremity edema noted  Abdomen: Soft, nontender, nondistended.  Bowel sounds present GU: Deferred Musculoskeletal: No contractures or cyanosis but has a need deformity and effusion from OA; right elbow is also swollen left leg is in immobilizer Skin: Skin is warm dry no appreciable rashes or lesions but does have some chronic seborrheic keratoses noted on his head and his chronic lower extremity immune status changes in all extremities.  Right elbow swollen and slightly erythematous Neurologic: Cranial nerves II through XII grossly intact no  appreciable focal deficits Psychiatric: Normal mood and affect he is slightly confused today and wanting to sleep  Data Reviewed: I have personally reviewed following labs and imaging studies  CBC: Recent Labs  Lab 10/21/18 0857 10/21/18 2329 10/22/18 0407 10/23/18 0619 10/24/18 0441 10/25/18 0513  WBC 11.8* 11.0* 10.7* 10.8* 9.7 9.3  NEUTROABS 9.8*  --  8.6* 8.2* 7.4 6.6  HGB 13.4 12.6* 12.6* 12.1* 11.8* 10.4*  HCT 39.9 37.9* 38.1* 37.6* 35.5* 32.1*  MCV 100.8* 98.4 100.0 101.3* 101.7* 101.9*  PLT 138* 121* 124* 121* 136* 139*   Basic Metabolic Panel: Recent Labs  Lab 10/20/18 1318 10/22/18 0407 10/23/18 0619 10/24/18 0441 10/25/18 0513  NA 136 137 134* 136 137  K 4.0 3.3* 4.3 3.7 3.6  CL 96* 101 103 102 102  CO2 29 27 27 27 29   GLUCOSE 105* 99 95 107* 102*  BUN 16 20 21 22 22   CREATININE 0.72 0.74 0.79 0.61 0.60*  CALCIUM 9.1 8.5* 8.0* 7.9* 7.8*  MG  --  1.7 1.7 1.9 1.8  PHOS  --  2.2* 3.0 1.9* 2.8   GFR: Estimated Creatinine Clearance: 58.7 mL/min (A) (by C-G formula based on SCr of 0.6 mg/dL (L)). Liver Function Tests: Recent Labs  Lab 10/22/18 0407 10/23/18 0619 10/24/18 0441 10/25/18 0513  AST 26 31 28 27   ALT 15 19 18 18   ALKPHOS 42 37* 44 37*  BILITOT 1.6* 1.8* 1.3* 0.6  PROT 5.3* 5.1* 5.2* 4.5*  ALBUMIN 2.6* 2.4* 2.2* 1.8*   No results for input(s): LIPASE, AMYLASE in the last 168 hours. No results for input(s): AMMONIA in the last 168 hours. Coagulation Profile: Recent Labs  Lab 10/21/18 0248 10/22/18 0407 10/23/18 0619 10/24/18 0441 10/25/18 0513  INR 1.50 1.32 1.36 1.23 1.56   Cardiac Enzymes: No results for input(s): CKTOTAL, CKMB, CKMBINDEX, TROPONINI in the last 168 hours. BNP (last 3 results) No results for input(s): PROBNP in the last 8760 hours. HbA1C: No results for input(s): HGBA1C in the last 72 hours. CBG: No results for input(s): GLUCAP in the last 168 hours. Lipid Profile: No results for input(s): CHOL, HDL, LDLCALC,  TRIG, CHOLHDL, LDLDIRECT in the last 72 hours. Thyroid Function Tests: No results for input(s): TSH, T4TOTAL, FREET4, T3FREE, THYROIDAB in the last 72 hours. Anemia Panel: Recent Labs    10/25/18 0513  VITAMINB12 188  FOLATE 21.8  FERRITIN 204  TIBC 136*  IRON 7*  RETICCTPCT 2.3  Sepsis Labs: No results for input(s): PROCALCITON, LATICACIDVEN in the last 168 hours.  Recent Results (from the past 240 hour(s))  MRSA PCR Screening     Status: Abnormal   Collection Time: 10/20/18  6:54 PM  Result Value Ref Range Status   MRSA by PCR POSITIVE (A) NEGATIVE Final    Comment:        The GeneXpert MRSA Assay (FDA approved for NASAL specimens only), is one component of a comprehensive MRSA colonization surveillance program. It is not intended to diagnose MRSA infection nor to guide or monitor treatment for MRSA infections. RESULT CALLED TO, READ BACK BY AND VERIFIED WITH: Darcella Cheshire RN 2209 10/20/18 A BROWNING Performed at [facility], [address], [place], [place] [address]   Aerobic/Anaerobic Culture (surgical/deep wound)     Status: None (Preliminary result)   Collection Time: 10/25/18 12:30 PM  Result Value Ref Range Status   Specimen Description SYNOVIAL RIGHT ELBOW  Final   Special Requests NONE  Final   Gram Stain   Final    MODERATE WBC PRESENT, PREDOMINANTLY PMN NO ORGANISMS SEEN Performed at [facility], [address] [place], [place] [address]    Culture PENDING  Incomplete   Report Status PENDING  Incomplete     RN Pressure Injury Documentation: Pressure Injury 10/24/18 Stage I -  Intact skin with non-blanchable redness of a localized area usually over a bony prominence. (Active)  10/24/18 2030  Location: Coccyx  Location Orientation:   Staging: Stage I -  Intact skin with non-blanchable redness of a localized area usually over a bony prominence.  Wound Description (Comments):   Present on Admission:      Estimated body mass index is  22.74 kg/m as calculated from the following:   Height as of this encounter: 5\' 6"  (1.676 m).   Weight as of this encounter: 63.9 kg.  Malnutrition Type:  Nutrition Problem: Severe Malnutrition Etiology: chronic illness(CAD)   Malnutrition Characteristics:  Signs/Symptoms: severe fat depletion, severe muscle depletion   Nutrition Interventions:  Interventions: Ensure Enlive (each supplement provides 350kcal and 20 grams of protein), MVI    Radiology Studies: Dg Elbow 2 Views Right  Result Date: 10/25/2018 CLINICAL DATA:  Fall.  Diffuse right elbow pain. EXAM: RIGHT ELBOW - 2 VIEW COMPARISON:  None. FINDINGS: Questionable radial head fracture. There are degenerative/arthropathic changes involving ulnar trochlear articulation has and the radial carpal articulation with marginal osteophytes and joint space narrowing. The apparent radial head fracture may be due to chronic degenerative changes and not an acute fracture. Suspect a joint effusion. There is mild posterior soft tissue swelling Bones are demineralized. IMPRESSION: 1. Limited study due to positioning. This is further limited by chronic degenerative changes of the elbow. 2. Possible radial head fracture and possible joint effusion. These findings could be further assessed high-resolution elbow CT without contrast. 3. Mild posterior soft tissue swelling. Electronically Signed   By: Amie Portland M.D.   On: 10/25/2018 08:47    Scheduled Meds: . apixaban  5 mg Oral BID  . docusate sodium  100 mg Oral BID  . feeding supplement (ENSURE ENLIVE)  237 mL Oral TID BM  . metoprolol tartrate  12.5 mg Oral BID  . multivitamin with minerals  1 tablet Oral Daily  . mupirocin ointment  1 application Nasal BID   Continuous Infusions: . sodium chloride 50 mL/hr at 10/25/18 0700  . amiodarone 30 mg/hr (10/25/18 0700)  . lactated ringers Stopped (10/22/18  1017)  . lactated ringers 10 mL/hr at 10/22/18 0747  . methocarbamol (ROBAXIN) IV       LOS: 5 days   Merlene Laughter, DO [facility] PAGER is on AMION  If 7PM-7AM, please contact night-coverage [email or web address] Password [facility] 10/25/2018, 4:46 PM

## 2018-10-26 LAB — COMPREHENSIVE METABOLIC PANEL
ALK PHOS: 45 U/L (ref 38–126)
ALT: 22 U/L (ref 0–44)
AST: 31 U/L (ref 15–41)
Albumin: 1.8 g/dL — ABNORMAL LOW (ref 3.5–5.0)
Anion gap: 7 (ref 5–15)
BUN: 25 mg/dL — ABNORMAL HIGH (ref 8–23)
CO2: 30 mmol/L (ref 22–32)
Calcium: 8.2 mg/dL — ABNORMAL LOW (ref 8.9–10.3)
Chloride: 99 mmol/L (ref 98–111)
Creatinine, Ser: 0.74 mg/dL (ref 0.61–1.24)
GFR calc Af Amer: >60 mL/min (ref >60)
GFR calc non Af Amer: >60 mL/min (ref >60)
Glucose, Bld: 103 mg/dL — ABNORMAL HIGH (ref 70–99)
Potassium: 4 mmol/L (ref 3.5–5.1)
SODIUM: 136 mmol/L (ref 135–145)
Total Bilirubin: 0.7 mg/dL (ref 0.3–1.2)
Total Protein: 5.3 g/dL — ABNORMAL LOW (ref 6.5–8.1)

## 2018-10-26 LAB — CBC WITH DIFFERENTIAL/PLATELET
ABS IMMATURE GRANULOCYTES: 0.07 10*3/uL (ref 0.00–0.07)
Basophils Absolute: 0 10*3/uL (ref 0.0–0.1)
Basophils Relative: 0 %
Eosinophils Absolute: 0.1 10*3/uL (ref 0.0–0.5)
Eosinophils Relative: 1 %
HCT: 32.8 % — ABNORMAL LOW (ref 39.0–52.0)
HEMOGLOBIN: 10.5 g/dL — AB (ref 13.0–17.0)
Immature Granulocytes: 1 %
Lymphocytes Relative: 9 %
Lymphs Abs: 1.1 10*3/uL (ref 0.7–4.0)
MCH: 32.9 pg (ref 26.0–34.0)
MCHC: 32 g/dL (ref 30.0–36.0)
MCV: 102.8 fL — ABNORMAL HIGH (ref 80.0–100.0)
Monocytes Absolute: 1.5 10*3/uL — ABNORMAL HIGH (ref 0.1–1.0)
Monocytes Relative: 13 %
NEUTROS ABS: 9 10*3/uL — AB (ref 1.7–7.7)
Neutrophils Relative %: 76 %
Platelets: 175 10*3/uL (ref 150–400)
RBC: 3.19 MIL/uL — ABNORMAL LOW (ref 4.22–5.81)
RDW: 13.3 % (ref 11.5–15.5)
WBC: 11.7 10*3/uL — ABNORMAL HIGH (ref 4.0–10.5)
nRBC: 0 % (ref 0.0–0.2)

## 2018-10-26 LAB — PROTIME-INR
INR: 1.44
Prothrombin Time: 17.4 seconds — ABNORMAL HIGH (ref 11.4–15.2)

## 2018-10-26 LAB — PHOSPHORUS: Phosphorus: 2.8 mg/dL (ref 2.5–4.6)

## 2018-10-26 LAB — MAGNESIUM: Magnesium: 1.8 mg/dL (ref 1.7–2.4)

## 2018-10-26 MED ORDER — METOPROLOL TARTRATE 12.5 MG HALF TABLET
12.5000 mg | ORAL_TABLET | Freq: Three times a day (TID) | ORAL | Status: DC
  Administered 2018-10-26: 12.5 mg via ORAL
  Filled 2018-10-26: qty 1

## 2018-10-26 MED ORDER — PREDNISONE 20 MG PO TABS
40.0000 mg | ORAL_TABLET | Freq: Every day | ORAL | Status: DC
  Administered 2018-10-27 – 2018-10-30 (×4): 40 mg via ORAL
  Filled 2018-10-26 (×4): qty 2

## 2018-10-26 MED ORDER — METOPROLOL TARTRATE 12.5 MG HALF TABLET
12.5000 mg | ORAL_TABLET | Freq: Four times a day (QID) | ORAL | Status: DC
  Administered 2018-10-26 – 2018-10-27 (×3): 12.5 mg via ORAL
  Filled 2018-10-26 (×3): qty 1

## 2018-10-26 MED ORDER — PANTOPRAZOLE SODIUM 40 MG PO TBEC
40.0000 mg | DELAYED_RELEASE_TABLET | Freq: Every day | ORAL | Status: DC
  Administered 2018-10-27 – 2018-10-30 (×4): 40 mg via ORAL
  Filled 2018-10-26 (×4): qty 1

## 2018-10-26 NOTE — Progress Notes (Signed)
Physical Therapy Treatment Patient Details Name: [name] MRN: [id number] [name]: Jan 02, 1929 Today's Date: 10/26/2018    History of Present Illness Pt is an 85 y.o. male admitted 10/20/18 after falling after scooter, now s/p  L hemiarthroplasty (WBAT; posterior hip preacautions). Pt with persistent R elbow pain since fall; xray shows potential radial head fx. PMH includes R knee pain, pacemaker, afib, chronic pain syndrome.   PT Comments    Pt seen today in conjunction with OT as pt limited by pain and decreased activity tolerance. Pt currently requiring maxA+2 to totalA for bed mobility and transfers. Able to perform lateral scoot transfer with assist+2. Wife present; educ on importance of BLE ROM. HR 90s-130s during session. Recommend nursing staff perform future bed<>chair transfers with maximove lift. Continue to recommend SNF-level therapies.   Follow Up Recommendations  SNF;Supervision/Assistance - 24 hour     Equipment Recommendations  None recommended by PT    Recommendations for Other Services       Precautions / Restrictions Precautions Precautions: Fall;Posterior Hip Restrictions Weight Bearing Restrictions: Yes RLE Weight Bearing: Weight bearing as tolerated LLE Weight Bearing: Weight bearing as tolerated Other Position/Activity Restrictions: Possible R radial head fx; awaiting orders from ortho    Mobility  Bed Mobility Overal bed mobility: Needs Assistance Bed Mobility: Supine to Sit   Sidelying to sit: Total assist;+2 for physical assistance       General bed mobility comments: TotalA+2 to assist trunk elevation and scoot hips to EOB via helicopter technique; pt with minimal initiation of BLE movement to EOB despite max cues  Transfers Overall transfer level: Needs assistance   Transfers: Lateral/Scoot Transfers          Lateral/Scoot Transfers: Total assist;+2 physical assistance;From elevated surface General transfer comment: Lateral scoot  transfer from bed to drop arm recliner towards L-side; pt requiring totalA+2 with use of bed pad; pt with posterior lean throughout requiring maxA to maintain partially upright trunk, maxA to bend L knee in prep to transfer. Recommend maximove lift transfer for return to bed with nursing  Ambulation/Gait             General Gait Details: NT   Stairs             Wheelchair Mobility    Modified Rankin (Stroke Patients Only)       Balance Overall balance assessment: Needs assistance;History of Falls Sitting-balance support: Bilateral upper extremity supported Sitting balance-Leahy Scale: Poor Sitting balance - Comments: Posterior and R-lateral lean requiring maxA to maintain upright posture; pt reliant on LUE support to pull trunk forward to upright, unable to initiate correction of R-lateral lean without assist                                    Cognition Arousal/Alertness: Awake/alert(cues to keep eyes open) Behavior During Therapy: Flat affect Overall Cognitive Status: Impaired/Different from baseline Area of Impairment: Attention;Memory;Following commands;Safety/judgement;Awareness;Problem solving                   Current Attention Level: Sustained Memory: Decreased short-term memory Following Commands: Follows one step commands with increased time Safety/Judgement: Decreased awareness of safety;Decreased awareness of deficits Awareness: Emergent Problem Solving: Slow processing;Difficulty sequencing        Exercises Other Exercises Other Exercises: Educ wife on ankle pumps and knee flex/ext     General Comments General comments (skin integrity, edema, etc.): Wife present and supportive.  SpO2 >/90% on 2L O2 [place] (although pleth not consistently reliable). HR 90s-130s      Pertinent Vitals/Pain Pain Assessment: Faces Faces Pain Scale: Hurts even more Pain Location: L hip and knee; RUE Pain Descriptors / Indicators:  Moaning;Discomfort;Guarding;Grimacing Pain Intervention(s): Limited activity within patient's tolerance;Monitored during session;Repositioned    Home Living                      Prior Function            PT Goals (current goals can now be found in the care plan section) Acute Rehab PT Goals Patient Stated Goal: Agreeable to rehab before return home PT Goal Formulation: With patient/family Time For Goal Achievement: 11/06/18 Potential to Achieve Goals: Good Progress towards PT goals: Progressing toward goals    Frequency    Min 3X/week      PT Plan Current plan remains appropriate    Co-evaluation PT/OT/SLP Co-Evaluation/Treatment: Yes Reason for Co-Treatment: Complexity of the patient's impairments (multi-system involvement);For patient/therapist safety;To address functional/ADL transfers PT goals addressed during session: Mobility/safety with mobility        AM-PAC PT "6 Clicks" Mobility   Outcome Measure  Help needed turning from your back to your side while in a flat bed without using bedrails?: Total Help needed moving from lying on your back to sitting on the side of a flat bed without using bedrails?: Total Help needed moving to and from a bed to a chair (including a wheelchair)?: Total Help needed standing up from a chair using your arms (e.g., wheelchair or bedside chair)?: Total Help needed to walk in hospital room?: Total Help needed climbing 3-5 steps with a railing? : Total 6 Click Score: 6    End of Session   Activity Tolerance: Patient tolerated treatment well;Patient limited by pain Patient left: in chair;with call bell/phone within reach;with chair alarm set;with family/visitor present Nurse Communication: Mobility status;Need for lift equipment PT Visit Diagnosis: Unsteadiness on feet (R26.81);Other abnormalities of gait and mobility (R26.89);Muscle weakness (generalized) (M62.81);Difficulty in walking, not elsewhere classified (R26.2)      Time: 0921-1000 PT Time Calculation (min) (ACUTE ONLY): 39 min  Charges:  $Therapeutic Activity: 23-37 mins                    Ina Homes, PT, DPT Acute Rehabilitation Services  Pager [phone] Office [phone]  Malachy Chamber 10/26/2018, 11:13 AM

## 2018-10-26 NOTE — Social Work (Signed)
Noted patient's family preference for [facility]. [facility] has offered a bed for patient. Left message for patient's spouse requesting call back. CSW to follow and support with disposition planning.  Abigail Butts, LCSW [phone]
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
## 2018-10-26 NOTE — Progress Notes (Signed)
PROGRESS NOTE    [name]  BCW:[id number] [name]: 08/27/1929 DOA: 10/20/2018 PCP: [name], MD  Brief Narrative:  HPI per [name] on 10/20/2018 Donald Ptak Potts is a 85 y.o. male with medical history significant of pacemaker placement; and afib presenting with a fall.   He got out of bed and was moving into his electric scooter.  He lost his balance while transferring and fell on his side.   He uses a walker for transfers but uses his scooter most of the time.  This is due to scoliosis and decreased leg strength.  He also had a bad knee on his right leg and now injured his left leg.  No dizziness, mechanical fall only.  **Surgical intervention was canceled initally due to his atrial fibrillation with RVR.  He was given Betapace along with metoprolol IV and still has elevated heart rate so Cardiology was consulted for further evaluation recommendations patient's sotalol was stopped and he was given 0.25 mg of IV digoxin twice. He underwent Surgical Intervention and has done well.  Dr. [name] spoke with the patient's son about plans for atrial fibrillation management and anticoagulation and the patient son is to talk with the family about anticoagulation plan is to start apixaban over warfarin and along with Amiodarone 10/24/2018 after sotalol washout.  Patient had uncontrolled heart rates so was transferred to cardiac telemetry and he was placed on IV amiodarone.  Heart rates were still very uncontrolled even on the IV amiodarone and metoprolol to 25 mg twice daily blood pressure allows.  Overnight patient spiked a temperature so blood cultures were obtained.  Patient was also complaining of right elbow pain and swelling so he underwent aspiration and x-ray which showed a possible radial head fracture so a CT of the elbow was ordered but cancelled by Ortho. Instead he underwent a Joint aspiration at the elbow and results showed Pseudogout so he was started on  Prednisone. HR is still very much uncontrolled and Cardiology adjusting medications carefully.   Assessment & Plan:   Principal Problem:   Left displaced femoral neck fracture (HCC) Active Problems:   ATRIAL FIBRILLATION   Essential hypertension, benign   Chronic pain syndrome   Memory loss   Protein-calorie malnutrition, severe   Pressure injury of skin  Left Displaced Femoral Neck Fracture status post surgical intervention POD4 -Mechanical fall resulting in hip fracture -Orthopedics consulted and repaired this morning as was unable to do yesterday because his heart rate was too elevated -Was NPO after midnight in anticipation of surgical repair but he was currently n.p.o. again given his intubation and SLP evaluated and now has been placed on a Diet -SCDs overnight; on VTE Prophylaxis but this is to be changed to Apixaban after discussion with Cardiology  -Family to decided about Anticoagulation and will be placed on Axpixaban over Warfarin and this was started -Pain control with Robxain, Vicodin, and Morphine prn as below -SW consult for rehab placement - prefers [facility] -Will need PT consult post-operatively -Hip fracture order set utilized; pain control as below -Continue with Cepacol lozenges and phenol mouth spray for sore throat after intubation -Antiemetics with Zofran 4 mg every 6 as needed nausea with po/IV Reglan 5 to 10 mg if Zofran is ineffective -Bowel regimen with docusate 100 mg p.o. twice daily  Paroxysmal A Fib with RVR Atrial Tachycardia Tachycardia-Bradycardia Syndrom s/p PPM -Usually Rate controlled with pacemaker but rates Uncontrolled and running high -Had prn Sotalol for breakthrough tachycardia  and takes 160 mg po BIDprn for HR>120 and this has been stopped by Cardiology allowing for washout period  -Was given 2.5 mg of Lopressor x2 without success and now had been placed on 5 mg q21minprn for HR>120 x2 doses and given these doses last night   -Was on  Coumadin for [facility] and was given vitamin K to reverse his coagulopathy prior to surgery -Repeat INR was 1.44 this morning but will cancel PT-INR's now that he is on Apixaban  -Cardiology consulted for further evaluation and recommendations given his Tachycardia and mild Hypotension  -[name] had recommended holding sotalol and once that is cleared begin IV amiodarone to assist with rate and rhythm control; Amiodarone to start 10/24/2018 but was started po 400 mg BID however this was changed to IV amiodarone patient was transferred to Cardiac Telemetry because of uncontrolled heart rates in the 140s to 160s  -Family to decided about Anticoagulation and will be placed on Axpixaban over Warfarin and Apixaban 5 mg po BID to start tonight  -Heart rates still are little uncontrolled so Cardiology is attempting to increase metoprolol to 25 mg twice daily but are now instead changing Metoprolol Tartrate 12.5 mg BID to q6h with Holding Parameters of a SBP <90; Ultimately they want to transition the patient to Metoprolol Succinate -Cardiology repeated ECHO and pending as last ECHO was in 2011  HTN but is now lower BP's -Takes prn Sotalol, otherwise not on BP medications routinely -BP was on the lower side and was given a bolus yesterday -Hold Antihypertensives and have asked Cardiology to Weigh in -Now on Metoprolol 12.5 mg q6h per Cardiology -IVF now stopped  -BP is now 102/65  Chronic Pain DJD/DDD -Continue Hydrocodone-Acetaminophen 10-325 mg q4hprn with addition of Morphine 0.5 mg q2hprn for breakthrough/severe pain -Continue with methocarbamol 5 mg p.o./IV every 6 PRN for muscle spasms  Memory Loss -Patient appeared to have some difficulty answering all questions and continues to do so -Wife confirms some question of early dementia -Outpatient workup   GERD -Will place on Protonix 40 mg daily now that we are adding Prednisone 40 mg Daily for Pseudogout   Leukocytosis, improving -In the  setting of Fall and Fracture -WBC went from 11.4 -> 11.8 -> 10.7 -> 10.8 -> 9.3 -> 11.7 -Continue to Monitor for S/Sx of Infection; Received Preoperative Abx already -Repeat CBC in AM  Thrombocytopenia, initially worsened and now improved  -In the setting of Fall; Platelet Count went from 179 -> 121 -> 175 -Takes Coumadin at Home but this is currently being stopped and he has now been transitioned to Apixaban  -Continue to Monitor for S/Sx of Bleeding now that he will be initiated on Apixaban  -Repeat CBC in AM   Hyperglycemia -Patient's Glucose level on BMP/CMP ranging from 95-107 -Likely Reactive but has no HbA1c on file  -Continue to Monitor Blood Sugars carefully and if remaining elevated will place on Sensitive Novolog SSI  Hypokalemia -Patient's potassium this morning was 4.0; Goal is 4 or Greater -Continue to replete as necessary -Repeat CMP in a.m.  Hypophosphatemia -Patient's phosphorus level this morning was 2.9 -Continue monitor replete as necessary -Repeat phosphorus level in a.m.  Hyperbilirubinemia -T bili went from 1.6 -> 1.8 -> 0.7 -Likely reactive -Continue monitor and trend and repeat CMP in a.m.  Severe Malnutrition in the Context of Chronic Illness -Nutritionist consult for further evaluation recommendations -Continue Ensure Enlive p.o. 3 times daily as well as multivitamin  Macrocytic Anemia -Patient's Hb/Hct went  from 13.4/42.3 on admission -> 10.5/32.8 -Checked Anemia Panel and showed an iron level of 7, U IBC of 129, TIBC 136, saturation ratios of 5%, ferritin level 204, folate level of 21.8 and vitamin B12 level 188 -Continue to Monitor for S/Sx of Bleeding -Repeat CBC in AM   Fever in the setting of Pseudogout -Spiked a temperature overnight 2/16-2/17 for unclear etiology and had a temperature of 101 overnight  -Obtained blood cultures x2 and showed NGTD at 1 Day  -Repeat chest x-ray not done  -Patient not complaining of anything except right  elbow pain and now wrist pain  -X-ray of the right elbow showed Limited study due to positioning. This is further limited by chronic degenerative changes of the elbow.Possible radial head fracture and possible joint effusion. These findings could be further assessed high-resolution elbow CT without contrast. Mild posterior soft tissue swelling. -CT of the elbow ordered but was canceled by Orthopedic surgery -Ortho evaluated and aspirated the fluid and sent off for analysis -Fluid was yellow in color with a turbid appearance and there was intracellular calcium pyrophosphate crystals along with 11,700 WBCs with 92% of those being neutrophils, 6% lymphocytes, and and 2% monocytes; -Aerobic culture was sent WBCs predominantly PMNs but no organisms and cultures showed No Growth <24 hours -May try some NSAIDs if tolerated but will opt for Steroid Taper and start the patient on Prednisone 40 mg Daily and titrate accordingly  -Continue to Monitor Temperature Curve and follow Cx's  Right Elbow Pain/Right Wrist Pain  -See As above  Hyponatremia -Mild at 134 and improved to 136 -Continue to Monitor and IVF has been discontinued  -Continue to Monitor and Trend and Repeat CMP in AM   DVT prophylaxis: SCDs for now and now will be anticoagulated with Apixaban 5 mg po BID Code Status: FULL CODE Family Communication: Wife at bedside Disposition Plan: SNF when HR is better controlled and no longer spiking temperatures   Consultants:   Orthopedic Surgery  Cardiology    Procedures:  Hemiarthroplasty of Left Hip; Right Elbow Aspiration    Antimicrobials:  Anti-infectives (From admission, onward)   Start     Dose/Rate Route Frequency Ordered Stop   10/22/18 2000  vancomycin (VANCOCIN) IVPB 1000 mg/200 mL premix     1,000 mg 200 mL/hr over 60 Minutes Intravenous Every 12 hours 10/22/18 1213 10/22/18 2136   10/22/18 0745  vancomycin (VANCOCIN) IVPB 1000 mg/200 mL premix     1,000 mg 200 mL/hr over  60 Minutes Intravenous  Once 10/22/18 0741 10/23/18 0115   10/21/18 1300  ceFAZolin (ANCEF) IVPB 2g/100 mL premix  Status:  Discontinued     2 g 200 mL/hr over 30 Minutes Intravenous To ShortStay Surgical 10/20/18 1932 10/21/18 0938   10/21/18 1015  ceFAZolin (ANCEF) IVPB 2g/100 mL premix     2 g 200 mL/hr over 30 Minutes Intravenous On call to O.R. 10/21/18 1006 10/22/18 0559   10/21/18 0800  ceFAZolin (ANCEF) IVPB 2g/100 mL premix  Status:  Discontinued     2 g 200 mL/hr over 30 Minutes Intravenous To ShortStay Surgical 10/20/18 1924 10/20/18 1932     Subjective: Seen and examined at bedside and resting but complained of some elbow pain and some wrist pain today.  No nausea or vomiting.  Wife is concerned about his heart rate still fluctuating.  Patient has been sleeping more per wife.  No lightheadedness or dizziness and no other concerns or complaints at this time  Objective: Vitals:  10/26/18 0740 10/26/18 1141 10/26/18 1413 10/26/18 1616  BP: 109/65 110/74 112/84 102/65  Pulse: (!) 104 (!) 120 (!) 122   Resp: (!) 24 (!) 27  14  Temp: (!) 97.4 F (36.3 C) 98.2 F (36.8 C)  98.1 F (36.7 C)  TempSrc: Oral Axillary  Oral  SpO2: 100% 100%    Weight:      Height:        Intake/Output Summary (Last 24 hours) at 10/26/2018 1649 Last data filed at 10/26/2018 0840 Gross per 24 hour  Intake 964.38 ml  Output 600 ml  Net 364.38 ml   Filed Weights   10/22/18 0731 10/25/18 0448 10/26/18 0500  Weight: 65.2 kg 63.9 kg 64.8 kg   Examination: Physical Exam:  Constitutional: Thin elderly Caucasian male currently no acute distress who is resting in bed but slightly agitated  Eyes: Lids and conjunctive are normal.  Sclera anicteric ENMT: External ears and nose appear normal.  Grossly normal hearing Neck: Appears supple no JVD Respiratory: Slightly diminished auscultation bilaterally no appreciable wheezing, rales, rhonchi.  Patient not tachypneic wheezing and accessory muscles to  breathe Cardiovascular: Irregularly irregular and tachycardic rate with rates up to the 130s.  Trace lower extremity edema noted and has chronic venous stasis changes Abdomen: Soft, nontender, nondistended.  Bowel sounds present GU: Deferred Musculoskeletal: No contractures or cyanosis but he also does have a joint deformity and effusion in his right knee from OA.  Right elbow is mildly swollen left leg is in immobilizer.  Right wrist is also slightly swollen Skin: Skin is warm and dry no appreciable rashes or lesions but does have some seborrheic keratoses noted on his head and his right wrist is slightly swollen and erythematous.  Right elbow is also swollen and slightly erythematous with some slight edema Neurologic: Cranial nerves II through XII grossly intact no appreciable focal deficits Psychiatric: Slightly somnolent but easily arouses.  Is awake alert and oriented.  Slightly agitated  Data Reviewed: I have personally reviewed following labs and imaging studies  CBC: Recent Labs  Lab 10/22/18 0407 10/23/18 0619 10/24/18 0441 10/25/18 0513 10/26/18 0408  WBC 10.7* 10.8* 9.7 9.3 11.7*  NEUTROABS 8.6* 8.2* 7.4 6.6 9.0*  HGB 12.6* 12.1* 11.8* 10.4* 10.5*  HCT 38.1* 37.6* 35.5* 32.1* 32.8*  MCV 100.0 101.3* 101.7* 101.9* 102.8*  PLT 124* 121* 136* 139* 175   Basic Metabolic Panel: Recent Labs  Lab 10/22/18 0407 10/23/18 0619 10/24/18 0441 10/25/18 0513 10/26/18 0408  NA 137 134* 136 137 136  K 3.3* 4.3 3.7 3.6 4.0  CL 101 103 102 102 99  CO2 27 27 27 29 30   GLUCOSE 99 95 107* 102* 103*  BUN 20 21 22 22  25*  CREATININE 0.74 0.79 0.61 0.60* 0.74  CALCIUM 8.5* 8.0* 7.9* 7.8* 8.2*  MG 1.7 1.7 1.9 1.8 1.8  PHOS 2.2* 3.0 1.9* 2.8 2.8   GFR: Estimated Creatinine Clearance: 58.7 mL/min (by C-G formula based on SCr of 0.74 mg/dL). Liver Function Tests: Recent Labs  Lab 10/22/18 0407 10/23/18 0619 10/24/18 0441 10/25/18 0513 10/26/18 0408  AST 26 31 28 27 31   ALT 15  19 18 18 22   ALKPHOS 42 37* 44 37* 45  BILITOT 1.6* 1.8* 1.3* 0.6 0.7  PROT 5.3* 5.1* 5.2* 4.5* 5.3*  ALBUMIN 2.6* 2.4* 2.2* 1.8* 1.8*   No results for input(s): LIPASE, AMYLASE in the last 168 hours. No results for input(s): AMMONIA in the last 168 hours. Coagulation Profile: Recent Labs  Lab 10/22/18 0407 10/23/18 2637 10/24/18 0441 10/25/18 0513 10/26/18 0408  INR 1.32 1.36 1.23 1.56 1.44   Cardiac Enzymes: No results for input(s): CKTOTAL, CKMB, CKMBINDEX, TROPONINI in the last 168 hours. BNP (last 3 results) No results for input(s): PROBNP in the last 8760 hours. HbA1C: No results for input(s): HGBA1C in the last 72 hours. CBG: No results for input(s): GLUCAP in the last 168 hours. Lipid Profile: No results for input(s): CHOL, HDL, LDLCALC, TRIG, CHOLHDL, LDLDIRECT in the last 72 hours. Thyroid Function Tests: No results for input(s): TSH, T4TOTAL, FREET4, T3FREE, THYROIDAB in the last 72 hours. Anemia Panel: Recent Labs    10/25/18 0513  VITAMINB12 188  FOLATE 21.8  FERRITIN 204  TIBC 136*  IRON 7*  RETICCTPCT 2.3   Sepsis Labs: No results for input(s): PROCALCITON, LATICACIDVEN in the last 168 hours.  Recent Results (from the past 240 hour(s))  MRSA PCR Screening     Status: Abnormal   Collection Time: 10/20/18  6:54 PM  Result Value Ref Range Status   MRSA by PCR POSITIVE (A) NEGATIVE Final    Comment:        The GeneXpert MRSA Assay (FDA approved for NASAL specimens only), is one component of a comprehensive MRSA colonization surveillance program. It is not intended to diagnose MRSA infection nor to guide or monitor treatment for MRSA infections. RESULT CALLED TO, READ BACK BY AND VERIFIED WITH: Darcella Cheshire RN [id number] 10/20/18 A BROWNING Performed at [facility], [address], [place], [place] [address]   Culture, blood (routine x 2)     Status: None (Preliminary result)   Collection Time: 10/25/18  9:05 AM  Result Value Ref Range Status    Specimen Description BLOOD LEFT ANTECUBITAL  Final   Special Requests   Final    BOTTLES DRAWN AEROBIC ONLY Blood Culture adequate volume   Culture   Final    NO GROWTH 1 DAY Performed at [facility], [address], [place], [place] [address]    Report Status PENDING  Incomplete  Culture, blood (routine x 2)     Status: None (Preliminary result)   Collection Time: 10/25/18  9:11 AM  Result Value Ref Range Status   Specimen Description BLOOD LEFT HAND  Final   Special Requests   Final    BOTTLES DRAWN AEROBIC ONLY Blood Culture adequate volume   Culture   Final    NO GROWTH 1 DAY Performed at [facility], [address], [place], [place] [address]    Report Status PENDING  Incomplete  Aerobic/Anaerobic Culture (surgical/deep wound)     Status: None (Preliminary result)   Collection Time: 10/25/18 12:30 PM  Result Value Ref Range Status   Specimen Description SYNOVIAL RIGHT ELBOW  Final   Special Requests NONE  Final   Gram Stain   Final    MODERATE WBC PRESENT, PREDOMINANTLY PMN NO ORGANISMS SEEN    Culture   Final    NO GROWTH < 24 HOURS Performed at [facility], [address], [place], [place] [address]    Report Status PENDING  Incomplete     RN Pressure Injury Documentation: Pressure Injury 10/24/18 Stage I -  Intact skin with non-blanchable redness of a localized area usually over a bony prominence. (Active)  10/24/18 2030  Location: Coccyx  Location Orientation:   Staging: Stage I -  Intact skin with non-blanchable redness of a localized area usually over a bony  prominence.  Wound Description (Comments):   Present on Admission:      Estimated body mass index is 23.06 kg/m as calculated from the following:   Height as of this encounter: 5\' 6"  (1.676 m).   Weight as of this encounter: 64.8 kg.  Malnutrition Type:  Nutrition Problem: Severe Malnutrition Etiology: chronic illness(CAD)   Malnutrition Characteristics:  Signs/Symptoms:  severe fat depletion, severe muscle depletion   Nutrition Interventions:  Interventions: Ensure Enlive (each supplement provides 350kcal and 20 grams of protein), MVI    Radiology Studies: Dg Elbow 2 Views Right  Result Date: 10/25/2018 CLINICAL DATA:  Fall.  Diffuse right elbow pain. EXAM: RIGHT ELBOW - 2 VIEW COMPARISON:  None. FINDINGS: Questionable radial head fracture. There are degenerative/arthropathic changes involving ulnar trochlear articulation has and the radial carpal articulation with marginal osteophytes and joint space narrowing. The apparent radial head fracture may be due to chronic degenerative changes and not an acute fracture. Suspect a joint effusion. There is mild posterior soft tissue swelling Bones are demineralized. IMPRESSION: 1. Limited study due to positioning. This is further limited by chronic degenerative changes of the elbow. 2. Possible radial head fracture and possible joint effusion. These findings could be further assessed high-resolution elbow CT without contrast. 3. Mild posterior soft tissue swelling. Electronically Signed   By: Amie Portland M.D.   On: 10/25/2018 08:47    Scheduled Meds: . apixaban  5 mg Oral BID  . docusate sodium  100 mg Oral BID  . feeding supplement (ENSURE ENLIVE)  237 mL Oral TID BM  . metoprolol tartrate  12.5 mg Oral Q6H  . multivitamin with minerals  1 tablet Oral Daily  . [START ON 10/27/2018] predniSONE  40 mg Oral Q breakfast   Continuous Infusions: . amiodarone 30 mg/hr (10/26/18 0650)  . lactated ringers Stopped (10/22/18 1017)  . lactated ringers 10 mL/hr at 10/22/18 0747  . methocarbamol (ROBAXIN) IV      LOS: 6 days   Merlene Laughter, DO [facility] PAGER is on AMION  If 7PM-7AM, please contact night-coverage [email or web address] Password [facility] 10/26/2018, 4:49 PM

## 2018-10-26 NOTE — Progress Notes (Addendum)
Progress Note  Patient Name: [name] Date of Encounter: 10/26/2018  Primary Cardiologist: Hillis Range, MD   Subjective   Pt still w/ pain R elbow, but a little better after aspiration. Wife present, he has not been doing incentive spirometry>>>gave her instructions for it.   Inpatient Medications    Scheduled Meds: . apixaban  5 mg Oral BID  . docusate sodium  100 mg Oral BID  . feeding supplement (ENSURE ENLIVE)  237 mL Oral TID BM  . metoprolol tartrate  12.5 mg Oral BID  . multivitamin with minerals  1 tablet Oral Daily  . [START ON 10/27/2018] predniSONE  40 mg Oral Q breakfast   Continuous Infusions: . amiodarone 30 mg/hr (10/26/18 0650)  . lactated ringers Stopped (10/22/18 1017)  . lactated ringers 10 mL/hr at 10/22/18 0747  . methocarbamol (ROBAXIN) IV     PRN Meds: acetaminophen, HYDROcodone-acetaminophen, menthol-cetylpyridinium **OR** phenol, methocarbamol **OR** methocarbamol (ROBAXIN) IV, metoCLOPramide **OR** metoCLOPramide (REGLAN) injection, morphine injection, ondansetron (ZOFRAN) IV   Vital Signs    Vitals:   10/26/18 0015 10/26/18 0500 10/26/18 0740 10/26/18 1141  BP: 118/66 116/79 109/65 110/74  Pulse:   (!) 104 (!) 120  Resp:   (!) 24 (!) 27  Temp: 98.2 F (36.8 C) 98.1 F (36.7 C) (!) 97.4 F (36.3 C) 98.2 F (36.8 C)  TempSrc: Oral  Oral Axillary  SpO2:   100% 100%  Weight:  64.8 kg    Height:        Intake/Output Summary (Last 24 hours) at 10/26/2018 1302 Last data filed at 10/26/2018 0840 Gross per 24 hour  Intake 964.38 ml  Output 600 ml  Net 364.38 ml   Last 3 Weights 10/26/2018 10/25/2018 10/22/2018  Weight (lbs) 142 lb 13.7 oz 140 lb 14 oz 143 lb 11.8 oz  Weight (kg) 64.8 kg 63.9 kg 65.2 kg      Telemetry    Afib, HR lower overnight but still generally > 100, faster this am, rare PVCs - Personally Reviewed  ECG    None today - Personally Reviewed  Physical Exam   General: Well developed, well nourished, male in  no acute distress Head: Eyes PERRLA, No xanthomas.   Normocephalic and atraumatic Lungs: rales bases bilaterally to auscultation. Heart: rapid and irreg R&R, S1 S2, without MRG.  Pulses are 2+ & equal. No JVD. Abdomen: Bowel sounds are present, abdomen soft and non-tender without masses or  hernias noted. Msk: Very weakl strength and tone for age. Extremities: No clubbing, cyanosis or edema. R elbow and L hip bandages not disturbed Skin:  No rashes or lesions noted. Neuro: Alert and oriented X 3. Psych:  Good affect, responds appropriately   Labs    Chemistry Recent Labs  Lab 10/24/18 0441 10/25/18 0513 10/26/18 0408  NA 136 137 136  K 3.7 3.6 4.0  CL 102 102 99  CO2 27 29 30   GLUCOSE 107* 102* 103*  BUN 22 22 25*  CREATININE 0.61 0.60* 0.74  CALCIUM 7.9* 7.8* 8.2*  PROT 5.2* 4.5* 5.3*  ALBUMIN 2.2* 1.8* 1.8*  AST 28 27 31   ALT 18 18 22   ALKPHOS 44 37* 45  BILITOT 1.3* 0.6 0.7  GFRNONAA >60 >60 >60  GFRAA >60 >60 >60  ANIONGAP 7 6 7      Hematology Recent Labs  Lab 10/24/18 0441 10/25/18 0513 10/26/18 0408  WBC 9.7 9.3 11.7*  RBC 3.49* 3.15*  3.15* 3.19*  HGB 11.8* 10.4* 10.5*  HCT  35.5* 32.1* 32.8*  MCV 101.7* 101.9* 102.8*  MCH 33.8 33.0 32.9  MCHC 33.2 32.4 32.0  RDW 13.3 13.2 13.3  PLT 136* 139* 175    Radiology    Dg Elbow 2 Views Right  Result Date: 10/25/2018 CLINICAL DATA:  Fall.  Diffuse right elbow pain. EXAM: RIGHT ELBOW - 2 VIEW COMPARISON:  None. FINDINGS: Questionable radial head fracture. There are degenerative/arthropathic changes involving ulnar trochlear articulation has and the radial carpal articulation with marginal osteophytes and joint space narrowing. The apparent radial head fracture may be due to chronic degenerative changes and not an acute fracture. Suspect a joint effusion. There is mild posterior soft tissue swelling Bones are demineralized. IMPRESSION: 1. Limited study due to positioning. This is further limited by chronic  degenerative changes of the elbow. 2. Possible radial head fracture and possible joint effusion. These findings could be further assessed high-resolution elbow CT without contrast. 3. Mild posterior soft tissue swelling. Electronically Signed   By: Amie Portland M.D.   On: 10/25/2018 08:47    Cardiac Studies   none  Patient Profile     85 y.o. male  with a hx of paroxysmal atrial fibrillation, atrial tachycardia, prior pacemakerwho was seen for preoperative evaluation prior to repair of hip fracture and paroxysmal atrial fibrillation. Pt underwent hip surgery on 10/22/18.   Assessment & Plan    1.  Hx of PAF, admitted with Afib with RVR post-op with activity - hx PPM, was 38% Afib 09/07/2018 - sotalol pta>>washout x 48 hr>>amio - metoprolol 12.5 mg bid not controlling the rate - CHA2DS2-VASc =2, (age) - has been on Eliquis since 02/16, tolerating well - Iron def anemia, H&H stable last 48 hr. - increase metoprolol to 12.5 mg tid>>qid if tolerates well. - consider trying Cardizem, IV w/ no bolus to see if that helps more w/ HR - EF nl 2011, will update the echo, if atria are severely dilated, may have to pursue rate control only.  2. PPM in place - per Dr [name]  3. Hip annuloplasty 10/22/18 - per Ortho     For questions or updates, please contact [facility] Please consult [email or web address] for contact info under      Signed, Theodore Demark, PA-C  10/26/2018, 1:02 PM     Patient seen and examined. Agree with assessment and plan.  Complains of elbow soreness.  AF rate still highly variable ranging from 125 to 140s, despite being on amiodarone infusion at 30 mg/h.  He has also been receiving metoprolol 12.5 mg twice a day; will change to every 6 hours and hold a dose if blood pressure less than 98 ultimate transition to long-acting metoprolol succinate.  Anticoagulated on Eliquis.  No bleeding.  Follow-up echo Doppler study.   Lennette Bihari, MD, [facility] 10/26/2018 4:25 PM

## 2018-10-26 NOTE — Progress Notes (Signed)
Orthopedic Trauma Service Progress Note  Patient ID: [name] MRN: [id number] [name]/AGE: 82/05/1929 [age] y.o.  Subjective:  Doing ok Just got up to chair with therapy   + R elbow and wrist pain   Fluid analysis consistent with inflammatory arthropathy---> pseudogout  Suspect he may be having a flare in his R wrist as well    ROS As above  Objective:   VITALS:   Vitals:   10/25/18 2200 10/26/18 0015 10/26/18 0500 10/26/18 0740  BP: 106/74 118/66 116/79 109/65  Pulse: (!) 130   (!) 104  Resp: (!) 22   (!) 24  Temp: (!) 101 F (38.3 C) 98.2 F (36.8 C) 98.1 F (36.7 C) (!) 97.4 F (36.3 C)  TempSrc: Oral Oral  Oral  SpO2: 93%   100%  Weight:   64.8 kg   Height:        Estimated body mass index is 23.06 kg/m as calculated from the following:   Height as of this encounter: 5\' 6"  (1.676 m).   Weight as of this encounter: 64.8 kg.   Intake/Output      02/17 0701 - 02/18 0700 02/18 0701 - 02/19 0700   P.O. 337    I.V. (mL/kg) 387.4 (6)    Total Intake(mL/kg) 724.4 (11.2)    Urine (mL/kg/hr) 600 (0.4)    Stool     Total Output 600    Net +124.4           LABS  Results for orders placed or performed during the hospital encounter of 10/20/18 (from the past 24 hour(s))  Synovial cell count + diff, w/ crystals     Status: Abnormal   Collection Time: 10/25/18 12:30 PM  Result Value Ref Range   Color, Synovial YELLOW (A) YELLOW   Appearance-Synovial TURBID (A) CLEAR   Crystals, Fluid INTRACELLULAR CALCIUM PYROPHOSPHATE CRYSTALS    WBC, Synovial 11,700 (H) 0 - 200 /cu mm   Neutrophil, Synovial 92 (H) 0 - 25 %   Lymphocytes-Synovial Fld 6 0 - 20 %   Monocyte-Macrophage-Synovial Fluid 2 (L) 50 - 90 %   Eosinophils-Synovial 0 0 - 1 %  Aerobic/Anaerobic Culture (surgical/deep wound)     Status: None (Preliminary result)   Collection Time: 10/25/18 12:30 PM  Result Value Ref Range   Specimen Description SYNOVIAL RIGHT ELBOW    Special Requests NONE    Gram Stain      MODERATE WBC PRESENT, PREDOMINANTLY PMN NO ORGANISMS SEEN Performed at [facility], [address] [place], [place] [address]    Culture PENDING    Report Status PENDING   Protime-INR     Status: Abnormal   Collection Time: 10/26/18  4:08 AM  Result Value Ref Range   Prothrombin Time 17.4 (H) 11.4 - 15.2 seconds   INR 1.44   CBC with Differential/Platelet     Status: Abnormal   Collection Time: 10/26/18  4:08 AM  Result Value Ref Range   WBC 11.7 (H) 4.0 - 10.5 K/uL   RBC 3.19 (L) 4.22 - 5.81 MIL/uL   Hemoglobin 10.5 (L) 13.0 - 17.0 g/dL   HCT 19.132.8 (L) 47.839.0 - 29.552.0 %   MCV 102.8 (H) 80.0 - 100.0 fL   MCH 32.9 26.0 - 34.0 pg   MCHC 32.0 30.0 -  36.0 g/dL   RDW 48.1 85.6 - 31.4 %   Platelets 175 150 - 400 K/uL   nRBC 0.0 0.0 - 0.2 %   Neutrophils Relative % 76 %   Neutro Abs 9.0 (H) 1.7 - 7.7 K/uL   Lymphocytes Relative 9 %   Lymphs Abs 1.1 0.7 - 4.0 K/uL   Monocytes Relative 13 %   Monocytes Absolute 1.5 (H) 0.1 - 1.0 K/uL   Eosinophils Relative 1 %   Eosinophils Absolute 0.1 0.0 - 0.5 K/uL   Basophils Relative 0 %   Basophils Absolute 0.0 0.0 - 0.1 K/uL   Immature Granulocytes 1 %   Abs Immature Granulocytes 0.07 0.00 - 0.07 K/uL  Comprehensive metabolic panel     Status: Abnormal   Collection Time: 10/26/18  4:08 AM  Result Value Ref Range   Sodium 136 135 - 145 mmol/L   Potassium 4.0 3.5 - 5.1 mmol/L   Chloride 99 98 - 111 mmol/L   CO2 30 22 - 32 mmol/L   Glucose, Bld 103 (H) 70 - 99 mg/dL   BUN 25 (H) 8 - 23 mg/dL   Creatinine, Ser 9.70 0.61 - 1.24 mg/dL   Calcium 8.2 (L) 8.9 - 10.3 mg/dL   Total Protein 5.3 (L) 6.5 - 8.1 g/dL   Albumin 1.8 (L) 3.5 - 5.0 g/dL   AST 31 15 - 41 U/L   ALT 22 0 - 44 U/L   Alkaline Phosphatase 45 38 - 126 U/L   Total Bilirubin 0.7 0.3 - 1.2 mg/dL   GFR calc non Af Amer >60 >60 mL/min   GFR calc Af Amer >60 >60 mL/min   Anion gap 7 5 - 15   Magnesium     Status: None   Collection Time: 10/26/18  4:08 AM  Result Value Ref Range   Magnesium 1.8 1.7 - 2.4 mg/dL  Phosphorus     Status: None   Collection Time: 10/26/18  4:08 AM  Result Value Ref Range   Phosphorus 2.8 2.5 - 4.6 mg/dL     PHYSICAL EXAM:   Gen: sitting up in chair, cooperative  Ext:       Right upper extremity   Decreased effusion R elbow, tolerates gentle motion   Mild effusion R wrist, tolerates gentle motion   Motor and sensory functions intact  Ext warm   + Radial pulse  No signs of infection       Left Lower Extremity   Dressing L hip stable  Motor and sensory functions intact  Ext warm   + dp pusle  No DCT   Swelling stable   Assessment/Plan: 4 Days Post-Op   Principal Problem:   Left displaced femoral neck fracture (HCC) Active Problems:   ATRIAL FIBRILLATION   Essential hypertension, benign   Chronic pain syndrome   Memory loss   Protein-calorie malnutrition, severe   Pressure injury of skin   Anti-infectives (From admission, onward)   Start     Dose/Rate Route Frequency Ordered Stop   10/22/18 2000  vancomycin (VANCOCIN) IVPB 1000 mg/200 mL premix     1,000 mg 200 mL/hr over 60 Minutes Intravenous Every 12 hours 10/22/18 1213 10/22/18 2136   10/22/18 0745  vancomycin (VANCOCIN) IVPB 1000 mg/200 mL premix     1,000 mg 200 mL/hr over 60 Minutes Intravenous  Once 10/22/18 0741 10/23/18 0115   10/21/18 1300  ceFAZolin (ANCEF) IVPB 2g/100 mL premix  Status:  Discontinued     2 g 200  mL/hr over 30 Minutes Intravenous To [facility] 10/20/18 1932 10/21/18 0938   10/21/18 1015  ceFAZolin (ANCEF) IVPB 2g/100 mL premix     2 g 200 mL/hr over 30 Minutes Intravenous On call to O.R. 10/21/18 1006 10/22/18 0559   10/21/18 0800  ceFAZolin (ANCEF) IVPB 2g/100 mL premix  Status:  Discontinued     2 g 200 mL/hr over 30 Minutes Intravenous To [facility] 10/20/18 1924 10/20/18 1932    .  POD/HD#: 34  85 y/o male s/p fall  with L femoral neck fracture   - Left femoral neck fracture s/p L hip hemiarthroplasty   wbat l leg  Posterior hip precautions left hip   Dressing changes as needed at this point  Ok to bathe and clean wound with soap and water  Continue PT/OT   SNF   - R elbow and wrist inflammatory arthropathy---> CPPD/pseudogout  Continue to follow C&S but NGTD  Fluid analysis consistent with pseudogout  Would recommend corticosteroid taper instead of NSAIDs, defer to medical team    - Pain management:  Continue with current regimen   - ABL anemia/Hemodynamics  Stable   - Medical issues   Per medial team  - DVT/PE prophylaxis:  On apixaban due to afib   - ID:   periop abx completed  - Metabolic Bone Disease:  25 OH vitamin D in excellent range    43.8 ng/mL  Continue WB exercises  outpt DEXA   - Activity:  PT/TO   - Dispo:  Continue with therapy   SW eval for SNF      Mearl Latin, PA-C [phone] (C) 10/26/2018, 10:35 AM  [facility] [address] [place] [address] [phone] Collier Bullock (F)

## 2018-10-27 LAB — COMPREHENSIVE METABOLIC PANEL
ALT: 24 U/L (ref 0–44)
AST: 34 U/L (ref 15–41)
Albumin: 1.8 g/dL — ABNORMAL LOW (ref 3.5–5.0)
Alkaline Phosphatase: 48 U/L (ref 38–126)
Anion gap: 7 (ref 5–15)
BUN: 23 mg/dL (ref 8–23)
CHLORIDE: 98 mmol/L (ref 98–111)
CO2: 30 mmol/L (ref 22–32)
Calcium: 8.2 mg/dL — ABNORMAL LOW (ref 8.9–10.3)
Creatinine, Ser: 0.63 mg/dL (ref 0.61–1.24)
GFR calc Af Amer: >60 mL/min (ref >60)
GFR calc non Af Amer: >60 mL/min (ref >60)
Glucose, Bld: 103 mg/dL — ABNORMAL HIGH (ref 70–99)
POTASSIUM: 4.1 mmol/L (ref 3.5–5.1)
Sodium: 135 mmol/L (ref 135–145)
Total Bilirubin: 1 mg/dL (ref 0.3–1.2)
Total Protein: 5.1 g/dL — ABNORMAL LOW (ref 6.5–8.1)

## 2018-10-27 LAB — CBC WITH DIFFERENTIAL/PLATELET
Abs Immature Granulocytes: 0.09 10*3/uL — ABNORMAL HIGH (ref 0.00–0.07)
Basophils Absolute: 0 10*3/uL (ref 0.0–0.1)
Basophils Relative: 0 %
Eosinophils Absolute: 0 10*3/uL (ref 0.0–0.5)
Eosinophils Relative: 0 %
HCT: 32.2 % — ABNORMAL LOW (ref 39.0–52.0)
Hemoglobin: 10.4 g/dL — ABNORMAL LOW (ref 13.0–17.0)
IMMATURE GRANULOCYTES: 1 %
Lymphocytes Relative: 8 %
Lymphs Abs: 0.8 10*3/uL (ref 0.7–4.0)
MCH: 32.7 pg (ref 26.0–34.0)
MCHC: 32.3 g/dL (ref 30.0–36.0)
MCV: 101.3 fL — ABNORMAL HIGH (ref 80.0–100.0)
Monocytes Absolute: 1.2 10*3/uL — ABNORMAL HIGH (ref 0.1–1.0)
Monocytes Relative: 11 %
NEUTROS PCT: 80 %
Neutro Abs: 9 10*3/uL — ABNORMAL HIGH (ref 1.7–7.7)
PLATELETS: 215 10*3/uL (ref 150–400)
RBC: 3.18 MIL/uL — ABNORMAL LOW (ref 4.22–5.81)
RDW: 13.2 % (ref 11.5–15.5)
WBC: 11.1 10*3/uL — AB (ref 4.0–10.5)
nRBC: 0 % (ref 0.0–0.2)

## 2018-10-27 LAB — MAGNESIUM: Magnesium: 1.8 mg/dL (ref 1.7–2.4)

## 2018-10-27 LAB — PHOSPHORUS: Phosphorus: 2.9 mg/dL (ref 2.5–4.6)

## 2018-10-27 MED ORDER — METOPROLOL TARTRATE 25 MG PO TABS
25.0000 mg | ORAL_TABLET | Freq: Four times a day (QID) | ORAL | Status: DC
  Administered 2018-10-27 – 2018-10-30 (×13): 25 mg via ORAL
  Filled 2018-10-27 (×13): qty 1

## 2018-10-27 MED ORDER — SENNA 8.6 MG PO TABS
1.0000 | ORAL_TABLET | Freq: Every day | ORAL | Status: DC
  Administered 2018-10-27 – 2018-10-28 (×2): 8.6 mg via ORAL
  Filled 2018-10-27 (×2): qty 1

## 2018-10-27 MED ORDER — HYDROCODONE-ACETAMINOPHEN 10-325 MG PO TABS: 1.0000 | ORAL_TABLET | Freq: Three times a day (TID) | ORAL | Status: DC | PRN

## 2018-10-27 MED ORDER — POLYETHYLENE GLYCOL 3350 17 G PO PACK
17.0000 g | PACK | Freq: Every day | ORAL | Status: DC
  Administered 2018-10-27 – 2018-10-28 (×2): 17 g via ORAL
  Filled 2018-10-27 (×2): qty 1

## 2018-10-27 MED ORDER — ACETAMINOPHEN 500 MG PO TABS
1000.0000 mg | ORAL_TABLET | Freq: Three times a day (TID) | ORAL | Status: DC
  Administered 2018-10-27 – 2018-10-30 (×11): 1000 mg via ORAL
  Filled 2018-10-27 (×11): qty 2

## 2018-10-27 MED ORDER — BISACODYL 10 MG RE SUPP
10.0000 mg | Freq: Every day | RECTAL | Status: DC | PRN
  Administered 2018-10-28: 10 mg via RECTAL
  Filled 2018-10-27: qty 1
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
## 2018-10-27 NOTE — Progress Notes (Addendum)
Occupational Therapy Treatment Patient Details Name: [name] MRN: [id number] [name]: [birth year] Today's Date: 10/27/2018    History of present illness Pt is an 85 y.o. male admitted 10/20/18 after falling after scooter, now s/p  L hemiarthroplasty (WBAT; posterior hip preacautions). Pt with persistent R elbow pain since fall; xray shows potential radial head fx. PMH includes R knee pain, pacemaker, afib, chronic pain syndrome.   OT comments  Pt fatigues easily throughout session. OT and PT collaborating for bed mobility and pt with posterior and leaning to R side unable to sit upright without maxA. Pt maxA +2 for helicopter bed mobility supine to sitting EOB using pads. Pt not tolerating hips aligned to stand as pt resisting to sit upright. Pt performed simple R wrist ROM exercises and swelling in R arm continues, but less pain reported per chart. Pt with decreased muscle strength and poor activity tolerance. Pt continues to be near totalA for ADL. OT to follow acutely.   Follow Up Recommendations  SNF;Supervision/Assistance - 24 hour    Equipment Recommendations  3 in 1 bedside commode    Recommendations for Other Services      Precautions / Restrictions Precautions Precautions: Fall;Posterior Hip Precaution Comments: educated about precautions; assumed posterior precautions as pt in L knee immobilizer following surgery.  Restrictions Weight Bearing Restrictions: Yes LLE Weight Bearing: Weight bearing as tolerated Other Position/Activity Restrictions: Can use and move RUE as tolerated per Dr. Carola Frost on chat message 10/26/2018       Mobility Bed Mobility Overal bed mobility: Needs Assistance Bed Mobility: Supine to Sit;Sit to Supine;Rolling Rolling: Max assist Sidelying to sit: Total assist;+2 for physical assistance Supine to sit: Max assist;+2 for physical assistance Sit to supine: Max assist;+2 for physical assistance   General bed mobility comments: hellicopter with pad  to sit EOB maxA + 2; Once EOB pt maxA to sit upright and avoid leaning posteriorly and to right requiring OTR behind pt .  Transfers Overall transfer level: Needs assistance               General transfer comment: DNT- EOB sitting balance poor    Balance Overall balance assessment: Needs assistance;History of Falls Sitting-balance support: Single extremity supported Sitting balance-Leahy Scale: Poor Sitting balance - Comments: Posterior and R-lateral lean requiring maxA to maintain upright posture; pt reliant on LUE support to pull trunk forward to upright, unable to initiate correction of R-lateral lean without assist                                   ADL either performed or assessed with clinical judgement   ADL Overall ADL's : Needs assistance/impaired                                     Functional mobility during ADLs: Maximal assistance;+2 for physical assistance(sitting EOB) General ADL Comments: Pt remains maxA to totalA for ADL sitting EOB as pt starting to move RUE more, but continues to be limited by balance     Vision   Vision Assessment?: No apparent visual deficits   Perception     Praxis      Cognition Arousal/Alertness: Awake/alert Behavior During Therapy: Flat affect Overall Cognitive Status: Impaired/Different from baseline Area of Impairment: Attention;Memory;Following commands;Safety/judgement;Awareness;Problem solving  Current Attention Level: Sustained Memory: Decreased short-term memory Following Commands: Follows one step commands with increased time Safety/Judgement: Decreased awareness of safety;Decreased awareness of deficits Awareness: Emergent Problem Solving: Slow processing;Difficulty sequencing          Exercises Other Exercises Other Exercises: Assist for bilateral knee flexion/extension while sitting; assist for LLE LAQ, but pt able to control descent into flexion    Shoulder Instructions       General Comments Wife present at beginning of session. SpO2 94% on 2L O2 [name]. HR 100-130s. Orthostatic hypotension upon sitting (BP down to 88/77), pt asymptomatic    Pertinent Vitals/ Pain       Pain Assessment: Faces Faces Pain Scale: Hurts even more Pain Location: R knee with flexion; RUE with WB Pain Descriptors / Indicators: Moaning;Discomfort;Guarding;Grimacing Pain Intervention(s): Limited activity within patient's tolerance  Home Living                                          Prior Functioning/Environment              Frequency  Min 3X/week        Progress Toward Goals  OT Goals(current goals can now be found in the care plan section)  Progress towards OT goals: Progressing toward goals  Acute Rehab OT Goals Patient Stated Goal: Agreeable to rehab before return home OT Goal Formulation: With patient Time For Goal Achievement: 11/06/18 Potential to Achieve Goals: Fair ADL Goals Pt Will Perform Grooming: with set-up;sitting Pt Will Perform Upper Body Dressing: with set-up;sitting Pt Will Perform Lower Body Dressing: with min assist;with adaptive equipment Pt Will Transfer to Toilet: with min assist;bedside commode Additional ADL Goal #1: Pt will perform ADL functional transfers with MinA Additional ADL Goal #2: Pt's wife will be independent in A'ing pt with AAROM of RUE to prevent contractures.  Plan Discharge plan remains appropriate    Co-evaluation    PT/OT/SLP Co-Evaluation/Treatment: Yes Reason for Co-Treatment: To address functional/ADL transfers PT goals addressed during session: Mobility/safety with mobility OT goals addressed during session: Strengthening/ROM      AM-PAC OT "6 Clicks" Daily Activity     Outcome Measure   Help from another person eating meals?: Total Help from another person taking care of personal grooming?: Total Help from another person toileting, which includes using  toliet, bedpan, or urinal?: Total Help from another person bathing (including washing, rinsing, drying)?: Total Help from another person to put on and taking off regular upper body clothing?: Total Help from another person to put on and taking off regular lower body clothing?: Total 6 Click Score: 6    End of Session Equipment Utilized During Treatment: Gait belt  OT Visit Diagnosis: Unsteadiness on feet (R26.81);Muscle weakness (generalized) (M62.81);Repeated falls (R29.6);Pain Pain - Right/Left: Left Pain - part of body: Hip   Activity Tolerance Patient limited by fatigue;Patient limited by pain   Patient Left in bed;with bed alarm set;with family/visitor present;with call bell/phone within reach   Nurse Communication Mobility status;Need for lift equipment        Time: 1510-1535 OT Time Calculation (min): 25 min  Charges: OT General Charges $OT Visit: 1 Visit OT Treatments $Neuromuscular Re-education: 8-22 mins  Cristi Loron) Glendell Docker OTR/L Acute Rehabilitation Services Pager: [phone] Office: [phone]    Sandrea Hughs 10/27/2018, 4:44 PM

## 2018-10-27 NOTE — Progress Notes (Addendum)
Progress Note  Patient Name: [name] Date of Encounter: 10/27/2018  Primary Cardiologist: Hillis Range, MD   Subjective   Patient feeling better today.  Still in RVR, tolerating the rhythm well.  Inpatient Medications    Scheduled Meds: . acetaminophen  1,000 mg Oral TID  . apixaban  5 mg Oral BID  . feeding supplement (ENSURE ENLIVE)  237 mL Oral TID BM  . metoprolol tartrate  12.5 mg Oral Q6H  . multivitamin with minerals  1 tablet Oral Daily  . pantoprazole  40 mg Oral Daily  . polyethylene glycol  17 g Oral Daily  . predniSONE  40 mg Oral Q breakfast  . senna  1 tablet Oral Daily   Continuous Infusions: . amiodarone 30 mg/hr (10/27/18 0635)  . lactated ringers 10 mL/hr at 10/22/18 0747  . methocarbamol (ROBAXIN) IV     PRN Meds: bisacodyl, HYDROcodone-acetaminophen, menthol-cetylpyridinium **OR** phenol, methocarbamol **OR** methocarbamol (ROBAXIN) IV, morphine injection, ondansetron (ZOFRAN) IV   Vital Signs    Vitals:   10/27/18 0319 10/27/18 0433 10/27/18 0802 10/27/18 0822  BP: 98/80 105/77 107/75   Pulse: (!) 133  (!) 137 (!) 135  Resp:   (!) 28   Temp:  98.4 F (36.9 C) 98.6 F (37 C)   TempSrc:   Oral   SpO2:   96%   Weight:  65.7 kg    Height:        Intake/Output Summary (Last 24 hours) at 10/27/2018 1042 Last data filed at 10/27/2018 0933 Gross per 24 hour  Intake 803.2 ml  Output 1200 ml  Net -396.8 ml   Last 3 Weights 10/27/2018 10/26/2018 10/25/2018  Weight (lbs) 144 lb 13.5 oz 142 lb 13.7 oz 140 lb 14 oz  Weight (kg) 65.7 kg 64.8 kg 63.9 kg      Telemetry    A. fib with RVR- Personally Reviewed  ECG     - Personally Reviewed  Physical Exam   GEN: No acute distress.   Neck: No JVD Cardiac: irregular RR, no murmurs, rubs, or gallops.  Respiratory:  Respirations unlabored GI: Soft, nontender, non-distended  MS: No deformity. Neuro:  Nonfocal  Psych: Normal affect   Labs    Chemistry Recent Labs  Lab  10/25/18 0513 10/26/18 0408 10/27/18 0333  NA 137 136 135  K 3.6 4.0 4.1  CL 102 99 98  CO2 29 30 30   GLUCOSE 102* 103* 103*  BUN 22 25* 23  CREATININE 0.60* 0.74 0.63  CALCIUM 7.8* 8.2* 8.2*  PROT 4.5* 5.3* 5.1*  ALBUMIN 1.8* 1.8* 1.8*  AST 27 31 34  ALT 18 22 24   ALKPHOS 37* 45 48  BILITOT 0.6 0.7 1.0  GFRNONAA >60 >60 >60  GFRAA >60 >60 >60  ANIONGAP 6 7 7      Hematology Recent Labs  Lab 10/25/18 0513 10/26/18 0408 10/27/18 0333  WBC 9.3 11.7* 11.1*  RBC 3.15*  3.15* 3.19* 3.18*  HGB 10.4* 10.5* 10.4*  HCT 32.1* 32.8* 32.2*  MCV 101.9* 102.8* 101.3*  MCH 33.0 32.9 32.7  MCHC 32.4 32.0 32.3  RDW 13.2 13.3 13.2  PLT 139* 175 215    Cardiac EnzymesNo results for input(s): TROPONINI in the last 168 hours. No results for input(s): TROPIPOC in the last 168 hours.   BNPNo results for input(s): BNP, PROBNP in the last 168 hours.   DDimer No results for input(s): DDIMER in the last 168 hours.   Radiology    No results  found.  Cardiac Studies   none  Patient Profile     85 y.o. male with hx of PAF, atrial tachycardia, prior PPM seen for preop evaluation prior to repair of hip fracture. Pt had hip surgery 10/22/18. Now with right elbow   Assessment & Plan    1.  History of PAF, admitted with A. fib with RVR postop -History of PPM, was 30% A. fib on 09/07/2018 -After sotalol washout he was started on amiodarone -Lopressor 12.5 mg twice daily did not control his rate -Patient has a chads vas score of 2 for age -Has been on Eliquis since 2/16 and is tolerating this well, no bleeding -He is still not rate controlled on the IV amiodarone, Lopressor at 12.5 mg every 6 hours -We will increase to 25 mg Lopressor every 6 hours -He is tolerating the rate and rhythm well, but needs better heart rate control --We will obtain echocardiogram, will discuss with attending if this needs to be done inpatient -not yet ordered   2. iron deficiency anemia -Hemoglobin  stable    3. Elbow pain - pseudogout per ortho   4. Left hip fracture s/p surgical repair - per ortho   5.  PPM in place, followed by EP      For questions or updates, please contact [facility] Please consult [email or web address] for contact info under     Signed, Marcelino Duster, PA  10/27/2018, 10:42 AM    Patient seen and examined. Agree with assessment and plan.  Atrial fibrillation rate is beginning to slightly slowed down.  Now ranging from 1 10-1 30.  Dose frequency of metoprolol was increased to every 6 hours yesterday and today the dose was increased to 25 mg.  Dissipate by tomorrow there should be much improved rate control.  He continues to be on amiodarone 30 mg/h drip.  Will scheduled for 2D echo Doppler study.   Lennette Bihari, MD, [facility] 10/27/2018 1:09 PM

## 2018-10-27 NOTE — Progress Notes (Signed)
PROGRESS NOTE   PRINCE CASTIGLIONI  VUY:[id number]    [name]: 09-20-1928    DOA: 10/20/2018  PCP: [name], MD   I have briefly reviewed patients previous medical records in [facility].  Brief Narrative:  85 year old married male, lives with spouse, moved around at home with the help of electric wheelchair and transferred with walker, PMH of PAF on Coumadin and sotalol PTA, tachybradycardia syndrome status post PPM, GERD, presented to [facility] ED on 10/20/2018 following a fall at home and noted to have left proximal femoral neck fracture.  Orthopedics consulted and he is status post left hip hemiarthroplasty.Hospital course complicated by A. fib with persistent RVR, cardiology assisting, was on IV amiodarone infusion and oral metoprolol and with right elbow acute pseudogout confirmed by arthrocentesis.   Assessment & Plan:   Principal Problem:   Left displaced femoral neck fracture (HCC) Active Problems:   ATRIAL FIBRILLATION   Essential hypertension, benign   Chronic pain syndrome   Memory loss   Protein-calorie malnutrition, severe   Pressure injury of skin   1. Left displaced femoral neck fracture: Sustained status post mechanical fall at home.  After cardiology assisted with controlling A. fib with RVR, orthopedics performed left hip hemiarthroplasty on 2/14.As per orthopedic follow-up 2/18: WBAT left leg, posterior hip precautions, dressing changes as needed. 2. A. fib with RVR/PPM: Has persistent RVR here.  History of PPM, was 30% A. fib on 09/07/2018.  Cardiology was consulted.  After sotalol washout, he was started on amiodarone infusion, metoprolol adjusted today to 25 mg every 6 hourly, was on Coumadin PTA, now on Eliquis since 2/16.  Checking TTE. 3. Right elbow and wrist acute pseudogout: Orthopedics performed diagnostic arthrocentesis which showed CPPD crystals, culture negative to date.  Continue oral prednisone.  Monitor closely. 4. Essential hypertension: Soft blood  pressures but asymptomatic of same.  Monitor closely while on metoprolol 25 mg every 6 hourly. 5. Chronic pain/DJD/DDD: Controlled except for right elbow and wrist pain due to acute pseudogout.  Pain management. 6. GERD: Continue PPI. 7. Dementia: Suspect at least mild dementia with memory impairment. 8. Leukocytosis: Mild.  No clinical concern for infectious etiology.  Suspected will go up due to prednisone. 9. Thrombocytopenia: Resolved. 10. Postop acute blood loss anemia: Hemoglobin stable in the 10 g range for the last 3 days.  Hemoglobin was 13.4 on admission.  Anemia panel: Iron 7, TIBC 136, saturation ratio 5, ferritin 204, folate 22, B12: 188. 11. Hypokalemia: Replaced.  Magnesium normal. 12. Hypophosphatemia: Replaced. 13. Severe malnutrition in the context of chronic illness: Management per dietitian input. 14. Fever: Noted on 2/16 and 2/17.  Suspected due to acute pseudogout.  Cultures negative and fevers have subsided.   DVT prophylaxis: Apixaban Code Status: Full Family Communication: Discussed in detail with patient spouse at bedside Disposition: DC to SNF pending clinical improvement   Consultants:  Orthopedics Cardiology  Procedures:  Left hip hemiarthroplasty 2/14 Right elbow arthrocentesis  Antimicrobials:  None   Subjective: Patient interviewed and examined this morning in the presence of his spouse and RN at bedside.  Some right elbow and wrist pain but better than prior.  No left hip pain.  No chest pain, palpitations, dizziness or lightheadedness  ROS: As above, otherwise poor historian due to memory impairment.  Objective:  Vitals:   10/27/18 0822 10/27/18 1150 10/27/18 1401 10/27/18 1556  BP:  95/70 100/64 95/79  Pulse: (!) 135 (!) 121 (!) 110 (!) 107  Resp:  (!) 21  Marland Kitchen)  21  Temp:  97.8 F (36.6 C)  99 F (37.2 C)  TempSrc:  Axillary  Axillary  SpO2:  97%  100%  Weight:      Height:        Examination:  General exam: Pleasant elderly male,  moderately built and frail, lying comfortably propped up in bed.  Oral mucosa moist. Respiratory system: Clear to auscultation. Respiratory effort normal. Cardiovascular system: S1 & S2 heard, irregularly irregular and tachycardic. No JVD, murmurs, rubs, gallops or clicks. No pedal edema.  Telemetry personally reviewed this morning: A. fib with RVR in the 130s consistently. Gastrointestinal system: Abdomen is nondistended, soft and nontender. No organomegaly or masses felt. Normal bowel sounds heard. Central nervous system: Alert and oriented x2. No focal neurological deficits. Extremities: Symmetric 5 x 5 power.  Right upper extremity movements restricted due to pain.  Mild swelling and warmth of right elbow, mild warmth of right wrist and both these joints were mildly tender and painful range of movements.  Left hip postop dressing clean and dry. Skin: No rashes, lesions or ulcers Psychiatry: Judgement and insight appear impaired. Mood & affect appropriate.     Data Reviewed: I have personally reviewed following labs and imaging studies  CBC: Recent Labs  Lab 10/23/18 0619 10/24/18 0441 10/25/18 0513 10/26/18 0408 10/27/18 0333  WBC 10.8* 9.7 9.3 11.7* 11.1*  NEUTROABS 8.2* 7.4 6.6 9.0* 9.0*  HGB 12.1* 11.8* 10.4* 10.5* 10.4*  HCT 37.6* 35.5* 32.1* 32.8* 32.2*  MCV 101.3* 101.7* 101.9* 102.8* 101.3*  PLT 121* 136* 139* 175 215   Basic Metabolic Panel: Recent Labs  Lab 10/23/18 0619 10/24/18 0441 10/25/18 0513 10/26/18 0408 10/27/18 0333  NA 134* 136 137 136 135  K 4.3 3.7 3.6 4.0 4.1  CL 103 102 102 99 98  CO2 27 27 29 30 30   GLUCOSE 95 107* 102* 103* 103*  BUN 21 22 22  25* 23  CREATININE 0.79 0.61 0.60* 0.74 0.63  CALCIUM 8.0* 7.9* 7.8* 8.2* 8.2*  MG 1.7 1.9 1.8 1.8 1.8  PHOS 3.0 1.9* 2.8 2.8 2.9   Liver Function Tests: Recent Labs  Lab 10/23/18 0619 10/24/18 0441 10/25/18 0513 10/26/18 0408 10/27/18 0333  AST 31 28 27 31  34  ALT 19 18 18 22 24   ALKPHOS 37*  44 37* 45 48  BILITOT 1.8* 1.3* 0.6 0.7 1.0  PROT 5.1* 5.2* 4.5* 5.3* 5.1*  ALBUMIN 2.4* 2.2* 1.8* 1.8* 1.8*   Coagulation Profile: Recent Labs  Lab 10/22/18 0407 10/23/18 0619 10/24/18 0441 10/25/18 0513 10/26/18 0408  INR 1.32 1.36 1.23 1.56 1.44     Recent Results (from the past 240 hour(s))  MRSA PCR Screening     Status: Abnormal   Collection Time: 10/20/18  6:54 PM  Result Value Ref Range Status   MRSA by PCR POSITIVE (A) NEGATIVE Final    Comment:        The GeneXpert MRSA Assay (FDA approved for NASAL specimens only), is one component of a comprehensive MRSA colonization surveillance program. It is not intended to diagnose MRSA infection nor to guide or monitor treatment for MRSA infections. RESULT CALLED TO, READ BACK BY AND VERIFIED WITH: Darcella Cheshire MARTIN RN 2209 10/20/18 A BROWNING Performed at [facility], [address] [place], [place] [address]   Culture, blood (routine x 2)     Status: None (Preliminary result)   Collection Time: 10/25/18  9:05 AM  Result Value Ref Range Status   Specimen Description BLOOD  LEFT ANTECUBITAL  Final   Special Requests   Final    BOTTLES DRAWN AEROBIC ONLY Blood Culture adequate volume   Culture   Final    NO GROWTH 2 DAYS Performed at [facility], [address], [place], [place] [address]    Report Status PENDING  Incomplete  Culture, blood (routine x 2)     Status: None (Preliminary result)   Collection Time: 10/25/18  9:11 AM  Result Value Ref Range Status   Specimen Description BLOOD LEFT HAND  Final   Special Requests   Final    BOTTLES DRAWN AEROBIC ONLY Blood Culture adequate volume   Culture   Final    NO GROWTH 2 DAYS Performed at [facility], [address], [place], [place] [address]    Report Status PENDING  Incomplete  Aerobic/Anaerobic Culture (surgical/deep wound)     Status: None (Preliminary result)   Collection Time: 10/25/18 12:30 PM  Result Value Ref Range Status   Specimen  Description SYNOVIAL RIGHT ELBOW  Final   Special Requests NONE  Final   Gram Stain   Final    MODERATE WBC PRESENT, PREDOMINANTLY PMN NO ORGANISMS SEEN    Culture   Final    NO GROWTH 2 DAYS NO ANAEROBES ISOLATED; CULTURE IN PROGRESS FOR 5 DAYS Performed at [facility], [address], [place], [place] [address]    Report Status PENDING  Incomplete         Radiology Studies: No results found.      Scheduled Meds: . acetaminophen  1,000 mg Oral TID  . apixaban  5 mg Oral BID  . feeding supplement (ENSURE ENLIVE)  237 mL Oral TID BM  . metoprolol tartrate  25 mg Oral Q6H  . multivitamin with minerals  1 tablet Oral Daily  . pantoprazole  40 mg Oral Daily  . polyethylene glycol  17 g Oral Daily  . predniSONE  40 mg Oral Q breakfast  . senna  1 tablet Oral Daily   Continuous Infusions: . amiodarone 30 mg/hr (10/27/18 1759)  . lactated ringers 10 mL/hr at 10/22/18 0747  . methocarbamol (ROBAXIN) IV       LOS: 7 days     Marcellus Scott, MD, FACP, [facility]. [facility]  To contact the attending provider between 7A-7P or the covering provider during after hours 7P-7A, please log into the web site [email or web address] and access using universal [place] password for that web site. If you do not have the password, please call the hospital operator.  10/27/2018, 6:15 PM

## 2018-10-27 NOTE — Progress Notes (Signed)
Nutrition Follow-up  DOCUMENTATION CODES:   Severe malnutrition in context of chronic illness  INTERVENTION:    Continue Ensure Enlive po TID, each supplement provides 350 kcal and 20 grams of protein  Continue MVI daily  NUTRITION DIAGNOSIS:   Severe Malnutrition related to chronic illness(CAD) as evidenced by severe fat depletion, severe muscle depletion.  Ongoing   GOAL:   Patient will meet greater than or equal to 90% of their needs  Progressing   MONITOR:   PO intake, Supplement acceptance, Labs, Skin  ASSESSMENT:   85 yo male with PMH of PAF, tachy-brady syndrome, pacemaker, spinal stenosis, DJD, DDD, scoliosis who was admitted with L femoral neck fracture s/p mechanical fall.   S/P L hip hemiarthroplasty on 2/14. Patient with fair intake of regular diet; consumed 25-75% of meals yesterday.   He is being offered Ensure Enlive TID, drinking 2-3 supplements per day.  Cardiology working on adjusting medications for better heart rate control prior d/c to SNF.  Labs and medications reviewed.    Diet Order:   Diet Order            Diet regular Room service appropriate? Yes; Fluid consistency: Thin  Diet effective now              EDUCATION NEEDS:   No education needs have been identified at this time  Skin:  Skin Assessment: Skin Integrity Issues: Skin Integrity Issues:: Stage I, Other (Comment) Stage I: coccyx Other: MASD to buttocks  Last BM:  2/16  Height:   Ht Readings from Last 1 Encounters:  10/22/18 5\' 6"  (1.676 m)    Weight:   Wt Readings from Last 1 Encounters:  10/27/18 65.7 kg    Ideal Body Weight:  64.5 kg  BMI:  Body mass index is 23.38 kg/m.  Estimated Nutritional Needs:   Kcal:  1700-1900  Protein:  85-100 gm  Fluid:  1.8 L    Joaquin Courts, RD, LDN, CNSC Pager [phone] After Hours Pager [phone]

## 2018-10-28 ENCOUNTER — Inpatient Hospital Stay (HOSPITAL_COMMUNITY): Payer: Medicare Other

## 2018-10-28 DIAGNOSIS — I340 Nonrheumatic mitral (valve) insufficiency: Secondary | ICD-10-CM

## 2018-10-28 DIAGNOSIS — I4819 Other persistent atrial fibrillation: Secondary | ICD-10-CM

## 2018-10-28 DIAGNOSIS — M1189 Other specified crystal arthropathies, multiple sites: Secondary | ICD-10-CM

## 2018-10-28 DIAGNOSIS — I361 Nonrheumatic tricuspid (valve) insufficiency: Secondary | ICD-10-CM

## 2018-10-28 LAB — ECHOCARDIOGRAM COMPLETE
Height: 66 in
Weight: 2373.91 oz

## 2018-10-28 MED ORDER — AMIODARONE HCL 200 MG PO TABS
400.0000 mg | ORAL_TABLET | Freq: Two times a day (BID) | ORAL | Status: DC
  Administered 2018-10-28 – 2018-10-29 (×2): 400 mg via ORAL
  Filled 2018-10-28 (×3): qty 2

## 2018-10-28 MED ORDER — POLYETHYLENE GLYCOL 3350 17 G PO PACK
17.0000 g | PACK | Freq: Two times a day (BID) | ORAL | Status: DC
  Administered 2018-10-28 – 2018-10-29 (×3): 17 g via ORAL
  Filled 2018-10-28 (×4): qty 1

## 2018-10-28 MED ORDER — SENNA 8.6 MG PO TABS
2.0000 | ORAL_TABLET | Freq: Every day | ORAL | Status: DC
  Administered 2018-10-29 – 2018-10-30 (×2): 17.2 mg via ORAL
  Filled 2018-10-28 (×2): qty 2

## 2018-10-28 NOTE — Progress Notes (Signed)
PROGRESS NOTE   [name]  BEE:[id number]    [name]: 03-07-1929    DOA: 10/20/2018  PCP: [name], MD   I have briefly reviewed patients previous medical records in [facility].  Brief Narrative:  85 year old married male, lives with spouse, moved around at home with the help of electric wheelchair and transferred with walker, PMH of PAF on Coumadin and sotalol PTA, tachybradycardia syndrome status post PPM, GERD, presented to [facility] ED on 10/20/2018 following a fall at home and noted to have left proximal femoral neck fracture.  Orthopedics consulted and he is status post left hip hemiarthroplasty.Hospital course complicated by A. fib with persistent RVR, cardiology assisting, was on IV amiodarone infusion and oral metoprolol and with right elbow acute pseudogout confirmed by arthrocentesis.   Assessment & Plan:   Principal Problem:   Left displaced femoral neck fracture (HCC) Active Problems:   ATRIAL FIBRILLATION   Essential hypertension, benign   Chronic pain syndrome   Memory loss   Protein-calorie malnutrition, severe   Pressure injury of skin   1. Left displaced femoral neck fracture: Sustained status post mechanical fall at home.  After cardiology assisted with controlling A. fib with RVR, orthopedics performed left hip hemiarthroplasty on 2/14.As per orthopedic follow-up 2/18: WBAT left leg, posterior hip precautions, dressing changes as needed.  Outpatient follow-up with orthopedics in 1 to 2 weeks.  Mobilize with PT. 2. A. fib with RVR/PPM: Has persistent RVR here.  History of PPM, was 30% A. fib on 09/07/2018.  Cardiology was consulted.  After sotalol washout, he was started on amiodarone infusion, metoprolol increased on 2/19 to 25 mg every 6 hourly, was on Coumadin PTA, now on Eliquis since 2/16.  Rate much better controlled-mostly in the low 100s.  Await cardiology follow-up, likely to switch amiodarone infusion to p.o. load and consolidate metoprolol to  Toprol-XL.  Follow TTE results. 3. Right elbow and wrist acute pseudogout: Orthopedics performed diagnostic arthrocentesis which showed CPPD crystals, culture negative to date.  Continue oral prednisone.  Continues to steadily improve.  Orthopedics follow-up appreciated. 4. Essential hypertension: Controlled.  Monitor closely while on metoprolol 25 mg every 6 hourly. 5. Chronic pain/DJD/DDD: Controlled except for right elbow and wrist pain due to acute pseudogout.  Pain management. 6. GERD: Continue PPI. 7. Dementia: Suspect at least mild dementia with memory impairment. 8. Leukocytosis: Mild.  No clinical concern for infectious etiology.  Suspected will go up due to prednisone. 9. Thrombocytopenia: Resolved. 10. Postop acute blood loss anemia: Hemoglobin stable in the 10 g range for the last 3 days.  Hemoglobin was 13.4 on admission.  Anemia panel: Iron 7, TIBC 136, saturation ratio 5, ferritin 204, folate 22, B12: 188.  Periodically follow CBCs. 11. Hypokalemia: Replaced.  Magnesium normal. 12. Hypophosphatemia: Replaced. 13. Severe malnutrition in the context of chronic illness: Management per dietitian input. 14. Fever: Noted on 2/16 and 2/17.  Suspected due to acute pseudogout.  Cultures negative and fevers have subsided. 15. Constipation: Initiated bowel regimen.   DVT prophylaxis: Apixaban Code Status: Full Family Communication: Discussed in detail with patient spouse and son-in-law at bedside, updated care and answered questions. Disposition: DC to SNF pending clinical improvement, hopefully in the next 1 to 2 days and after cardiology clearance.   Consultants:  Orthopedics Cardiology  Procedures:  Left hip hemiarthroplasty 2/14 Right elbow arthrocentesis  Antimicrobials:  None   Subjective: Overall feels much better.  Right elbow and wrist pain significantly improved and able to move  better.  Did not complain of left hip pain.  Last BM 2/16.  Passing flatus.  ROS: As  above, otherwise poor historian due to memory impairment.  Objective:  Vitals:   10/28/18 0524 10/28/18 0532 10/28/18 0622 10/28/18 1252  BP: 103/77  110/73 108/62  Pulse: 89   (!) 120  Resp: 19     Temp: 98.2 F (36.8 C)     TempSrc: Oral     SpO2:      Weight:  67.3 kg    Height:        Examination:  General exam: Pleasant elderly male, moderately built and frail, lying comfortably propped up in bed.  Oral mucosa moist. Respiratory system: Clear to auscultation.  No increased work of breathing. Cardiovascular system: S1 & S2 heard, irregularly irregular and tachycardic. No JVD, murmurs, rubs, gallops or clicks. No pedal edema.  Telemetry personally reviewed: A. fib with ventricular rate controlled in the low 100s. Gastrointestinal system: Abdomen is nondistended, soft and nontender. No organomegaly or masses felt. Normal bowel sounds heard.  Stable without change. Central nervous system: Alert and oriented x2. No focal neurological deficits.  Stable.  Extremities: Symmetric 5 x 5 power. Left hip postop dressing clean and dry.  No significant warmth or swelling of right elbow or wrist.  Mildly painful range of movements of right elbow >right wrist. Skin: No rashes, lesions or ulcers Psychiatry: Judgement and insight appear impaired. Mood & affect appropriate.     Data Reviewed: I have personally reviewed following labs and imaging studies  CBC: Recent Labs  Lab 10/23/18 0619 10/24/18 0441 10/25/18 0513 10/26/18 0408 10/27/18 0333  WBC 10.8* 9.7 9.3 11.7* 11.1*  NEUTROABS 8.2* 7.4 6.6 9.0* 9.0*  HGB 12.1* 11.8* 10.4* 10.5* 10.4*  HCT 37.6* 35.5* 32.1* 32.8* 32.2*  MCV 101.3* 101.7* 101.9* 102.8* 101.3*  PLT 121* 136* 139* 175 215   Basic Metabolic Panel: Recent Labs  Lab 10/23/18 0619 10/24/18 0441 10/25/18 0513 10/26/18 0408 10/27/18 0333  NA 134* 136 137 136 135  K 4.3 3.7 3.6 4.0 4.1  CL 103 102 102 99 98  CO2 27 27 29 30 30   GLUCOSE 95 107* 102* 103* 103*   BUN 21 22 22  25* 23  CREATININE 0.79 0.61 0.60* 0.74 0.63  CALCIUM 8.0* 7.9* 7.8* 8.2* 8.2*  MG 1.7 1.9 1.8 1.8 1.8  PHOS 3.0 1.9* 2.8 2.8 2.9   Liver Function Tests: Recent Labs  Lab 10/23/18 0619 10/24/18 0441 10/25/18 0513 10/26/18 0408 10/27/18 0333  AST 31 28 27 31  34  ALT 19 18 18 22 24   ALKPHOS 37* 44 37* 45 48  BILITOT 1.8* 1.3* 0.6 0.7 1.0  PROT 5.1* 5.2* 4.5* 5.3* 5.1*  ALBUMIN 2.4* 2.2* 1.8* 1.8* 1.8*   Coagulation Profile: Recent Labs  Lab 10/22/18 0407 10/23/18 0619 10/24/18 0441 10/25/18 0513 10/26/18 0408  INR 1.32 1.36 1.23 1.56 1.44     Recent Results (from the past 240 hour(s))  MRSA PCR Screening     Status: Abnormal   Collection Time: 10/20/18  6:54 PM  Result Value Ref Range Status   MRSA by PCR POSITIVE (A) NEGATIVE Final    Comment:        The GeneXpert MRSA Assay (FDA approved for NASAL specimens only), is one component of a comprehensive MRSA colonization surveillance program. It is not intended to diagnose MRSA infection nor to guide or monitor treatment for MRSA infections. RESULT CALLED TO, READ BACK BY AND VERIFIED WITH:  Darcella Cheshire MARTIN RN 2209 10/20/18 A BROWNING Performed at [facility], [address] [place], [place] [address]   Culture, blood (routine x 2)     Status: None (Preliminary result)   Collection Time: 10/25/18  9:05 AM  Result Value Ref Range Status   Specimen Description BLOOD LEFT ANTECUBITAL  Final   Special Requests   Final    BOTTLES DRAWN AEROBIC ONLY Blood Culture adequate volume   Culture   Final    NO GROWTH 3 DAYS Performed at [facility], [address] [place], [place] [address]    Report Status PENDING  Incomplete  Culture, blood (routine x 2)     Status: None (Preliminary result)   Collection Time: 10/25/18  9:11 AM  Result Value Ref Range Status   Specimen Description BLOOD LEFT HAND  Final   Special Requests   Final    BOTTLES DRAWN AEROBIC ONLY Blood Culture adequate volume    Culture   Final    NO GROWTH 3 DAYS Performed at [facility], [address] [place], [place] [address]    Report Status PENDING  Incomplete  Aerobic/Anaerobic Culture (surgical/deep wound)     Status: None (Preliminary result)   Collection Time: 10/25/18 12:30 PM  Result Value Ref Range Status   Specimen Description SYNOVIAL RIGHT ELBOW  Final   Special Requests NONE  Final   Gram Stain   Final    MODERATE WBC PRESENT, PREDOMINANTLY PMN NO ORGANISMS SEEN    Culture   Final    NO GROWTH 3 DAYS NO ANAEROBES ISOLATED; CULTURE IN PROGRESS FOR 5 DAYS Performed at [facility], [address] [place], [place] [address]    Report Status PENDING  Incomplete         Radiology Studies: No results found.      Scheduled Meds: . acetaminophen  1,000 mg Oral TID  . apixaban  5 mg Oral BID  . feeding supplement (ENSURE ENLIVE)  237 mL Oral TID BM  . metoprolol tartrate  25 mg Oral Q6H  . multivitamin with minerals  1 tablet Oral Daily  . pantoprazole  40 mg Oral Daily  . polyethylene glycol  17 g Oral Daily  . predniSONE  40 mg Oral Q breakfast  . senna  1 tablet Oral Daily   Continuous Infusions: . amiodarone 30 mg/hr (10/28/18 0631)  . lactated ringers 10 mL/hr at 10/22/18 0747     LOS: 8 days     Marcellus ScottAnand , MD, [place]FACP, [facility]. [facility]  To contact the attending provider between 7A-7P or the covering provider during after hours 7P-7A, please log into the web site [email or web address] and access using universal Esp[place] password for that web site. If you do not have the password, please call the hospital operator.  10/28/2018, 1:27 PM

## 2018-10-28 NOTE — Care Management Note (Signed)
Case Management Note  Patient Details  Name: [name] MRN: [id number] Date of Birth: 1929/06/12  Subjective/Objective: Transfer from [place] new onset Atrial Fib. S/p Femur Fracture. Patient continues on IV Amio gtt. CSW following for SNF [facility] once stable to transition.                    Action/Plan: CM will continue to monitor for additional transition of care needs.   Expected Discharge Date:                  Expected Discharge Plan:  Skilled Nursing Facility  In-House Referral:  Clinical Social Work  Discharge planning Services  CM Consult  Post Acute Care Choice:  NA Choice offered to:  NA  DME Arranged:  N/A DME Agency:  NA  HH Arranged:  NA HH Agency:  NA  Status of Service:  Completed, signed off  If discussed at Long Length of Stay Meetings, dates discussed:    Additional Comments:  Gala Lewandowsky, RN 10/28/2018, 10:54 AM

## 2018-10-28 NOTE — Progress Notes (Signed)
  Echocardiogram 2D Echocardiogram has been performed.  [name] 10/28/2018, 10:20 AM

## 2018-10-28 NOTE — Progress Notes (Signed)
Met with patient and patient's wife at bedside. Wife agreeable to [facility] when medically ready.   Arlis Porta, Social Work Ship broker

## 2018-10-28 NOTE — Progress Notes (Signed)
Orthopaedic Trauma Service (OTS)  6 Days Post-Op Procedure(s) (LRB): ARTHROPLASTY BIPOLAR HIP (HEMIARTHROPLASTY) (Left)  Subjective: Patient reports pain as much improved in the right elbow, little pain and swelling in the wrist, and minimal in the left hip.    Objective: Current Vitals Blood pressure 110/73, pulse 89, temperature 98.2 F (36.8 C), temperature source Oral, resp. rate 19, height 5\' 6"  (1.676 m), weight 67.3 kg, SpO2 99 %. Vital signs in last 24 hours: Temp:  [97.8 F (36.6 C)-99 F (37.2 C)] 98.2 F (36.8 C) (02/20 0524) Pulse Rate:  [89-135] 89 (02/20 0524) Resp:  [18-21] 19 (02/20 0524) BP: (95-113)/(64-79) 110/73 (02/20 0622) SpO2:  [97 %-100 %] 99 % (02/19 2229) Weight:  [67.3 kg] 67.3 kg (02/20 0532)  Rate control improving.  Intake/Output from previous day: 02/19 0701 - 02/20 0700 In: 226.7 [I.V.:226.7] Out: 900 [Urine:900]  LABS Recent Labs    10/26/18 0408 10/27/18 0333  HGB 10.5* 10.4*   Recent Labs    10/26/18 0408 10/27/18 0333  WBC 11.7* 11.1*  RBC 3.19* 3.18*  HCT 32.8* 32.2*  PLT 175 215   Recent Labs    10/26/18 0408 10/27/18 0333  NA 136 135  K 4.0 4.1  CL 99 98  CO2 30 30  BUN 25* 23  CREATININE 0.74 0.63  GLUCOSE 103* 103*  CALCIUM 8.2* 8.2*   Recent Labs    10/26/18 0408  INR 1.44     Physical Exam RUEx  Much improved motion  Mild right wrist swelling LLE No changes  Assessment/Plan: 6 Days Post-Op Procedure(s) (LRB): ARTHROPLASTY BIPOLAR HIP (HEMIARTHROPLASTY) (Left) 1. PT/OT  OOB today with WBAT on LLE and UEX 2. DVT proph Other (comment)Eliquis 3. F/u 8-14 days  Myrene Galas, MD [facility] [phone] (p)

## 2018-10-28 NOTE — Progress Notes (Addendum)
Progress Note  Patient Name: [name] Date of Encounter: 10/28/2018  Primary Cardiologist: Hillis Range, MD   Subjective   No chest pain or SOB,  HR average 105 to 115 but still climbs to 125-130  Inpatient Medications    Scheduled Meds: . acetaminophen  1,000 mg Oral TID  . apixaban  5 mg Oral BID  . feeding supplement (ENSURE ENLIVE)  237 mL Oral TID BM  . metoprolol tartrate  25 mg Oral Q6H  . multivitamin with minerals  1 tablet Oral Daily  . pantoprazole  40 mg Oral Daily  . polyethylene glycol  17 g Oral Daily  . predniSONE  40 mg Oral Q breakfast  . senna  1 tablet Oral Daily   Continuous Infusions: . amiodarone 30 mg/hr (10/28/18 0631)  . lactated ringers 10 mL/hr at 10/22/18 0747   PRN Meds: bisacodyl, HYDROcodone-acetaminophen, menthol-cetylpyridinium **OR** phenol, morphine injection, ondansetron (ZOFRAN) IV   Vital Signs    Vitals:   10/27/18 2235 10/28/18 0524 10/28/18 0532 10/28/18 0622  BP:  103/77  110/73  Pulse:  89    Resp:  19    Temp: 98.4 F (36.9 C) 98.2 F (36.8 C)    TempSrc: Oral Oral    SpO2:      Weight:   67.3 kg   Height:        Intake/Output Summary (Last 24 hours) at 10/28/2018 1218 Last data filed at 10/28/2018 0943 Gross per 24 hour  Intake 392 ml  Output 900 ml  Net -508 ml   Last 3 Weights 10/28/2018 10/27/2018 10/26/2018  Weight (lbs) 148 lb 5.9 oz 144 lb 13.5 oz 142 lb 13.7 oz  Weight (kg) 67.3 kg 65.7 kg 64.8 kg      Telemetry    A fib with RVR and occ PVC - Personally Reviewed  ECG    No new - Personally Reviewed  Physical Exam   GEN: No acute distress.   Neck: No JVD Cardiac: irreg irreg , no murmurs, rubs, or gallops.  Respiratory: Clear to auscultation bilaterally. GI: Soft, nontender, non-distended  MS: No edema; No deformity. Neuro:  Nonfocal  Psych: Normal affect   Labs    Chemistry Recent Labs  Lab 10/25/18 0513 10/26/18 0408 10/27/18 0333  NA 137 136 135  K 3.6 4.0 4.1  CL 102  99 98  CO2 29 30 30   GLUCOSE 102* 103* 103*  BUN 22 25* 23  CREATININE 0.60* 0.74 0.63  CALCIUM 7.8* 8.2* 8.2*  PROT 4.5* 5.3* 5.1*  ALBUMIN 1.8* 1.8* 1.8*  AST 27 31 34  ALT 18 22 24   ALKPHOS 37* 45 48  BILITOT 0.6 0.7 1.0  GFRNONAA >60 >60 >60  GFRAA >60 >60 >60  ANIONGAP 6 7 7      Hematology Recent Labs  Lab 10/25/18 0513 10/26/18 0408 10/27/18 0333  WBC 9.3 11.7* 11.1*  RBC 3.15*  3.15* 3.19* 3.18*  HGB 10.4* 10.5* 10.4*  HCT 32.1* 32.8* 32.2*  MCV 101.9* 102.8* 101.3*  MCH 33.0 32.9 32.7  MCHC 32.4 32.0 32.3  RDW 13.2 13.3 13.2  PLT 139* 175 215    Cardiac EnzymesNo results for input(s): TROPONINI in the last 168 hours. No results for input(s): TROPIPOC in the last 168 hours.   BNPNo results for input(s): BNP, PROBNP in the last 168 hours.   DDimer No results for input(s): DDIMER in the last 168 hours.   Radiology    No results found.  Cardiac  Studies   Echo 10/28/18 IMPRESSIONS   1. The left ventricle has normal systolic function, with an ejection fraction of 55-60%. The cavity size was normal. Left ventricular diastolic Doppler parameters are indeterminate.  2. The right ventricle has normal systolic function. The cavity was normal. There is no increase in right ventricular wall thickness.  3. Left atrial size was moderately dilated.  4. Right atrial size was moderately dilated.  5. The mitral valve is normal in structure.  6. The tricuspid valve is normal in structure. Tricuspid valve regurgitation is moderate.  7. The aortic valve is normal in structure. Mild thickening of the aortic valve Moderate calcification of the aortic valve.  8. The pulmonic valve was normal in structure.  FINDINGS  Left Ventricle: The left ventricle has normal systolic function, with an ejection fraction of 55-60%. The cavity size was normal. There is no increase in left ventricular wall thickness. Left ventricular diastolic Doppler parameters are indeterminate Right  Ventricle: The right ventricle has normal systolic function. The cavity was normal. There is no increase in right ventricular wall thickness. Pacing wire/catheter visualized in the right ventricle. Left Atrium: left atrial size was moderately dilated  Right Atrium: right atrial size was moderately dilated. Interatrial Septum: No atrial level shunt detected by color flow Doppler. Pericardium: There is no evidence of pericardial effusion. Mitral Valve: The mitral valve is normal in structure. Mitral valve regurgitation is mild by color flow Doppler. Tricuspid Valve: The tricuspid valve is normal in structure. Tricuspid valve regurgitation is moderate by color flow Doppler. Aortic Valve: The aortic valve is normal in structure. Mild thickening of the aortic valve Moderate calcification of the aortic valve. Aortic valve regurgitation was not visualized by color flow Doppler. Pulmonic Valve: The pulmonic valve was normal in structure. Pulmonic valve regurgitation is not visualized by color flow Doppler. Venous: The inferior vena cava is normal in size with greater than 50% respiratory variability.     Patient Profile     85 y.o. male with hx of PAF, atrial tachycardia, prior PPM seen for preop evaluation prior to repair of hip fracture. Pt had hip surgery 10/22/18. Now with a fib RVR.   Assessment & Plan     A fib with RVR and hx of a fib but appears SR on admit.  Now with amiodarone drip,  Lopressor 25 po every 6 hours HR still not well controlled  BP soft not sure we can increase BB any further --Echo normal EF see above done today   --on Eliquis   possibility of SNF tomorrow?  Iron def anemia Hgb is 10.4  Lt hip fx with surgical repair   PPM stable with atrial pacing   For questions or updates, please contact [facility] Please consult [email or web address] for contact info under        Signed, Nada Boozer, NP  10/28/2018, 12:18 PM     Patient seen and examined. Agree with assessment  and plan. AF rate is better controlled/ now 110 -130;  Will convert to oral amiodarone initially 400 mg bid for 3 days then 200 mg bid. Will continue lopressor currently at 25 q 6 but may be able to consolidate to possible bid dosing. Probably should keep in hospital 24-48 hrs on the oral regimen.     Lennette Bihari, MD, [facility] 10/28/2018 4:18 PM

## 2018-10-29 ENCOUNTER — Other Ambulatory Visit: Payer: Self-pay

## 2018-10-29 LAB — CREATININE, SERUM
Creatinine, Ser: 0.66 mg/dL (ref 0.61–1.24)
GFR calc Af Amer: >60 mL/min (ref >60)
GFR calc non Af Amer: >60 mL/min (ref >60)

## 2018-10-29 MED ORDER — MAGNESIUM HYDROXIDE 400 MG/5ML PO SUSP
30.0000 mL | Freq: Once | ORAL | Status: AC
  Administered 2018-10-29: 30 mL via ORAL
  Filled 2018-10-29: qty 30

## 2018-10-29 MED ORDER — AMIODARONE HCL 200 MG PO TABS
400.0000 mg | ORAL_TABLET | Freq: Every day | ORAL | Status: AC
  Administered 2018-10-29: 400 mg via ORAL
  Filled 2018-10-29: qty 2

## 2018-10-29 MED ORDER — AMIODARONE HCL 200 MG PO TABS
200.0000 mg | ORAL_TABLET | Freq: Two times a day (BID) | ORAL | Status: DC
  Administered 2018-10-30: 200 mg via ORAL
  Filled 2018-10-29: qty 1

## 2018-10-29 MED ORDER — COLLAGENASE 250 UNIT/GM EX OINT
TOPICAL_OINTMENT | Freq: Every day | CUTANEOUS | Status: DC
  Administered 2018-10-29 – 2018-10-30 (×2): via TOPICAL
  Filled 2018-10-29: qty 30
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
## 2018-10-29 NOTE — Discharge Instructions (Addendum)
Information on my medicine - ELIQUIS (apixaban)  Why was Eliquis prescribed for you? Eliquis was prescribed for you to reduce the risk of a blood clot forming that can cause a stroke if you have a medical condition called atrial fibrillation (a type of irregular heartbeat).  What do You need to know about Eliquis ? Take your Eliquis TWICE DAILY - one tablet in the morning and one tablet in the evening with or without food. If you have difficulty swallowing the tablet whole please discuss with your pharmacist how to take the medication safely.  Take Eliquis exactly as prescribed by your doctor and DO NOT stop taking Eliquis without talking to the doctor who prescribed the medication.  Stopping may increase your risk of developing a stroke.  Refill your prescription before you run out.  After discharge, you should have regular check-up appointments with your healthcare provider that is prescribing your Eliquis.  In the future your dose may need to be changed if your kidney function or weight changes by a significant amount or as you get older.  What do you do if you miss a dose? If you miss a dose, take it as soon as you remember on the same day and resume taking twice daily.  Do not take more than one dose of ELIQUIS at the same time to make up a missed dose.  Important Safety Information A possible side effect of Eliquis is bleeding. You should call your healthcare provider right away if you experience any of the following: ? Bleeding from an injury or your nose that does not stop. ? Unusual colored urine (red or dark brown) or unusual colored stools (red or black). ? Unusual bruising for unknown reasons. ? A serious fall or if you hit your head (even if there is no bleeding).  Some medicines may interact with Eliquis and might increase your risk of bleeding or clotting while on Eliquis. To help avoid this, consult your healthcare provider or pharmacist prior to using any new  prescription or non-prescription medications, including herbals, vitamins, non-steroidal anti-inflammatory drugs (NSAIDs) and supplements.  This website has more information on Eliquis (apixaban): http://[email or web address].com/eliquis/home  Additional discharge instructions  Please get your medications reviewed and adjusted by your Primary MD.  Please request your Primary MD to go over all Hospital Tests and Procedure/Radiological results at the follow up, please get all Hospital records sent to your Prim MD by signing hospital release before you go home.  If you had Pneumonia of Lung problems at the Hospital: Please get a 2 view Chest X ray done in 6-8 weeks after hospital discharge or sooner if instructed by your Primary MD.  If you have Congestive Heart Failure: Please call your Cardiologist or Primary MD anytime you have any of the following symptoms:  1) 3 pound weight gain in 24 hours or 5 pounds in 1 week  2) shortness of breath, with or without a dry hacking cough  3) swelling in the hands, feet or stomach  4) if you have to sleep on extra pillows at night in order to breathe  Follow cardiac low salt diet and 1.5 lit/day fluid restriction.  If you have diabetes Accuchecks 4 times/day, Once in AM empty stomach and then before each meal. Log in all results and show them to your primary doctor at your next visit. If any glucose reading is under 80 or above 300 call your primary MD immediately.  If you have Seizure/Convulsions/Epilepsy: Please do not drive, operate heavy   machinery, participate in activities at heights or participate in high speed sports until you have seen by Primary MD or a Neurologist and advised to do so again.  If you had Gastrointestinal Bleeding: Please ask your Primary MD to check a complete blood count within one week of discharge or at your next visit. Your endoscopic/colonoscopic biopsies that are pending at the time of discharge, will also need to followed by  your Primary MD.  Get Medicines reviewed and adjusted. Please take all your medications with you for your next visit with your Primary MD  Please request your Primary MD to go over all hospital tests and procedure/radiological results at the follow up, please ask your Primary MD to get all Hospital records sent to his/her office.  If you experience worsening of your admission symptoms, develop shortness of breath, life threatening emergency, suicidal or homicidal thoughts you must seek medical attention immediately by calling 911 or calling your MD immediately  if symptoms less severe.  You must read complete instructions/literature along with all the possible adverse reactions/side effects for all the Medicines you take and that have been prescribed to you. Take any new Medicines after you have completely understood and accpet all the possible adverse reactions/side effects.   Do not drive or operate heavy machinery when taking Pain medications.   Do not take more than prescribed Pain, Sleep and Anxiety Medications  Special Instructions: If you have smoked or chewed Tobacco  in the last 2 yrs please stop smoking, stop any regular Alcohol  and or any Recreational drug use.  Wear Seat belts while driving.  Please note You were cared for by a hospitalist during your hospital stay. If you have any questions about your discharge medications or the care you received while you were in the hospital after you are discharged, you can call the unit and asked to speak with the hospitalist on call if the hospitalist that took care of you is not available. Once you are discharged, your primary care physician will handle any further medical issues. Please note that NO REFILLS for any discharge medications will be authorized once you are discharged, as it is imperative that you return to your primary care physician (or establish a relationship with a primary care physician if you do not have one) for your  aftercare needs so that they can reassess your need for medications and monitor your lab values.  You can reach the hospitalist office at phone [phone] or fax [phone]   If you do not have a primary care physician, you can call [phone] for a physician referral.    

## 2018-10-29 NOTE — Consult Note (Signed)
WOC Nurse wound consult note Reason for Consult: Consult requested for right posterior rib and coccyx.  Pt had a fall prior to admission and the timing and appearance of these wounds is consistent with deep tissue injuries that were sustained during that event. Wound type: Coccyx with dark reddish purple deep tissue pressure injury; 2X.2cm, intact skin, no odor or drainage Right posterior rib with previous deep tissue pressure injury which has evolved into an unstageable wound; 3X2cm, dark reddish purple surrounding slough, small amt tan drainage, no odor Pressure Injury POA: Yes Pt is very emaciated with curved protruding sacrum bone; it is difficult to reduce pressure to the affected areas.   Dressing procedure/placement/frequency: Air mattress to reduce pressure. Foam dressing to protect and promote healing to coccyx.  Santyl ointment to provide enzymatic debridement of nonviable tissue to right posterior rib. Please re-consult if further assistance is needed.  Thank-you,  Cammie Mcgee MSN, RN, CWOCN, [place], CNS [phone]
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
## 2018-10-29 NOTE — Progress Notes (Signed)
PROGRESS NOTE   [name]  SWH:[id number]    [name]: [birth year]    DOA: 10/20/2018  PCP: [name], MD   I have briefly reviewed patients previous medical records in [facility].  Brief Narrative:  85 year old married male, lives with spouse, moved around at home with the help of electric wheelchair and transferred with walker, PMH of PAF on Coumadin and sotalol PTA, tachybradycardia syndrome status post PPM, GERD, presented to [facility] ED on 10/20/2018 following a fall at home and noted to have left proximal femoral neck fracture.  Orthopedics consulted and he is status post left hip hemiarthroplasty.Hospital course complicated by A. fib with persistent RVR, cardiology assisting, was on IV amiodarone infusion and oral metoprolol and with right elbow acute pseudogout confirmed by arthrocentesis.   Assessment & Plan:   Principal Problem:   Left displaced femoral neck fracture (HCC) Active Problems:   ATRIAL FIBRILLATION   Essential hypertension, benign   Chronic pain syndrome   Memory loss   Protein-calorie malnutrition, severe   Pressure injury of skin   1. Left displaced femoral neck fracture: Sustained status post mechanical fall at home.  After cardiology assisted with controlling A. fib with RVR, orthopedics performed left hip hemiarthroplasty on 2/14.As per orthopedic follow-up 2/18: WBAT left leg, posterior hip precautions, dressing changes as needed.  Outpatient follow-up with orthopedics in 1 to 2 weeks.  Mobilize with PT. 2. A. fib with RVR/PPM: Has persistent RVR here.  History of PPM, was 30% A. fib on 09/07/2018.  Cardiology was consulted.  After sotalol washout, he was started on amiodarone infusion, metoprolol increased on 2/19 to 25 mg every 6 hourly, was on Coumadin PTA, now on Eliquis since 2/16.  Transitioned to oral amiodarone 400 mg twice daily on 2/20, rates better controlled and stable in the 90s to 120s.  Cardiology follow-up appreciated and suspect he can  be discharged to SNF in a.m. on amiodarone 200 mg twice daily and twice daily metoprolol. 3. Right elbow and wrist acute pseudogout: Orthopedics performed diagnostic arthrocentesis which showed CPPD crystals, culture negative to date.  Continue oral prednisone.  Continues to steadily improve.  Orthopedics follow-up appreciated.  Start tapering prednisone from tomorrow. 4. Essential hypertension: Controlled. 5. Chronic pain/DJD/DDD: Controlled except for right elbow and wrist pain due to acute pseudogout.  Pain management.  As per spouse, she was trying to wean him off of opioids at home PTA. 6. GERD: Continue PPI. 7. Dementia: Suspect at least mild dementia with memory impairment. 8. Leukocytosis: Mild.  No clinical concern for infectious etiology.  Suspected will go up due to prednisone. 9. Thrombocytopenia: Resolved. 10. Postop acute blood loss anemia: Hemoglobin stable in the 10 g range for the last 3 days.  Hemoglobin was 13.4 on admission.  Anemia panel: Iron 7, TIBC 136, saturation ratio 5, ferritin 204, folate 22, B12: 188.  Periodically follow CBCs. 11. Hypokalemia: Replaced.  Magnesium normal. 12. Hypophosphatemia: Replaced. 13. Severe malnutrition in the context of chronic illness: Management per dietitian input. 14. Fever: Noted on 2/16 and 2/17.  Suspected due to acute pseudogout.  Cultures negative and fevers have subsided. 15. Constipation: Despite MiraLAX, senna and Dulcolax suppository, had not had a BM until this morning.  Ordered milk of magnesia.  Patient declines enema. 16. Deep tissue pressure injury of coccyx, right: Wound RN input appreciated and indicates that patient fell prior to admission and the timing and appearance of these wounds consistent with DTI sustained during that event.  Hence  present on admission.   DVT prophylaxis: Apixaban Code Status: Full Family Communication: Discussed in detail with patient spouse at bedside, updated care and answered  questions. Disposition: DC to SNF pending clinical improvement, hopefully on 10/30/2018   Consultants:  Orthopedics Cardiology  Procedures:  Left hip hemiarthroplasty 2/14 Right elbow arthrocentesis  Antimicrobials:  None   Subjective: Overall continues to feel better.  No pain in right elbow or wrist and able to move better.  Fingers stiff.  Still no BM.  No abdominal pain or distention.  Has not been out of bed much.  No chest pain, dyspnea or palpitations.  ROS: As above, otherwise poor historian due to memory impairment.  Objective:  Vitals:   10/28/18 2054 10/28/18 2316 10/29/18 0911 10/29/18 1444  BP: 111/84 119/82 112/80 128/88  Pulse: (!) 109 (!) 122 (!) 115 (!) 129  Resp: (!) 25     Temp: 98.7 F (37.1 C)     TempSrc: Oral     SpO2: 94%     Weight:      Height:        Examination:  General exam: Pleasant elderly male, moderately built and frail, lying comfortably propped up in bed.  Oral mucosa moist. Respiratory system: Clear to auscultation.  No increased work of breathing.  Stable Cardiovascular system: S1 & S2 heard, irregularly irregular and tachycardic. No JVD, murmurs, rubs, gallops or clicks. No pedal edema.  Telemetry personally reviewed: A. fib with RVR in the 90s-120s. Gastrointestinal system: Abdomen is nondistended, soft and nontender. No organomegaly or masses felt. Normal bowel sounds heard.  Stable Central nervous system: Alert and oriented x2. No focal neurological deficits.  Stable.  Extremities: Symmetric 5 x 5 power. Left hip postop dressing clean and dry.  No significant warmth or swelling of right elbow or wrist.  No painful range of movements of right elbow or wrist. Skin: No rashes, lesions or ulcers Psychiatry: Judgement and insight appear impaired. Mood & affect appropriate.     Data Reviewed: I have personally reviewed following labs and imaging studies  CBC: Recent Labs  Lab 10/23/18 0619 10/24/18 0441 10/25/18 0513  10/26/18 0408 10/27/18 0333  WBC 10.8* 9.7 9.3 11.7* 11.1*  NEUTROABS 8.2* 7.4 6.6 9.0* 9.0*  HGB 12.1* 11.8* 10.4* 10.5* 10.4*  HCT 37.6* 35.5* 32.1* 32.8* 32.2*  MCV 101.3* 101.7* 101.9* 102.8* 101.3*  PLT 121* 136* 139* 175 215   Basic Metabolic Panel: Recent Labs  Lab 10/23/18 0619 10/24/18 0441 10/25/18 0513 10/26/18 0408 10/27/18 0333 10/29/18 0610  NA 134* 136 137 136 135  --   K 4.3 3.7 3.6 4.0 4.1  --   CL 103 102 102 99 98  --   CO2 27 27 29 30 30   --   GLUCOSE 95 107* 102* 103* 103*  --   BUN 21 22 22  25* 23  --   CREATININE 0.79 0.61 0.60* 0.74 0.63 0.66  CALCIUM 8.0* 7.9* 7.8* 8.2* 8.2*  --   MG 1.7 1.9 1.8 1.8 1.8  --   PHOS 3.0 1.9* 2.8 2.8 2.9  --    Liver Function Tests: Recent Labs  Lab 10/23/18 0619 10/24/18 0441 10/25/18 0513 10/26/18 0408 10/27/18 0333  AST 31 28 27 31  34  ALT 19 18 18 22 24   ALKPHOS 37* 44 37* 45 48  BILITOT 1.8* 1.3* 0.6 0.7 1.0  PROT 5.1* 5.2* 4.5* 5.3* 5.1*  ALBUMIN 2.4* 2.2* 1.8* 1.8* 1.8*   Coagulation Profile: Recent Labs  Lab 10/23/18 0619 10/24/18 0441 10/25/18 0513 10/26/18 0408  INR 1.36 1.23 1.56 1.44     Recent Results (from the past 240 hour(s))  MRSA PCR Screening     Status: Abnormal   Collection Time: 10/20/18  6:54 PM  Result Value Ref Range Status   MRSA by PCR POSITIVE (A) NEGATIVE Final    Comment:        The GeneXpert MRSA Assay (FDA approved for NASAL specimens only), is one component of a comprehensive MRSA colonization surveillance program. It is not intended to diagnose MRSA infection nor to guide or monitor treatment for MRSA infections. RESULT CALLED TO, READ BACK BY AND VERIFIED WITH: Darcella Cheshire MARTIN RN 2209 10/20/18 A BROWNING Performed at [facility], [address] [place], [place] [address]   Culture, blood (routine x 2)     Status: None (Preliminary result)   Collection Time: 10/25/18  9:05 AM  Result Value Ref Range Status   Specimen Description BLOOD LEFT ANTECUBITAL   Final   Special Requests   Final    BOTTLES DRAWN AEROBIC ONLY Blood Culture adequate volume Performed at [facility], [address] [place], [place] [address]    Culture NO GROWTH 4 DAYS  Final   Report Status PENDING  Incomplete  Culture, blood (routine x 2)     Status: None (Preliminary result)   Collection Time: 10/25/18  9:11 AM  Result Value Ref Range Status   Specimen Description BLOOD LEFT HAND  Final   Special Requests   Final    BOTTLES DRAWN AEROBIC ONLY Blood Culture adequate volume Performed at [facility], [address] [place], [place] [address]    Culture NO GROWTH 4 DAYS  Final   Report Status PENDING  Incomplete  Aerobic/Anaerobic Culture (surgical/deep wound)     Status: None (Preliminary result)   Collection Time: 10/25/18 12:30 PM  Result Value Ref Range Status   Specimen Description SYNOVIAL RIGHT ELBOW  Final   Special Requests NONE  Final   Gram Stain   Final    MODERATE WBC PRESENT, PREDOMINANTLY PMN NO ORGANISMS SEEN Performed at [facility], [address] [place], [place] [address]    Culture   Final    NO GROWTH 4 DAYS NO ANAEROBES ISOLATED; CULTURE IN PROGRESS FOR 5 DAYS   Report Status PENDING  Incomplete         Radiology Studies: No results found.      Scheduled Meds: . acetaminophen  1,000 mg Oral TID  . amiodarone  400 mg Oral QHS   Followed by  . [START ON 10/30/2018] amiodarone  200 mg Oral BID  . apixaban  5 mg Oral BID  . collagenase   Topical Daily  . feeding supplement (ENSURE ENLIVE)  237 mL Oral TID BM  . metoprolol tartrate  25 mg Oral Q6H  . multivitamin with minerals  1 tablet Oral Daily  . pantoprazole  40 mg Oral Daily  . polyethylene glycol  17 g Oral BID  . predniSONE  40 mg Oral Q breakfast  . senna  2 tablet Oral Daily   Continuous Infusions: . lactated ringers 10 mL/hr at 10/22/18 0747     LOS: 9 days     Marcellus ScottAnand Hongalgi, MD, FACP, [facility]. [facility]  To contact the  attending provider between 7A-7P or the covering provider during after hours 7P-7A, please log into the web site [email or web address] and access using universal [facility] password for that web site. If you do not have the password, please call the hospital operator.  10/29/2018, 4:38 PM

## 2018-10-29 NOTE — Progress Notes (Addendum)
Progress Note  Patient Name: [name] Date of Encounter: 10/29/2018  Primary Cardiologist: Hillis RangeJames Allred, MD   Subjective   No chest pain or SOB  Inpatient Medications    Scheduled Meds: . acetaminophen  1,000 mg Oral TID  . amiodarone  400 mg Oral BID  . apixaban  5 mg Oral BID  . feeding supplement (ENSURE ENLIVE)  237 mL Oral TID BM  . metoprolol tartrate  25 mg Oral Q6H  . multivitamin with minerals  1 tablet Oral Daily  . pantoprazole  40 mg Oral Daily  . polyethylene glycol  17 g Oral BID  . predniSONE  40 mg Oral Q breakfast  . senna  2 tablet Oral Daily   Continuous Infusions: . lactated ringers 10 mL/hr at 10/22/18 0747   PRN Meds: bisacodyl, HYDROcodone-acetaminophen, menthol-cetylpyridinium **OR** phenol, morphine injection, ondansetron (ZOFRAN) IV   Vital Signs    Vitals:   10/28/18 1252 10/28/18 1727 10/28/18 2054 10/28/18 2316  BP: 108/62 112/72 111/84 119/82  Pulse: (!) 120 (!) 127 (!) 109 (!) 122  Resp:   (!) 25   Temp:   98.7 F (37.1 C)   TempSrc:   Oral   SpO2:   94%   Weight:      Height:        Intake/Output Summary (Last 24 hours) at 10/29/2018 0740 Last data filed at 10/28/2018 2000 Gross per 24 hour  Intake 386.69 ml  Output 1175 ml  Net -788.31 ml   Last 3 Weights 10/28/2018 10/27/2018 10/26/2018  Weight (lbs) 148 lb 5.9 oz 144 lb 13.5 oz 142 lb 13.7 oz  Weight (kg) 67.3 kg 65.7 kg 64.8 kg      Telemetry    A fib between 105 and 125 still elevated at times. - Personally Reviewed  ECG    No new - Personally Reviewed  Physical Exam   GEN: No acute distress.   Neck: No JVD Cardiac: RRR, no murmurs, rubs, or gallops.  Respiratory: Clear to auscultation bilaterally. GI: Soft, nontender, non-distended  MS: No edema; No deformity. Neuro:  Nonfocal  Psych: Normal affect   Labs    Chemistry Recent Labs  Lab 10/25/18 0513 10/26/18 0408 10/27/18 0333 10/29/18 0610  NA 137 136 135  --   K 3.6 4.0 4.1  --   CL  102 99 98  --   CO2 29 30 30   --   GLUCOSE 102* 103* 103*  --   BUN 22 25* 23  --   CREATININE 0.60* 0.74 0.63 0.66  CALCIUM 7.8* 8.2* 8.2*  --   PROT 4.5* 5.3* 5.1*  --   ALBUMIN 1.8* 1.8* 1.8*  --   AST 27 31 34  --   ALT 18 22 24   --   ALKPHOS 37* 45 48  --   BILITOT 0.6 0.7 1.0  --   GFRNONAA >60 >60 >60 >60  GFRAA >60 >60 >60 >60  ANIONGAP 6 7 7   --      Hematology Recent Labs  Lab 10/25/18 0513 10/26/18 0408 10/27/18 0333  WBC 9.3 11.7* 11.1*  RBC 3.15*  3.15* 3.19* 3.18*  HGB 10.4* 10.5* 10.4*  HCT 32.1* 32.8* 32.2*  MCV 101.9* 102.8* 101.3*  MCH 33.0 32.9 32.7  MCHC 32.4 32.0 32.3  RDW 13.2 13.3 13.2  PLT 139* 175 215    Cardiac EnzymesNo results for input(s): TROPONINI in the last 168 hours. No results for input(s): TROPIPOC in the last  168 hours.   BNPNo results for input(s): BNP, PROBNP in the last 168 hours.   DDimer No results for input(s): DDIMER in the last 168 hours.   Radiology    No results found.  Cardiac Studies   Echo 10/28/18 IMPRESSIONS  1. The left ventricle has normal systolic function, with an ejection fraction of 55-60%. The cavity size was normal. Left ventricular diastolic Doppler parameters are indeterminate. 2. The right ventricle has normal systolic function. The cavity was normal. There is no increase in right ventricular wall thickness. 3. Left atrial size was moderately dilated. 4. Right atrial size was moderately dilated. 5. The mitral valve is normal in structure. 6. The tricuspid valve is normal in structure. Tricuspid valve regurgitation is moderate. 7. The aortic valve is normal in structure. Mild thickening of the aortic valve Moderate calcification of the aortic valve. 8. The pulmonic valve was normal in structure.  FINDINGS Left Ventricle: The left ventricle has normal systolic function, with an ejection fraction of 55-60%. The cavity size was normal. There is no increase in left ventricular wall  thickness. Left ventricular diastolic Doppler parameters are indeterminate Right Ventricle: The right ventricle has normal systolic function. The cavity was normal. There is no increase in right ventricular wall thickness. Pacing wire/catheter visualized in the right ventricle. Left Atrium: left atrial size was moderately dilated  Right Atrium: right atrial size was moderately dilated. Interatrial Septum: No atrial level shunt detected by color flow Doppler. Pericardium: There is no evidence of pericardial effusion. Mitral Valve: The mitral valve is normal in structure. Mitral valve regurgitation is mild by color flow Doppler. Tricuspid Valve: The tricuspid valve is normal in structure. Tricuspid valve regurgitation is moderate by color flow Doppler. Aortic Valve: The aortic valve is normal in structure. Mild thickening of the aortic valve Moderate calcification of the aortic valve. Aortic valve regurgitation was not visualized by color flow Doppler. Pulmonic Valve: The pulmonic valve was normal in structure. Pulmonic valve regurgitation is not visualized by color flow Doppler. Venous: The inferior vena cava is normal in size with greater than 50% respiratory variability.  Patient Profile     85 y.o. male with hx of PAF, atrial tachycardia, prior PPM seen for preop evaluation prior to repair of hip fracture. Pt had hip surgery 10/22/18. Now with a fib RVR.    Assessment & Plan     A fib with RVR and hx of a fib but appears SR on admit.  Then converted to a fib.   Now off amiodarone drip,  on amiodarone 400 BID  then 200 mg BID.   Lopressor 25 po every 6 hours HR still not well controlled  BP soft not sure we can increase BB any further --HR 120s --Echo normal EF see above done today   --on Eliquis  --possibility of SNF Sat or Sunday  Iron def anemia Hgb is 10.4  Lt hip fx with surgical repair   PPM stable with atrial pacing      For questions or updates, please contact [facility] Please consult [email or web address] for contact info under     Signed, Nada Boozer, NP  10/29/2018, 7:40 AM      Patient seen and examined. Agree with assessment and plan.   Lennette Bihari, MD, [facility] 10/29/2018 11:19 AM   Patient seen and examined. Agree with assessment and plan.  Heart rate is responding.  Now off amiodarone IV started last evening on amiodarone initially at 400 twice daily. HR this  am 98 - 118.  We will continue regimen today with probable plans to transfer to SNF tomorrow and would recommend a 200 mg twice daily regimen transfer with continuation of metoprolol.  His metoprolol dose potentially can be slightly adjusted for twice daily therapy.   Lennette Bihari, MD, [facility] 10/29/2018 11:12 AM

## 2018-10-30 DIAGNOSIS — E871 Hypo-osmolality and hyponatremia: Secondary | ICD-10-CM | POA: Diagnosis not present

## 2018-10-30 DIAGNOSIS — M1120 Other chondrocalcinosis, unspecified site: Secondary | ICD-10-CM

## 2018-10-30 DIAGNOSIS — Z96642 Presence of left artificial hip joint: Secondary | ICD-10-CM | POA: Diagnosis not present

## 2018-10-30 DIAGNOSIS — I119 Hypertensive heart disease without heart failure: Secondary | ICD-10-CM | POA: Diagnosis not present

## 2018-10-30 DIAGNOSIS — I4891 Unspecified atrial fibrillation: Secondary | ICD-10-CM | POA: Diagnosis not present

## 2018-10-30 DIAGNOSIS — M542 Cervicalgia: Secondary | ICD-10-CM | POA: Diagnosis not present

## 2018-10-30 DIAGNOSIS — Z741 Need for assistance with personal care: Secondary | ICD-10-CM | POA: Diagnosis not present

## 2018-10-30 DIAGNOSIS — I10 Essential (primary) hypertension: Secondary | ICD-10-CM | POA: Diagnosis not present

## 2018-10-30 DIAGNOSIS — M2550 Pain in unspecified joint: Secondary | ICD-10-CM | POA: Diagnosis not present

## 2018-10-30 DIAGNOSIS — I4819 Other persistent atrial fibrillation: Secondary | ICD-10-CM | POA: Diagnosis not present

## 2018-10-30 DIAGNOSIS — R52 Pain, unspecified: Secondary | ICD-10-CM | POA: Diagnosis not present

## 2018-10-30 DIAGNOSIS — R1312 Dysphagia, oropharyngeal phase: Secondary | ICD-10-CM | POA: Diagnosis not present

## 2018-10-30 DIAGNOSIS — R4189 Other symptoms and signs involving cognitive functions and awareness: Secondary | ICD-10-CM | POA: Diagnosis not present

## 2018-10-30 DIAGNOSIS — S72002D Fracture of unspecified part of neck of left femur, subsequent encounter for closed fracture with routine healing: Secondary | ICD-10-CM | POA: Diagnosis not present

## 2018-10-30 DIAGNOSIS — D62 Acute posthemorrhagic anemia: Secondary | ICD-10-CM

## 2018-10-30 DIAGNOSIS — R41841 Cognitive communication deficit: Secondary | ICD-10-CM | POA: Diagnosis not present

## 2018-10-30 DIAGNOSIS — Z7401 Bed confinement status: Secondary | ICD-10-CM | POA: Diagnosis not present

## 2018-10-30 DIAGNOSIS — L89890 Pressure ulcer of other site, unstageable: Secondary | ICD-10-CM | POA: Diagnosis not present

## 2018-10-30 DIAGNOSIS — S72002S Fracture of unspecified part of neck of left femur, sequela: Secondary | ICD-10-CM

## 2018-10-30 DIAGNOSIS — S72032D Displaced midcervical fracture of left femur, subsequent encounter for closed fracture with routine healing: Secondary | ICD-10-CM | POA: Diagnosis not present

## 2018-10-30 DIAGNOSIS — D649 Anemia, unspecified: Secondary | ICD-10-CM | POA: Diagnosis not present

## 2018-10-30 DIAGNOSIS — D689 Coagulation defect, unspecified: Secondary | ICD-10-CM | POA: Diagnosis not present

## 2018-10-30 DIAGNOSIS — M40202 Unspecified kyphosis, cervical region: Secondary | ICD-10-CM | POA: Diagnosis not present

## 2018-10-30 DIAGNOSIS — I471 Supraventricular tachycardia: Secondary | ICD-10-CM | POA: Diagnosis not present

## 2018-10-30 DIAGNOSIS — M549 Dorsalgia, unspecified: Secondary | ICD-10-CM | POA: Diagnosis not present

## 2018-10-30 DIAGNOSIS — D539 Nutritional anemia, unspecified: Secondary | ICD-10-CM | POA: Diagnosis not present

## 2018-10-30 DIAGNOSIS — R2689 Other abnormalities of gait and mobility: Secondary | ICD-10-CM | POA: Diagnosis not present

## 2018-10-30 DIAGNOSIS — Z681 Body mass index (BMI) 19 or less, adult: Secondary | ICD-10-CM | POA: Diagnosis not present

## 2018-10-30 DIAGNOSIS — M419 Scoliosis, unspecified: Secondary | ICD-10-CM | POA: Diagnosis not present

## 2018-10-30 DIAGNOSIS — L89152 Pressure ulcer of sacral region, stage 2: Secondary | ICD-10-CM | POA: Diagnosis not present

## 2018-10-30 DIAGNOSIS — M5489 Other dorsalgia: Secondary | ICD-10-CM | POA: Diagnosis not present

## 2018-10-30 DIAGNOSIS — D72829 Elevated white blood cell count, unspecified: Secondary | ICD-10-CM | POA: Diagnosis not present

## 2018-10-30 DIAGNOSIS — M25561 Pain in right knee: Secondary | ICD-10-CM | POA: Diagnosis not present

## 2018-10-30 DIAGNOSIS — E43 Unspecified severe protein-calorie malnutrition: Secondary | ICD-10-CM | POA: Diagnosis not present

## 2018-10-30 DIAGNOSIS — M6281 Muscle weakness (generalized): Secondary | ICD-10-CM | POA: Diagnosis not present

## 2018-10-30 DIAGNOSIS — S72042D Displaced fracture of base of neck of left femur, subsequent encounter for closed fracture with routine healing: Secondary | ICD-10-CM | POA: Diagnosis not present

## 2018-10-30 DIAGNOSIS — Z4789 Encounter for other orthopedic aftercare: Secondary | ICD-10-CM | POA: Diagnosis not present

## 2018-10-30 DIAGNOSIS — R634 Abnormal weight loss: Secondary | ICD-10-CM | POA: Diagnosis not present

## 2018-10-30 DIAGNOSIS — I739 Peripheral vascular disease, unspecified: Secondary | ICD-10-CM | POA: Diagnosis not present

## 2018-10-30 DIAGNOSIS — S72002A Fracture of unspecified part of neck of left femur, initial encounter for closed fracture: Secondary | ICD-10-CM | POA: Diagnosis not present

## 2018-10-30 DIAGNOSIS — R627 Adult failure to thrive: Secondary | ICD-10-CM | POA: Diagnosis not present

## 2018-10-30 DIAGNOSIS — Z950 Presence of cardiac pacemaker: Secondary | ICD-10-CM | POA: Diagnosis not present

## 2018-10-30 DIAGNOSIS — L89130 Pressure ulcer of right lower back, unstageable: Secondary | ICD-10-CM | POA: Diagnosis not present

## 2018-10-30 LAB — CULTURE, BLOOD (ROUTINE X 2)
Culture: NO GROWTH
Culture: NO GROWTH
Special Requests: ADEQUATE
Special Requests: ADEQUATE

## 2018-10-30 LAB — AEROBIC/ANAEROBIC CULTURE (SURGICAL/DEEP WOUND)

## 2018-10-30 LAB — AEROBIC/ANAEROBIC CULTURE W GRAM STAIN (SURGICAL/DEEP WOUND): Culture: NO GROWTH

## 2018-10-30 MED ORDER — ACETAMINOPHEN 500 MG PO TABS: 1000.0000 mg | ORAL_TABLET | Freq: Three times a day (TID) | ORAL | Status: AC

## 2018-10-30 MED ORDER — AMIODARONE HCL 200 MG PO TABS: 200.0000 mg | ORAL_TABLET | Freq: Two times a day (BID) | ORAL | Status: AC

## 2018-10-30 MED ORDER — ENSURE ENLIVE PO LIQD: 237.0000 mL | Freq: Three times a day (TID) | ORAL | Status: AC

## 2018-10-30 MED ORDER — BISACODYL 10 MG RE SUPP: 10.0000 mg | Freq: Every day | RECTAL | Status: AC | PRN

## 2018-10-30 MED ORDER — METOPROLOL TARTRATE 50 MG PO TABS: 50.0000 mg | ORAL_TABLET | Freq: Two times a day (BID) | ORAL | Status: AC

## 2018-10-30 MED ORDER — PREDNISONE 10 MG PO TABS: ORAL_TABLET | ORAL | Status: AC

## 2018-10-30 MED ORDER — COLLAGENASE 250 UNIT/GM EX OINT: TOPICAL_OINTMENT | Freq: Every day | CUTANEOUS | Status: AC

## 2018-10-30 MED ORDER — APIXABAN 5 MG PO TABS: 5.0000 mg | ORAL_TABLET | Freq: Two times a day (BID) | ORAL | Status: DC

## 2018-10-30 MED ORDER — SENNA 8.6 MG PO TABS: 2.0000 | ORAL_TABLET | Freq: Every day | ORAL | Status: AC

## 2018-10-30 MED ORDER — POLYETHYLENE GLYCOL 3350 17 G PO PACK: 17.0000 g | PACK | Freq: Every day | ORAL | Status: AC

## 2018-10-30 NOTE — Discharge Summary (Signed)
Physician Discharge Summary  [name] [name]:[id number] [name]: 11-04-1928  PCP: [name], MD  Admit date: 10/20/2018 Discharge date: 10/30/2018  Recommendations for Outpatient Follow-up:  1. MD at SNF in 2 to 3 days with repeat labs (CBC & BMP). 2. Dr. Myrene Galas, Orthopedics in 1 week for postop follow-up. 3. Sheilah Pigeon, PA-C/EP Cardiology on 11/26/2018 at 2:30 PM. 4. Dr. Guerry Bruin, PCP upon discharge from SNF.  Home Health: Patient being discharged to [facility], SNF. Equipment/Devices: TBD at SNF.  Discharge Condition: Improved and stable. CODE STATUS: Full Diet recommendation: Heart healthy diet.  Discharge Diagnoses:  Principal Problem:   Left displaced femoral neck fracture (HCC) Active Problems:   ATRIAL FIBRILLATION   Essential hypertension, benign   Chronic pain syndrome   Memory loss   Protein-calorie malnutrition, severe   Pressure injury of skin   Brief Summary: 85 year old married male, lives with spouse, moved around at home with the help of electric wheelchair and transferred with walker, PMH of PAF on Coumadin and sotalol PTA, tachybradycardia syndrome status post PPM, GERD, presented to [facility] ED on 10/20/2018 following a fall at home and noted to have left proximal femoral neck fracture.  Orthopedics consulted and he is status post left hip hemiarthroplasty.Hospital course complicated by A. fib with persistent RVR and acute pseudogout of right elbow and wrist.  Cardiology and Orthopedics consulted.   Assessment & Plan:  1. Left displaced femoral neck fracture: Sustained status post mechanical fall at home.  After cardiology assisted with controlling A. fib with RVR, Orthopedics performed left hip hemiarthroplasty on 2/14. As per orthopedic follow-up 2/18: WBAT left leg, posterior hip precautions, dressing changes as needed.  Outpatient follow-up with orthopedics in 1 to 2 weeks.  No pain reported and has not used opioids for 3 to 4 days  and even prior to that used minimally.  Pain adequately controlled on scheduled Tylenol which can be changed in a couple of days to as needed depending on how he does. 2. A. fib with RVR/PPM: Had persistent RVR here.  History of PPM, was 30% A. fib on 09/07/2018.  Cardiology was consulted.  After sotalol washout, he was started on amiodarone infusion, metoprolol increased on 2/19 to 25 mg every 6 hourly, was on Coumadin PTA, now on Eliquis since 2/16.  Transitioned to oral amiodarone 400 mg twice daily on 2/20.  He reverted to sinus rhythm on 2/21 at approximately 5:45 PM.  Cardiology follow-up appreciated and recommend amiodarone 200 mg twice daily, metoprolol 50 mg twice daily at discharge and outpatient follow-up with EP cardiology.  Improved and stable.  As stated above, sotalol and Coumadin discontinued this admission. 3. Right elbow and wrist acute pseudogout: Orthopedics performed diagnostic arthrocentesis which showed CPPD crystals, culture negative to date.  Started prednisone 40 mg daily and completed 4 days so far.  Acute flare significantly improved and may have even resolved.  Prednisone taper at discharge.  X-ray reported possible right radial head fracture, was evaluated by orthopedics and outpatient follow-up with orthopedics. 4. Essential hypertension: Controlled. 5. Chronic pain/DJD/DDD: Controlled except for right elbow and wrist pain due to acute pseudogout which has improved.  As per spouse, she was trying to wean him off of opioids at home PTA.  Has not used opioids for the last 3 to 4 days, continue Tylenol alone at discharge. 6. GERD/large hiatal hernia: Asymptomatic and not on PPI PTA. 7. Dementia: Suspect at least mild dementia with memory impairment. 8. Leukocytosis: Mild.  No clinical concern for infectious etiology.  9. Thrombocytopenia: Resolved. 10. Postop acute blood loss anemia: Hemoglobin stable in the 10 g range for the last 3 days check.  Hemoglobin was 13.4 on admission.   Anemia panel: Iron 7, TIBC 136, saturation ratio 5, ferritin 204, folate 22, B12: 188.  Periodically follow CBCs.  Consider iron and B12 supplements as outpatient. 11. Hypokalemia: Replaced.  Magnesium normal. 12. Hypophosphatemia: Replaced. 13. Severe malnutrition in the context of chronic illness: Management per dietitian input. 14. Fever: Noted on 2/16 and 2/17.  Suspected due to acute pseudogout.  Cultures negative and fevers have subsided. 15. Constipation: Despite MiraLAX, senna and Dulcolax suppository, had not had a BM until this yesterday.  Good effect after milk of magnesia and had multiple BMs.  Continue bowel regimen. 16. Deep tissue pressure injury of coccyx, right: Wound RN input appreciated and indicates that patient fell prior to admission and the timing and appearance of these wounds consistent with DTI sustained during that event.  Hence present on admission.  Please refer to wound care consult note for management.    Consultants:  Orthopedics Cardiology  Procedures:  Left hip hemiarthroplasty 2/14 Right elbow arthrocentesis   Discharge Instructions  Discharge Instructions    Call MD for:  difficulty breathing, headache or visual disturbances   Complete by:  As directed    Call MD for:  extreme fatigue   Complete by:  As directed    Call MD for:  persistant dizziness or light-headedness   Complete by:  As directed    Call MD for:  persistant nausea and vomiting   Complete by:  As directed    Call MD for:  redness, tenderness, or signs of infection (pain, swelling, redness, odor or green/yellow discharge around incision site)   Complete by:  As directed    Call MD for:  severe uncontrolled pain   Complete by:  As directed    Call MD for:  temperature >100.4   Complete by:  As directed    Diet - low sodium heart healthy   Complete by:  As directed    Increase activity slowly   Complete by:  As directed        Medication List    STOP taking these  medications   HYDROcodone-acetaminophen 10-325 MG tablet Commonly known as:  NORCO   sotalol 160 MG tablet Commonly known as:  BETAPACE   warfarin 2 MG tablet Commonly known as:  COUMADIN     TAKE these medications   acetaminophen 500 MG tablet Commonly known as:  TYLENOL Take 2 tablets (1,000 mg total) by mouth 3 (three) times daily.   amiodarone 200 MG tablet Commonly known as:  PACERONE Take 1 tablet (200 mg total) by mouth 2 (two) times daily.   apixaban 5 MG Tabs tablet Commonly known as:  ELIQUIS Take 1 tablet (5 mg total) by mouth 2 (two) times daily.   Ascorbic Acid 500 MG Caps Take 500 mg by mouth 2 (two) times daily.   bisacodyl 10 MG suppository Commonly known as:  DULCOLAX Place 1 suppository (10 mg total) rectally daily as needed for moderate constipation.   CO Q 10 PO Take 100 mg by mouth 2 (two) times daily.   collagenase ointment Commonly known as:  SANTYL Apply topically daily. Apply Santyl to right posterior rib area Q day, then cover with moist 2X2 and foam dressing.  (Change foam dressing Q 3 days or PRN soiling.) Start taking on:  October 31, 2018   feeding supplement (ENSURE ENLIVE) Liqd Take 237 mLs by mouth 3 (three) times daily between meals.   fish oil-omega-3 fatty acids 1000 MG capsule Take 2 g by mouth daily.   metoprolol tartrate 50 MG tablet Commonly known as:  LOPRESSOR Take 1 tablet (50 mg total) by mouth 2 (two) times daily.   multivitamin tablet Take 1 tablet by mouth daily.   polyethylene glycol packet Commonly known as:  MIRALAX / GLYCOLAX Take 17 g by mouth daily.   predniSONE 10 MG tablet Commonly known as:  DELTASONE 3 tabs daily for 3 days, then 2 tabs daily for 3 days, then 1 tab daily for 3 days, then stop. Start taking on:  October 31, 2018   senna 8.6 MG Tabs tablet Commonly known as:  SENOKOT Take 2 tablets (17.2 mg total) by mouth daily. Start taking on:  October 31, 2018   VITAMIN D (CHOLECALCIFEROL)  PO Take 5,000 Units by mouth daily.   VITAMIN E PO Take 400 Units by mouth daily.       Contact information for follow-up providers    MD at SNF. Schedule an appointment as soon as possible for a visit.   Why:  To be seen in 2 to 3 days with repeat labs (CBC & BMP).       Sheilah Pigeon, PA-C Follow up on 11/26/2018.   Specialty:  Cardiology Why:  2:30 PM.  Keep prior appointment. Contact information: [address] [place] [address] [phone]        Myrene Galas, MD. Schedule an appointment as soon as possible for a visit in 1 week(s).   Specialty:  Orthopedic Surgery Why:  Postop follow-up, left hip hemiarthroplasty. Contact information: [address] [place] [address] [phone]        Tisovec, Adelfa Koh, MD. Schedule an appointment as soon as possible for a visit.   Specialty:  Internal Medicine Why:  Upon discharge from SNF. Contact information: [address] [place] [address] [phone]        Hillis Range, MD .   Specialty:  Cardiology Contact information: [address] [place] [address] [phone]            Contact information for after-discharge care    Destination    [facility]erred [facility] .   Service:  Skilled Nursing Contact information: [address] [place] [address] [phone]                 No Known Allergies    Procedures/Studies: Dg Chest 1 View  Result Date: 10/20/2018 CLINICAL DATA:  Fall from wheelchair today. EXAM: CHEST  1 VIEW COMPARISON:  Radiographs of November 06, 2010. FINDINGS: Stable cardiomegaly. Right-sided pacemaker is unchanged in position. Large hiatal hernia is noted. No pneumothorax or pleural effusion is noted. Both lungs are clear. The visualized skeletal structures are unremarkable. IMPRESSION: No acute cardiopulmonary abnormality seen. Large hiatal hernia is noted. Electronically Signed   By: Lupita Raider, M.D.   On: 10/20/2018 11:46   Dg Lumbar Spine Complete  Result Date: 10/20/2018 CLINICAL DATA:  Low back pain after fall from wheelchair. EXAM: LUMBAR SPINE - COMPLETE 4+ VIEW COMPARISON:  None. FINDINGS: Moderate levoscoliosis of lumbar spine is noted. Diffuse osteopenia is noted. Probable old L5 compression fracture is noted. No acute fracture or spondylolisthesis is noted. Severe degenerative disc disease is noted at L1-2, L2-3 and L3-4.  IMPRESSION: Probable old L5 compression fracture. Severe multilevel degenerative disc disease is noted. No definite acute abnormality seen in the lumbar spine. Electronically Signed   By: Lupita Raider, M.D.   On: 10/20/2018 11:48   Dg Elbow 2 Views Right  Result Date: 10/25/2018 CLINICAL DATA:  Fall.  Diffuse right elbow pain. EXAM: RIGHT ELBOW - 2 VIEW COMPARISON:  None. FINDINGS: Questionable radial head fracture. There are degenerative/arthropathic changes involving ulnar trochlear articulation has and the radial carpal articulation with marginal osteophytes and joint space narrowing. The apparent radial head fracture may be due to chronic degenerative changes and not an acute fracture. Suspect a joint effusion. There is mild posterior soft tissue swelling Bones are demineralized. IMPRESSION: 1. Limited study due to positioning. This is further limited by chronic degenerative changes of the elbow. 2. Possible radial head fracture and possible joint effusion. These findings could be further assessed high-resolution elbow CT without contrast. 3. Mild posterior soft tissue swelling. Electronically Signed   By: Amie Portland M.D.   On: 10/25/2018 08:47   Pelvis Portable  Result Date: 10/22/2018 CLINICAL DATA:  Post op left hip bipolar hemiarthroplasty. EXAM: PORTABLE PELVIS 1-2 VIEWS COMPARISON:  Left hip radiographs 10/20/2018. FINDINGS: 1115 hours. The bones are demineralized. Patient has undergone interval left hip bipolar hemiarthroplasty. The hardware  appears well positioned. No evidence of acute fracture or dislocation. There is soft tissue emphysema overlying the left greater trochanter. Mild right hip degenerative changes are present. IMPRESSION: No demonstrated complication following left hip bipolar hemiarthroplasty. Electronically Signed   By: Carey Bullocks M.D.   On: 10/22/2018 13:28   Dg Hip Unilat With Pelvis 2-3 Views Left  Result Date: 10/20/2018 CLINICAL DATA:  Left hip pain after fall out of wheelchair. EXAM: DG HIP (WITH OR WITHOUT PELVIS) 2-3V LEFT COMPARISON:  None. FINDINGS: Moderately displaced fracture is seen involving the proximal left femoral neck. Right hip appears normal. IMPRESSION: Moderately displaced proximal left femoral neck fracture. Electronically Signed   By: Lupita Raider, M.D.   On: 10/20/2018 11:50   Dg Femur Port Min 2 Views Left  Result Date: 10/22/2018 CLINICAL DATA:  Left hip bipolar hemiarthroplasty. EXAM: LEFT FEMUR PORTABLE 2 VIEWS COMPARISON:  Left hip radiographs 10/20/2018. Left knee radiographs 04/21/2006. FINDINGS: Interval left hip bipolar hemiarthroplasty without complication. Remote left total knee arthroplasty appears unchanged. No evidence of acute fracture or dislocation. There is some soft tissue emphysema in the proximal thigh and around the greater trochanter. IMPRESSION: No demonstrated complication following left hip bipolar hemiarthroplasty. Electronically Signed   By: Carey Bullocks M.D.   On: 10/22/2018 13:30      Subjective: Patient interviewed and examined along with spouse at bedside.  Patient denies complaints.  Had multiple BMs after MOM yesterday.  No pain in right upper extremity and able to move all his joints well.  Reduced right finger stiffness.  No pain in operated left hip.  Both report that he was up with PT yesterday.  As per RN, no acute issues noted.  No chest pain, dyspnea, dizziness, lightheadedness or palpitations.  Discharge Exam:  Vitals:   10/30/18 0228  10/30/18 0252 10/30/18 0848 10/30/18 0850  BP: 111/63 122/66  (!) 108/56  Pulse: 62 60 61 60  Resp:  (!) 21  20  Temp:  98.7 F (37.1 C)    TempSrc:  Oral    SpO2:  97%  97%  Weight:  80.3 kg    Height:  General exam: Pleasant elderly male, moderately built and frail, lying comfortably propped up in bed.  Oral mucosa moist. Respiratory system: Clear to auscultation.  No increased work of breathing.   Cardiovascular system: S1 & S2 heard, RRR. No JVD, murmurs, rubs, gallops or clicks. No pedal edema.  Telemetry personally reviewed: Patient reverted from A. fib to sinus rhythm on 2/21 at approximately 5:45 PM and has been in sinus rhythm since. Gastrointestinal system: Abdomen is nondistended, soft and nontender. No organomegaly or masses felt. Normal bowel sounds heard.   Central nervous system: Alert and oriented x2. No focal neurological deficits.    Extremities: Symmetric 5 x 5 power. Left hip postop dressing clean and dry.  No significant warmth or swelling of right elbow or wrist.  No painful range of movements of right elbow or wrist. Skin: No rashes, lesions or ulcers Psychiatry: Judgement and insight appear impaired. Mood & affect appropriate.     The results of significant diagnostics from this hospitalization (including imaging, microbiology, ancillary and laboratory) are listed below for reference.     Microbiology: Recent Results (from the past 240 hour(s))  MRSA PCR Screening     Status: Abnormal   Collection Time: 10/20/18  6:54 PM  Result Value Ref Range Status   MRSA by PCR POSITIVE (A) NEGATIVE Final    Comment:        The GeneXpert MRSA Assay (FDA approved for NASAL specimens only), is one component of a comprehensive MRSA colonization surveillance program. It is not intended to diagnose MRSA infection nor to guide or monitor treatment for MRSA infections. RESULT CALLED TO, READ BACK BY AND VERIFIED WITH: Darcella Cheshire RN 2209 10/20/18 A BROWNING Performed at  [facility], [address] [place], [place] [address]   Culture, blood (routine x 2)     Status: None (Preliminary result)   Collection Time: 10/25/18  9:05 AM  Result Value Ref Range Status   Specimen Description BLOOD LEFT ANTECUBITAL  Final   Special Requests   Final    BOTTLES DRAWN AEROBIC ONLY Blood Culture adequate volume Performed at [facility], [address], [place], [place] [address]    Culture NO GROWTH 4 DAYS  Final   Report Status PENDING  Incomplete  Culture, blood (routine x 2)     Status: None (Preliminary result)   Collection Time: 10/25/18  9:11 AM  Result Value Ref Range Status   Specimen Description BLOOD LEFT HAND  Final   Special Requests   Final    BOTTLES DRAWN AEROBIC ONLY Blood Culture adequate volume Performed at [facility], [address] [place], [place] [address]    Culture NO GROWTH 4 DAYS  Final   Report Status PENDING  Incomplete  Aerobic/Anaerobic Culture (surgical/deep wound)     Status: None (Preliminary result)   Collection Time: 10/25/18 12:30 PM  Result Value Ref Range Status   Specimen Description SYNOVIAL RIGHT ELBOW  Final   Special Requests NONE  Final   Gram Stain   Final    MODERATE WBC PRESENT, PREDOMINANTLY PMN NO ORGANISMS SEEN Performed at [facility], [address] [place], [place] [address]    Culture   Final    NO GROWTH 4 DAYS NO ANAEROBES ISOLATED; CULTURE IN PROGRESS FOR 5 DAYS   Report Status PENDING  Incomplete     Labs: CBC: Recent Labs  Lab 10/24/18 0441 10/25/18 0513 10/26/18 0408 10/27/18 0333  WBC 9.7 9.3  11.7* 11.1*  NEUTROABS 7.4 6.6 9.0* 9.0*  HGB 11.8* 10.4* 10.5* 10.4*  HCT 35.5* 32.1* 32.8* 32.2*  MCV 101.7* 101.9* 102.8* 101.3*  PLT 136* 139* 175 215   Basic Metabolic Panel: Recent Labs  Lab 10/24/18 0441 10/25/18 0513 10/26/18 0408 10/27/18 0333 10/29/18 0610  NA 136 137 136 135  --   K 3.7 3.6 4.0 4.1  --   CL 102 102 99 98  --   CO2 27 29 30 30    --   GLUCOSE 107* 102* 103* 103*  --   BUN 22 22 25* 23  --   CREATININE 0.61 0.60* 0.74 0.63 0.66  CALCIUM 7.9* 7.8* 8.2* 8.2*  --   MG 1.9 1.8 1.8 1.8  --   PHOS 1.9* 2.8 2.8 2.9  --    Liver Function Tests: Recent Labs  Lab 10/24/18 0441 10/25/18 0513 10/26/18 0408 10/27/18 0333  AST 28 27 31  34  ALT 18 18 22 24   ALKPHOS 44 37* 45 48  BILITOT 1.3* 0.6 0.7 1.0  PROT 5.2* 4.5* 5.3* 5.1*  ALBUMIN 2.2* 1.8* 1.8* 1.8*    I discussed in detail with patient's spouse at bedside, updated care and answered questions.  Time coordinating discharge: 40 minutes  SIGNED:  Marcellus Scott, MD, FACP, [facility]. [facility]  To contact the attending provider between 7A-7P or the covering provider during after hours 7P-7A, please log into the web site [email or web address] and access using universal Daisy password for that web site. If you do not have the password, please call the hospital operator.

## 2018-10-30 NOTE — Clinical Social Work Placement (Signed)
   CLINICAL SOCIAL WORK PLACEMENT  NOTE  Date:  10/30/2018  Patient Details  Name: [name] MRN: [id number] Date of Birth: [birth year]  Clinical Social Work is seeking post-discharge placement for this patient at the Skilled  Nursing Facility level of care (*CSW will initial, date and re-position this form in  chart as items are completed):      Patient/family provided with [facility] Clinical Social Work Department's list of facilities offering this level of care within the geographic area requested by the patient (or if unable, by the patient's family).  Yes   Patient/family informed of their freedom to choose among providers that offer the needed level of care, that participate in Medicare, Medicaid or managed care program needed by the patient, have an available bed and are willing to accept the patient.      Patient/family informed of [facility]'s ownership interest in [facility] and [facility], as well as of the fact that they are under no obligation to receive care at these facilities.  PASRR submitted to EDS on       PASRR number received on 10/22/18     Existing PASRR number confirmed on       FL2 transmitted to all facilities in geographic area requested by pt/family on 10/22/18     FL2 transmitted to all facilities within larger geographic area on       Patient informed that his/her managed care company has contracts with or will negotiate with certain facilities, including the following:        Yes   Patient/family informed of bed offers received.  Patient chooses bed at [facility]     Physician recommends and patient chooses bed at      Patient to be transferred to [facility] on 10/30/18.  Patient to be transferred to facility by [facility]     Patient family notified on 10/30/18 of transfer.  Name of family member notified:  Spouse, Lorene     PHYSICIAN       Additional Comment:     _______________________________________________ Maree Krabbe, LCSW 10/30/2018, 11:35 AM

## 2018-10-30 NOTE — Progress Notes (Signed)
   Progress Note  Patient Name: [name] Date of Encounter: 10/30/2018  Primary Cardiologist: Dr. Hillis Range  I reviewed the chart and recent cardiology follow-up notes.  Patient has converted back to sinus rhythm within the last 24 hours.  Telemetry shows sinus rhythm with occasional PVCs and PACs.  Current medications include Eliquis 5 mg twice daily, Lopressor 25 mg every 6 hours, and amiodarone 200 mg twice daily.  [facility] will sign off.   Medication Recommendations: Continue amiodarone 200 mg twice daily for now and modify Lopressor to 50 mg twice daily if tolerated.  Otherwise continue Eliquis. Other recommendations (labs, testing, etc): No additional inpatient cardiac testing is planned at this time. Follow up as an outpatient: Keep follow-up with Dr. [name] for management of pacemaker and follow-up of atrial fibrillation.  Signed, Nona Dell, MD  10/30/2018, 11:27 AM

## 2018-10-30 NOTE — Clinical Social Work Note (Signed)
Clinical Social Worker facilitated patient discharge including contacting patient family and facility to confirm patient discharge plans.  Clinical information faxed to facility and family agreeable with plan.  CSW arranged ambulance transport via [facility] to [facility].  RN to call [phone] for report prior to discharge.  Clinical Social Worker will sign off for now as social work intervention is no longer needed. Please consult us again if new need arises.  Travin Marik, LCSWA [phone]  

## 2018-10-30 NOTE — Progress Notes (Signed)
Report called to Florentina Addison, receiving nurse at [facility]. All questions answered. Patient awaiting [facility] transport.

## 2018-11-03 DIAGNOSIS — S72002D Fracture of unspecified part of neck of left femur, subsequent encounter for closed fracture with routine healing: Secondary | ICD-10-CM | POA: Diagnosis not present

## 2018-11-03 DIAGNOSIS — M1120 Other chondrocalcinosis, unspecified site: Secondary | ICD-10-CM | POA: Diagnosis not present

## 2018-11-03 DIAGNOSIS — I10 Essential (primary) hypertension: Secondary | ICD-10-CM | POA: Diagnosis not present

## 2018-11-03 DIAGNOSIS — I4891 Unspecified atrial fibrillation: Secondary | ICD-10-CM | POA: Diagnosis not present

## 2018-11-04 NOTE — Op Note (Signed)
10/25/2018  PATIENT:  [name]  85 y.o. male  PRE-OPERATIVE DIAGNOSIS: right elbow effusion  POST-OPERATIVE DIAGNOSIS:  right elbow effusion  PROCEDURE:  Aspiration of right elbow  SURGEON:  Surgeon(s) and Role:    Myrene Galas, MD - Primary  PHYSICIAN ASSISTANT: Montez Morita, PA-C  EBL:  >10 mL of viscous but somewhat clear fluid  SPECIMEN:  Source of Specimen:  elbow joint right  DISPOSITION OF SPECIMEN:  micro  No complications.  Myrene Galas, MD [facility], [facility] [phone]

## 2018-11-04 NOTE — Op Note (Signed)
10/22/2018  5:56 PM  PATIENT:  [name]  85 y.o. male  PRE-OPERATIVE DIAGNOSIS:  LEFT FEMORAL NECK FRACTURE  POST-OPERATIVE DIAGNOSIS: LEFT FEMORAL NECK FRACTURE  PROCEDURE:  Procedure(s): ARTHROPLASTY UNIPOLAR HIP (HEMIARTHROPLASTY) (Left) with [employer]plants  SURGEON:  Surgeon(s) and Role:    Myrene Galas, MD - Primary  PHYSICIAN ASSISTANT: Montez Morita, PA-C   ANESTHESIA:   general  EBL:  100 mL   BLOOD ADMINISTERED:none  DRAINS: none   LOCAL MEDICATIONS USED:  NONE  SPECIMEN:  No Specimen  DISPOSITION OF SPECIMEN:  N/A  COUNTS:  YES  TOURNIQUET:  * No tourniquets in log *  DICTATION: .Note written in EPIC  PLAN OF CARE: Admit to inpatient   PATIENT DISPOSITION:  PACU - hemodynamically stable.   Delay start of Pharmacological VTE agent (>24hrs) due to surgical blood loss or risk of bleeding: no  BRIEF SUMMARY OF INDICATION FOR PROCEDURE:  [name] is a very pleasant 85 y.o. with dementia, who sustained an unwitnessed fall producing inability to bear weight, shortening, and external rotation of the extremity.  He was seen and evaluated with the recommendation for hemiarthroplasty. I discussed with the patient and family the risks and benefits, inclding the potential for leg length inequality, dislocation or instability, arthritis, loss of motion, DVT, PE, heart attack, stroke, and death.  Consent was given to proceed.  BRIEF SUMMARY OF PROCEDURE:  The patient was taken to the operating room where general anesthesia was induced and after administration of preoperative antibiotics consisting of 2 g of Ancef.  He was positioned with the left side up and all prominences were padded appropriately.  We made a 10 cm incision after the time-out, carrying dissection down to the IT band, was split in line with the skin.  Cerebellar retractor was placed and we were able to then flex and internally rotate the hip releasing the  piriformis and short rotators at their insertions.  The capsule was then T'd, tagging the corners with #1 Vicryl.  The neck cut was refined using a cutting guide and then this was followed by removal of the head, which sized perfectly to mm. Acetabular trials were placed, confirming this size as the best fit. Mueller and Cobra retractors were placed along the proximal femur, which was then prepared with the canal finder, then lateralizer, followed by reamers and the broaches, achieving  outstanding fit and fill with the final broach.  The calcar reamer was used to refine the cut as we were using a low demand stem.  The canal was irrigated thoroughly and the acetabulum once again searched multiple times for fragments and irrigated thoroughly.  Trial components were placed and the patient had outstanding stability in combine 90 degrees of flexion, adduction, and internal rotation as well as in external rotation and extension.  Consequently, actual components were placed.  My assistant Montez Morita, was necessary for delivery and control of the proximal femur during preparation, also during relocation and dislocation of the trial components as well as relocation of the actual components.  He assisted me with wound closure as well.  I did repair the capsule with #1 Vicryl and then used #2 FiberWire through bone tunnels to repair the short rotators and piriformis.  This was followed by a #1 Vicryl for the IT band and lastly 2-0 Vicryl and nylon for the subcutaneous and skin.  Sterile gently compressive dressing was applied.  The patient was awakened from anesthesia and transported to the  PACU in stable condition.  PROGNOSIS:  The patient will be weightbearing as tolerated with posterior hip precautions.  Patient has an elevated risk of complications related to declining overall health and mobility.  Donald Howells Denz remains on the Medical Service and will be on DVT prophylaxis mechanically  and with Eliquis while in the hospital, but will likely not be a candidate for long-term prophylaxis given the dementia and falls.     Doralee Albino. Carola Frost, M.D.

## 2018-11-09 ENCOUNTER — Encounter: Payer: Self-pay | Admitting: Internal Medicine

## 2018-11-13 DIAGNOSIS — D72829 Elevated white blood cell count, unspecified: Secondary | ICD-10-CM | POA: Diagnosis not present

## 2018-11-13 DIAGNOSIS — D539 Nutritional anemia, unspecified: Secondary | ICD-10-CM | POA: Diagnosis not present

## 2018-11-13 DIAGNOSIS — I4891 Unspecified atrial fibrillation: Secondary | ICD-10-CM | POA: Diagnosis not present

## 2018-11-13 DIAGNOSIS — S72002D Fracture of unspecified part of neck of left femur, subsequent encounter for closed fracture with routine healing: Secondary | ICD-10-CM | POA: Diagnosis not present

## 2018-11-16 DIAGNOSIS — D72829 Elevated white blood cell count, unspecified: Secondary | ICD-10-CM | POA: Diagnosis not present

## 2018-11-16 DIAGNOSIS — L89130 Pressure ulcer of right lower back, unstageable: Secondary | ICD-10-CM | POA: Diagnosis not present

## 2018-11-16 DIAGNOSIS — D539 Nutritional anemia, unspecified: Secondary | ICD-10-CM | POA: Diagnosis not present

## 2018-11-17 DIAGNOSIS — S72032D Displaced midcervical fracture of left femur, subsequent encounter for closed fracture with routine healing: Secondary | ICD-10-CM | POA: Diagnosis not present

## 2018-11-19 ENCOUNTER — Telehealth: Payer: Self-pay | Admitting: Internal Medicine

## 2018-11-19 NOTE — Telephone Encounter (Signed)
New message   Patient's wife wants to see  if a form for Eliquis from [facility] has been received. She states that it was sent on 11/15/2018 to the fax # [phone]. The patient's wife states that this form has to be mailed back to the patient's address on file and the patient will need the original signature of the doctor on the form. Please advise.

## 2018-11-19 NOTE — Telephone Encounter (Signed)
The provider part of the pts [facility]MS pt assitance application was found in Dr Amedeo Plenty mail bin. I have advised the pts wife that Dr [name] will be back in the office on Wednesday 3/18 and will sign the application at that time. She is concerned that the pt might run out of Eliquis while waiting for [facility] pt asst. I have advised her to call us if he gets low on his Eliquis and that if we have samples we will give him a bottle or 2 until his Eliquis arrives.  She verbalized understanding and thanked me for calling her back.  I have completed the provider part of the application and placed it in Dr Jenel Lucks mail bin awaiting his signature.

## 2018-11-24 DIAGNOSIS — R4189 Other symptoms and signs involving cognitive functions and awareness: Secondary | ICD-10-CM | POA: Diagnosis not present

## 2018-11-24 DIAGNOSIS — M25561 Pain in right knee: Secondary | ICD-10-CM | POA: Diagnosis not present

## 2018-11-24 NOTE — Telephone Encounter (Signed)
**Note De-Identified  Obfuscation** Dr [name] has signed the application and per the pts wife's request I have mailed it to the pts address.

## 2018-11-25 ENCOUNTER — Other Ambulatory Visit: Payer: Self-pay | Admitting: *Deleted

## 2018-11-25 NOTE — Patient Outreach (Signed)
[facility]) Care Management  11/25/2018  ARNIS NAKANISHI 10/13/1928 [id number]   Collaboration with [facility] after [facility] IDT telephonic meeting.  There were no [facility] Care Management needs identified.  Will continue to collaborate with [facility] and will make appropriate [facility] Care Management referrals if needed.   Raiford Noble, MSN-Ed, RN,[name] [facility] Post Acute Care Coordinator [phone]

## 2018-11-26 ENCOUNTER — Encounter: Payer: Medicare Other | Admitting: Physician Assistant

## 2018-11-30 DIAGNOSIS — D539 Nutritional anemia, unspecified: Secondary | ICD-10-CM | POA: Diagnosis not present

## 2018-11-30 DIAGNOSIS — S72002D Fracture of unspecified part of neck of left femur, subsequent encounter for closed fracture with routine healing: Secondary | ICD-10-CM | POA: Diagnosis not present

## 2018-11-30 DIAGNOSIS — I119 Hypertensive heart disease without heart failure: Secondary | ICD-10-CM | POA: Diagnosis not present

## 2018-11-30 DIAGNOSIS — E43 Unspecified severe protein-calorie malnutrition: Secondary | ICD-10-CM | POA: Diagnosis not present

## 2018-12-02 ENCOUNTER — Telehealth: Payer: Self-pay | Admitting: Internal Medicine

## 2018-12-02 NOTE — Telephone Encounter (Signed)
Patient calling the office for samples of medication: ° ° °1.  What medication and dosage are you requesting samples for? ° °apixaban (ELIQUIS) 5 MG TABS tablet ° °2.  Are you currently out of this medication?  yes ° ° °

## 2018-12-02 NOTE — Telephone Encounter (Signed)
Spoke with patients wife, DPR on file and she stated that the patient has been approved for patient assistance but they haven't received a shipment yet. She is going to call [facility] today for f/u on the status. I made her aware that I could provide two weeks supply of samples. She verbalized her understanding and appreciation.

## 2018-12-07 ENCOUNTER — Encounter: Payer: Medicare Other | Admitting: *Deleted

## 2018-12-07 ENCOUNTER — Other Ambulatory Visit: Payer: Self-pay

## 2018-12-08 ENCOUNTER — Telehealth: Payer: Self-pay

## 2018-12-08 NOTE — Telephone Encounter (Signed)
Spoke with patient to remind of missed remote transmission 

## 2018-12-13 ENCOUNTER — Other Ambulatory Visit: Payer: Self-pay

## 2018-12-13 ENCOUNTER — Telehealth: Payer: Self-pay

## 2018-12-13 NOTE — Telephone Encounter (Signed)
Received notification from [facility] that Pt is eligible to receive Eliquis free of charge from 12/10/2018 through 09/08/2019.

## 2018-12-13 NOTE — Patient Outreach (Signed)
[facility]) Care Management  12/13/2018  DORSE COUVERTIER 08-Aug-1929 [id number]    EMMI-General Discharge RED ON EMMI ALERT Day # 1 Date: 12/11/2018 Red Alert Reason: " Scheduled follow-up? No  Wounds healing well? No"   Outreach attempt # 1 to patient.  No answer. RN CM left HIPAA compliant voicemail message along with contact info.    Plan: RN CM will make outreach attempt to patient within 3-4 business days. RN CM will send unsuccessful outreach letter to patient.  Antionette Fairy, RN,[name],CCM [facility] Care Management Telephonic Care Management Coordinator Direct Phone: [phone] Toll Free: [phone] Fax: [phone]

## 2018-12-14 ENCOUNTER — Other Ambulatory Visit: Payer: Self-pay

## 2018-12-14 NOTE — Patient Outreach (Signed)
[facility]) Care Management  12/14/2018  [name] [birth year] [id number]   EMMI-General Discharge RED ON EMMI ALERT Day # 1 Date: 12/11/2018 Red Alert Reason: " Scheduled follow-up? No  Wounds healing well? No"   Outreach attempt #2 to patient. Spoke with spouse(DPR on file). spouse voices that she handles patient's affair. Patient is completely bedridden per spouse. Reviewed and addressed red alerts with spouse. She voices that patient is no longer going to Md appts as it is too much on both of them to get him out of the home. She reports that patient is active with hospice care. She voices that she has some neighbors assisting her as well. She denies any RN CM needs or concerns at this time.      Plan: RN CM will close case at this time as no further interventions needed.   Antionette Fairy, RN,[name],CCM [facility] Care Management Telephonic Care Management Coordinator Direct Phone: [phone] Toll Free: [phone] Fax: [phone]

## 2018-12-15 ENCOUNTER — Encounter: Payer: Self-pay | Admitting: Cardiology

## 2018-12-15 ENCOUNTER — Other Ambulatory Visit: Payer: Self-pay

## 2018-12-15 NOTE — Patient Outreach (Signed)
[facility]) Care Management  12/15/2018  [name] [birth year] [id number]    EMMI-General Discharge RED ON EMMI ALERT Day # 4 Date:12/14/2018 Red Alert Reason: "Scheduled follow up? No"   Red on EMMI dashboard received. No outreach call warranted to patient at this time. RN CM addressed issue on previous call. Spouse reported that patient is bedridden, active with hospice care and no longer going to MD appts.       Plan: RN CM will close case as no further interventions needed at this time.   Antionette Fairy, RN,[name],CCM [facility] Care Management Telephonic Care Management Coordinator Direct Phone: [phone] Toll Free: [phone] Fax: [phone]

## 2018-12-16 ENCOUNTER — Ambulatory Visit (INDEPENDENT_AMBULATORY_CARE_PROVIDER_SITE_OTHER): Payer: Medicare Other | Admitting: *Deleted

## 2018-12-16 DIAGNOSIS — I495 Sick sinus syndrome: Secondary | ICD-10-CM | POA: Diagnosis not present

## 2018-12-22 ENCOUNTER — Other Ambulatory Visit: Payer: Self-pay

## 2018-12-22 LAB — CUP PACEART REMOTE DEVICE CHECK
Date Time Interrogation Session: 20200415091451
Implantable Lead Implant Date: 20110802
Implantable Lead Implant Date: 20110802
Implantable Lead Location: 753859
Implantable Lead Location: 753860
Implantable Pulse Generator Implant Date: 20110801

## 2018-12-27 ENCOUNTER — Encounter: Payer: Self-pay | Admitting: Cardiology

## 2018-12-27 NOTE — Progress Notes (Signed)
Remote pacemaker transmission.   

## 2019-01-04 ENCOUNTER — Telehealth: Payer: Self-pay

## 2019-01-04 NOTE — Telephone Encounter (Signed)
Left message in regards to appt on 01/05/19.

## 2019-01-05 ENCOUNTER — Telehealth: Payer: Medicare Other | Admitting: Internal Medicine

## 2019-01-06 ENCOUNTER — Encounter (HOSPITAL_COMMUNITY): Payer: Self-pay | Admitting: Orthopedic Surgery

## 2019-02-15 IMAGING — DX DG FEMUR 2+V PORT*L*
4 series · 4 of 4 positions shown · non-contrast
Comparison: Left hip radiographs 10/20/2018. Left knee radiographs
04/21/2006.

CLINICAL DATA: Left hip bipolar hemiarthroplasty.

EXAM:
LEFT FEMUR PORTABLE 2 VIEWS

[femur ap (1 of 2)]
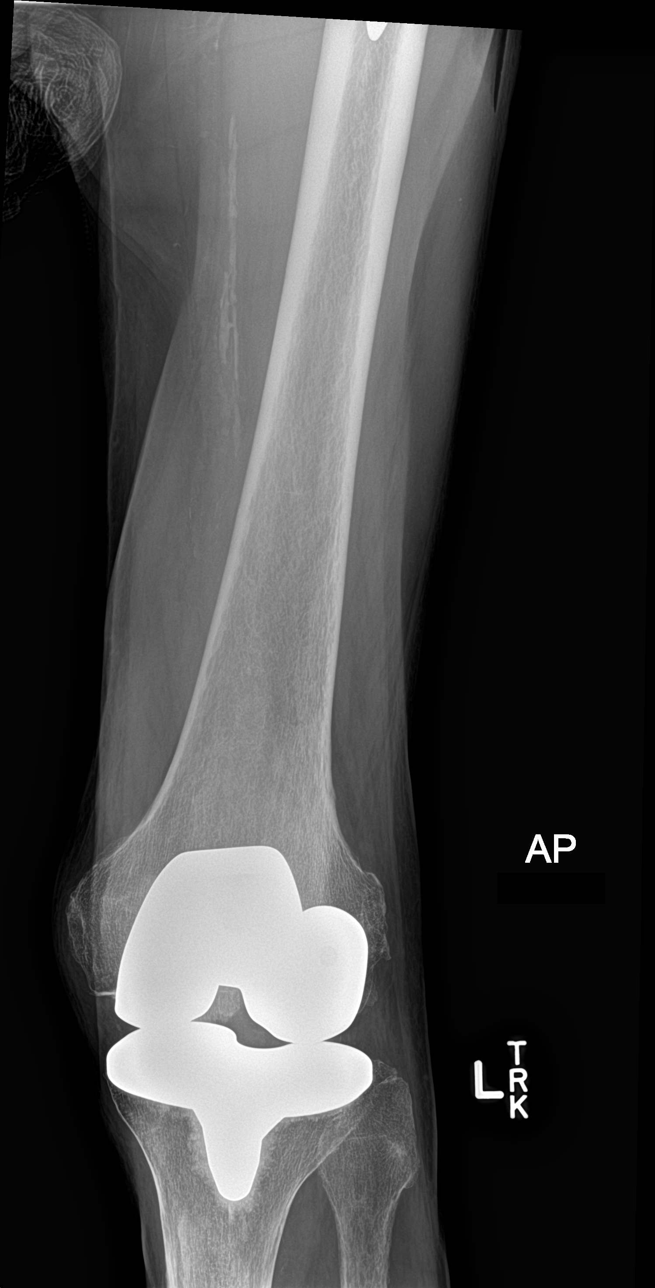

[femur ap (2 of 2)]
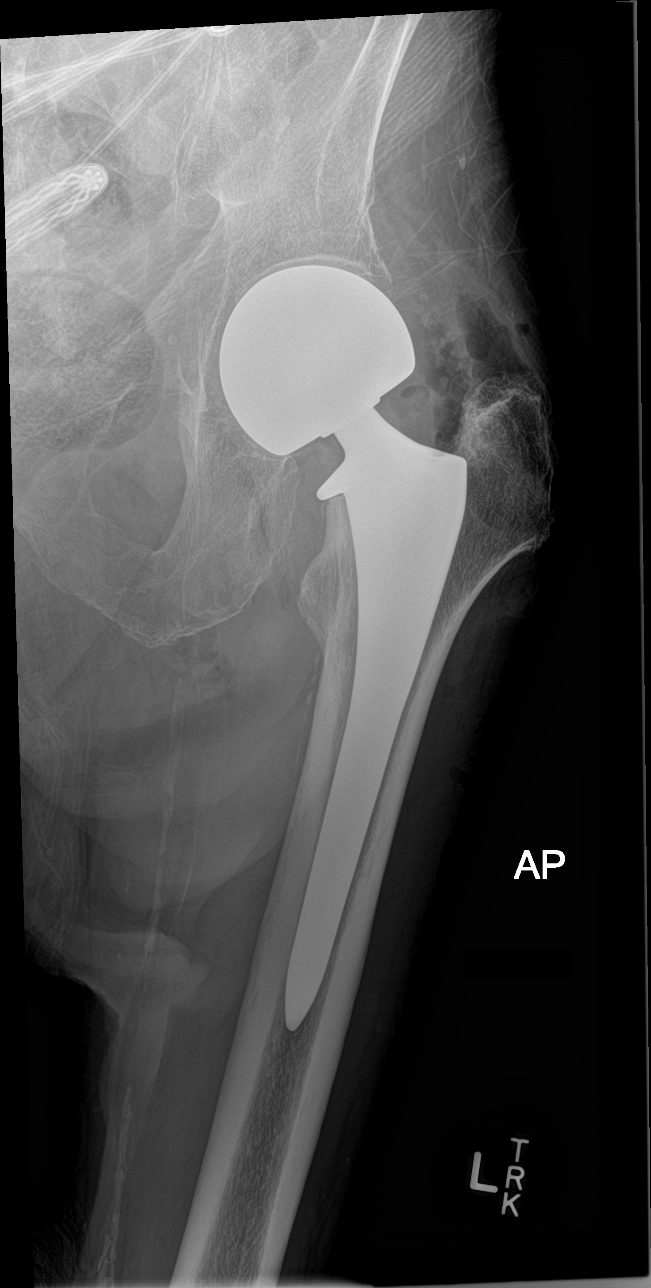

[femur lat (1 of 2)]
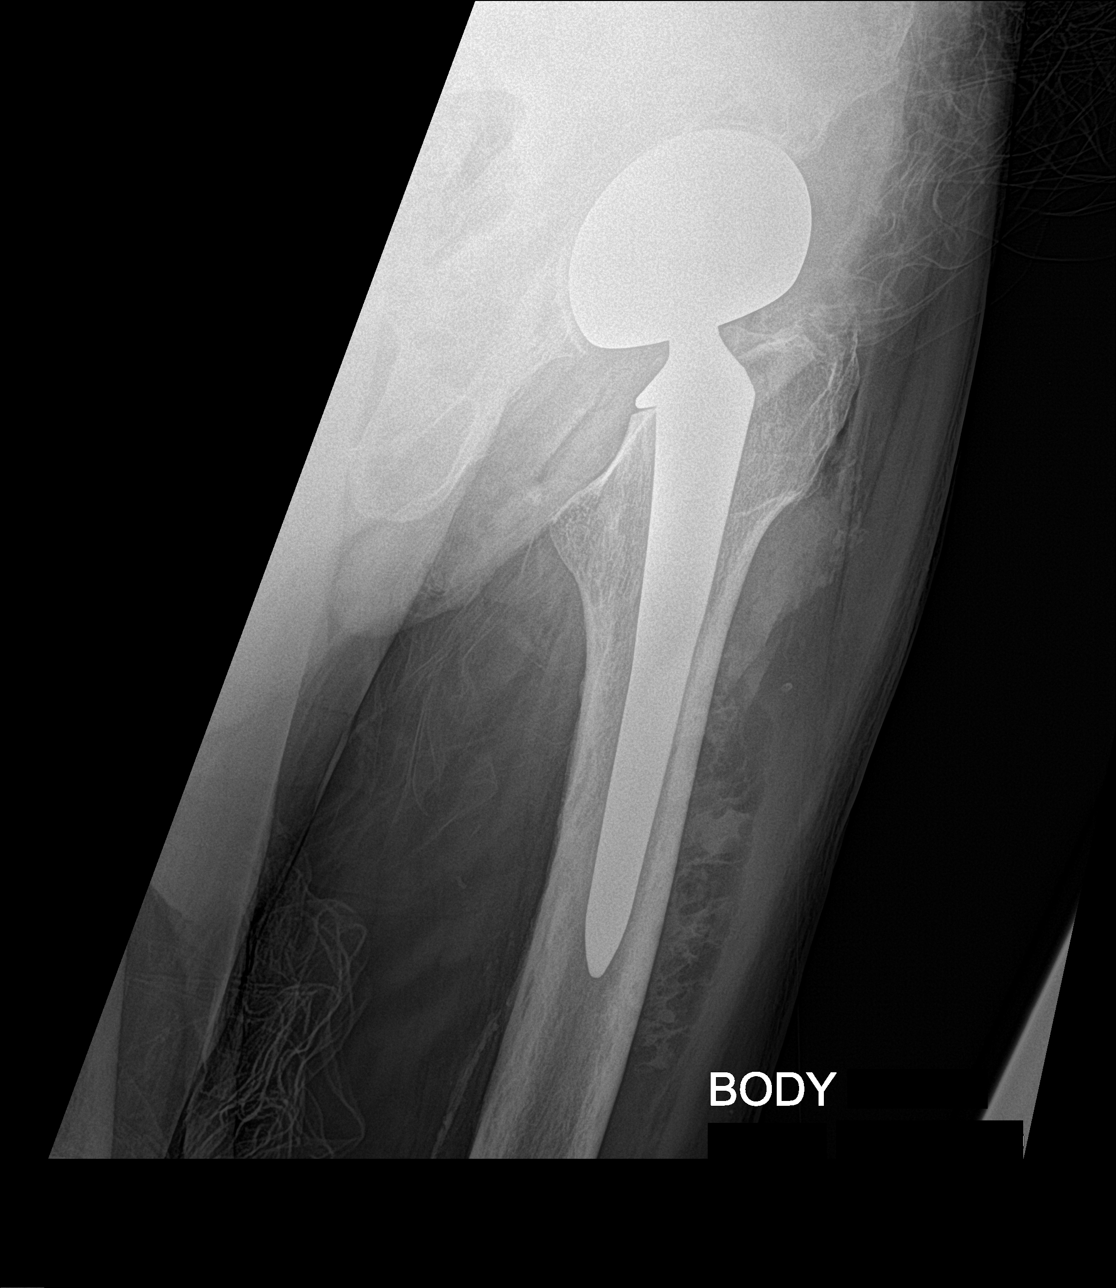

[femur lat (2 of 2)]
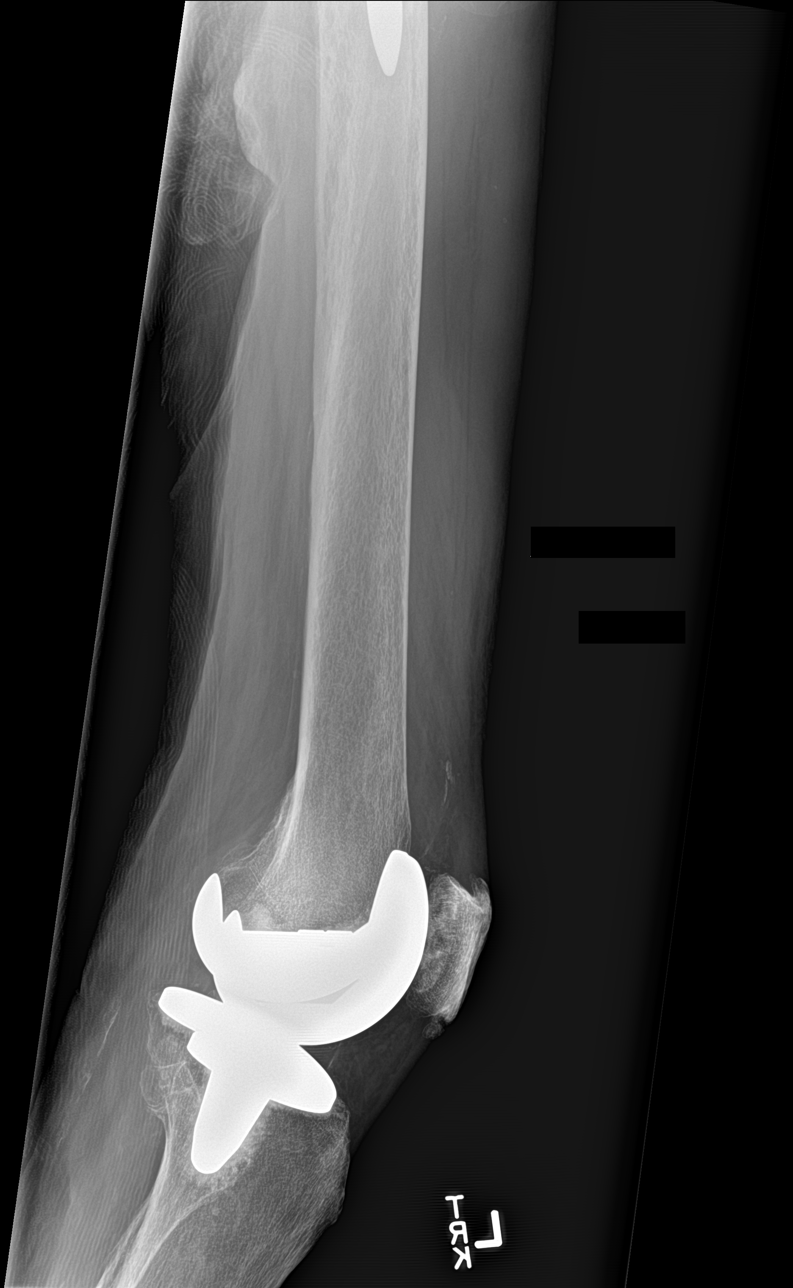

[4 of 4 positions shown; findings below may reference images not displayed]

FINDINGS: Interval left hip bipolar hemiarthroplasty without complication.
Remote left total knee arthroplasty appears unchanged. No evidence
of acute fracture or dislocation. There is some soft tissue
emphysema in the proximal thigh and around the greater trochanter.
IMPRESSION: No demonstrated complication following left hip bipolar
hemiarthroplasty.

## 2019-02-18 IMAGING — CR DG ELBOW 2V*R*
2 series · 2 of 2 positions shown · non-contrast
Comparison: None.

CLINICAL DATA: Fall.  Diffuse right elbow pain.

EXAM:
RIGHT ELBOW - 2 VIEW

[elbow ap]
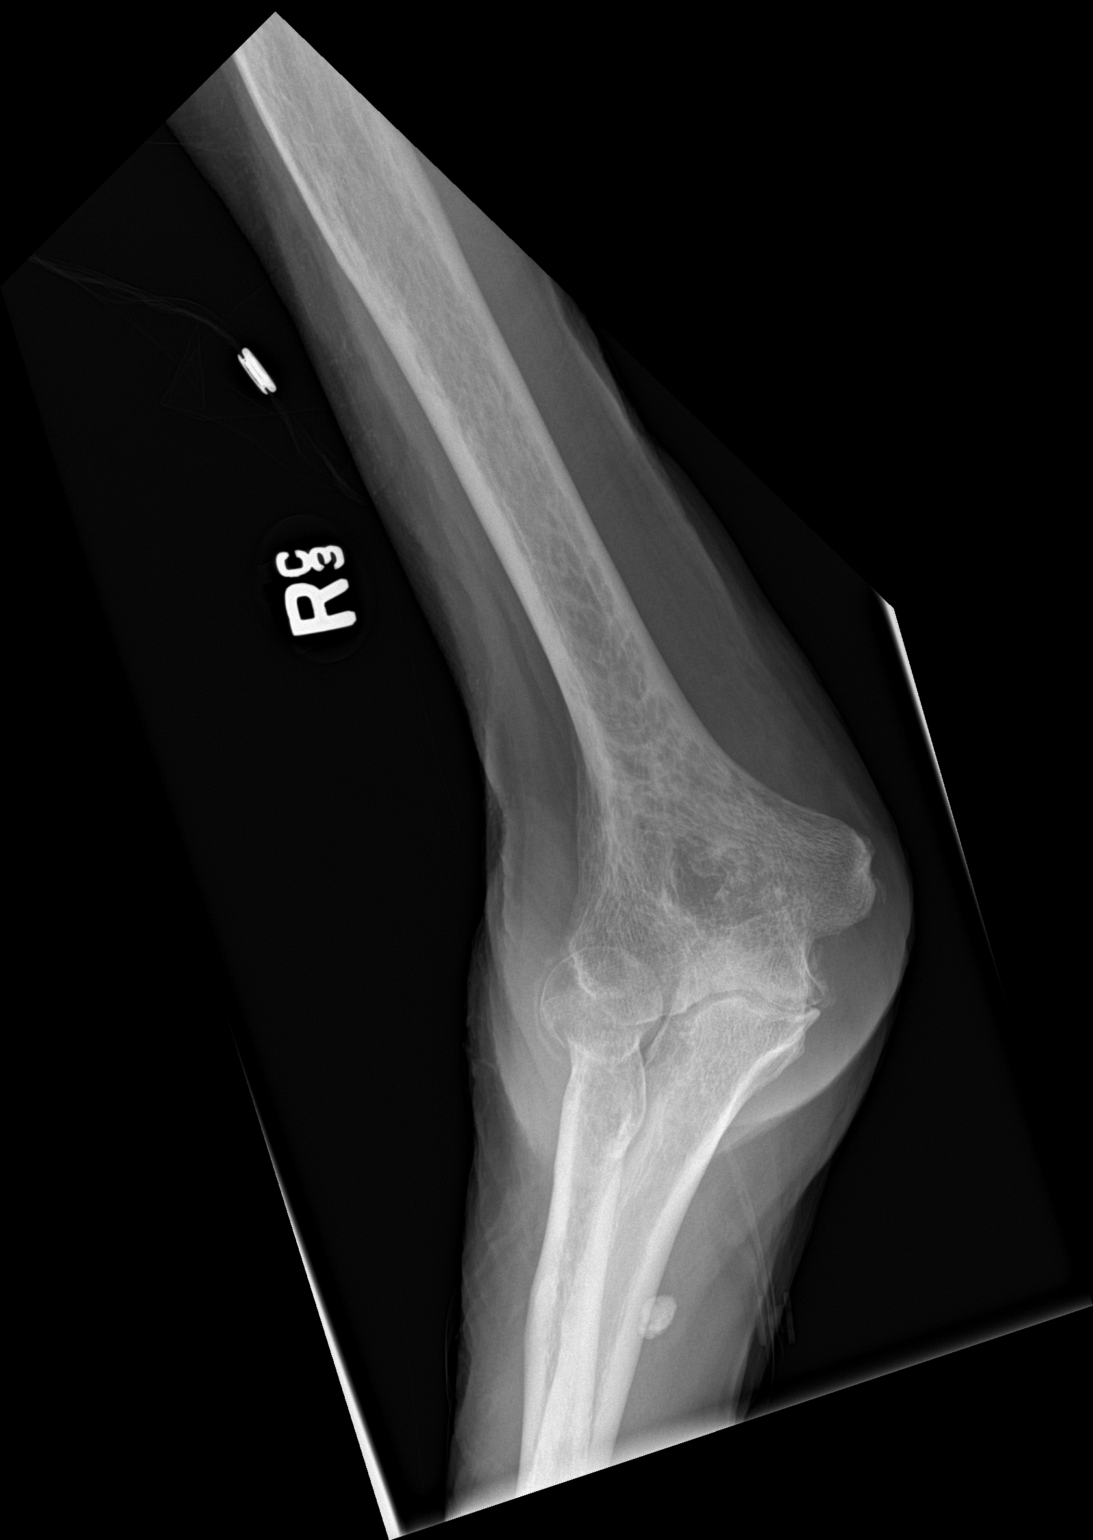

[elbow lat]
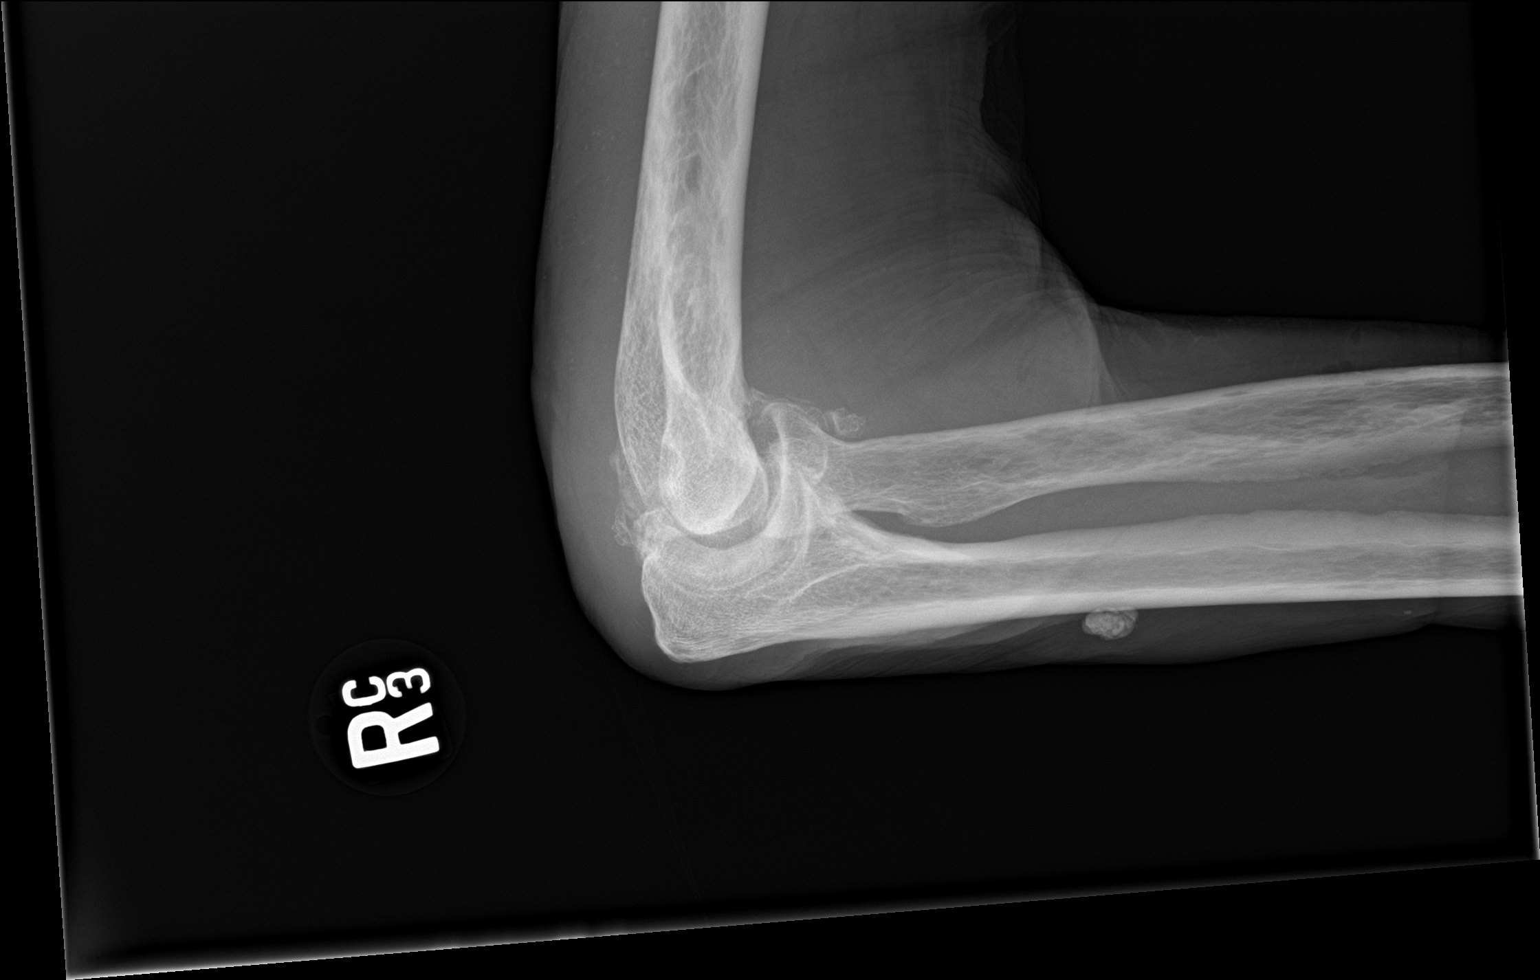

[2 of 2 positions shown; findings below may reference images not displayed]

FINDINGS: Questionable radial head fracture. There are
degenerative/arthropathic changes involving ulnar trochlear
articulation has and the radial carpal articulation with marginal
osteophytes and joint space narrowing. The apparent radial head
fracture may be due to chronic degenerative changes and not an acute
fracture.

Suspect a joint effusion.. There is mild posterior soft tissue
swelling

Bones are demineralized.
IMPRESSION: 1. Limited study due to positioning. This is further limited by
chronic degenerative changes of the elbow.
2. Possible radial head fracture and possible joint effusion. These
findings could be further assessed high-resolution elbow CT without
contrast.
3. Mild posterior soft tissue swelling.

## 2019-02-23 ENCOUNTER — Encounter: Payer: Medicare Other | Admitting: Vascular Surgery

## 2019-02-23 ENCOUNTER — Encounter (HOSPITAL_COMMUNITY): Payer: Medicare Other

## 2019-03-09 DIAGNOSIS — R229 Localized swelling, mass and lump, unspecified: Secondary | ICD-10-CM | POA: Diagnosis not present

## 2019-03-09 DIAGNOSIS — D649 Anemia, unspecified: Secondary | ICD-10-CM | POA: Diagnosis not present

## 2019-03-09 DIAGNOSIS — R627 Adult failure to thrive: Secondary | ICD-10-CM | POA: Diagnosis not present

## 2019-03-09 DIAGNOSIS — M40202 Unspecified kyphosis, cervical region: Secondary | ICD-10-CM | POA: Diagnosis not present

## 2019-03-09 DIAGNOSIS — M542 Cervicalgia: Secondary | ICD-10-CM | POA: Diagnosis not present

## 2019-03-09 DIAGNOSIS — M419 Scoliosis, unspecified: Secondary | ICD-10-CM | POA: Diagnosis not present

## 2019-03-09 DIAGNOSIS — Z950 Presence of cardiac pacemaker: Secondary | ICD-10-CM | POA: Diagnosis not present

## 2019-03-09 DIAGNOSIS — Z681 Body mass index (BMI) 19 or less, adult: Secondary | ICD-10-CM | POA: Diagnosis not present

## 2019-03-09 DIAGNOSIS — E43 Unspecified severe protein-calorie malnutrition: Secondary | ICD-10-CM | POA: Diagnosis not present

## 2019-03-09 DIAGNOSIS — S72002D Fracture of unspecified part of neck of left femur, subsequent encounter for closed fracture with routine healing: Secondary | ICD-10-CM | POA: Diagnosis not present

## 2019-03-09 DIAGNOSIS — L89890 Pressure ulcer of other site, unstageable: Secondary | ICD-10-CM | POA: Diagnosis not present

## 2019-03-09 DIAGNOSIS — Z96642 Presence of left artificial hip joint: Secondary | ICD-10-CM | POA: Diagnosis not present

## 2019-03-09 DIAGNOSIS — L89893 Pressure ulcer of other site, stage 3: Secondary | ICD-10-CM | POA: Diagnosis not present

## 2019-03-09 DIAGNOSIS — R634 Abnormal weight loss: Secondary | ICD-10-CM | POA: Diagnosis not present

## 2019-03-09 DIAGNOSIS — I739 Peripheral vascular disease, unspecified: Secondary | ICD-10-CM | POA: Diagnosis not present

## 2019-03-09 DIAGNOSIS — I4891 Unspecified atrial fibrillation: Secondary | ICD-10-CM | POA: Diagnosis not present

## 2019-03-09 DIAGNOSIS — I10 Essential (primary) hypertension: Secondary | ICD-10-CM | POA: Diagnosis not present

## 2019-03-09 DIAGNOSIS — M549 Dorsalgia, unspecified: Secondary | ICD-10-CM | POA: Diagnosis not present

## 2019-03-09 DIAGNOSIS — L89512 Pressure ulcer of right ankle, stage 2: Secondary | ICD-10-CM | POA: Diagnosis not present

## 2019-03-09 DIAGNOSIS — L89152 Pressure ulcer of sacral region, stage 2: Secondary | ICD-10-CM | POA: Diagnosis not present

## 2019-03-10 DIAGNOSIS — M40202 Unspecified kyphosis, cervical region: Secondary | ICD-10-CM | POA: Diagnosis not present

## 2019-03-10 DIAGNOSIS — M419 Scoliosis, unspecified: Secondary | ICD-10-CM | POA: Diagnosis not present

## 2019-03-10 DIAGNOSIS — E43 Unspecified severe protein-calorie malnutrition: Secondary | ICD-10-CM | POA: Diagnosis not present

## 2019-03-10 DIAGNOSIS — R627 Adult failure to thrive: Secondary | ICD-10-CM | POA: Diagnosis not present

## 2019-03-10 DIAGNOSIS — R634 Abnormal weight loss: Secondary | ICD-10-CM | POA: Diagnosis not present

## 2019-03-10 DIAGNOSIS — S72002D Fracture of unspecified part of neck of left femur, subsequent encounter for closed fracture with routine healing: Secondary | ICD-10-CM | POA: Diagnosis not present

## 2019-03-11 DIAGNOSIS — R634 Abnormal weight loss: Secondary | ICD-10-CM | POA: Diagnosis not present

## 2019-03-11 DIAGNOSIS — M40202 Unspecified kyphosis, cervical region: Secondary | ICD-10-CM | POA: Diagnosis not present

## 2019-03-11 DIAGNOSIS — M419 Scoliosis, unspecified: Secondary | ICD-10-CM | POA: Diagnosis not present

## 2019-03-11 DIAGNOSIS — E43 Unspecified severe protein-calorie malnutrition: Secondary | ICD-10-CM | POA: Diagnosis not present

## 2019-03-11 DIAGNOSIS — S72002D Fracture of unspecified part of neck of left femur, subsequent encounter for closed fracture with routine healing: Secondary | ICD-10-CM | POA: Diagnosis not present

## 2019-03-11 DIAGNOSIS — R627 Adult failure to thrive: Secondary | ICD-10-CM | POA: Diagnosis not present

## 2019-03-15 DIAGNOSIS — M40202 Unspecified kyphosis, cervical region: Secondary | ICD-10-CM | POA: Diagnosis not present

## 2019-03-15 DIAGNOSIS — R634 Abnormal weight loss: Secondary | ICD-10-CM | POA: Diagnosis not present

## 2019-03-15 DIAGNOSIS — S72002D Fracture of unspecified part of neck of left femur, subsequent encounter for closed fracture with routine healing: Secondary | ICD-10-CM | POA: Diagnosis not present

## 2019-03-15 DIAGNOSIS — M419 Scoliosis, unspecified: Secondary | ICD-10-CM | POA: Diagnosis not present

## 2019-03-15 DIAGNOSIS — E43 Unspecified severe protein-calorie malnutrition: Secondary | ICD-10-CM | POA: Diagnosis not present

## 2019-03-15 DIAGNOSIS — R627 Adult failure to thrive: Secondary | ICD-10-CM | POA: Diagnosis not present

## 2019-03-17 ENCOUNTER — Ambulatory Visit (INDEPENDENT_AMBULATORY_CARE_PROVIDER_SITE_OTHER): Payer: Medicare Other | Admitting: *Deleted

## 2019-03-17 DIAGNOSIS — I4819 Other persistent atrial fibrillation: Secondary | ICD-10-CM

## 2019-03-17 DIAGNOSIS — I495 Sick sinus syndrome: Secondary | ICD-10-CM

## 2019-03-18 ENCOUNTER — Telehealth: Payer: Self-pay

## 2019-03-18 DIAGNOSIS — R627 Adult failure to thrive: Secondary | ICD-10-CM | POA: Diagnosis not present

## 2019-03-18 DIAGNOSIS — S72002D Fracture of unspecified part of neck of left femur, subsequent encounter for closed fracture with routine healing: Secondary | ICD-10-CM | POA: Diagnosis not present

## 2019-03-18 DIAGNOSIS — E43 Unspecified severe protein-calorie malnutrition: Secondary | ICD-10-CM | POA: Diagnosis not present

## 2019-03-18 DIAGNOSIS — M419 Scoliosis, unspecified: Secondary | ICD-10-CM | POA: Diagnosis not present

## 2019-03-18 DIAGNOSIS — M40202 Unspecified kyphosis, cervical region: Secondary | ICD-10-CM | POA: Diagnosis not present

## 2019-03-18 DIAGNOSIS — R634 Abnormal weight loss: Secondary | ICD-10-CM | POA: Diagnosis not present

## 2019-03-18 NOTE — Telephone Encounter (Signed)
Spoke with patient to remind of missed remote transmission 

## 2019-03-21 LAB — CUP PACEART REMOTE DEVICE CHECK
Date Time Interrogation Session: 20200713084037
Implantable Lead Implant Date: 20110802
Implantable Lead Implant Date: 20110802
Implantable Lead Location: 753859
Implantable Lead Location: 753860
Implantable Pulse Generator Implant Date: 20110801

## 2019-03-22 DIAGNOSIS — R634 Abnormal weight loss: Secondary | ICD-10-CM | POA: Diagnosis not present

## 2019-03-22 DIAGNOSIS — E43 Unspecified severe protein-calorie malnutrition: Secondary | ICD-10-CM | POA: Diagnosis not present

## 2019-03-22 DIAGNOSIS — S72002D Fracture of unspecified part of neck of left femur, subsequent encounter for closed fracture with routine healing: Secondary | ICD-10-CM | POA: Diagnosis not present

## 2019-03-22 DIAGNOSIS — M419 Scoliosis, unspecified: Secondary | ICD-10-CM | POA: Diagnosis not present

## 2019-03-22 DIAGNOSIS — R627 Adult failure to thrive: Secondary | ICD-10-CM | POA: Diagnosis not present

## 2019-03-22 DIAGNOSIS — M40202 Unspecified kyphosis, cervical region: Secondary | ICD-10-CM | POA: Diagnosis not present

## 2019-03-25 DIAGNOSIS — R627 Adult failure to thrive: Secondary | ICD-10-CM | POA: Diagnosis not present

## 2019-03-25 DIAGNOSIS — E43 Unspecified severe protein-calorie malnutrition: Secondary | ICD-10-CM | POA: Diagnosis not present

## 2019-03-25 DIAGNOSIS — S72002D Fracture of unspecified part of neck of left femur, subsequent encounter for closed fracture with routine healing: Secondary | ICD-10-CM | POA: Diagnosis not present

## 2019-03-25 DIAGNOSIS — R634 Abnormal weight loss: Secondary | ICD-10-CM | POA: Diagnosis not present

## 2019-03-25 DIAGNOSIS — M40202 Unspecified kyphosis, cervical region: Secondary | ICD-10-CM | POA: Diagnosis not present

## 2019-03-25 DIAGNOSIS — M419 Scoliosis, unspecified: Secondary | ICD-10-CM | POA: Diagnosis not present

## 2019-03-28 NOTE — Progress Notes (Signed)
Remote pacemaker transmission.   

## 2019-03-29 DIAGNOSIS — E43 Unspecified severe protein-calorie malnutrition: Secondary | ICD-10-CM | POA: Diagnosis not present

## 2019-03-29 DIAGNOSIS — S72002D Fracture of unspecified part of neck of left femur, subsequent encounter for closed fracture with routine healing: Secondary | ICD-10-CM | POA: Diagnosis not present

## 2019-03-29 DIAGNOSIS — M40202 Unspecified kyphosis, cervical region: Secondary | ICD-10-CM | POA: Diagnosis not present

## 2019-03-29 DIAGNOSIS — R627 Adult failure to thrive: Secondary | ICD-10-CM | POA: Diagnosis not present

## 2019-03-29 DIAGNOSIS — M419 Scoliosis, unspecified: Secondary | ICD-10-CM | POA: Diagnosis not present

## 2019-03-29 DIAGNOSIS — R634 Abnormal weight loss: Secondary | ICD-10-CM | POA: Diagnosis not present

## 2019-03-31 DIAGNOSIS — E43 Unspecified severe protein-calorie malnutrition: Secondary | ICD-10-CM | POA: Diagnosis not present

## 2019-03-31 DIAGNOSIS — R634 Abnormal weight loss: Secondary | ICD-10-CM | POA: Diagnosis not present

## 2019-03-31 DIAGNOSIS — S72002D Fracture of unspecified part of neck of left femur, subsequent encounter for closed fracture with routine healing: Secondary | ICD-10-CM | POA: Diagnosis not present

## 2019-03-31 DIAGNOSIS — R627 Adult failure to thrive: Secondary | ICD-10-CM | POA: Diagnosis not present

## 2019-03-31 DIAGNOSIS — M40202 Unspecified kyphosis, cervical region: Secondary | ICD-10-CM | POA: Diagnosis not present

## 2019-03-31 DIAGNOSIS — M419 Scoliosis, unspecified: Secondary | ICD-10-CM | POA: Diagnosis not present

## 2019-04-01 DIAGNOSIS — S72002D Fracture of unspecified part of neck of left femur, subsequent encounter for closed fracture with routine healing: Secondary | ICD-10-CM | POA: Diagnosis not present

## 2019-04-01 DIAGNOSIS — E43 Unspecified severe protein-calorie malnutrition: Secondary | ICD-10-CM | POA: Diagnosis not present

## 2019-04-01 DIAGNOSIS — M419 Scoliosis, unspecified: Secondary | ICD-10-CM | POA: Diagnosis not present

## 2019-04-01 DIAGNOSIS — M40202 Unspecified kyphosis, cervical region: Secondary | ICD-10-CM | POA: Diagnosis not present

## 2019-04-01 DIAGNOSIS — R627 Adult failure to thrive: Secondary | ICD-10-CM | POA: Diagnosis not present

## 2019-04-01 DIAGNOSIS — R634 Abnormal weight loss: Secondary | ICD-10-CM | POA: Diagnosis not present

## 2019-04-05 DIAGNOSIS — M40202 Unspecified kyphosis, cervical region: Secondary | ICD-10-CM | POA: Diagnosis not present

## 2019-04-05 DIAGNOSIS — M419 Scoliosis, unspecified: Secondary | ICD-10-CM | POA: Diagnosis not present

## 2019-04-05 DIAGNOSIS — S72002D Fracture of unspecified part of neck of left femur, subsequent encounter for closed fracture with routine healing: Secondary | ICD-10-CM | POA: Diagnosis not present

## 2019-04-05 DIAGNOSIS — E43 Unspecified severe protein-calorie malnutrition: Secondary | ICD-10-CM | POA: Diagnosis not present

## 2019-04-05 DIAGNOSIS — R634 Abnormal weight loss: Secondary | ICD-10-CM | POA: Diagnosis not present

## 2019-04-05 DIAGNOSIS — R627 Adult failure to thrive: Secondary | ICD-10-CM | POA: Diagnosis not present

## 2019-04-08 DIAGNOSIS — R634 Abnormal weight loss: Secondary | ICD-10-CM | POA: Diagnosis not present

## 2019-04-08 DIAGNOSIS — M40202 Unspecified kyphosis, cervical region: Secondary | ICD-10-CM | POA: Diagnosis not present

## 2019-04-08 DIAGNOSIS — E43 Unspecified severe protein-calorie malnutrition: Secondary | ICD-10-CM | POA: Diagnosis not present

## 2019-04-08 DIAGNOSIS — R627 Adult failure to thrive: Secondary | ICD-10-CM | POA: Diagnosis not present

## 2019-04-08 DIAGNOSIS — S72002D Fracture of unspecified part of neck of left femur, subsequent encounter for closed fracture with routine healing: Secondary | ICD-10-CM | POA: Diagnosis not present

## 2019-04-08 DIAGNOSIS — M419 Scoliosis, unspecified: Secondary | ICD-10-CM | POA: Diagnosis not present

## 2019-04-09 DIAGNOSIS — Z950 Presence of cardiac pacemaker: Secondary | ICD-10-CM | POA: Diagnosis not present

## 2019-04-09 DIAGNOSIS — I4891 Unspecified atrial fibrillation: Secondary | ICD-10-CM | POA: Diagnosis not present

## 2019-04-09 DIAGNOSIS — M40202 Unspecified kyphosis, cervical region: Secondary | ICD-10-CM | POA: Diagnosis not present

## 2019-04-09 DIAGNOSIS — S72002D Fracture of unspecified part of neck of left femur, subsequent encounter for closed fracture with routine healing: Secondary | ICD-10-CM | POA: Diagnosis not present

## 2019-04-09 DIAGNOSIS — D649 Anemia, unspecified: Secondary | ICD-10-CM | POA: Diagnosis not present

## 2019-04-09 DIAGNOSIS — M542 Cervicalgia: Secondary | ICD-10-CM | POA: Diagnosis not present

## 2019-04-09 DIAGNOSIS — L89512 Pressure ulcer of right ankle, stage 2: Secondary | ICD-10-CM | POA: Diagnosis not present

## 2019-04-09 DIAGNOSIS — R627 Adult failure to thrive: Secondary | ICD-10-CM | POA: Diagnosis not present

## 2019-04-09 DIAGNOSIS — R229 Localized swelling, mass and lump, unspecified: Secondary | ICD-10-CM | POA: Diagnosis not present

## 2019-04-09 DIAGNOSIS — Z96642 Presence of left artificial hip joint: Secondary | ICD-10-CM | POA: Diagnosis not present

## 2019-04-09 DIAGNOSIS — Z681 Body mass index (BMI) 19 or less, adult: Secondary | ICD-10-CM | POA: Diagnosis not present

## 2019-04-09 DIAGNOSIS — E43 Unspecified severe protein-calorie malnutrition: Secondary | ICD-10-CM | POA: Diagnosis not present

## 2019-04-09 DIAGNOSIS — L89890 Pressure ulcer of other site, unstageable: Secondary | ICD-10-CM | POA: Diagnosis not present

## 2019-04-09 DIAGNOSIS — I10 Essential (primary) hypertension: Secondary | ICD-10-CM | POA: Diagnosis not present

## 2019-04-09 DIAGNOSIS — L89893 Pressure ulcer of other site, stage 3: Secondary | ICD-10-CM | POA: Diagnosis not present

## 2019-04-09 DIAGNOSIS — R634 Abnormal weight loss: Secondary | ICD-10-CM | POA: Diagnosis not present

## 2019-04-09 DIAGNOSIS — I739 Peripheral vascular disease, unspecified: Secondary | ICD-10-CM | POA: Diagnosis not present

## 2019-04-09 DIAGNOSIS — M419 Scoliosis, unspecified: Secondary | ICD-10-CM | POA: Diagnosis not present

## 2019-04-09 DIAGNOSIS — L89152 Pressure ulcer of sacral region, stage 2: Secondary | ICD-10-CM | POA: Diagnosis not present

## 2019-04-09 DIAGNOSIS — M549 Dorsalgia, unspecified: Secondary | ICD-10-CM | POA: Diagnosis not present

## 2019-04-12 DIAGNOSIS — M419 Scoliosis, unspecified: Secondary | ICD-10-CM | POA: Diagnosis not present

## 2019-04-12 DIAGNOSIS — R634 Abnormal weight loss: Secondary | ICD-10-CM | POA: Diagnosis not present

## 2019-04-12 DIAGNOSIS — M40202 Unspecified kyphosis, cervical region: Secondary | ICD-10-CM | POA: Diagnosis not present

## 2019-04-12 DIAGNOSIS — R627 Adult failure to thrive: Secondary | ICD-10-CM | POA: Diagnosis not present

## 2019-04-12 DIAGNOSIS — E43 Unspecified severe protein-calorie malnutrition: Secondary | ICD-10-CM | POA: Diagnosis not present

## 2019-04-12 DIAGNOSIS — S72002D Fracture of unspecified part of neck of left femur, subsequent encounter for closed fracture with routine healing: Secondary | ICD-10-CM | POA: Diagnosis not present

## 2019-04-15 DIAGNOSIS — S72002D Fracture of unspecified part of neck of left femur, subsequent encounter for closed fracture with routine healing: Secondary | ICD-10-CM | POA: Diagnosis not present

## 2019-04-15 DIAGNOSIS — M419 Scoliosis, unspecified: Secondary | ICD-10-CM | POA: Diagnosis not present

## 2019-04-15 DIAGNOSIS — R634 Abnormal weight loss: Secondary | ICD-10-CM | POA: Diagnosis not present

## 2019-04-15 DIAGNOSIS — R627 Adult failure to thrive: Secondary | ICD-10-CM | POA: Diagnosis not present

## 2019-04-15 DIAGNOSIS — E43 Unspecified severe protein-calorie malnutrition: Secondary | ICD-10-CM | POA: Diagnosis not present

## 2019-04-15 DIAGNOSIS — M40202 Unspecified kyphosis, cervical region: Secondary | ICD-10-CM | POA: Diagnosis not present

## 2019-04-18 ENCOUNTER — Encounter: Payer: Medicare Other | Admitting: *Deleted

## 2019-04-18 DIAGNOSIS — E43 Unspecified severe protein-calorie malnutrition: Secondary | ICD-10-CM | POA: Diagnosis not present

## 2019-04-18 DIAGNOSIS — S72002D Fracture of unspecified part of neck of left femur, subsequent encounter for closed fracture with routine healing: Secondary | ICD-10-CM | POA: Diagnosis not present

## 2019-04-18 DIAGNOSIS — M40202 Unspecified kyphosis, cervical region: Secondary | ICD-10-CM | POA: Diagnosis not present

## 2019-04-18 DIAGNOSIS — R627 Adult failure to thrive: Secondary | ICD-10-CM | POA: Diagnosis not present

## 2019-04-18 DIAGNOSIS — R634 Abnormal weight loss: Secondary | ICD-10-CM | POA: Diagnosis not present

## 2019-04-18 DIAGNOSIS — M419 Scoliosis, unspecified: Secondary | ICD-10-CM | POA: Diagnosis not present

## 2019-04-19 DIAGNOSIS — E43 Unspecified severe protein-calorie malnutrition: Secondary | ICD-10-CM | POA: Diagnosis not present

## 2019-04-19 DIAGNOSIS — R627 Adult failure to thrive: Secondary | ICD-10-CM | POA: Diagnosis not present

## 2019-04-19 DIAGNOSIS — M40202 Unspecified kyphosis, cervical region: Secondary | ICD-10-CM | POA: Diagnosis not present

## 2019-04-19 DIAGNOSIS — R634 Abnormal weight loss: Secondary | ICD-10-CM | POA: Diagnosis not present

## 2019-04-19 DIAGNOSIS — M419 Scoliosis, unspecified: Secondary | ICD-10-CM | POA: Diagnosis not present

## 2019-04-19 DIAGNOSIS — S72002D Fracture of unspecified part of neck of left femur, subsequent encounter for closed fracture with routine healing: Secondary | ICD-10-CM | POA: Diagnosis not present

## 2019-04-22 DIAGNOSIS — R627 Adult failure to thrive: Secondary | ICD-10-CM | POA: Diagnosis not present

## 2019-04-22 DIAGNOSIS — M40202 Unspecified kyphosis, cervical region: Secondary | ICD-10-CM | POA: Diagnosis not present

## 2019-04-22 DIAGNOSIS — M419 Scoliosis, unspecified: Secondary | ICD-10-CM | POA: Diagnosis not present

## 2019-04-22 DIAGNOSIS — S72002D Fracture of unspecified part of neck of left femur, subsequent encounter for closed fracture with routine healing: Secondary | ICD-10-CM | POA: Diagnosis not present

## 2019-04-22 DIAGNOSIS — R634 Abnormal weight loss: Secondary | ICD-10-CM | POA: Diagnosis not present

## 2019-04-22 DIAGNOSIS — E43 Unspecified severe protein-calorie malnutrition: Secondary | ICD-10-CM | POA: Diagnosis not present

## 2019-04-26 ENCOUNTER — Encounter: Payer: Self-pay | Admitting: Cardiology

## 2019-04-26 DIAGNOSIS — M419 Scoliosis, unspecified: Secondary | ICD-10-CM | POA: Diagnosis not present

## 2019-04-26 DIAGNOSIS — E43 Unspecified severe protein-calorie malnutrition: Secondary | ICD-10-CM | POA: Diagnosis not present

## 2019-04-26 DIAGNOSIS — M40202 Unspecified kyphosis, cervical region: Secondary | ICD-10-CM | POA: Diagnosis not present

## 2019-04-26 DIAGNOSIS — R634 Abnormal weight loss: Secondary | ICD-10-CM | POA: Diagnosis not present

## 2019-04-26 DIAGNOSIS — R627 Adult failure to thrive: Secondary | ICD-10-CM | POA: Diagnosis not present

## 2019-04-26 DIAGNOSIS — S72002D Fracture of unspecified part of neck of left femur, subsequent encounter for closed fracture with routine healing: Secondary | ICD-10-CM | POA: Diagnosis not present

## 2019-04-28 DIAGNOSIS — M419 Scoliosis, unspecified: Secondary | ICD-10-CM | POA: Diagnosis not present

## 2019-04-28 DIAGNOSIS — R634 Abnormal weight loss: Secondary | ICD-10-CM | POA: Diagnosis not present

## 2019-04-28 DIAGNOSIS — M40202 Unspecified kyphosis, cervical region: Secondary | ICD-10-CM | POA: Diagnosis not present

## 2019-04-28 DIAGNOSIS — S72002D Fracture of unspecified part of neck of left femur, subsequent encounter for closed fracture with routine healing: Secondary | ICD-10-CM | POA: Diagnosis not present

## 2019-04-28 DIAGNOSIS — E43 Unspecified severe protein-calorie malnutrition: Secondary | ICD-10-CM | POA: Diagnosis not present

## 2019-04-28 DIAGNOSIS — R627 Adult failure to thrive: Secondary | ICD-10-CM | POA: Diagnosis not present

## 2019-04-29 DIAGNOSIS — R627 Adult failure to thrive: Secondary | ICD-10-CM | POA: Diagnosis not present

## 2019-04-29 DIAGNOSIS — M40202 Unspecified kyphosis, cervical region: Secondary | ICD-10-CM | POA: Diagnosis not present

## 2019-04-29 DIAGNOSIS — S72002D Fracture of unspecified part of neck of left femur, subsequent encounter for closed fracture with routine healing: Secondary | ICD-10-CM | POA: Diagnosis not present

## 2019-04-29 DIAGNOSIS — R634 Abnormal weight loss: Secondary | ICD-10-CM | POA: Diagnosis not present

## 2019-04-29 DIAGNOSIS — E43 Unspecified severe protein-calorie malnutrition: Secondary | ICD-10-CM | POA: Diagnosis not present

## 2019-04-29 DIAGNOSIS — M419 Scoliosis, unspecified: Secondary | ICD-10-CM | POA: Diagnosis not present

## 2019-05-03 DIAGNOSIS — R634 Abnormal weight loss: Secondary | ICD-10-CM | POA: Diagnosis not present

## 2019-05-03 DIAGNOSIS — E43 Unspecified severe protein-calorie malnutrition: Secondary | ICD-10-CM | POA: Diagnosis not present

## 2019-05-03 DIAGNOSIS — S72002D Fracture of unspecified part of neck of left femur, subsequent encounter for closed fracture with routine healing: Secondary | ICD-10-CM | POA: Diagnosis not present

## 2019-05-03 DIAGNOSIS — R627 Adult failure to thrive: Secondary | ICD-10-CM | POA: Diagnosis not present

## 2019-05-03 DIAGNOSIS — M40202 Unspecified kyphosis, cervical region: Secondary | ICD-10-CM | POA: Diagnosis not present

## 2019-05-03 DIAGNOSIS — M419 Scoliosis, unspecified: Secondary | ICD-10-CM | POA: Diagnosis not present

## 2019-05-06 DIAGNOSIS — R627 Adult failure to thrive: Secondary | ICD-10-CM | POA: Diagnosis not present

## 2019-05-06 DIAGNOSIS — S72002D Fracture of unspecified part of neck of left femur, subsequent encounter for closed fracture with routine healing: Secondary | ICD-10-CM | POA: Diagnosis not present

## 2019-05-06 DIAGNOSIS — M40202 Unspecified kyphosis, cervical region: Secondary | ICD-10-CM | POA: Diagnosis not present

## 2019-05-06 DIAGNOSIS — M419 Scoliosis, unspecified: Secondary | ICD-10-CM | POA: Diagnosis not present

## 2019-05-06 DIAGNOSIS — R634 Abnormal weight loss: Secondary | ICD-10-CM | POA: Diagnosis not present

## 2019-05-06 DIAGNOSIS — E43 Unspecified severe protein-calorie malnutrition: Secondary | ICD-10-CM | POA: Diagnosis not present

## 2019-05-10 DIAGNOSIS — R634 Abnormal weight loss: Secondary | ICD-10-CM | POA: Diagnosis not present

## 2019-05-10 DIAGNOSIS — M542 Cervicalgia: Secondary | ICD-10-CM | POA: Diagnosis not present

## 2019-05-10 DIAGNOSIS — Z681 Body mass index (BMI) 19 or less, adult: Secondary | ICD-10-CM | POA: Diagnosis not present

## 2019-05-10 DIAGNOSIS — I10 Essential (primary) hypertension: Secondary | ICD-10-CM | POA: Diagnosis not present

## 2019-05-10 DIAGNOSIS — R229 Localized swelling, mass and lump, unspecified: Secondary | ICD-10-CM | POA: Diagnosis not present

## 2019-05-10 DIAGNOSIS — I4891 Unspecified atrial fibrillation: Secondary | ICD-10-CM | POA: Diagnosis not present

## 2019-05-10 DIAGNOSIS — M419 Scoliosis, unspecified: Secondary | ICD-10-CM | POA: Diagnosis not present

## 2019-05-10 DIAGNOSIS — Z96642 Presence of left artificial hip joint: Secondary | ICD-10-CM | POA: Diagnosis not present

## 2019-05-10 DIAGNOSIS — R627 Adult failure to thrive: Secondary | ICD-10-CM | POA: Diagnosis not present

## 2019-05-10 DIAGNOSIS — L89890 Pressure ulcer of other site, unstageable: Secondary | ICD-10-CM | POA: Diagnosis not present

## 2019-05-10 DIAGNOSIS — Z950 Presence of cardiac pacemaker: Secondary | ICD-10-CM | POA: Diagnosis not present

## 2019-05-10 DIAGNOSIS — E43 Unspecified severe protein-calorie malnutrition: Secondary | ICD-10-CM | POA: Diagnosis not present

## 2019-05-10 DIAGNOSIS — L89152 Pressure ulcer of sacral region, stage 2: Secondary | ICD-10-CM | POA: Diagnosis not present

## 2019-05-10 DIAGNOSIS — D649 Anemia, unspecified: Secondary | ICD-10-CM | POA: Diagnosis not present

## 2019-05-10 DIAGNOSIS — L89512 Pressure ulcer of right ankle, stage 2: Secondary | ICD-10-CM | POA: Diagnosis not present

## 2019-05-10 DIAGNOSIS — S72002D Fracture of unspecified part of neck of left femur, subsequent encounter for closed fracture with routine healing: Secondary | ICD-10-CM | POA: Diagnosis not present

## 2019-05-10 DIAGNOSIS — L89893 Pressure ulcer of other site, stage 3: Secondary | ICD-10-CM | POA: Diagnosis not present

## 2019-05-10 DIAGNOSIS — M40202 Unspecified kyphosis, cervical region: Secondary | ICD-10-CM | POA: Diagnosis not present

## 2019-05-10 DIAGNOSIS — I739 Peripheral vascular disease, unspecified: Secondary | ICD-10-CM | POA: Diagnosis not present

## 2019-05-10 DIAGNOSIS — M549 Dorsalgia, unspecified: Secondary | ICD-10-CM | POA: Diagnosis not present

## 2019-05-13 DIAGNOSIS — R627 Adult failure to thrive: Secondary | ICD-10-CM | POA: Diagnosis not present

## 2019-05-13 DIAGNOSIS — E43 Unspecified severe protein-calorie malnutrition: Secondary | ICD-10-CM | POA: Diagnosis not present

## 2019-05-13 DIAGNOSIS — M419 Scoliosis, unspecified: Secondary | ICD-10-CM | POA: Diagnosis not present

## 2019-05-13 DIAGNOSIS — M40202 Unspecified kyphosis, cervical region: Secondary | ICD-10-CM | POA: Diagnosis not present

## 2019-05-13 DIAGNOSIS — R634 Abnormal weight loss: Secondary | ICD-10-CM | POA: Diagnosis not present

## 2019-05-13 DIAGNOSIS — S72002D Fracture of unspecified part of neck of left femur, subsequent encounter for closed fracture with routine healing: Secondary | ICD-10-CM | POA: Diagnosis not present

## 2019-05-17 DIAGNOSIS — M419 Scoliosis, unspecified: Secondary | ICD-10-CM | POA: Diagnosis not present

## 2019-05-17 DIAGNOSIS — E43 Unspecified severe protein-calorie malnutrition: Secondary | ICD-10-CM | POA: Diagnosis not present

## 2019-05-17 DIAGNOSIS — M40202 Unspecified kyphosis, cervical region: Secondary | ICD-10-CM | POA: Diagnosis not present

## 2019-05-17 DIAGNOSIS — S72002D Fracture of unspecified part of neck of left femur, subsequent encounter for closed fracture with routine healing: Secondary | ICD-10-CM | POA: Diagnosis not present

## 2019-05-17 DIAGNOSIS — R627 Adult failure to thrive: Secondary | ICD-10-CM | POA: Diagnosis not present

## 2019-05-17 DIAGNOSIS — R634 Abnormal weight loss: Secondary | ICD-10-CM | POA: Diagnosis not present

## 2019-05-19 ENCOUNTER — Encounter: Payer: Medicare Other | Admitting: *Deleted

## 2019-05-20 DIAGNOSIS — R627 Adult failure to thrive: Secondary | ICD-10-CM | POA: Diagnosis not present

## 2019-05-20 DIAGNOSIS — R634 Abnormal weight loss: Secondary | ICD-10-CM | POA: Diagnosis not present

## 2019-05-20 DIAGNOSIS — M40202 Unspecified kyphosis, cervical region: Secondary | ICD-10-CM | POA: Diagnosis not present

## 2019-05-20 DIAGNOSIS — M419 Scoliosis, unspecified: Secondary | ICD-10-CM | POA: Diagnosis not present

## 2019-05-20 DIAGNOSIS — S72002D Fracture of unspecified part of neck of left femur, subsequent encounter for closed fracture with routine healing: Secondary | ICD-10-CM | POA: Diagnosis not present

## 2019-05-20 DIAGNOSIS — E43 Unspecified severe protein-calorie malnutrition: Secondary | ICD-10-CM | POA: Diagnosis not present

## 2019-05-24 DIAGNOSIS — E43 Unspecified severe protein-calorie malnutrition: Secondary | ICD-10-CM | POA: Diagnosis not present

## 2019-05-24 DIAGNOSIS — S72002D Fracture of unspecified part of neck of left femur, subsequent encounter for closed fracture with routine healing: Secondary | ICD-10-CM | POA: Diagnosis not present

## 2019-05-24 DIAGNOSIS — R634 Abnormal weight loss: Secondary | ICD-10-CM | POA: Diagnosis not present

## 2019-05-24 DIAGNOSIS — M419 Scoliosis, unspecified: Secondary | ICD-10-CM | POA: Diagnosis not present

## 2019-05-24 DIAGNOSIS — M40202 Unspecified kyphosis, cervical region: Secondary | ICD-10-CM | POA: Diagnosis not present

## 2019-05-24 DIAGNOSIS — R627 Adult failure to thrive: Secondary | ICD-10-CM | POA: Diagnosis not present

## 2019-05-27 DIAGNOSIS — R627 Adult failure to thrive: Secondary | ICD-10-CM | POA: Diagnosis not present

## 2019-05-27 DIAGNOSIS — S72002D Fracture of unspecified part of neck of left femur, subsequent encounter for closed fracture with routine healing: Secondary | ICD-10-CM | POA: Diagnosis not present

## 2019-05-27 DIAGNOSIS — E43 Unspecified severe protein-calorie malnutrition: Secondary | ICD-10-CM | POA: Diagnosis not present

## 2019-05-27 DIAGNOSIS — M40202 Unspecified kyphosis, cervical region: Secondary | ICD-10-CM | POA: Diagnosis not present

## 2019-05-27 DIAGNOSIS — R634 Abnormal weight loss: Secondary | ICD-10-CM | POA: Diagnosis not present

## 2019-05-27 DIAGNOSIS — M419 Scoliosis, unspecified: Secondary | ICD-10-CM | POA: Diagnosis not present

## 2019-05-31 DIAGNOSIS — E43 Unspecified severe protein-calorie malnutrition: Secondary | ICD-10-CM | POA: Diagnosis not present

## 2019-05-31 DIAGNOSIS — M419 Scoliosis, unspecified: Secondary | ICD-10-CM | POA: Diagnosis not present

## 2019-05-31 DIAGNOSIS — S72002D Fracture of unspecified part of neck of left femur, subsequent encounter for closed fracture with routine healing: Secondary | ICD-10-CM | POA: Diagnosis not present

## 2019-05-31 DIAGNOSIS — R627 Adult failure to thrive: Secondary | ICD-10-CM | POA: Diagnosis not present

## 2019-05-31 DIAGNOSIS — R634 Abnormal weight loss: Secondary | ICD-10-CM | POA: Diagnosis not present

## 2019-05-31 DIAGNOSIS — M40202 Unspecified kyphosis, cervical region: Secondary | ICD-10-CM | POA: Diagnosis not present

## 2019-06-03 DIAGNOSIS — M419 Scoliosis, unspecified: Secondary | ICD-10-CM | POA: Diagnosis not present

## 2019-06-03 DIAGNOSIS — S72002D Fracture of unspecified part of neck of left femur, subsequent encounter for closed fracture with routine healing: Secondary | ICD-10-CM | POA: Diagnosis not present

## 2019-06-03 DIAGNOSIS — M40202 Unspecified kyphosis, cervical region: Secondary | ICD-10-CM | POA: Diagnosis not present

## 2019-06-03 DIAGNOSIS — R634 Abnormal weight loss: Secondary | ICD-10-CM | POA: Diagnosis not present

## 2019-06-03 DIAGNOSIS — E43 Unspecified severe protein-calorie malnutrition: Secondary | ICD-10-CM | POA: Diagnosis not present

## 2019-06-03 DIAGNOSIS — R627 Adult failure to thrive: Secondary | ICD-10-CM | POA: Diagnosis not present

## 2019-06-06 ENCOUNTER — Ambulatory Visit (INDEPENDENT_AMBULATORY_CARE_PROVIDER_SITE_OTHER): Admitting: *Deleted

## 2019-06-06 DIAGNOSIS — R634 Abnormal weight loss: Secondary | ICD-10-CM | POA: Diagnosis not present

## 2019-06-06 DIAGNOSIS — R627 Adult failure to thrive: Secondary | ICD-10-CM | POA: Diagnosis not present

## 2019-06-06 DIAGNOSIS — I4891 Unspecified atrial fibrillation: Secondary | ICD-10-CM

## 2019-06-06 DIAGNOSIS — M40202 Unspecified kyphosis, cervical region: Secondary | ICD-10-CM | POA: Diagnosis not present

## 2019-06-06 DIAGNOSIS — M419 Scoliosis, unspecified: Secondary | ICD-10-CM | POA: Diagnosis not present

## 2019-06-06 DIAGNOSIS — E43 Unspecified severe protein-calorie malnutrition: Secondary | ICD-10-CM | POA: Diagnosis not present

## 2019-06-06 DIAGNOSIS — S72002D Fracture of unspecified part of neck of left femur, subsequent encounter for closed fracture with routine healing: Secondary | ICD-10-CM | POA: Diagnosis not present

## 2019-06-06 DIAGNOSIS — I495 Sick sinus syndrome: Secondary | ICD-10-CM

## 2019-06-07 DIAGNOSIS — I4891 Unspecified atrial fibrillation: Secondary | ICD-10-CM | POA: Diagnosis not present

## 2019-06-07 DIAGNOSIS — D649 Anemia, unspecified: Secondary | ICD-10-CM | POA: Diagnosis not present

## 2019-06-07 DIAGNOSIS — M419 Scoliosis, unspecified: Secondary | ICD-10-CM | POA: Diagnosis not present

## 2019-06-07 DIAGNOSIS — R634 Abnormal weight loss: Secondary | ICD-10-CM | POA: Diagnosis not present

## 2019-06-07 DIAGNOSIS — M40202 Unspecified kyphosis, cervical region: Secondary | ICD-10-CM | POA: Diagnosis not present

## 2019-06-07 DIAGNOSIS — I10 Essential (primary) hypertension: Secondary | ICD-10-CM | POA: Diagnosis not present

## 2019-06-07 DIAGNOSIS — E43 Unspecified severe protein-calorie malnutrition: Secondary | ICD-10-CM | POA: Diagnosis not present

## 2019-06-07 DIAGNOSIS — R627 Adult failure to thrive: Secondary | ICD-10-CM | POA: Diagnosis not present

## 2019-06-07 DIAGNOSIS — I739 Peripheral vascular disease, unspecified: Secondary | ICD-10-CM | POA: Diagnosis not present

## 2019-06-07 DIAGNOSIS — M542 Cervicalgia: Secondary | ICD-10-CM | POA: Diagnosis not present

## 2019-06-07 DIAGNOSIS — S72002D Fracture of unspecified part of neck of left femur, subsequent encounter for closed fracture with routine healing: Secondary | ICD-10-CM | POA: Diagnosis not present

## 2019-06-07 DIAGNOSIS — M549 Dorsalgia, unspecified: Secondary | ICD-10-CM | POA: Diagnosis not present

## 2019-06-09 DIAGNOSIS — M542 Cervicalgia: Secondary | ICD-10-CM | POA: Diagnosis not present

## 2019-06-09 DIAGNOSIS — I4891 Unspecified atrial fibrillation: Secondary | ICD-10-CM | POA: Diagnosis not present

## 2019-06-09 DIAGNOSIS — M549 Dorsalgia, unspecified: Secondary | ICD-10-CM | POA: Diagnosis not present

## 2019-06-09 DIAGNOSIS — I739 Peripheral vascular disease, unspecified: Secondary | ICD-10-CM | POA: Diagnosis not present

## 2019-06-09 DIAGNOSIS — D649 Anemia, unspecified: Secondary | ICD-10-CM | POA: Diagnosis not present

## 2019-06-09 DIAGNOSIS — E43 Unspecified severe protein-calorie malnutrition: Secondary | ICD-10-CM | POA: Diagnosis not present

## 2019-06-09 DIAGNOSIS — R229 Localized swelling, mass and lump, unspecified: Secondary | ICD-10-CM | POA: Diagnosis not present

## 2019-06-09 DIAGNOSIS — S72002D Fracture of unspecified part of neck of left femur, subsequent encounter for closed fracture with routine healing: Secondary | ICD-10-CM | POA: Diagnosis not present

## 2019-06-09 DIAGNOSIS — M40202 Unspecified kyphosis, cervical region: Secondary | ICD-10-CM | POA: Diagnosis not present

## 2019-06-09 DIAGNOSIS — L89890 Pressure ulcer of other site, unstageable: Secondary | ICD-10-CM | POA: Diagnosis not present

## 2019-06-09 DIAGNOSIS — Z681 Body mass index (BMI) 19 or less, adult: Secondary | ICD-10-CM | POA: Diagnosis not present

## 2019-06-09 DIAGNOSIS — R634 Abnormal weight loss: Secondary | ICD-10-CM | POA: Diagnosis not present

## 2019-06-09 DIAGNOSIS — R627 Adult failure to thrive: Secondary | ICD-10-CM | POA: Diagnosis not present

## 2019-06-09 DIAGNOSIS — I10 Essential (primary) hypertension: Secondary | ICD-10-CM | POA: Diagnosis not present

## 2019-06-09 DIAGNOSIS — Z96642 Presence of left artificial hip joint: Secondary | ICD-10-CM | POA: Diagnosis not present

## 2019-06-09 DIAGNOSIS — L89152 Pressure ulcer of sacral region, stage 2: Secondary | ICD-10-CM | POA: Diagnosis not present

## 2019-06-09 DIAGNOSIS — Z950 Presence of cardiac pacemaker: Secondary | ICD-10-CM | POA: Diagnosis not present

## 2019-06-09 DIAGNOSIS — L89512 Pressure ulcer of right ankle, stage 2: Secondary | ICD-10-CM | POA: Diagnosis not present

## 2019-06-09 DIAGNOSIS — M419 Scoliosis, unspecified: Secondary | ICD-10-CM | POA: Diagnosis not present

## 2019-06-09 DIAGNOSIS — L89893 Pressure ulcer of other site, stage 3: Secondary | ICD-10-CM | POA: Diagnosis not present

## 2019-06-10 DIAGNOSIS — E43 Unspecified severe protein-calorie malnutrition: Secondary | ICD-10-CM | POA: Diagnosis not present

## 2019-06-10 DIAGNOSIS — M40202 Unspecified kyphosis, cervical region: Secondary | ICD-10-CM | POA: Diagnosis not present

## 2019-06-10 DIAGNOSIS — R627 Adult failure to thrive: Secondary | ICD-10-CM | POA: Diagnosis not present

## 2019-06-10 DIAGNOSIS — M419 Scoliosis, unspecified: Secondary | ICD-10-CM | POA: Diagnosis not present

## 2019-06-10 DIAGNOSIS — S72002D Fracture of unspecified part of neck of left femur, subsequent encounter for closed fracture with routine healing: Secondary | ICD-10-CM | POA: Diagnosis not present

## 2019-06-10 DIAGNOSIS — R634 Abnormal weight loss: Secondary | ICD-10-CM | POA: Diagnosis not present

## 2019-06-12 LAB — CUP PACEART REMOTE DEVICE CHECK
Date Time Interrogation Session: 20201004202019
Implantable Lead Implant Date: 20110802
Implantable Lead Implant Date: 20110802
Implantable Lead Location: 753859
Implantable Lead Location: 753860
Implantable Pulse Generator Implant Date: 20110801

## 2019-06-14 DIAGNOSIS — E43 Unspecified severe protein-calorie malnutrition: Secondary | ICD-10-CM | POA: Diagnosis not present

## 2019-06-14 DIAGNOSIS — M419 Scoliosis, unspecified: Secondary | ICD-10-CM | POA: Diagnosis not present

## 2019-06-14 DIAGNOSIS — R634 Abnormal weight loss: Secondary | ICD-10-CM | POA: Diagnosis not present

## 2019-06-14 DIAGNOSIS — M40202 Unspecified kyphosis, cervical region: Secondary | ICD-10-CM | POA: Diagnosis not present

## 2019-06-14 DIAGNOSIS — R627 Adult failure to thrive: Secondary | ICD-10-CM | POA: Diagnosis not present

## 2019-06-14 DIAGNOSIS — S72002D Fracture of unspecified part of neck of left femur, subsequent encounter for closed fracture with routine healing: Secondary | ICD-10-CM | POA: Diagnosis not present

## 2019-06-15 DIAGNOSIS — E43 Unspecified severe protein-calorie malnutrition: Secondary | ICD-10-CM | POA: Diagnosis not present

## 2019-06-15 DIAGNOSIS — R627 Adult failure to thrive: Secondary | ICD-10-CM | POA: Diagnosis not present

## 2019-06-15 DIAGNOSIS — M419 Scoliosis, unspecified: Secondary | ICD-10-CM | POA: Diagnosis not present

## 2019-06-15 DIAGNOSIS — S72002D Fracture of unspecified part of neck of left femur, subsequent encounter for closed fracture with routine healing: Secondary | ICD-10-CM | POA: Diagnosis not present

## 2019-06-15 DIAGNOSIS — M40202 Unspecified kyphosis, cervical region: Secondary | ICD-10-CM | POA: Diagnosis not present

## 2019-06-15 DIAGNOSIS — R634 Abnormal weight loss: Secondary | ICD-10-CM | POA: Diagnosis not present

## 2019-06-15 NOTE — Addendum Note (Signed)
Addended by: Douglass Rivers D on: 06/15/2019 03:39 PM   Modules accepted: Level of Service

## 2019-06-15 NOTE — Progress Notes (Signed)
Remote pacemaker transmission.   

## 2019-06-16 DIAGNOSIS — S72002D Fracture of unspecified part of neck of left femur, subsequent encounter for closed fracture with routine healing: Secondary | ICD-10-CM | POA: Diagnosis not present

## 2019-06-16 DIAGNOSIS — R634 Abnormal weight loss: Secondary | ICD-10-CM | POA: Diagnosis not present

## 2019-06-16 DIAGNOSIS — E43 Unspecified severe protein-calorie malnutrition: Secondary | ICD-10-CM | POA: Diagnosis not present

## 2019-06-16 DIAGNOSIS — M40202 Unspecified kyphosis, cervical region: Secondary | ICD-10-CM | POA: Diagnosis not present

## 2019-06-16 DIAGNOSIS — M419 Scoliosis, unspecified: Secondary | ICD-10-CM | POA: Diagnosis not present

## 2019-06-16 DIAGNOSIS — R627 Adult failure to thrive: Secondary | ICD-10-CM | POA: Diagnosis not present

## 2019-06-17 DIAGNOSIS — M419 Scoliosis, unspecified: Secondary | ICD-10-CM | POA: Diagnosis not present

## 2019-06-17 DIAGNOSIS — R627 Adult failure to thrive: Secondary | ICD-10-CM | POA: Diagnosis not present

## 2019-06-17 DIAGNOSIS — S72002D Fracture of unspecified part of neck of left femur, subsequent encounter for closed fracture with routine healing: Secondary | ICD-10-CM | POA: Diagnosis not present

## 2019-06-17 DIAGNOSIS — E43 Unspecified severe protein-calorie malnutrition: Secondary | ICD-10-CM | POA: Diagnosis not present

## 2019-06-17 DIAGNOSIS — M40202 Unspecified kyphosis, cervical region: Secondary | ICD-10-CM | POA: Diagnosis not present

## 2019-06-17 DIAGNOSIS — R634 Abnormal weight loss: Secondary | ICD-10-CM | POA: Diagnosis not present

## 2019-06-20 ENCOUNTER — Ambulatory Visit (INDEPENDENT_AMBULATORY_CARE_PROVIDER_SITE_OTHER): Payer: Medicare Other | Admitting: *Deleted

## 2019-06-20 DIAGNOSIS — I4891 Unspecified atrial fibrillation: Secondary | ICD-10-CM

## 2019-06-20 DIAGNOSIS — M419 Scoliosis, unspecified: Secondary | ICD-10-CM | POA: Diagnosis not present

## 2019-06-20 DIAGNOSIS — I495 Sick sinus syndrome: Secondary | ICD-10-CM

## 2019-06-20 DIAGNOSIS — M40202 Unspecified kyphosis, cervical region: Secondary | ICD-10-CM | POA: Diagnosis not present

## 2019-06-20 DIAGNOSIS — R627 Adult failure to thrive: Secondary | ICD-10-CM | POA: Diagnosis not present

## 2019-06-20 DIAGNOSIS — S72002D Fracture of unspecified part of neck of left femur, subsequent encounter for closed fracture with routine healing: Secondary | ICD-10-CM | POA: Diagnosis not present

## 2019-06-20 DIAGNOSIS — E43 Unspecified severe protein-calorie malnutrition: Secondary | ICD-10-CM | POA: Diagnosis not present

## 2019-06-20 DIAGNOSIS — R634 Abnormal weight loss: Secondary | ICD-10-CM | POA: Diagnosis not present

## 2019-06-21 DIAGNOSIS — M419 Scoliosis, unspecified: Secondary | ICD-10-CM | POA: Diagnosis not present

## 2019-06-21 DIAGNOSIS — R627 Adult failure to thrive: Secondary | ICD-10-CM | POA: Diagnosis not present

## 2019-06-21 DIAGNOSIS — R634 Abnormal weight loss: Secondary | ICD-10-CM | POA: Diagnosis not present

## 2019-06-21 DIAGNOSIS — E43 Unspecified severe protein-calorie malnutrition: Secondary | ICD-10-CM | POA: Diagnosis not present

## 2019-06-21 DIAGNOSIS — M40202 Unspecified kyphosis, cervical region: Secondary | ICD-10-CM | POA: Diagnosis not present

## 2019-06-21 DIAGNOSIS — S72002D Fracture of unspecified part of neck of left femur, subsequent encounter for closed fracture with routine healing: Secondary | ICD-10-CM | POA: Diagnosis not present

## 2019-06-23 ENCOUNTER — Telehealth: Payer: Self-pay | Admitting: Internal Medicine

## 2019-06-23 DIAGNOSIS — R627 Adult failure to thrive: Secondary | ICD-10-CM | POA: Diagnosis not present

## 2019-06-23 DIAGNOSIS — M419 Scoliosis, unspecified: Secondary | ICD-10-CM | POA: Diagnosis not present

## 2019-06-23 DIAGNOSIS — M40202 Unspecified kyphosis, cervical region: Secondary | ICD-10-CM | POA: Diagnosis not present

## 2019-06-23 DIAGNOSIS — R634 Abnormal weight loss: Secondary | ICD-10-CM | POA: Diagnosis not present

## 2019-06-23 DIAGNOSIS — E43 Unspecified severe protein-calorie malnutrition: Secondary | ICD-10-CM | POA: Diagnosis not present

## 2019-06-23 DIAGNOSIS — S72002D Fracture of unspecified part of neck of left femur, subsequent encounter for closed fracture with routine healing: Secondary | ICD-10-CM | POA: Diagnosis not present

## 2019-06-23 NOTE — Telephone Encounter (Signed)
New message:    Patient wife calling stating that she do not understand the letter that was sent out to them concering patient device. Please call patient.

## 2019-06-23 NOTE — Telephone Encounter (Signed)
I spoke to the patient's wife who is confused about home remote transmissions.  I tried to explain, but she would like a phone call explaining the timing.  She said that they just sent a transmission and wanted to know if it was received.  Thank you

## 2019-06-23 NOTE — Telephone Encounter (Signed)
I spoke with the pt wife and she states he is under hospice care. I let her know we monitoring his pacemaker monthly to keep an eye on his battery. Once his battery reached the RRT the nurse will give her a call to discuss replacing the battery. I told her once the nurse call her they can discuss if it is best for him to replace the battery since he is on hospice care. The pt verbalized understanding.

## 2019-06-24 DIAGNOSIS — E43 Unspecified severe protein-calorie malnutrition: Secondary | ICD-10-CM | POA: Diagnosis not present

## 2019-06-24 DIAGNOSIS — R634 Abnormal weight loss: Secondary | ICD-10-CM | POA: Diagnosis not present

## 2019-06-24 DIAGNOSIS — M40202 Unspecified kyphosis, cervical region: Secondary | ICD-10-CM | POA: Diagnosis not present

## 2019-06-24 DIAGNOSIS — R627 Adult failure to thrive: Secondary | ICD-10-CM | POA: Diagnosis not present

## 2019-06-24 DIAGNOSIS — S72002D Fracture of unspecified part of neck of left femur, subsequent encounter for closed fracture with routine healing: Secondary | ICD-10-CM | POA: Diagnosis not present

## 2019-06-24 DIAGNOSIS — M419 Scoliosis, unspecified: Secondary | ICD-10-CM | POA: Diagnosis not present

## 2019-06-26 LAB — CUP PACEART REMOTE DEVICE CHECK
Date Time Interrogation Session: 20201018161359
Implantable Lead Implant Date: 20110802
Implantable Lead Implant Date: 20110802
Implantable Lead Location: 753859
Implantable Lead Location: 753860
Implantable Pulse Generator Implant Date: 20110801

## 2019-06-27 ENCOUNTER — Telehealth: Payer: Self-pay | Admitting: *Deleted

## 2019-06-27 DIAGNOSIS — R634 Abnormal weight loss: Secondary | ICD-10-CM | POA: Diagnosis not present

## 2019-06-27 DIAGNOSIS — S72002D Fracture of unspecified part of neck of left femur, subsequent encounter for closed fracture with routine healing: Secondary | ICD-10-CM | POA: Diagnosis not present

## 2019-06-27 DIAGNOSIS — R627 Adult failure to thrive: Secondary | ICD-10-CM | POA: Diagnosis not present

## 2019-06-27 DIAGNOSIS — M40202 Unspecified kyphosis, cervical region: Secondary | ICD-10-CM | POA: Diagnosis not present

## 2019-06-27 DIAGNOSIS — M419 Scoliosis, unspecified: Secondary | ICD-10-CM | POA: Diagnosis not present

## 2019-06-27 DIAGNOSIS — E43 Unspecified severe protein-calorie malnutrition: Secondary | ICD-10-CM | POA: Diagnosis not present

## 2019-06-27 NOTE — Telephone Encounter (Signed)
Per 06/20/19 monthly battery check, PPM battery voltage currently 2.83V (RRT at 2.81V). PPM programmed AAIR due to RV lead perforation/dislodgment per notes. Historically A-paced 100% when in sinus (underlying AP @ 30bpm per [employer] note from 11/2017), though increasing AF burden is noted on his 10/15 transmission, now A-pacing only 34.2%. Confirmed with [employer] tech services that PPM will still revert to VVI 65 at RRT, though the mode is reprogrammable.  Spoke with patient's wife, [name] (DPR). [name] reports patient's quality of life continues to decline. Patient now immobile, bed-bound, receiving hospice care. Taking morphine so rational conversation is limited per wife. Explained to [name] that patient is not pacemaker-dependent while in AF, but is at least intermittently atrially-dependent while in sinus rhythm. Explained pacing mode change and loss of atrial pacing with reversion to RRT. Offered telemedicine visit with Dr. Rayann Heman to discuss further regarding options at this time. [name] is agreeable to this plan and is aware a scheduler will call to schedule. She denies any further questions at this time.

## 2019-06-28 DIAGNOSIS — E43 Unspecified severe protein-calorie malnutrition: Secondary | ICD-10-CM | POA: Diagnosis not present

## 2019-06-28 DIAGNOSIS — R634 Abnormal weight loss: Secondary | ICD-10-CM | POA: Diagnosis not present

## 2019-06-28 DIAGNOSIS — M419 Scoliosis, unspecified: Secondary | ICD-10-CM | POA: Diagnosis not present

## 2019-06-28 DIAGNOSIS — M40202 Unspecified kyphosis, cervical region: Secondary | ICD-10-CM | POA: Diagnosis not present

## 2019-06-28 DIAGNOSIS — S72002D Fracture of unspecified part of neck of left femur, subsequent encounter for closed fracture with routine healing: Secondary | ICD-10-CM | POA: Diagnosis not present

## 2019-06-28 DIAGNOSIS — R627 Adult failure to thrive: Secondary | ICD-10-CM | POA: Diagnosis not present

## 2019-06-29 ENCOUNTER — Encounter: Payer: Self-pay | Admitting: Internal Medicine

## 2019-06-29 ENCOUNTER — Telehealth (INDEPENDENT_AMBULATORY_CARE_PROVIDER_SITE_OTHER): Admitting: Internal Medicine

## 2019-06-29 ENCOUNTER — Other Ambulatory Visit: Payer: Self-pay

## 2019-06-29 VITALS — Ht 66.0 in

## 2019-06-29 DIAGNOSIS — M419 Scoliosis, unspecified: Secondary | ICD-10-CM | POA: Diagnosis not present

## 2019-06-29 DIAGNOSIS — E43 Unspecified severe protein-calorie malnutrition: Secondary | ICD-10-CM | POA: Diagnosis not present

## 2019-06-29 DIAGNOSIS — S72002D Fracture of unspecified part of neck of left femur, subsequent encounter for closed fracture with routine healing: Secondary | ICD-10-CM | POA: Diagnosis not present

## 2019-06-29 DIAGNOSIS — R634 Abnormal weight loss: Secondary | ICD-10-CM | POA: Diagnosis not present

## 2019-06-29 DIAGNOSIS — M40202 Unspecified kyphosis, cervical region: Secondary | ICD-10-CM | POA: Diagnosis not present

## 2019-06-29 DIAGNOSIS — I480 Paroxysmal atrial fibrillation: Secondary | ICD-10-CM

## 2019-06-29 DIAGNOSIS — I495 Sick sinus syndrome: Secondary | ICD-10-CM | POA: Diagnosis not present

## 2019-06-29 DIAGNOSIS — R627 Adult failure to thrive: Secondary | ICD-10-CM | POA: Diagnosis not present

## 2019-06-29 MED ORDER — APIXABAN 5 MG PO TABS: 5.0000 mg | ORAL_TABLET | Freq: Two times a day (BID) | ORAL | 12 refills | Status: AC

## 2019-06-29 NOTE — Progress Notes (Signed)
Electrophysiology TeleHealth Note   Due to national recommendations of social distancing due to COVID 19, an audio telehealth visit is felt to be most appropriate for this patient at this time.  Verbal consent was obtained by me for the telehealth visit today.  The patient does not have capability for a virtual visit.  A phone visit is therefore required today.   Date:  06/29/2019   ID:  [name], [name] 30-Apr-1929, MRN [id number]  Location: patient's home  Provider location:  [facility]  Evaluation Performed: Follow-up visit  PCP:  Tisovec, Adelfa Koh, MD   Electrophysiologist:  Dr [name]  Chief Complaint:  Pacemaker follow up  History of Present Illness:    [name] is a 85 y.o. male who presents via telehealth conferencing today.  He is currently in hospice care and requiring morphine. He is not able to talk by phone today. Visit today is done with the help of his wife. Since last being seen in our clinic, the patient reports ongoing decline. He is in hospice care, bedbound and confused.   ROS is unable to be obtained. The patient denies symptoms of fevers, chills, cough, or new SOB worrisome for COVID 19.  Past Medical History:  Diagnosis Date  . Atrial tachycardia (HCC)   . DDD (degenerative disc disease)   . DJD (degenerative joint disease)   . GERD (gastroesophageal reflux disease)   . Nocturia   . PAF (paroxysmal atrial fibrillation) (HCC)    on Coumadin  . Phlebitis   . Scoliosis   . Spinal stenosis   . Tachycardia-bradycardia syndrome (HCC)    s/p PPM by ST 04/08/10, RV lead has dislodged but  appears stable AAIR    Past Surgical History:  Procedure Laterality Date  . HIP ARTHROPLASTY Left 10/22/2018   Procedure: ARTHROPLASTY BIPOLAR HIP (HEMIARTHROPLASTY);  Surgeon: Myrene Galas, MD;  Location: [facility] OR;  Service: Orthopedics;  Laterality: Left;  . INGUINAL HERNIA REPAIR  1992  . PACEMAKER INSERTION  04/08/10   [employer] - Revo - implanted by  Dr. Deborah Chalk 04/08/10  RV lead has dislodged, though pt is stable AAIR    Current Outpatient Medications  Medication Sig Dispense Refill  . acetaminophen (TYLENOL) 500 MG tablet Take 2 tablets (1,000 mg total) by mouth 3 (three) times daily.    Marland Kitchen amiodarone (PACERONE) 200 MG tablet Take 1 tablet (200 mg total) by mouth 2 (two) times daily.    Marland Kitchen apixaban (ELIQUIS) 5 MG TABS tablet Take 1 tablet (5 mg total) by mouth 2 (two) times daily.    . Ascorbic Acid 500 MG CAPS Take 500 mg by mouth 2 (two) times daily.    . bisacodyl (DULCOLAX) 10 MG suppository Place 1 suppository (10 mg total) rectally daily as needed for moderate constipation.    . Coenzyme Q10 (CO Q 10 PO) Take 100 mg by mouth 2 (two) times daily.     . collagenase (SANTYL) ointment Apply topically daily. Apply Santyl to right posterior rib area Q day, then cover with moist 2X2 and foam dressing.  (Change foam dressing Q 3 days or PRN soiling.)    . feeding supplement, ENSURE ENLIVE, (ENSURE ENLIVE) LIQD Take 237 mLs by mouth 3 (three) times daily between meals.    . fish oil-omega-3 fatty acids 1000 MG capsule Take 2 g by mouth daily.      . metoprolol tartrate (LOPRESSOR) 50 MG tablet Take 1 tablet (50 mg total) by mouth 2 (two)  times daily.    . Multiple Vitamin (MULTIVITAMIN) tablet Take 1 tablet by mouth daily.     . polyethylene glycol (MIRALAX / GLYCOLAX) packet Take 17 g by mouth daily.    . predniSONE (DELTASONE) 10 MG tablet 3 tabs daily for 3 days, then 2 tabs daily for 3 days, then 1 tab daily for 3 days, then stop.    Marland Kitchen senna (SENOKOT) 8.6 MG TABS tablet Take 2 tablets (17.2 mg total) by mouth daily.    Marland Kitchen VITAMIN D, CHOLECALCIFEROL, PO Take 5,000 Units by mouth daily.     Marland Kitchen VITAMIN E PO Take 400 Units by mouth daily.      No current facility-administered medications for this visit.     Allergies:   Patient has no known allergies.   Social History:  The patient  reports that he has never smoked. He has never used  smokeless tobacco. He reports that he does not drink alcohol or use drugs.   Family History:  The patient's  family history includes Cancer in his father and another family member.   ROS:  Please see the history of present illness.   All other systems are personally reviewed and negative.    Exam:    Vital Signs:  Ht 5\' 6"  (1.676 m)   BMI 28.57 kg/m   Spoke with wife as patient is in hospice care and currently receiving morphine  Labs/Other Tests and Data Reviewed:    Recent Labs: 10/27/2018: ALT 24; BUN 23; Hemoglobin 10.4; Magnesium 1.8; Platelets 215; Potassium 4.1; Sodium 135 10/29/2018: Creatinine, Ser 0.66   Wt Readings from Last 3 Encounters:  10/30/18 177 lb (80.3 kg)  05/16/16 153 lb (69.4 kg)  10/30/15 126 lb (57.2 kg)     Last device remote is reviewed from [place] PDF which reveals normal device function, nearing ERI   ASSESSMENT & PLAN:    1.  Symptomatic sinus bradycardia  His pacemaker is nearing ERI He is intermittently atrially dependent with prior RV lead perforation/dislodgement He is currently programmed AAIR. When he reaches ERI, he will revert to VVI 65.  Discussed at length with wife today.  She is very clear that he would not want to go through a procedure or prolong his life with his current quality of life. Will plan conservative care and not follow pacemaker going forward at this time.  She is clear that his wishes are to be DNR.   2.  Paroxysmal atrial fibrillation AF burden is stable Continue Eliquis and amiodarone    Follow-up:  With me prn   Patient Risk:  after full review of this patients clinical status, I feel that they are at moderate risk at this time.  Today, I have spent 15 minutes with the patient with telehealth technology discussing arrhythmia management .    SignedThompson Grayer, MD  06/29/2019 3:56 PM     [place] [address] [phone] (office) [phone]  (fax)

## 2019-06-30 ENCOUNTER — Telehealth: Payer: Self-pay

## 2019-06-30 DIAGNOSIS — R627 Adult failure to thrive: Secondary | ICD-10-CM | POA: Diagnosis not present

## 2019-06-30 DIAGNOSIS — E43 Unspecified severe protein-calorie malnutrition: Secondary | ICD-10-CM | POA: Diagnosis not present

## 2019-06-30 DIAGNOSIS — R634 Abnormal weight loss: Secondary | ICD-10-CM | POA: Diagnosis not present

## 2019-06-30 DIAGNOSIS — S72002D Fracture of unspecified part of neck of left femur, subsequent encounter for closed fracture with routine healing: Secondary | ICD-10-CM | POA: Diagnosis not present

## 2019-06-30 DIAGNOSIS — M40202 Unspecified kyphosis, cervical region: Secondary | ICD-10-CM | POA: Diagnosis not present

## 2019-06-30 DIAGNOSIS — M419 Scoliosis, unspecified: Secondary | ICD-10-CM | POA: Diagnosis not present

## 2019-06-30 NOTE — Addendum Note (Signed)
Addended by: Douglass Rivers D on: 06/30/2019 03:29 PM   Modules accepted: Level of Service

## 2019-06-30 NOTE — Telephone Encounter (Signed)
I called pt to let her know I got Dr. Rayann Heman message pt will stop remote monitoring. I was going to ask her to unplug the pt monitor and let her know I un-enrolled the pt in [facility] as well.

## 2019-06-30 NOTE — Progress Notes (Signed)
Remote pacemaker transmission.   

## 2019-07-01 DIAGNOSIS — M419 Scoliosis, unspecified: Secondary | ICD-10-CM | POA: Diagnosis not present

## 2019-07-01 DIAGNOSIS — S72002D Fracture of unspecified part of neck of left femur, subsequent encounter for closed fracture with routine healing: Secondary | ICD-10-CM | POA: Diagnosis not present

## 2019-07-01 DIAGNOSIS — R634 Abnormal weight loss: Secondary | ICD-10-CM | POA: Diagnosis not present

## 2019-07-01 DIAGNOSIS — E43 Unspecified severe protein-calorie malnutrition: Secondary | ICD-10-CM | POA: Diagnosis not present

## 2019-07-01 DIAGNOSIS — M40202 Unspecified kyphosis, cervical region: Secondary | ICD-10-CM | POA: Diagnosis not present

## 2019-07-01 DIAGNOSIS — R627 Adult failure to thrive: Secondary | ICD-10-CM | POA: Diagnosis not present

## 2019-07-02 DIAGNOSIS — S72002D Fracture of unspecified part of neck of left femur, subsequent encounter for closed fracture with routine healing: Secondary | ICD-10-CM | POA: Diagnosis not present

## 2019-07-02 DIAGNOSIS — E43 Unspecified severe protein-calorie malnutrition: Secondary | ICD-10-CM | POA: Diagnosis not present

## 2019-07-02 DIAGNOSIS — M40202 Unspecified kyphosis, cervical region: Secondary | ICD-10-CM | POA: Diagnosis not present

## 2019-07-02 DIAGNOSIS — M419 Scoliosis, unspecified: Secondary | ICD-10-CM | POA: Diagnosis not present

## 2019-07-02 DIAGNOSIS — R627 Adult failure to thrive: Secondary | ICD-10-CM | POA: Diagnosis not present

## 2019-07-02 DIAGNOSIS — R634 Abnormal weight loss: Secondary | ICD-10-CM | POA: Diagnosis not present

## 2019-07-04 DIAGNOSIS — R634 Abnormal weight loss: Secondary | ICD-10-CM | POA: Diagnosis not present

## 2019-07-04 DIAGNOSIS — M40202 Unspecified kyphosis, cervical region: Secondary | ICD-10-CM | POA: Diagnosis not present

## 2019-07-04 DIAGNOSIS — E43 Unspecified severe protein-calorie malnutrition: Secondary | ICD-10-CM | POA: Diagnosis not present

## 2019-07-04 DIAGNOSIS — M419 Scoliosis, unspecified: Secondary | ICD-10-CM | POA: Diagnosis not present

## 2019-07-04 DIAGNOSIS — R627 Adult failure to thrive: Secondary | ICD-10-CM | POA: Diagnosis not present

## 2019-07-04 DIAGNOSIS — S72002D Fracture of unspecified part of neck of left femur, subsequent encounter for closed fracture with routine healing: Secondary | ICD-10-CM | POA: Diagnosis not present

## 2019-07-05 DIAGNOSIS — R627 Adult failure to thrive: Secondary | ICD-10-CM | POA: Diagnosis not present

## 2019-07-05 DIAGNOSIS — E43 Unspecified severe protein-calorie malnutrition: Secondary | ICD-10-CM | POA: Diagnosis not present

## 2019-07-05 DIAGNOSIS — S72002D Fracture of unspecified part of neck of left femur, subsequent encounter for closed fracture with routine healing: Secondary | ICD-10-CM | POA: Diagnosis not present

## 2019-07-05 DIAGNOSIS — M40202 Unspecified kyphosis, cervical region: Secondary | ICD-10-CM | POA: Diagnosis not present

## 2019-07-05 DIAGNOSIS — M419 Scoliosis, unspecified: Secondary | ICD-10-CM | POA: Diagnosis not present

## 2019-07-05 DIAGNOSIS — R634 Abnormal weight loss: Secondary | ICD-10-CM | POA: Diagnosis not present

## 2019-07-06 DIAGNOSIS — R627 Adult failure to thrive: Secondary | ICD-10-CM | POA: Diagnosis not present

## 2019-07-06 DIAGNOSIS — R634 Abnormal weight loss: Secondary | ICD-10-CM | POA: Diagnosis not present

## 2019-07-06 DIAGNOSIS — M40202 Unspecified kyphosis, cervical region: Secondary | ICD-10-CM | POA: Diagnosis not present

## 2019-07-06 DIAGNOSIS — S72002D Fracture of unspecified part of neck of left femur, subsequent encounter for closed fracture with routine healing: Secondary | ICD-10-CM | POA: Diagnosis not present

## 2019-07-06 DIAGNOSIS — M419 Scoliosis, unspecified: Secondary | ICD-10-CM | POA: Diagnosis not present

## 2019-07-06 DIAGNOSIS — E43 Unspecified severe protein-calorie malnutrition: Secondary | ICD-10-CM | POA: Diagnosis not present

## 2019-07-07 DIAGNOSIS — E43 Unspecified severe protein-calorie malnutrition: Secondary | ICD-10-CM | POA: Diagnosis not present

## 2019-07-07 DIAGNOSIS — M40202 Unspecified kyphosis, cervical region: Secondary | ICD-10-CM | POA: Diagnosis not present

## 2019-07-07 DIAGNOSIS — R634 Abnormal weight loss: Secondary | ICD-10-CM | POA: Diagnosis not present

## 2019-07-07 DIAGNOSIS — S72002D Fracture of unspecified part of neck of left femur, subsequent encounter for closed fracture with routine healing: Secondary | ICD-10-CM | POA: Diagnosis not present

## 2019-07-07 DIAGNOSIS — R627 Adult failure to thrive: Secondary | ICD-10-CM | POA: Diagnosis not present

## 2019-07-07 DIAGNOSIS — M419 Scoliosis, unspecified: Secondary | ICD-10-CM | POA: Diagnosis not present

## 2019-07-10 DEATH — deceased

## 2019-07-11 ENCOUNTER — Telehealth: Payer: Self-pay | Admitting: Internal Medicine

## 2019-07-11 NOTE — Telephone Encounter (Signed)
Pt wife states the pt past away 21-Jul-2019. I let the pt know I will order her a return kit for his home remote monitor.

## 2019-07-11 NOTE — Telephone Encounter (Signed)
New Message:   Wife anted Dr Lamount Cohen to know that pt passed away on July 23, 2019
